# Patient Record
Sex: Female | Born: 1945
Health system: Southern US, Community
[De-identification: ages and names within clinical notes are randomized; demographics above are authoritative.]

## PROBLEM LIST (undated history)

## (undated) DIAGNOSIS — Z9289 Personal history of other medical treatment: Secondary | ICD-10-CM

## (undated) DIAGNOSIS — I10 Essential (primary) hypertension: Secondary | ICD-10-CM

## (undated) DIAGNOSIS — M810 Age-related osteoporosis without current pathological fracture: Secondary | ICD-10-CM

## (undated) DIAGNOSIS — E119 Type 2 diabetes mellitus without complications: Secondary | ICD-10-CM

## (undated) DIAGNOSIS — E7800 Pure hypercholesterolemia, unspecified: Secondary | ICD-10-CM

## (undated) DIAGNOSIS — M549 Dorsalgia, unspecified: Secondary | ICD-10-CM

## (undated) DIAGNOSIS — M1990 Unspecified osteoarthritis, unspecified site: Secondary | ICD-10-CM

## (undated) DIAGNOSIS — Z5189 Encounter for other specified aftercare: Secondary | ICD-10-CM

## (undated) DIAGNOSIS — R9439 Abnormal result of other cardiovascular function study: Secondary | ICD-10-CM

## (undated) DIAGNOSIS — E039 Hypothyroidism, unspecified: Secondary | ICD-10-CM

## (undated) DIAGNOSIS — K449 Diaphragmatic hernia without obstruction or gangrene: Secondary | ICD-10-CM

## (undated) DIAGNOSIS — F419 Anxiety disorder, unspecified: Secondary | ICD-10-CM

## (undated) DIAGNOSIS — I2510 Atherosclerotic heart disease of native coronary artery without angina pectoris: Secondary | ICD-10-CM

## (undated) DIAGNOSIS — M797 Fibromyalgia: Secondary | ICD-10-CM

## (undated) DIAGNOSIS — H269 Unspecified cataract: Secondary | ICD-10-CM

## (undated) DIAGNOSIS — D649 Anemia, unspecified: Secondary | ICD-10-CM

## (undated) DIAGNOSIS — G8929 Other chronic pain: Secondary | ICD-10-CM

## (undated) HISTORY — DX: Personal history of other medical treatment: Z92.89

## (undated) HISTORY — PX: CARPAL TUNNEL RELEASE: SHX101

## (undated) HISTORY — PX: CATARACT EXTRACTION: SUR2

## (undated) HISTORY — PX: EYE SURGERY: SHX253

## (undated) HISTORY — DX: Unspecified cataract: H26.9

## (undated) HISTORY — DX: Abnormal result of other cardiovascular function study: R94.39

## (undated) HISTORY — DX: Diaphragmatic hernia without obstruction or gangrene: K44.9

## (undated) HISTORY — PX: C-EYE SURGERY PROCEDURE: 102257504

## (undated) HISTORY — DX: Fibromyalgia: M79.7

## (undated) HISTORY — PX: ABDOMINAL HYSTERECTOMY: SHX81

## (undated) HISTORY — DX: Type 2 diabetes mellitus without complications: E11.9

## (undated) HISTORY — DX: Essential (primary) hypertension: I10

## (undated) HISTORY — DX: Hypothyroidism, unspecified: E03.9

## (undated) HISTORY — PX: BACK SURGERY: SHX140

## (undated) HISTORY — DX: Unspecified osteoarthritis, unspecified site: M19.90

## (undated) HISTORY — PX: TOTAL HIP REVISION: SHX763

## (undated) HISTORY — PX: COLONOSCOPY: SHX174

## (undated) HISTORY — PX: TONSILLECTOMY: SUR1361

## (undated) HISTORY — DX: Age-related osteoporosis without current pathological fracture: M81.0

## (undated) HISTORY — PX: CHOLECYSTECTOMY: SHX55

---
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
## 2000-07-16 ENCOUNTER — Encounter (HOSPITAL_COMMUNITY): Admission: RE | Admit: 2000-07-16 | Discharge: 2000-08-15 | Payer: Self-pay | Admitting: Rheumatology

## 2001-05-21 ENCOUNTER — Encounter: Payer: Self-pay | Admitting: *Deleted

## 2001-05-21 ENCOUNTER — Ambulatory Visit (HOSPITAL_COMMUNITY): Admission: RE | Admit: 2001-05-21 | Discharge: 2001-05-21 | Payer: Self-pay | Admitting: *Deleted

## 2001-06-03 ENCOUNTER — Encounter (HOSPITAL_COMMUNITY): Admission: RE | Admit: 2001-06-03 | Discharge: 2001-07-03 | Payer: Self-pay | Admitting: Rheumatology

## 2001-07-22 ENCOUNTER — Encounter (HOSPITAL_COMMUNITY): Admission: RE | Admit: 2001-07-22 | Discharge: 2001-08-21 | Payer: Self-pay | Admitting: Rheumatology

## 2001-10-14 ENCOUNTER — Encounter (HOSPITAL_COMMUNITY): Admission: RE | Admit: 2001-10-14 | Discharge: 2001-11-13 | Payer: Self-pay | Admitting: Rheumatology

## 2001-12-16 ENCOUNTER — Encounter (HOSPITAL_COMMUNITY): Admission: RE | Admit: 2001-12-16 | Discharge: 2002-01-15 | Payer: Self-pay | Admitting: Rheumatology

## 2002-04-11 ENCOUNTER — Encounter: Payer: Self-pay | Admitting: Family Medicine

## 2002-04-11 ENCOUNTER — Ambulatory Visit (HOSPITAL_COMMUNITY): Admission: RE | Admit: 2002-04-11 | Discharge: 2002-04-11 | Payer: Self-pay | Admitting: Family Medicine

## 2002-05-12 ENCOUNTER — Encounter (HOSPITAL_COMMUNITY): Admission: RE | Admit: 2002-05-12 | Discharge: 2002-06-11 | Payer: Self-pay | Admitting: Rheumatology

## 2002-06-28 ENCOUNTER — Ambulatory Visit (HOSPITAL_COMMUNITY): Admission: RE | Admit: 2002-06-28 | Discharge: 2002-06-28 | Payer: Self-pay | Admitting: Pulmonary Disease

## 2002-07-04 ENCOUNTER — Ambulatory Visit (HOSPITAL_COMMUNITY): Admission: RE | Admit: 2002-07-04 | Discharge: 2002-07-04 | Payer: Self-pay | Admitting: Pulmonary Disease

## 2002-07-19 ENCOUNTER — Ambulatory Visit (HOSPITAL_COMMUNITY): Admission: RE | Admit: 2002-07-19 | Discharge: 2002-07-19 | Payer: Self-pay | Admitting: Pulmonary Disease

## 2002-07-27 ENCOUNTER — Ambulatory Visit (HOSPITAL_COMMUNITY): Admission: RE | Admit: 2002-07-27 | Discharge: 2002-07-27 | Payer: Self-pay | Admitting: Pulmonary Disease

## 2002-08-25 ENCOUNTER — Encounter (HOSPITAL_COMMUNITY): Admission: RE | Admit: 2002-08-25 | Discharge: 2002-09-24 | Payer: Self-pay | Admitting: Rheumatology

## 2002-09-01 ENCOUNTER — Encounter (HOSPITAL_COMMUNITY): Admission: RE | Admit: 2002-09-01 | Discharge: 2002-10-01 | Payer: Self-pay | Admitting: Rheumatology

## 2002-09-27 ENCOUNTER — Encounter (HOSPITAL_COMMUNITY): Admission: RE | Admit: 2002-09-27 | Discharge: 2002-10-27 | Payer: Self-pay | Admitting: Rheumatology

## 2002-10-07 ENCOUNTER — Encounter (INDEPENDENT_AMBULATORY_CARE_PROVIDER_SITE_OTHER): Payer: Self-pay | Admitting: Internal Medicine

## 2002-10-07 ENCOUNTER — Ambulatory Visit (HOSPITAL_COMMUNITY): Admission: RE | Admit: 2002-10-07 | Discharge: 2002-10-07 | Payer: Self-pay | Admitting: Internal Medicine

## 2002-10-17 ENCOUNTER — Encounter: Payer: Self-pay | Admitting: Orthopedic Surgery

## 2002-10-17 ENCOUNTER — Ambulatory Visit (HOSPITAL_COMMUNITY): Admission: RE | Admit: 2002-10-17 | Discharge: 2002-10-17 | Payer: Self-pay | Admitting: Orthopedic Surgery

## 2002-10-25 ENCOUNTER — Encounter: Payer: Self-pay | Admitting: Orthopedic Surgery

## 2002-10-25 ENCOUNTER — Inpatient Hospital Stay (HOSPITAL_COMMUNITY): Admission: RE | Admit: 2002-10-25 | Discharge: 2002-10-29 | Payer: Self-pay | Admitting: Orthopedic Surgery

## 2002-10-26 ENCOUNTER — Encounter: Payer: Self-pay | Admitting: Orthopedic Surgery

## 2002-12-12 ENCOUNTER — Ambulatory Visit: Admission: RE | Admit: 2002-12-12 | Discharge: 2002-12-12 | Payer: Self-pay | Admitting: Orthopedic Surgery

## 2002-12-12 ENCOUNTER — Encounter: Payer: Self-pay | Admitting: Orthopedic Surgery

## 2003-01-30 ENCOUNTER — Ambulatory Visit (HOSPITAL_COMMUNITY): Admission: RE | Admit: 2003-01-30 | Discharge: 2003-01-30 | Payer: Self-pay | Admitting: Orthopedic Surgery

## 2003-03-09 ENCOUNTER — Other Ambulatory Visit: Admission: RE | Admit: 2003-03-09 | Discharge: 2003-03-09 | Payer: Self-pay | Admitting: Unknown Physician Specialty

## 2003-04-04 ENCOUNTER — Ambulatory Visit (HOSPITAL_COMMUNITY): Admission: RE | Admit: 2003-04-04 | Discharge: 2003-04-04 | Payer: Self-pay | Admitting: Pulmonary Disease

## 2003-10-16 ENCOUNTER — Ambulatory Visit (HOSPITAL_COMMUNITY): Admission: RE | Admit: 2003-10-16 | Discharge: 2003-10-16 | Payer: Self-pay | Admitting: Otolaryngology

## 2003-11-01 ENCOUNTER — Ambulatory Visit (HOSPITAL_COMMUNITY): Admission: RE | Admit: 2003-11-01 | Discharge: 2003-11-01 | Payer: Self-pay | Admitting: Pulmonary Disease

## 2004-04-11 ENCOUNTER — Ambulatory Visit: Payer: Self-pay | Admitting: Orthopedic Surgery

## 2004-04-18 ENCOUNTER — Ambulatory Visit (HOSPITAL_COMMUNITY): Admission: RE | Admit: 2004-04-18 | Discharge: 2004-04-18 | Payer: Self-pay | Admitting: Orthopedic Surgery

## 2004-04-24 ENCOUNTER — Ambulatory Visit: Payer: Self-pay | Admitting: Orthopedic Surgery

## 2004-10-21 ENCOUNTER — Ambulatory Visit: Payer: Self-pay | Admitting: Orthopedic Surgery

## 2005-01-20 ENCOUNTER — Ambulatory Visit: Payer: Self-pay | Admitting: Orthopedic Surgery

## 2005-02-25 ENCOUNTER — Ambulatory Visit (HOSPITAL_COMMUNITY): Admission: RE | Admit: 2005-02-25 | Discharge: 2005-02-25 | Payer: Self-pay | Admitting: Pulmonary Disease

## 2005-06-25 ENCOUNTER — Ambulatory Visit: Payer: Self-pay | Admitting: Orthopedic Surgery

## 2005-07-28 ENCOUNTER — Ambulatory Visit: Payer: Self-pay | Admitting: Orthopedic Surgery

## 2005-09-15 ENCOUNTER — Ambulatory Visit: Payer: Self-pay | Admitting: Orthopedic Surgery

## 2005-10-22 ENCOUNTER — Ambulatory Visit: Payer: Self-pay | Admitting: Orthopedic Surgery

## 2006-01-19 ENCOUNTER — Ambulatory Visit (HOSPITAL_COMMUNITY): Admission: RE | Admit: 2006-01-19 | Discharge: 2006-01-19 | Payer: Self-pay | Admitting: *Deleted

## 2006-01-19 ENCOUNTER — Ambulatory Visit: Payer: Self-pay | Admitting: Orthopedic Surgery

## 2006-02-02 ENCOUNTER — Ambulatory Visit: Payer: Self-pay | Admitting: Orthopedic Surgery

## 2006-03-18 ENCOUNTER — Ambulatory Visit: Payer: Self-pay | Admitting: Orthopedic Surgery

## 2006-04-08 ENCOUNTER — Encounter: Admission: RE | Admit: 2006-04-08 | Discharge: 2006-04-08 | Payer: Self-pay | Admitting: Orthopedic Surgery

## 2006-05-28 ENCOUNTER — Encounter: Admission: RE | Admit: 2006-05-28 | Discharge: 2006-05-28 | Payer: Self-pay | Admitting: Orthopedic Surgery

## 2006-06-29 ENCOUNTER — Encounter: Admission: RE | Admit: 2006-06-29 | Discharge: 2006-06-29 | Payer: Self-pay | Admitting: Orthopedic Surgery

## 2006-07-08 ENCOUNTER — Ambulatory Visit: Payer: Self-pay | Admitting: Orthopedic Surgery

## 2006-07-13 ENCOUNTER — Ambulatory Visit: Payer: Self-pay | Admitting: Gastroenterology

## 2006-07-27 ENCOUNTER — Ambulatory Visit (HOSPITAL_COMMUNITY): Admission: RE | Admit: 2006-07-27 | Discharge: 2006-07-27 | Payer: Self-pay | Admitting: Orthopedic Surgery

## 2006-08-06 ENCOUNTER — Ambulatory Visit: Payer: Self-pay | Admitting: Gastroenterology

## 2006-08-06 ENCOUNTER — Ambulatory Visit (HOSPITAL_COMMUNITY): Admission: RE | Admit: 2006-08-06 | Discharge: 2006-08-06 | Payer: Self-pay | Admitting: Gastroenterology

## 2006-08-06 ENCOUNTER — Encounter (INDEPENDENT_AMBULATORY_CARE_PROVIDER_SITE_OTHER): Payer: Self-pay | Admitting: Specialist

## 2006-08-06 HISTORY — PX: UPPER GASTROINTESTINAL ENDOSCOPY: SHX188

## 2006-08-06 LAB — HM COLONOSCOPY

## 2006-08-18 ENCOUNTER — Inpatient Hospital Stay (HOSPITAL_COMMUNITY): Admission: RE | Admit: 2006-08-18 | Discharge: 2006-08-22 | Payer: Self-pay | Admitting: Orthopedic Surgery

## 2006-08-18 ENCOUNTER — Encounter (INDEPENDENT_AMBULATORY_CARE_PROVIDER_SITE_OTHER): Payer: Self-pay | Admitting: Orthopedic Surgery

## 2006-09-09 ENCOUNTER — Ambulatory Visit: Payer: Self-pay | Admitting: Orthopedic Surgery

## 2006-09-30 ENCOUNTER — Ambulatory Visit: Payer: Self-pay | Admitting: Orthopedic Surgery

## 2007-06-20 ENCOUNTER — Emergency Department (HOSPITAL_COMMUNITY): Admission: EM | Admit: 2007-06-20 | Discharge: 2007-06-20 | Payer: Self-pay | Admitting: Emergency Medicine

## 2007-08-13 ENCOUNTER — Encounter: Payer: Self-pay | Admitting: Orthopedic Surgery

## 2007-09-13 ENCOUNTER — Ambulatory Visit: Payer: Self-pay | Admitting: Orthopedic Surgery

## 2007-09-13 DIAGNOSIS — Z8679 Personal history of other diseases of the circulatory system: Secondary | ICD-10-CM | POA: Insufficient documentation

## 2007-09-13 DIAGNOSIS — M76899 Other specified enthesopathies of unspecified lower limb, excluding foot: Secondary | ICD-10-CM | POA: Insufficient documentation

## 2007-09-20 ENCOUNTER — Encounter: Payer: Self-pay | Admitting: Orthopedic Surgery

## 2007-09-29 ENCOUNTER — Ambulatory Visit: Payer: Self-pay | Admitting: Orthopedic Surgery

## 2007-09-29 ENCOUNTER — Ambulatory Visit (HOSPITAL_COMMUNITY): Admission: RE | Admit: 2007-09-29 | Discharge: 2007-09-29 | Payer: Self-pay | Admitting: Orthopedic Surgery

## 2007-09-29 DIAGNOSIS — M25559 Pain in unspecified hip: Secondary | ICD-10-CM | POA: Insufficient documentation

## 2007-11-02 ENCOUNTER — Encounter: Payer: Self-pay | Admitting: Orthopedic Surgery

## 2007-11-09 ENCOUNTER — Telehealth: Payer: Self-pay | Admitting: Orthopedic Surgery

## 2007-11-11 ENCOUNTER — Encounter: Payer: Self-pay | Admitting: Orthopedic Surgery

## 2007-11-11 ENCOUNTER — Ambulatory Visit (HOSPITAL_COMMUNITY): Admission: RE | Admit: 2007-11-11 | Discharge: 2007-11-11 | Payer: Self-pay | Admitting: Orthopaedic Surgery

## 2007-11-17 ENCOUNTER — Ambulatory Visit: Payer: Self-pay | Admitting: Orthopedic Surgery

## 2007-11-17 DIAGNOSIS — M66259 Spontaneous rupture of extensor tendons, unspecified thigh: Secondary | ICD-10-CM | POA: Insufficient documentation

## 2007-12-13 ENCOUNTER — Telehealth: Payer: Self-pay | Admitting: Orthopedic Surgery

## 2008-01-10 ENCOUNTER — Ambulatory Visit: Payer: Self-pay | Admitting: Orthopedic Surgery

## 2008-02-17 ENCOUNTER — Encounter: Payer: Self-pay | Admitting: Orthopedic Surgery

## 2008-02-17 ENCOUNTER — Encounter: Admission: RE | Admit: 2008-02-17 | Discharge: 2008-02-17 | Payer: Self-pay | Admitting: Orthopaedic Surgery

## 2008-03-15 ENCOUNTER — Encounter: Admission: RE | Admit: 2008-03-15 | Discharge: 2008-03-15 | Payer: Self-pay | Admitting: Orthopedic Surgery

## 2008-06-05 ENCOUNTER — Ambulatory Visit: Payer: Self-pay | Admitting: Orthopedic Surgery

## 2008-06-05 DIAGNOSIS — M51379 Other intervertebral disc degeneration, lumbosacral region without mention of lumbar back pain or lower extremity pain: Secondary | ICD-10-CM | POA: Insufficient documentation

## 2008-06-05 DIAGNOSIS — M5137 Other intervertebral disc degeneration, lumbosacral region: Secondary | ICD-10-CM | POA: Insufficient documentation

## 2008-06-19 ENCOUNTER — Ambulatory Visit (HOSPITAL_COMMUNITY): Admission: RE | Admit: 2008-06-19 | Discharge: 2008-06-19 | Payer: Self-pay | Admitting: Obstetrics & Gynecology

## 2008-06-29 HISTORY — PX: UPPER GASTROINTESTINAL ENDOSCOPY: SHX188

## 2008-08-16 ENCOUNTER — Ambulatory Visit: Payer: Self-pay | Admitting: Orthopedic Surgery

## 2008-08-16 DIAGNOSIS — M25519 Pain in unspecified shoulder: Secondary | ICD-10-CM | POA: Insufficient documentation

## 2008-10-17 ENCOUNTER — Encounter: Payer: Self-pay | Admitting: Orthopedic Surgery

## 2009-02-13 ENCOUNTER — Ambulatory Visit: Payer: Self-pay | Admitting: Orthopedic Surgery

## 2009-03-05 ENCOUNTER — Telehealth: Payer: Self-pay | Admitting: Orthopedic Surgery

## 2009-03-29 ENCOUNTER — Ambulatory Visit (HOSPITAL_COMMUNITY): Admission: RE | Admit: 2009-03-29 | Discharge: 2009-03-29 | Payer: Self-pay | Admitting: Pulmonary Disease

## 2009-04-25 ENCOUNTER — Ambulatory Visit (HOSPITAL_COMMUNITY): Admission: RE | Admit: 2009-04-25 | Discharge: 2009-04-25 | Payer: Self-pay | Admitting: Pulmonary Disease

## 2009-05-12 ENCOUNTER — Inpatient Hospital Stay (HOSPITAL_COMMUNITY): Admission: EM | Admit: 2009-05-12 | Discharge: 2009-05-17 | Payer: Self-pay | Admitting: Emergency Medicine

## 2009-06-08 ENCOUNTER — Ambulatory Visit (HOSPITAL_COMMUNITY): Admission: RE | Admit: 2009-06-08 | Discharge: 2009-06-08 | Payer: Self-pay | Admitting: Pulmonary Disease

## 2010-02-01 ENCOUNTER — Ambulatory Visit: Payer: Self-pay | Admitting: Cardiology

## 2010-02-01 ENCOUNTER — Ambulatory Visit (HOSPITAL_COMMUNITY): Admission: RE | Admit: 2010-02-01 | Discharge: 2010-02-01 | Payer: Self-pay | Admitting: Pulmonary Disease

## 2010-02-01 ENCOUNTER — Encounter (INDEPENDENT_AMBULATORY_CARE_PROVIDER_SITE_OTHER): Payer: Self-pay | Admitting: Pulmonary Disease

## 2010-04-21 ENCOUNTER — Encounter: Payer: Self-pay | Admitting: Pulmonary Disease

## 2010-04-25 ENCOUNTER — Other Ambulatory Visit: Payer: Self-pay | Admitting: Dermatology

## 2010-06-11 ENCOUNTER — Other Ambulatory Visit (HOSPITAL_COMMUNITY): Payer: Self-pay | Admitting: Pulmonary Disease

## 2010-06-11 DIAGNOSIS — R1110 Vomiting, unspecified: Secondary | ICD-10-CM

## 2010-06-12 ENCOUNTER — Ambulatory Visit (HOSPITAL_COMMUNITY)
Admission: RE | Admit: 2010-06-12 | Discharge: 2010-06-12 | Disposition: A | Discharge status: Home / Self Care | Payer: Medicare Other | Source: Ambulatory Visit | Attending: Pulmonary Disease | Admitting: Pulmonary Disease

## 2010-06-12 DIAGNOSIS — K5909 Other constipation: Secondary | ICD-10-CM | POA: Insufficient documentation

## 2010-06-12 DIAGNOSIS — R1110 Vomiting, unspecified: Secondary | ICD-10-CM

## 2010-06-12 DIAGNOSIS — K319 Disease of stomach and duodenum, unspecified: Secondary | ICD-10-CM | POA: Insufficient documentation

## 2010-06-12 DIAGNOSIS — R109 Unspecified abdominal pain: Secondary | ICD-10-CM | POA: Insufficient documentation

## 2010-06-12 DIAGNOSIS — K449 Diaphragmatic hernia without obstruction or gangrene: Secondary | ICD-10-CM | POA: Insufficient documentation

## 2010-06-19 LAB — CBC
Hemoglobin: 10.8 g/dL — ABNORMAL LOW (ref 12.0–15.0)
MCHC: 32 g/dL (ref 30.0–36.0)
RBC: 3.64 MIL/uL — ABNORMAL LOW (ref 3.87–5.11)
RBC: 4.25 MIL/uL (ref 3.87–5.11)
RDW: 23.7 % — ABNORMAL HIGH (ref 11.5–15.5)
WBC: 39 10*3/uL — ABNORMAL HIGH (ref 4.0–10.5)
WBC: 9.3 10*3/uL (ref 4.0–10.5)

## 2010-06-19 LAB — GLUCOSE, CAPILLARY
Glucose-Capillary: 104 mg/dL — ABNORMAL HIGH (ref 70–99)
Glucose-Capillary: 106 mg/dL — ABNORMAL HIGH (ref 70–99)
Glucose-Capillary: 110 mg/dL — ABNORMAL HIGH (ref 70–99)
Glucose-Capillary: 111 mg/dL — ABNORMAL HIGH (ref 70–99)
Glucose-Capillary: 113 mg/dL — ABNORMAL HIGH (ref 70–99)
Glucose-Capillary: 117 mg/dL — ABNORMAL HIGH (ref 70–99)
Glucose-Capillary: 135 mg/dL — ABNORMAL HIGH (ref 70–99)
Glucose-Capillary: 146 mg/dL — ABNORMAL HIGH (ref 70–99)
Glucose-Capillary: 76 mg/dL (ref 70–99)
Glucose-Capillary: 77 mg/dL (ref 70–99)
Glucose-Capillary: 91 mg/dL (ref 70–99)

## 2010-06-19 LAB — CULTURE, BLOOD (ROUTINE X 2)

## 2010-06-19 LAB — DIFFERENTIAL
Basophils Relative: 0 % (ref 0–1)
Eosinophils Absolute: 0.1 10*3/uL (ref 0.0–0.7)
Lymphocytes Relative: 6 % — ABNORMAL LOW (ref 12–46)
Lymphs Abs: 0.6 10*3/uL — ABNORMAL LOW (ref 0.7–4.0)
Lymphs Abs: 1.7 10*3/uL (ref 0.7–4.0)
Monocytes Absolute: 0.6 10*3/uL (ref 0.1–1.0)
Monocytes Relative: 5 % (ref 3–12)
Neutro Abs: 34.5 10*3/uL — ABNORMAL HIGH (ref 1.7–7.7)
Neutrophils Relative %: 88 % — ABNORMAL HIGH (ref 43–77)
WBC Morphology: INCREASED

## 2010-06-19 LAB — BASIC METABOLIC PANEL
Calcium: 7.3 mg/dL — ABNORMAL LOW (ref 8.4–10.5)
Calcium: 7.8 mg/dL — ABNORMAL LOW (ref 8.4–10.5)
Creatinine, Ser: 0.73 mg/dL (ref 0.4–1.2)
Creatinine, Ser: 1.48 mg/dL — ABNORMAL HIGH (ref 0.4–1.2)
GFR calc Af Amer: 43 mL/min — ABNORMAL LOW (ref >60)
GFR calc Af Amer: >60 mL/min (ref >60)
GFR calc non Af Amer: 36 mL/min — ABNORMAL LOW (ref >60)
GFR calc non Af Amer: >60 mL/min (ref >60)
Glucose, Bld: 167 mg/dL — ABNORMAL HIGH (ref 70–99)
Glucose, Bld: 67 mg/dL — ABNORMAL LOW (ref 70–99)
Sodium: 123 mEq/L — ABNORMAL LOW (ref 135–145)

## 2010-06-19 LAB — TSH: TSH: 1.146 u[IU]/mL (ref 0.350–4.500)

## 2010-07-04 ENCOUNTER — Ambulatory Visit (INDEPENDENT_AMBULATORY_CARE_PROVIDER_SITE_OTHER): Payer: Medicare Other | Admitting: Internal Medicine

## 2010-07-04 DIAGNOSIS — R1012 Left upper quadrant pain: Secondary | ICD-10-CM

## 2010-07-04 DIAGNOSIS — R110 Nausea: Secondary | ICD-10-CM

## 2010-07-04 DIAGNOSIS — R1011 Right upper quadrant pain: Secondary | ICD-10-CM

## 2010-07-08 ENCOUNTER — Ambulatory Visit (HOSPITAL_COMMUNITY)
Admission: RE | Admit: 2010-07-08 | Discharge: 2010-07-08 | Disposition: A | Discharge status: Home / Self Care | Payer: Medicare Other | Source: Ambulatory Visit | Attending: Internal Medicine | Admitting: Internal Medicine

## 2010-07-08 ENCOUNTER — Encounter (HOSPITAL_BASED_OUTPATIENT_CLINIC_OR_DEPARTMENT_OTHER): Payer: Medicare Other | Admitting: Internal Medicine

## 2010-07-08 DIAGNOSIS — R112 Nausea with vomiting, unspecified: Secondary | ICD-10-CM

## 2010-07-08 DIAGNOSIS — R109 Unspecified abdominal pain: Secondary | ICD-10-CM

## 2010-07-08 DIAGNOSIS — K5710 Diverticulosis of small intestine without perforation or abscess without bleeding: Secondary | ICD-10-CM | POA: Insufficient documentation

## 2010-07-08 DIAGNOSIS — K449 Diaphragmatic hernia without obstruction or gangrene: Secondary | ICD-10-CM | POA: Insufficient documentation

## 2010-07-08 DIAGNOSIS — K257 Chronic gastric ulcer without hemorrhage or perforation: Secondary | ICD-10-CM | POA: Insufficient documentation

## 2010-07-08 DIAGNOSIS — I10 Essential (primary) hypertension: Secondary | ICD-10-CM | POA: Insufficient documentation

## 2010-07-08 DIAGNOSIS — K259 Gastric ulcer, unspecified as acute or chronic, without hemorrhage or perforation: Secondary | ICD-10-CM

## 2010-07-08 DIAGNOSIS — Z79899 Other long term (current) drug therapy: Secondary | ICD-10-CM | POA: Insufficient documentation

## 2010-07-08 DIAGNOSIS — K267 Chronic duodenal ulcer without hemorrhage or perforation: Secondary | ICD-10-CM | POA: Insufficient documentation

## 2010-07-08 DIAGNOSIS — E119 Type 2 diabetes mellitus without complications: Secondary | ICD-10-CM | POA: Insufficient documentation

## 2010-07-08 DIAGNOSIS — K269 Duodenal ulcer, unspecified as acute or chronic, without hemorrhage or perforation: Secondary | ICD-10-CM

## 2010-07-08 HISTORY — PX: UPPER GASTROINTESTINAL ENDOSCOPY: SHX188

## 2010-07-08 LAB — GLUCOSE, CAPILLARY

## 2010-07-16 NOTE — Op Note (Signed)
NAMEHOLLYN, STUCKY               ACCOUNT NO.:  [id number]  MEDICAL RECORD NO.:  [id number]           PATIENT TYPE:  O  LOCATION:  DAYP                          FACILITY:  [facility]  PHYSICIAN:  Lionel December, M.D.    DATE OF BIRTH:  03-03-1946  DATE OF PROCEDURE:  07/08/2010 DATE OF DISCHARGE:                              OPERATIVE REPORT   PROCEDURE:  Esophagogastroduodenoscopy.  INDICATIONS:  Rebecca Hartman is a 65 year old Caucasian female with history of gastroduodenal ulceration who has been experiencing intermittent nausea and vomiting for the last 6 months.  She has been on double dose PPI.  When she was seen in the office few days ago, we got the impression that she was taking Motrin 800 mg daily.  Today, she says she has not.  She has been experiencing intermittent nausea and vomiting for 6 months.  She was seen by Dr. Juanetta Gosling the primary MD and she had upper GI series which revealed narrowing in the antral region.  She is undergoing diagnostic EGD.  Procedure risks were reviewed with the patient and informed consent was obtained.  MEDICATIONS FOR CONSCIOUS SEDATION: 1. Cetacaine spray for oropharyngeal topical anesthesia. 2. Demerol 50 mg IV. 3. Versed 6 mg IV.  FINDINGS:  Procedure performed in endoscopy suite.  The patient's vital signs and O2 sats were monitored during the procedure and remained stable.  The patient was placed in left lateral recumbent position and Pentax videoscope was passed through oropharynx without any difficulty into esophagus.  Esophagus mucosa of the esophagus normal.  GE junction was located at 31- cm from the incisors and was unremarkable.  Hiatus was at 35.  There was a single long linear erosion at the level of hiatus along the lesser curvature site.  Stomach; it had small amount of bile, but there was no food debris. Stomach distended very well with insufflation.  Folds of the proximal stomach are normal.  Examination of mucosa and body,  antrum, angularis and fundus and cardia was normal, but there was pyloric channel ulcer involving 80% of the circumference and fairly deep and at least about 1- cm long.  However, I was able to pass the scope across without any difficulty.  Duodenum; bulbar mucosa was normal.  The angle of the bulb, there was another circumferential ulcer with narrowing.  The scope was passed across it and during this process was dilated.  There was a diverticulum just past ulcer on the medial side.  Postbulbar mucosa was normal. Endoscope was withdrawn.  The patient tolerated the procedure well.  FINAL DIAGNOSES:  Small sliding hiatal hernia with linear erosion at the level of hiatus.  Pyloric channel ulcer involving 90% of the circumference with noncritical narrowing.  Duodenal ulcer at the angle with narrowing.  This area was dilated with the scope.  Duodenal diverticulum.  RECOMMENDATIONS:  We will check her H. pylori serology today.  Her stains in April 2008 on gastric biopsy were negative.  The patient advised not to take any NSAIDs for now.  Discontinue omeprazole.  We will start on pantoprazole 40 mg p.o. b.i.d. Prescription given for 60 with 5  refills.  I will be contacting the patient with results of blood test and she will return for OV in 4 weeks.  I asked the patient to keep written record of her vomiting spells.     Lionel December, M.D.     NR/MEDQ  D:  07/08/2010  T:  07/09/2010  Job:  [id number]  cc:   Ramon Dredge L. Juanetta Gosling, M.D. Fax: [phone]  Electronically Signed by Lionel December M.D. on 07/16/2010 11:28:32 AM

## 2010-07-16 NOTE — Consult Note (Addendum)
Rebecca Hartman, Rebecca Hartman               ACCOUNT NO.:  [id number]  MEDICAL RECORD NO.:  [id number]            PATIENT TYPE:  LOCATION: [facility]                                FACILITY:  PHYSICIAN:  Lionel December, M.D.    DATE OF BIRTH:  Apr 28, 1945  DATE OF CONSULTATION: 07/04/2010 DATE OF DISCHARGE:                                CONSULTATION   REASON FOR CONSULTATION:  Nausea, abnormal upper GI series.  HISTORY OF PRESENT ILLNESS:  Rebecca Hartman is a 65 year old female, referred to our office by Dr. Juanetta Gosling for nausea.  She states she has been nauseated since early fall.  She recently saw Dr. Juanetta Gosling for her nausea.  She underwent an upper GI with a KUB, which revealed a short segment of stricturing in the distal stomach.  A concentric mass could give this appearance.  Upper endoscopy would likely be helpful in further evaluation, as clinically indicated.  She had a small hiatal hernia.  Lab work on June 12, 2010, her bilirubin was 0.2, which was low, direct 0.1, indirect 0.1, ALP 66, ALT 36, her ALT was 39 which was slightly high, total protein 5.9, and her albumin was 3.9.  CBC on May 14, 2010, her WBC count was 12, RBC 4.54, hemoglobin 11.5, hematocrit 36.5, MCV 80.4, MCH 25.3, platelets 403.  Her hemoglobin A1c was 6.1.  In April 2010, Rebecca Hartman underwent an EGD.  She was  found to have a low hemoglobin.  She underwent EGD at [facility] by Dr. Karilyn Cota.  The EGD revealed a healing bulbar ulcer, which is possibly the source of the patient's GI blood loss.  She had a pyloric channel inflammation with a prepyloric scar and a small sliding hiatal hernia.  Also, interestingly she underwent an EGD in May 2008 by Dr. Karilyn Cota, which revealed a prepyloric channel ulcer.  Biopsies were obtained.  She also had a somewhat narrowed pylorus, but the diagnostic gastroscope was able to pass without resistance.  She had a normal esophagus without evidence of Barrett  ulcerations or erosions.  She had a normal duodenum and second portion of the duodenum.  Rebecca Hartman is also complaining of bloating and abdominal distention.  Her nausea occurs about every other day.  At times, she will vomit.  There has been no early satiety.  There has been no weight loss.  No dysphagia.  Her appetite has remained okay.  She usually has a bowel movement about twice a week.  ALLERGIES:  She is allergic to SEAFOOD and MORPHINE.  HOME MEDICATIONS:  Include: 1. Hydrocodone rarely. 2. Omeprazole 20 mg twice a day. 3. Hydroxyzine 50 mg three times a day. 4. GenTeal eyedrops. 5. Mucinex-D as needed. 6. Metformin one twice a day 500 mg. 7. Nasal spray as needed. 8. Motrin 800 mg once a day. 9. Lorazepam 1 mg as needed. 10.Lasix 40 mg a day. 11.Gabapentin 600 mg 3 times a day. 12.Diltiazem 120 mg b.i.d. 13.Metoprolol 50 mg b.i.d. 14.Phillips stool softener daily.  SURGERY:  She has had a tonsillectomy, laparoscopy for infertility.  She had a cyst removed from her left hand.  She has had a hysterectomy, cholecystectomy.  She had a total hip replacement, right thumb arthroplasty, left carpal tunnel.  She has had back surgery.  She has a history of diabetes type 2, hypertension, and fibromyalgia.  In October 2011, she had a bilateral upper lid blepharoplasty.  FAMILY HISTORY:  Her mother deceased from a CVA.  Her father is deceased from a CVA.  She has 1 brother alive with prostate cancer and a history of COPD.  SOCIAL HISTORY:  She is married.  She is employed with [employer] She does not smoke, drink, or do drugs.  She has 1 child in good health.  OBJECTIVE:  VITAL SIGNS:  Her weight is 182, height 5 feet 4.5 inches, temperature 97.6, blood pressure 200/86, pulse 80. HEENT:  She has natural teeth.  Her oral mucosa is moist.  There are no lesions.  Her conjunctivae is pink.  Her sclerae are anicteric.  Her thyroid is normal.  There is no cervical  lymphadenopathy. LUNGS:  Clear. HEART:  Regular rate and rhythm. ABDOMEN:  Soft.  It may be just slightly distended.  It is hard to say. I do not feel any masses.  She does have slight tenderness to the epigastric region.  ASSESSMENT:  Rebecca Hartman is a 65 year old female, presenting with nausea and an abnormal upper gastrointestinal series, which reveals a short segment of stricturing in the distal stomach, a concentric mass could give this appearance.  A gastric carcinoma does need to be ruled out.  RECOMMENDATIONS:  We will schedule an EGD with Dr.Numan Zylstra in the near future.  The risk and benefits were reviewed with the patient and she is agreeable.    ______________________________ Dorene Ar, NP   ______________________________ Lionel December, M.D.    TS/MEDQ  D:  07/04/2010  T:  07/05/2010  Job:  [id number]  Electronically Signed by Dorene Ar PA on 08/15/2010 04:13:02 PM Electronically Signed by Lionel December M.D. on 08/18/2010 07:23:46 PM

## 2010-08-06 ENCOUNTER — Ambulatory Visit (INDEPENDENT_AMBULATORY_CARE_PROVIDER_SITE_OTHER): Payer: Medicare Other | Admitting: Internal Medicine

## 2010-08-06 DIAGNOSIS — K270 Acute peptic ulcer, site unspecified, with hemorrhage: Secondary | ICD-10-CM

## 2010-08-13 NOTE — Op Note (Signed)
NAMEJORDYNE, Rebecca Hartman NO.:  [id number]   MEDICAL RECORD NO.:  [id number]          PATIENT TYPE:  INP   LOCATION:  2899                         FACILITY:  [facility]   PHYSICIAN:  Nelda Severe, MD      DATE OF BIRTH:  December 26, 1945   DATE OF PROCEDURE:  08/18/2006  DATE OF DISCHARGE:                               OPERATIVE REPORT   SURGEON:  Nelda Severe, M.D.   ASSISTANT:  Lianne Cure, P.A.-C.   PREOPERATIVE DIAGNOSIS:  L4-5 spondylosis/spinal stenosis, L4-5 right  disk herniation.   POSTOPERATIVE DIAGNOSIS:  L4-5 spondylosis/spinal stenosis, L4-5 right  disk herniation.   OPERATIVE PROCEDURE:  1. L4-5 right-sided disk excision.  2. Bilateral L4-5 laminectomies and facetectomies.  3. Posterior interbody fusion, L4-5.  4. Posterolateral fusion L4-L5.  5. Segmental fixation (Theken screws and rods) L4-5.  6. Harvest right posterior iliac crest graft.  7. Aspiration left posterior iliac crest graft for bone marrow.   OPERATIVE NOTE:  The patient was placed under general endotracheal  anesthesia.  Foley catheter was placed in the bladder.  Sequential  compression devices were attached to both lower extremities.  A Foley  catheter was placed the bladder.  A gram of vancomycin had just infused.   The patient was positioned prone on a Jackson frame.  The upper  extremities were positioned so as to avoid hyperflexion and abduction of  the shoulders and so as to avoid hyperflexion of the elbows.  The upper  extremities were padded with foam from axilla to hands.  The thighs,  knees, shins and ankles were supported on pillows.   The lumbar area was prepped with DuraPrep and draped in rectangular  fashion.  Prior to this, the proposed incisions had been marked with a  skin marker over the midline of the lower lumbar spine and over the  right posterior iliac crest for graft harvest.   Having secured the drapes with Ioban, the skin was scored in line with  the  midline incision and  centered what was to be perceived to be L4-5.  The subcutaneous tissue was then injected with a mixture of quarter  percent Marcaine with epinephrine and 1% lidocaine.  Incision was  carried down to the spinous process tips.  The paraspinal muscles were  mobilized bilaterally to expose the transverse processes to their tips  at L4 and L5.  Cross-table lateral radiograph was taken with a Kocher  clamp on L5.  This confirmed our motion of last mobile segment and the  second last mobile segment, upon which we were operating.   We then placed pedicle holes bilaterally at L4-L5.  This was done in the  same fashion at each pedicle:  A piece of the base of the superior  articular process was removed with a Leksell rongeur.  The posterior  pedicle was perforated with an awl.  A pedicle probe was then used to  make a hole through the pedicle into the vertebral body.  A hole was  sounded for depth and the depths recorded.  Each hole was  circumferentially palpated to make sure  that there were no breaches.  Radiopaque markers were placed in each hole after injection of the hole  with FloSeal.  Cross-table lateral radiographs showed satisfactory  position of the markers.   Next, we performed a laminectomy bilaterally at L4-5.  Both inferior  articular processes were removed at L4.  This was done primarily with an  osteotome in order to harvest the maximum quantity of local bone.  We  then completed the laminectomy proximally on the right side and then  removed L5 lamina distally on the right side to a point of about even  with the midpoint of the L5 pedicle.  Next, we removed the ligamentum  flavum on the right side throughout the area of the laminectomy exposing  the dura.  We then mobilized the dura and origins of the L5 nerve root  medially.  Many epidural veins were coagulated including those in the  neural foramen.  The exiting L5-4 nerve root above was protected by   interposing a cottonoid patty between the nerve root and the lateral  portion of the L4-5 disk.  We then fenestrated the disk and began the  process of disk excision.  There was a large amount of degenerate  nucleus pulposus which appeared scarred to the overlying posterior  longitudinal ligament centrally, and there was a extruded fragment  (actually multiple very small fragments) on the right side adjacent to  the L5 pedicle, completely covered by the posterior longitudinal  ligament.  These were removed with a combination of nerve hook, curette,  pituitary rongeur.   We then able to place a disk space curette in the disk space and used it  to distract the disk space slightly.   Next, in preparation for the fusion, we performed a right posterior  iliac crest bone graft harvest.  This was done by making a vertical  incision just lateral to the crest and then exposing area of the  superficial surface of the crest, and fenestrating it.  We then used a  disposable bone graft harvesting cannula to remove cores of bone, and  subsequently further bone was removed using curettes and gouges.  This  was done from between the two tables of the ilium.  No muscle was  detached from the ilium.  When as much graft as could be harvested had  been, we irrigated the iliac crest with saline and then packed Gelfoam  into the bony defect.   Having mixed the harvested iliac crest bone and the local bone together,  I felt there was not an adequate volume.  Therefore, 15 mL of beta-  tricalcium phosphate was mixed in with the bone, and as well about 15 mL  of bone marrow was aspirated through an 18-gauge spinal needle from the  left posterior iliac crest.   Next, we decorticated the transverse processes at L4 and L5 on the right  side and packed the mixture of autograft and beta-tricalcium phosphate  between the transverse processes.  Next, we placed 6.5-mm Theken screws,  45 mm in length, at L4 and L5.   The holes were pretapped prior to  placing screws.  The screws were then tested by stimulating them with  electrical current and measuring the current necessary to stimulate  muscle activity distally.  In all instances, this was in a safe zone,  meaning there was little likelihood of contact between screw thread and  nerve root.  We then placed a rod provisionally between the two screws.  We  then placed the Love nerve root retractor again and retracted the  dura medially.  A disk space, intradiskal distractor was then inserted  and turned so the disk space was distracted 8 mm.  The set screws and  the rods were then locked at amount of distraction to hold the disk  space part.   Further disk excision was carried out, and we curetted the endplate  cartilage right and left and anteriorly.  When I felt that we had  removed all possible nucleus material and curetted the endplate  satisfactorily, a special funnel device was placed in the disk space,  and then bone graft, the mixture described above, was then packed  anteriorly into the disk space.  When a sufficient quantity of bone had  been packed into the disk space anteriorly, the funnel was removed, and  an 8-mm kidney-shaped cage was packed with graft and impacted into  place.  The set screws were then released on the provisional distraction  rod.   We then went to the patient's left side.  The transverse processes at L4-  L5 were decorticated and the graft mixture placed there.  We then placed  screws at L4 and L5.  Once again, they were stimulated, and once again,  the results were satisfactory.  We then attached a straight rod between  the screws and provisionally attached.  Cross-table lateral radiograph  showed satisfactory position of the screws.   The provisional rod on the right side was then changed out to a new rod.  All of the couplings were torqued.  The remaining graft was then packed  posterolaterally on both right and  left sides.   We then debrided some devitalized-looking muscle.  A #19-gauge Blake  drain was placed subfascially and secured to the skin at its exit point  using a 2-0 nylon suture in basket-weave fashion.  The lumbar fascia was  then closed using continuous #1 Vicryl suture.  A 1/8-inch Hemovac drain  was then placed from the central wound subcutaneously through the bone  graft harvest wound subcutaneously and out through the skin proximally.  It was secured also with a 2-0 nylon suture in a basket-weave fashion.  In the subcutaneous layer, both wounds were closed using interrupted  inverted 2-0 Vicryl suture.  The skin of both wounds was closed using  continuous undyed 3-0 Vicryl suture in subcuticular continuous fashion.  The skin edges were reinforced with Steri-Strips.  Antibiotic ointment  and dressings were applied and secured with OpSite.   The patient was then placed in her bed.  There were no intraoperative  complications.  The sponge and needle counts were correct.  The blood  loss estimated 500 mL, the patient received 200 mL of Cell Saver blood  back.   At the time of dictation, the patient has not yet been examined  postoperatively, so no examination is reported here.      Nelda Severe, MD  Electronically Signed     MT/MEDQ  D:  08/18/2006  T:  08/18/2006  Job:  [id number]

## 2010-08-13 NOTE — Op Note (Signed)
Rebecca Hartman, Rebecca Hartman               ACCOUNT NO.:  [id number]   MEDICAL RECORD NO.:  [id number]          PATIENT TYPE:  AMB   LOCATION:  DAY                           FACILITY:  [facility]   PHYSICIAN:  Kassie Mends, M.D.      DATE OF BIRTH:  Sep 01, 1945   DATE OF PROCEDURE:  08/06/2006  DATE OF DISCHARGE:                               OPERATIVE REPORT   REFERRING PHYSICIAN:  Oneal Deputy. Juanetta Gosling, M.D.   PROCEDURE:  1. Colonoscopy.  2. Esophagogastroduodenoscopy with cold forceps biopsy.   INDICATION FOR EXAM:  Rebecca Hartman is a 65 year old female with a positive  endomysial antibody.  She is having the EGD performed to evaluate for  celiac sprue.  She also uses arthritis BC once a week.  She had been  complaining of right side pain since January2008. She has complained  of rectal bleeding after intercourse.   FINDINGS:  1. Normal colon without evidence of polyps, masses, inflammatory      changes, diverticula or AVMs.  No internal hemorrhoids.  2. Prepyloric channel ulcer.  Biopsies obtained via cold forceps.  Six      biopsies obtained in the periphery and two in the central area of      the ulcer.  No visible vessel seen in the central ulcer.  No      stigmata of bleeding.  Biopsies also obtained in the second portion      of the duodenum to evaluate for celiac sprue.  3. She also had a somewhat narrow pylorus, but the diagnostic      gastroscope was able to pass without resistance.  4. Normal esophagus without evidence of Barrett's ulcerations or      erosions.  5. Normal duodenum and second portion of the duodenum.   RECOMMENDATIONS:  1. Avoid gastric irritants.  No aspirin or anti-inflammatory drugs for      8 to 12 weeks.  2. Begin omeprazole 20 mg daily.  3. No source for rectal bleeding found.  Will begin empiric treatment      for hemorrhoids.  She is given a prescription for Anusol HC one per      rectum twice daily for 14 days.  She is asked to begin Colace 1 mg      twice  daily for 1 month.  She has a follow-up appointment to see me      in 1 month.  4. Will call her with results of her biopsies.  She is also asked to      begin a low-fat high-fiber diet.  She is given a handout on low-fat      diet high fiber diet and hemorrhoids.  5. If her rectal bleeding persists, I will refer to Dr. Franky Macho      for a full evaluation of her anal canal.   MEDICATIONS:  1. Demerol 100 mg IV.  2. Versed 8 mg IV.  3. Phenergan 12.5 mg IV.   PROCEDURE TECHNIQUE:  Physical exam was performed.  Informed consent was  obtained from the patient explaining benefits, risks and alternatives to  procedure.  The patient connected to monitor and placed in left lateral  position.  Continuous oxygen was provided by nasal cannula and IV  medicine administered through an indwelling cannula.  After  administration of sedation and rectal exam, the patient's rectum was  intubated.  The scope was advanced under direct visualization to the  cecum.  The scope was subsequently removed slowly by carefully examining  the color, texture, anatomy and integrity of mucosa on the way out.   After the colonoscopy, the patient's esophagus intubated with a  diagnostic gastroscope and scope was advanced under direct visualization  to the second portion of the duodenum.  The scope was subsequently  removed slowly by carefully examine the color, texture, anatomy  integrity of mucosa on the way out.  The patient was recovered in  endoscopy and discharged home in satisfactory condition.      Kassie Mends, M.D.  Electronically Signed     SM/MEDQ  D:  08/06/2006  T:  08/06/2006  Job:  [id number]   cc:   Ramon Dredge L. Juanetta Gosling, M.D.  Fax: [phone]   Nelda Severe, MD  Fax: [phone]

## 2010-08-13 NOTE — H&P (Signed)
NAMEJALONI, Rebecca Hartman NO.:  [id number]   MEDICAL RECORD NO.:  [id number]          PATIENT TYPE:  INP   LOCATION:  NA                           FACILITY:  [facility]   PHYSICIAN:  Nelda Severe, MD      DATE OF BIRTH:  Sep 03, 1945   DATE OF ADMISSION:  DATE OF DISCHARGE:                              HISTORY & PHYSICAL   CHIEF COMPLAINT:  Lumbar pain, right leg pain, stays above the knee into  the thigh.   PRESENT ILLNESS:  Lumbar spondylosis, lumbar stenosis, axial back pain.   DRUG ALLERGIES:  INCLUDE:  1. SKELAXIN.  2. SEAFOOD.   She has a medication list that I am going to include in her chart, which  includes medication for hypertension, arthritis, anemia, ulcers, thyroid  problems and includes:  1. Levothyroxine 50 mg once a day.  2. Cardizem LA 120 mg twice a day.  3. Metoprolol 25 mg 2 times a day.  4. Lisinopril 20 mg once a day.  5. Furosemide 40 mg once a day.  6. Provigil 20 mg once a day.  7. Neurontin 300 mg twice a day.  8. Lexapro 10 mg once a day.  9. Estradiol 0.5 mg daily.  10.Crestor 10 mg daily.  11.Xyzal 5 mg daily.  12.Ambien CR 12.5 mg daily.  13.Ativan 1 mg daily.  14.Lortab 10/500 every 4, alternated with propoxyphene N 100.   PAST SURGICAL HISTORY:  Includes:  1. Tonsillectomy in 1967.  2. Laparoscopy in 1974.  3. Cysts from left hand in 1990.  4. Hysterectomy in 1997.  5. Cholecystectomy in 2001.  6. Total hip replacement on the right in 2004.  7. Right CNC arthroplasty in 2005.  8. Left carpal tunnel in 2006.   DOCTOR LIST:  Includes:  1. A Dr. Kari Baars at [address] in [place].  His      telephone number is [phone].  2. Dr. Ty Hilts in [place], number [phone].  3. Dominica Severin at [facility] is her hand physician, [phone].  4. Dr. Dani Gobble in [place], telephone number is [phone] is at      [facility].   She does have a pending  cardiac stress test that was done preoperatively  and they are going to send [place] the results and also the short stay.   FAMILY HISTORY:  Includes a stroke, mother.  Father has heart trouble.  Mother hypertension.  Mother diabetes and father with arthritis and  brother with cancer, as well as lung disease brother.   REVIEW OF SYSTEMS:  She reports no fevers, no chills, no night sweats.  No nausea, vomiting or diarrhea.  She does wear reading glasses.  She  states she has an abnormal heartbeat.  She has a stomach ulcer that is  being treated.  She recently had an endoscopy.  She has constipation,  alternating with frequent loose stools.  She has history of hemorrhoids.  Frequent urination.  Burning with urination.  Frequent urinary tract  infections.  Frequent rash, she thinks secondary  to pain medication and  she takes a topical lotion for itching, as well as occasional Benadryl.  She reports having hot and cold spells, postmenopausal and troubles  sleeping.   VITAL SIGNS:  Her temperature is 97.6.  Blood pressure is 120/75.  Pulse  of 73.  GENERAL:  She appears well-developed, well-nourished.  HEENT:  Pupils are equal, round and reactive to light.  NECK:  Supple.  No adenopathy.  Full range of motion.  CHEST:  Clear to auscultation.  No wheezes noted.  HEART:  Regular rate and rhythm.  No murmur noted today.  ABDOMEN:  Soft, nontender to palpation.  She has positive bowel sounds  on auscultation.  SKIN:  Clean and dry.  She does have bilateral upper extremities  surrounding the elbow area rash, as well as an occasional rash on her  lower extremities near the knees.  EXTREMITY EXAM:  Right lower extremity is positive for pain to the  posterior aspect of the thigh.  She has a right antalgic gait.  She has  right buttock and posterior thigh pain with lumbar pain.   DIAGNOSIS:  Lumbar spondylosis, stenosis at L4-5.   PLAN:  Lumbar laminectomy fusion L4-5 with an iliac crest bone  graft.      Lianne Cure, P.A.      Nelda Severe, MD  Electronically Signed    MC/MEDQ  D:  08/14/2006  T:  08/15/2006  Job:  [id number]

## 2010-08-13 NOTE — Discharge Summary (Signed)
NAMEBOSTON, Rebecca NO.:  [id number]   MEDICAL RECORD NO.:  [id number]          PATIENT TYPE:  INP   LOCATION:  5021                         FACILITY:  [facility]   PHYSICIAN:  Lianne Cure, P.A.  DATE OF BIRTH:  1945/09/28   DATE OF ADMISSION:  08/18/2006  DATE OF DISCHARGE:  08/22/2006                               DISCHARGE SUMMARY   PREOPERATIVE DIAGNOSES:  Lumbar 4-5 spondylosis spinal stenosis with  disc herniation to the right.   POSTOPERATIVE DIAGNOSES:  Lumbar 4-5 spondylosis spinal stenosis to  Lumber 4-5 right disc herniation, status post posterior laminectomy,  interbody fusion with ileac crest bone graft.   She was taken to the OR by Dr. Nelda Severe on Aug 18, 2006.  Postoperatively, she was stable.  In postoperative care unit, she had  active range of motion of bilateral lower extremities.  Stated her right  leg did feel somewhat better.  She had blood loss of 500 mL.  Her  hemoglobin was low postoperatively at 7.5.  We transfused 2 units of  packed red blood cells.  Post-op day 1, her hemoglobin was at 9.2, white  count 12.2, electrolytes stable, drain output 200+.  Calves were soft,  nontender to palpation, active range of motion, and palpable pulses were  intact and equal.  We did increase her IV fluid to 50 cc, advanced her  diet.  Discontinued Norco for pain and changed it to Darvocet-N 100 with  PCA.  The Norco were making her itch.   Post-op day 2, the patient was comfortable.  Eating a regular diet well.  Itching had subsided.  She reports no shortness of breath, no chest  pain.  She was afebrile.  Saturations 99% on 2 liters O2.  Drain output  200+.  Distally, bilateral lower extremities were neurovascularly and  motor intact.  We had blocked her IV, discontinued the Foley, as well as  PCA.   Post-op day 3, the patient was doing well.  She had gotten out of the  bed.  She feels like she is starting a sinus infection, which she gets  frequently and knows how it makes her body feel.  The nurse on duty had  called the pharmacy to check what her medications were for sinus  infection treatment in the past, and we started that medication, which  includes Entex-PSE for congestion 1 tablet q.12 and ampicillin 500 mg 1  every 8 hours for 7 days.  Distally, bilateral lower extremities  neurovascularly and motor intact.  Drains were discontinued.  Dressing  was changed, incisions appear to be healing well.  No active drainage.  No erythema.   Post-op day 4, today the patient is feeling much better as far as her  sinuses.  She is ambulating in her room independently with a running  walker.  She is bed mobility independent with a standby of her husband.  She states she is ready to go home.  She is feeling comfortable.  She is  afebrile.  Vital signs are stable.  Distally, bilateral lower  extremities are neurovascularly and motor intact, L4-S1  intact and equal  sensation.  She states her right leg pain is better from prior to  surgery.  Incision sites clean, dry and intact.  She has minimal  drainage of where the [facility] drain was discontinued.  I placed a clean,  dry dressing there.   DIAGNOSIS:  Status post lumbar fusion, lumbar 4 - lumbar 5 with ileac  crest bone graft and laminectomy.   DIET:  Regular.   DISPOSITION:  Stable.   PLAN:  She is going to follow up with Dr. Nelda Severe in 4 weeks from  surgery.  Walking for exercise.  No bending, stooping, lifting.  She may  shower.  Want her to change the dressing once a day until there is no  drainage.  If she has any troubles or concerns, she is welcome to call  us any time.   DISCHARGE MEDICATIONS:  Continue on previous regular medications.  Also,  I am sending her home on:  1. Entex-PSE for congestion 1 every 12 hours as needed.  2. Ampicillin 500 mg 1 every 8 hours for 5 more das.  3. Darvocet-N 100 one every 6 hours p.r.n. for pain count of 100 with      a  refill.      Lianne Cure, P.A.     MC/MEDQ  D:  08/22/2006  T:  08/22/2006  Job:  [id number]

## 2010-08-16 NOTE — Discharge Summary (Signed)
NAME:  Rebecca Hartman, Rebecca Hartman                         ACCOUNT NO.:  [id number]   MEDICAL RECORD NO.:  [id number]                   PATIENT TYPE:  INP   LOCATION:  A331                                 FACILITY:  [facility]   PHYSICIAN:  Vickki Hearing, M.D.           DATE OF BIRTH:  October 01, 1945   DATE OF ADMISSION:  10/25/2002  DATE OF DISCHARGE:  10/29/2002                                 DISCHARGE SUMMARY   ADMISSION DIAGNOSIS:  Avascular necrosis right hip.   DISCHARGE DIAGNOSES:  1. Avascular necrosis right hip.  2. Postoperative anemia.   PROCEDURE:  Right total hip arthroplasty on October 25, 2002.   SURGEON:  Vickki Hearing, M.D.   IMPLANT:  [employer] Pressfit total hip replacement system with a 32  mm head, size 812 stem, size 52 acetabular cup.   MEDICAL HISTORY:  Ms. Fischel is a 65 year old female who presented to me  with right leg and hip pain which she had had for approximately 4 months.  She was worked up with MRI of the spine and hip, and radiographs; and was  found to have an avascular necrosis of the right hip along with a  degenerative condition of the lumbar spine.  Due to her persistent groin  pain and decreasing ability to walk she was admitted for a right total hip  replacement.   HOSPITAL COURSE:  After admission the patient went directly to surgery.  She  had an uncomplicated total hip arthroplasty at which time there was  significant avascular necrosis of the right femoral head.  There was  hypertrophic, friable, capsular overgrowth.  There was shortening of the  right lower extremity.  She tolerated this reasonably well and was brought  to the floor and placed on a PCA pump.  I started her on a routine total hip  protocol which included DVT prevention, early ambulation, supportive care  and monitoring of her hemoglobin.  Her hemoglobin did drop and in the  recovery room she had 2 units of blood. She had a hemoglobin at the time of  discharge of  9.1.   She progressed well with physical therapy and was ready for discharge with a  stable wound.  She was afebrile.  Her groin pain had been eliminated. She  had some residual right lower extremity pain believed to be related to her  degenerative joint disease.  She had some mild right knee pain.   During the postoperative period she had an x-ray of her hip and knee.  The  knee x-ray was normal.  The hip x-ray showed good position of the  prosthesis.   DISCHARGE DISPOSITION:  To home.   DISCHARGE CONDITION:  Improved.   Supportive care at home will include registered nurse visits and subcu  Lovenox for the balance of 2 weeks.   DISCHARGE MEDICATIONS:  1. She will be discharged on her regular home medications plus  2. Lorcet  10 650.  3. Hemacyte 1 twice daily.  4. Skelaxin 400 mg 2 tablets q.6h. p.r.n.   Follow up visit on Wednesday, August 4.  Her staples can come out Wednesday,  August 11.  She will also have home physical therapy and occupational  therapy.  Her weight bearing status is full with walker for 6 weeks.  Her  hip precautions are anterior hip precautions.                                               Vickki Hearing, M.D.    SEH/MEDQ  D:  10/29/2002  T:  10/29/2002  Job:  [id number]

## 2010-08-16 NOTE — Consult Note (Signed)
NAMEMarland Kitchen  AVAREY, YAEGER NO.:  [id number]   MEDICAL RECORD NO.:  [id number]                   PATIENT TYPE:   LOCATION:                                       FACILITY:   PHYSICIAN:  Aundra Dubin, M.D.            DATE OF BIRTH:   DATE OF CONSULTATION:  DATE OF DISCHARGE:                                   CONSULTATION   CHIEF COMPLAINT:  Fibromyalgia, right hand.   HISTORY OF PRESENT ILLNESS:  The patient returns with a great deal of active  issues.  She tells me that she has had a terrible month of August and spent  much time in bed.  She saw Dr. Regino Schultze, who gave her Lorcet to help with the  pain.  She is aching in her hips, back, neck, and has the arthritis to the  right thumb, first CMC area.  She has questions about estrogen versus  Estratest, as to which is better with fibromyalgia.  She describes a skin  rash to her fingers primarily that she thinks is psoriasis.  She does have  some swelling of the DIP's.  Her weight is stable.  There has been no fever  or rash or cough.  She discusses with me the cost of Ambien, which is quite  high.  She brings vitamins described as mega vitamins and has questions  about this.   MEDICATIONS:  1. Ambien 5-10 mg h.s.  2. Lotrel 5/20 q.d.  3. Premarin 0.625 mg three times a week.  4. HCTZ q.d.  5. Prozac 20 mg q.d.  6. Relafen 1000 mg q.d.  7. Claritin 1 p.r.n.  8. Lorcet 1 q.d.  9. Mylanta h.s.   PHYSICAL EXAMINATION:  VITAL SIGNS:  Weight 145 pounds, blood pressure  118/78, respirations 14.  LUNGS:  Clear.  HEART:  Regular.  No murmur.  EXTREMITIES:  Lower extremities:  No edema.  MUSCULOSKELETAL:  She has degenerative type nodularity to the DIP's.  The  PIP's are nonswollen.  The right MCP's appear swollen and may be slightly  tender.  The first [facility] is swollen and tender.  Wrist, elbows, shoulders good  range of motion.  Left greater than right trochanteric bursa is tender.  The  knees are  cool and flex well.  Ankles and feet were nontender.   ASSESSMENT AND PLAN:  1. Fibromyalgia.  She continues to have significant flaring.  Primarily,     because of cost reasons of Ambien,  I will try her on Zanaflex 4 mg.  She     can take 1-2 tablets a night.  We will see if this helps more than the     Ambien and if it is worth the cost.  2. Osteoarthritis.  I will have her evaluated by a hand orthopedist to see     if surgery is warranted for the first [facility].  3. Hip pain.  She may have a trochanteric bursa but will stretch it at this     time.  I will give some consideration to possibly injecting the left     side.  4. Knee pain.  My exam was normal.  She had complaints that I did not     mention in the above narrative paragraphs.  5. Skin rash.  I will have her evaluated by Dr. Rennie Plowman to put a better     diagnosis on her rash.   She will return in three months.                                                 Aundra Dubin, M.D.    WWT/MEDQ  D:  12/16/2001  T:  12/17/2001  Job:  [id number]   cc:   Kirk Ruths, M.D.  [address]  [place]  [place] [address]  Fax: [phone]

## 2010-08-16 NOTE — Consult Note (Signed)
NAME:  Rebecca Hartman, Rebecca Hartman                         ACCOUNT NO.:  [id number]   MEDICAL RECORD NO.:  [id number]                   PATIENT TYPE:   LOCATION:                                       FACILITY:   PHYSICIAN:  Aundra Dubin, M.D.            DATE OF BIRTH:  1945/05/27   DATE OF CONSULTATION:  05/12/2002  DATE OF DISCHARGE:                                   CONSULTATION   CHIEF COMPLAINT:  Fibromyalgia, hands.   REASON FOR CONSULTATION:  The patient reports that she did use the Zanaflex  one time and because this dropped her blood pressure she did not use it  afterwards.  She has now been placed on Elavil by other providers.  She saw  Dr. Richardson Landry in [place] to evaluate her hand situation.  She underwent  nerve conduction studies which showed she has worse carpal tunnel signs on  the left more than the right.  The right hand continues to be the more  problematic and painful hand.  She is aching a great deal overall.  Her left  lateral hip hurts.  Her weight is up 10 pounds.  There is no polyuria or  polydipsia.  She aches around the shoulders and neck.  There has been no  fever, cough, shortness of breath.  She does have some headaches.   MEDICINES:  1. Off Ambien.  2. Ativan 0.5-1 mg h.s. p.r.n.  3. Elavil 25 mg daily.  4. Lorcet one daily.  5. Hydrochlorothiazide daily.  6. Prozac 20 mg daily.  7. Lodine 400 mg b.i.d.  8. Estrace 0.5 mg daily.   PHYSICAL EXAMINATION:  VITAL SIGNS:  Weight 155 pounds, blood pressure  128/70, respirations 18.  GENERAL:  She appears tired.  LUNGS:  Clear.  NECK:  Negative JVD.  HEART:  Regular.  LOWER EXTREMITIES:  No edema.  MUSCULOSKELETAL:  She has a degenerative-type squaring at the first CMCs.  She has a positive Tinel's sign to the right hand.  Elbows, shoulders, neck  good range of motion with mild stiffness.  Trigger points around the  shoulder, neck, occiput, and upper paraspinous muscles were tender.  Knees,  ankles,  and feet are nontender.  The left trochanteric bursa is quite  tender.   PROCEDURE:  Right carpal tunnel injection.  The volar wrist was cleaned with  multiple alcohol swabs and cooled with ethyl chloride.  Using a five-eighths  inch 25-gauge needle, 40 mg of Depo-Medrol and a small amount of lidocaine  was injected.   ASSESSMENT AND PLAN:  1. Fibromyalgia.  She continues to have significant symptoms concerning     this.  She is on numerous medicines for this and I will not add further     than the ones listed above.  2. Osteoarthritis of the hands.  She is on Lodine.  3. Carpal tunnel symptoms.  We will see if the simple injection as  above     will help.  4. Left trochanteric bursitis.  I explained how to stretch this and     demonstrated it with her.  She will try this and if it is not helping I     would consider an injection.   She will return in four months.                                                Aundra Dubin, M.D.    WWT/MEDQ  D:  05/12/2002  T:  05/12/2002  Job:  [id number]   cc:   Kirk Ruths, M.D.  [address]  [place]  [place] [address]  Fax: [phone]

## 2010-08-16 NOTE — H&P (Signed)
NAMEMarland Hartman  MANUELLA, BLACKSON NO.:  [id number]   MEDICAL RECORD NO.:  [id number]                    PATIENT TYPE:   LOCATION:                                       FACILITY:   PHYSICIAN:  Vickki Hearing, M.D.           DATE OF BIRTH:   DATE OF ADMISSION:  DATE OF DISCHARGE:                                HISTORY & PHYSICAL   CHIEF COMPLAINT:  This is a 65 year old female with right leg and hip pain.   HISTORY OF PRESENT ILLNESS:  Her history is somewhat confusing but in  summary she began having left-sided leg pain radiating to the left knee  starting in her back approximately a year ago, then in April 2004 it  progressed to her right side.  The left side somewhat resolved.  She was  treated with physical therapy for weakness of her right lower extremity  although they did not work on her back.  She was seen by several physicians  including  Dr. Regino Schultze, Dr. Juanetta Gosling, Dr. Kellie Simmering, Dr. Jena Gauss, and Dr. Karilyn Cota and during  that period she was on Lodine and she still takes that.  She was also tried  on a Duragesic patch 50 mg for pain but became very nauseous and that was  stopped.  She was also using a cane for right leg weakness.   She complains of severe right lower extremity pain radiating from her hip,  down the right thigh and right leg into the dorsum of the foot.  She denies  any numbness or tingling but does note a heavy feeling and a tightness to  the right lower extremity.  The pain is quite severe, not responding to  Lorcet Plus.  It is constant, worsened by sitting, relieved by lying flat,  and there is associated weakness of the right lower extremity.  She denies  any history of serious back problems.   REVIEW OF SYSTEMS:  She reports fatigue, poor circulation, difficulty  swallowing, nausea and vomiting, numbness, weakness, unsteady gait while  walking, joint pain, depression, seasonal allergies, and sinus problems.  Other systems were  negative.  She reports history of high blood pressure,  fibromyalgia, arthritis.   PAST SURGICAL HISTORY:  1. Tonsillectomy in 1967.  2. Laparoscopy in 1974.  3. Cyst removed left hand in 1990.  4. Hysterectomy in 1997.  5. Her gallbladder was removed in 2001.   MEDICATIONS:  1. Lotrel 5/20.  2. Premarin 0.625.  3. Lorcet Plus 7.5.  4. Lodine 400 mg.  5. Elavil 25 mg.  6. Ativan 0.5 mg.  7. Lasix 25 mg.   FAMILY HISTORY:  She has a family history of heart disease and arthritis.   SOCIAL HISTORY:  She is married.  She does not smoke or drink.  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.   PHYSICAL  EXAMINATION:  GENERAL:  She was a well-developed, well-nourished  female.  Grooming and hygiene were excellent.  She was mesomorphic.  VITAL SIGNS:  She was afebrile.  She had normal pulse and normal respiratory  rate.  CARDIOVASCULAR:  No major swelling or varicosities.  Her pulses were  palpable and normal.  Temperature normal, no edema or tenderness.  LYMPHATICS:  No cervical adenopathy.  SKIN:  Without rash, lesion, ulcer, or cafe au lait spots.  This included  examination of the head and neck, upper and lower extremities.  NEUROLOGIC:  Normal coordination.  Deep tendon reflexes were 2+.  Sensation  was intact.  She was alert and oriented x3.  Her mood was flat.  MUSCULOSKELETAL:  She had a very abnormal gait, favored the right leg with  decreased stride length and cadence.  Her greater trochanter was nontender.  She had pain with range of motion of the right hip and internal rotation and  flexion.  Her flexion was only to 90 degrees.  Stability of the hip was  normal.  Gross motor strength in the four extremities was normal.  The other  three extremities had normal range of motion, appearance, a normal  stability, and normal strength.   MEDICAL DECISION MAKING:  Data reviewed.  Several x-ray reports were  sent  over with the patient during the consultation.  She had a CT of the pelvis  with contrast.  The pelvis was normal.  There was a right hip inflammatory  process which was noted by the radiologist.  She also had an MRI of the  lumbar spine.  Those films, as well as the report, was brought over.  She  had degenerative disk disease from T12 to S1, worse at L2 and L3, although  the only significant canal stenosis and foraminal stenosis was at L2-3.  There was an x-ray of her left hip and of pelvis.  There was no fracture or  evidence of any disease at that time.  There was a lumbar spine which showed  loss of disk height noted at L2-3 with end-plate degenerative changes but no  fracture.  I took  x-rays in the office, two views of the right hip, and noted a significantly  degenerative joint with femoral head contour changes suggesting avascular  necrosis.   DIFFERENTIAL DIAGNOSES:  1. Osteoarthritis primary.  2. Osteoarthritis related to avascular necrosis right hip.  3. Degenerative disk disease with foraminal stenosis most notable at L2-3     but significant throughout the entire lumbar spine.   TREATMENT PLAN:  Recommend MRI right hip to evaluate for avascular necrosis  and plan for right total hip arthroplasty.  Start Neurontin 300 mg b.i.d.  for neurogenic pain.  Return in one week with MRI.                                               Vickki Hearing, M.D.    SEH/MEDQ  D:  10/13/2002  T:  10/13/2002  Job:  [id number]

## 2010-08-16 NOTE — Group Therapy Note (Signed)
   NAME:  Rebecca Hartman, Rebecca Hartman                         ACCOUNT NO.:  [id number]   MEDICAL RECORD NO.:  [id number]                   PATIENT TYPE:  INP   LOCATION:  A331                                 FACILITY:  [facility]   PHYSICIAN:  Edward L. Juanetta Gosling, M.D.             DATE OF BIRTH:  May 14, 1945   DATE OF PROCEDURE:  10/28/2002  DATE OF DISCHARGE:                                   PROGRESS NOTE   PROBLEMS:  1. Status post hip replacement.  2. Hypertension.   SUBJECTIVE:  Ms. Pascuzzi says she is doing okay.  She has no complaints.   OBJECTIVE:  Her exam shows that her chest is pretty clear, her heart is  regular, her blood pressure 120/70, the abdomen is soft.   ASSESSMENT:  She is doing better.   PLAN:  She is apparently set for discharge either today or tomorrow.  No  other new treatments at this point.                                               Edward L. Juanetta Gosling, M.D.    Gwenlyn Found  D:  10/28/2002  T:  10/28/2002  Job:  [id number]

## 2010-08-16 NOTE — Consult Note (Signed)
   NAME:  Rebecca Hartman, Rebecca Hartman                         ACCOUNT NO.:  [id number]   MEDICAL RECORD NO.:  [id number]                   PATIENT TYPE:  INP   LOCATION:  A331                                 FACILITY:  [facility]   PHYSICIAN:  Rebecca Hartman, M.D.             DATE OF BIRTH:  Jan 02, 1946   DATE OF CONSULTATION:  DATE OF DISCHARGE:                                   CONSULTATION   REASON FOR CONSULTATION:  Medical management.   SUBJECTIVE:  Rebecca Hartman is a 65 year old who has had a hip replacement  today.   PAST MEDICAL HISTORY:  She has a history in the past of having other medical  problems including hypertension and a fibromyalgia syndrome.   ALLERGIES:  1. She reports allergies to SEAFOOD.  2. ZANAFLEX.   MEDICATIONS:  1. She has been taking Lotrel 5/20 daily.  2. Hydrochlorothiazide 25 mg daily.  3. Prozac 20 mg daily.  4. Ativan 0.5 mg q.4h. p.r.n.  5. Lorcet Plus 7.5 q.4h. p.r.n.  6. Neurontin 300 mg b.i.d.  7. Ambien 10 mg q.h.s. p.r.n. sleep.  8. Claritin on a p.r.n. basis.  9. Colon Cleanse.  10.      She uses Tums.  11.      Eye drops.   PAST SURGICAL HISTORY:  1. She has had a tonsillectomy.  2. Laparoscopy.  3. A cyst removed from her left hand.  4. Hysterectomy in 1997.  5. Cholecystectomy in 2001.   FAMILY HISTORY:  Apparently is positive for heart disease.   SOCIAL HISTORY:  She does not smoke.  She does not drink any alcohol.  She  has been employed at [employer].   PHYSICAL EXAMINATION:  HEENT:  Shows pupils equal, round, reactive to light  and accomodation.  GENERAL:  She is somewhat sleepy from her pain medication.  VITAL SIGNS:  Her blood pressure is 110/70, pulse of 70, respirations 18.  CHEST:  Fairly clear without wheezes.  HEART:  Regular without murmur, gallop or rub.  ABDOMEN:  Soft.  EXTREMITIES:  I did not examine her extremities at this time.   ASSESSMENT:  1. She has had a hip replacement.  2. She does have hypertension.  3.  History of depression.  4. History of fibromyalgia.   PLAN:  Continue treatments as per Rebecca Hartman.  Follow medically.                                               Rebecca Hartman, M.D.    ELH/MEDQ  D:  10/25/2002  T:  10/25/2002  Job:  [id number]

## 2010-08-16 NOTE — Consult Note (Signed)
NAME:  Rebecca Hartman, Trovato NO.:  [id number]   MEDICAL RECORD NO.:  [id number]                    PATIENT TYPE:   LOCATION:                                       FACILITY:   PHYSICIAN:  Aundra Dubin, M.D.            DATE OF BIRTH:   DATE OF CONSULTATION:  08/25/2002  DATE OF DISCHARGE:                                   CONSULTATION   CHIEF COMPLAINT:  Fibromyalgia, right hip and knee pain.   HISTORY:  The patient tells me that since about March she has started to  ache in the right lateral hip and groin area, and her knee feels as if it  will give way.  There was no trauma or fall to precipitate this.  She says  she has stopped working.  She reports that her husband needs to help her  undress at this time.  Her weight is stable and is up 40 pounds.  She feels  swollen in the hands, and they ache considerably.   MEDICATIONS:  1. Elavil 25 mg h.s.  2. Ativan 1 mg h.s.  3. Lorcet p.r.n.  4. Ultram b.i.d.  5. Prozac 20 mg daily.  6. Lodine 400 mg b.i.d.  7. Estrace 0.5 mg daily.  8. HCTZ daily.   PHYSICAL EXAMINATION:  VITAL SIGNS:  Weight 158 pounds.  Blood pressure  120/70, respirations 16.  GENERAL:  She appears tired.  LUNGS: Clear.  HEART:  Regular, no murmur.  MUSCULOSKELETAL:  She has degenerative-type changes with squaring at the  first CMCs primarily.  The MCPs and other small joints of the fingers do not  appear overly swollen. Wrists have a good range of motion.  Elbows and  shoulders good range of motion.  Trigger points around the shoulders, neck,  and upper paraspinous muscles are tender.  Movement of the leg finds the  right hip has a full range of motion, but there is some mild right groin  discomfort.  Palpation directly over the right trochanteric bursa finds it  has minimal tenderness, but she is tender superior and somewhat posterior  to this area.  The right knee is cool, flexes with stiffness but with  minimal discomfort  to 130 degrees.  There is no swelling or joint line  tenderness.  The stability was good.  Her gait seemed to be slightly  antalgic and favoring the left leg, and she pointed to the groin and outer  hip area where the discomfort is.   ASSESSMENT AND PLAN:  1. Right hip area and knee pain.  She tells me that these areas have been     recently x-rayed, and she has had an MRI.  If these are normal, which     they are by her report, then I will have her go to physical therapy to     try and help with this.  She will continue  with the Ultram and Lodine.     Hopefully, this will slowly improve.  This is not really part     of the fibromyalgia type process, but probably a separate problem.  2. Fibromyalgia.  She continues with her sleep medicine and the Ultram for     this.   She will return in four months.                                               Aundra Dubin, M.D.    WWT/MEDQ  D:  08/25/2002  T:  08/25/2002  Job:  [id number]   cc:   Ramon Dredge L. Juanetta Gosling, M.D.  [address]  [place]  [place] [address]  Fax: [phone]

## 2010-08-16 NOTE — Op Note (Signed)
NAME:  Rebecca Hartman, Rebecca Hartman                         ACCOUNT NO.:  [id number]   MEDICAL RECORD NO.:  [id number]                   PATIENT TYPE:  INP   LOCATION:  A331                                 FACILITY:  [facility]   PHYSICIAN:  Vickki Hearing, M.D.           DATE OF BIRTH:  1945-10-31   DATE OF PROCEDURE:  10/25/2002  DATE OF DISCHARGE:                                 OPERATIVE REPORT   PREOPERATIVE DIAGNOSIS:  Avascular necrosis, right hip.   POSTOPERATIVE DIAGNOSIS:  Avascular necrosis, right hip.   PROCEDURE:  Right total hip arthroplasty.   IMPLANTS USED:  [employer] press-fit 812 stem, 52 mm cup, with a 10  degree liner, 32 mm head, and a +5 neck.   SURGEON:  Vickki Hearing, M.D.   ANESTHESIA:  General.   ESTIMATED BLOOD LOSS:  1000 mL.   HISTORY:  Rebecca Hartman is 65 years old.  She began having right hip pain in  April 2004, eventually had x-rays and MRI showing avascular necrosis of the  right hip with no underlying cause.  She was having intense pain and  dysfunction in terms of activities of daily living and presented for right  total hip replacement.   DETAILS OF PROCEDURE:  Rebecca Hartman was identified in the holding area.  The  right hip was marked with the surgeon's initials.  She was given 1 g of  Ancef, taken to the operating room for general anesthesia.  She was placed  in the lateral decubitus position with the right side up, axillary roll  placed in the axilla, and padding in the lower extremities.  She was placed  in a hip positioner and her right lower extremity was prepped and draped in  sterile technique.  At that time a time out was taken to confirm the  surgical site and check the chart.  Once this was done, the procedure  continued in the following manner:  An incision was made over the right  greater trochanter, extended proximally and distally.   The subcutaneous tissue was divided, the fascia was split in line with the  skin incision,  the abductor mechanism was identified, tagged, and peeled  from the greater trochanter, including the gluteus minimus tendon.  The  capsule was identified and at that time was noted to be hypertrophic and  hyperemic with thickening and friability.  A capsulectomy was then  performed, exposing the hip.  The hip was dislocated, the femoral neck cut  was made provisionally, and the acetabulum was prepared for reaming.  The  soft tissue was removed until a good view of the acetabulum was performed.  Serial acetabular reamers starting with 42, progressing to a 52, until a  good fit was noted was performed.  Trial liner was placed.  The trial cup  and liner were kept in place.   The femur was then prepared using a box osteotome.  A tapered  reamer to  enter the canal and serial tapered reamings up to a press-fit 8, followed by  broaching to a press-fit 8 and then distal reaming up to a 12.5.  A large  812 broach was passed and had a good fit.  A trial was performed with a 0  and +5 neck and a 35 mm head.  The +5 fit best.  Full extension was  obtainable, good flexion past 90 was notable.  Internal rotation in flexion  was noted up to 60 degrees and no dislocation.  The 90-90 position was  easily obtainable.  The hyperextended 5 degree position in external rotation  to 45 degrees was easily obtained and no dislocation.   The trial prostheses were removed.  The acetabulum was dried.  A 52 mm metal  shell was placed, followed by the 32 mm head size polyethylene insert.  The  stem was press-fit in place.  The +5 neck length and head were added, and  the hip was reduced.  A second trial range of motion was performed.  This  matched the trials and the hip was then irrigated, injected with 30 mL of  0.5% Marcaine, and the abductors were closed with nonabsorbable suture  through drill holes.  The fascia was closed with #1 interrupted and running  Bralon suture, followed by 2-0 Vicryl subcu stitches.   Skin was closed with  staples.  A sterile dressing was applied.  Abduction pillow was applied.  The patient was taken to the recovery room in stable condition.   PLAN:  PCA, Lovenox prophylaxis.  The patient has opted to not use TED hose,  but we will use Flowtron.  She understands that she has a slightly increased  risk of DVT.   Weightbearing as tolerated for six weeks due to the press-fit stem.  Planned  for rehab at home, but rehab at a rehab facility is an option.                                               Vickki Hearing, M.D.    SEH/MEDQ  D:  10/25/2002  T:  10/26/2002  Job:  [id number]

## 2010-08-16 NOTE — H&P (Signed)
[facility]  Patient:    Rebecca Hartman, Rebecca Hartman Visit Number: [id number] MRN: [id number]          Service Type: RHE Location: [facility] Attending Physician:  Aundra Dubin Dictated by:   Nathaneil Canary, M.D. Admit Date:  06/03/2001   CC:         Karleen Hampshire, M.D.   History and Physical  CHIEF COMPLAINT:  Hands aching all over.  BRIEF HISTORY:  I last saw the patient on July 16, 2000.  At this time she is hurting a great deal in her hands from her known osteoarthritis.  She describes a great deal of aching in the buttocks and upper thigh area.  She also aches around the shoulders.  She is tired, she is not sleeping well.  She occasionally has headaches but no bowel problems. Her weight is stable and is down 2 pounds.  There has been no swollen joints, URIs, fever, cough, shortness of breath, numbness or tingling.  MEDICATIONS: 1. Ambien is 5 mg h.s. 2. Allegra p.r.n. 3. Tylenol arthritis 2 q. day. 4. HydroDIURIL 25 mg q. day. 5. Darvocet rare. 6. Lodine 400 mg p.r.n.. 7. lotrel q. day. 8. Premarin 0.625 mg q. day. 9. Flexeril 1 h.s.  PHYSICAL EXAMINATION:  VITAL SIGNS:  Weight 146 pounds.  Blood pressure 110/70, respirations 20.  GENERAL:  She appears somewhat tired and is slightly teary.  LUNGS:  Clear.  HEART:  Regular, no murmur.  LOWER EXTREMITIES:  No edema.  MUSCULOSKELETAL:  She has decided ______ at the right, greater than left, first CMP.  This area is tender.  There is no other swelling to the small joints of the hands.  Wrists, elbows, shoulders, good range of motion.  Hips, knees, ankles and feet had mild stiffness but a good range of motion and showed no arthritis. Trigger points at the elbow, shoulder, neck, occiput, anterior chest and along the paraspinous muscles were tender.  ASSESSMENT AND PLAN: 1. Fibromyalgia type process.  In addition to the poor sleep I believe she has    some depression.  I have encouraged her to  use Ambien 10 mg h.s. to help    with her sleep. I have placed her on Paxil 10 mg q. day for 1 week and then    20 mg q. day. 2. Osteoarthritis of hands.  She will Tylenol as above.  She feels that the    Lodine is bothering her stomach and asks about other medicines.  She has    been on Vioxx and Celebrex in the past.  I have given her a prescription    for Bextra 20 mg q. day. 3. Heartburn.  She is advised to not start the Bextra until the heartburn is    better.  She uses Maalox some but she can also use over-the-counter Tagamet    or Zantac.  FOLLOWUP:  She will return in 2 months. Dictated by:   Nathaneil Canary, M.D. Attending Physician:  Aundra Dubin DD:  06/03/01 TD:  06/04/01 Job: [id number]/[id number]

## 2010-08-16 NOTE — Consult Note (Signed)
Rebecca Hartman, Rebecca Hartman               ACCOUNT NO.:  [id number]   MEDICAL RECORD NO.:  [id number]          PATIENT TYPE:  AMB   LOCATION:  DAY                           FACILITY:  [facility]   PHYSICIAN:  Kassie Mends, M.D.      DATE OF BIRTH:  02-Jan-1946   DATE OF CONSULTATION:  07/13/2006  DATE OF DISCHARGE:                                 CONSULTATION   REASON FOR CONSULTATION:  Rectal bleeding.   HISTORY OF PRESENT ILLNESS:  Rebecca Hartman is a 65 year old female who was  last seen in clinic in December 2005.  She presents with rectal bleeding  and a question of whether or not she has celiac sprue.  She had celiac  serologies drawn in February 2008.  She had one celiac serology to  suggest that she had celiac sprue.  She had a gliadin antibody, which is  27.  The upper limit of normal is greater than 9.  However, she had a  tissue transglutaminase, which was 1, negative being 0-3.   She reports having varying stool consistencies.  She may have diarrhea.  She may have constipation.  She may have a normal formed stool.  She  complains of pain in her right side since January2008.  She complains of  an achy feeling in her right lower quadrant most of the time.  It does  not keep her awake.  It is always in the same place.  It may last days.  It is separate from the rectal bleeding.  Occasionally she passes blood  after intercourse.  She has seen no change in bowel habits.  She has a  bowel movement at least every day, sometimes every 2 days.  She denies  any fever, nausea, vomiting or weight loss.  She has gained weight since  Christmas.  She has gained approximately 18 pounds.  She had been placed  on steroids for a rash.  She describes it as little blisters.  She  only has rectal pain if she has herd bowel movements.  She denies any  rectal itching.  Within the last year, she has passed out two times  while straining to have a bowel movement.  She denies any problems  swallowing.  She treats  her acid reflux with Equate acid reducer.  It  is a [employer] brand.  She uses arthritis BC once in a while.  When  questioned further, she uses that once a week.  She denies any  ibuprofen, Motrin or Aleve use.   PAST MEDICAL HISTORY:  1. Fibromyalgia.  2. IBS.  3. Osteoarthritis.  4. GERD.  5. Hypertension.  6. Hyperlipidemia.  7. Hypothyroidism.   PAST MEDICAL HISTORY:  1. Tonsillectomy.  2. Hysterectomy.  3. Right hip surgery for avascular necrosis.  4. Cholecystectomy.  5. Left carpal tunnel syndrome.   ALLERGIES:  ZANAFLEX AND SHELLFISH.   MEDICATIONS:  1. Levothyroxine 50 mcg daily.  2. Cardizem.  3. Lisinopril.  4. Lasix.  5. Provigil for fibromyalgia.  6. Neurontin.  7. Lexapro.  8. Estradiol.  9. Crestor.  10.Xyzal.  A new allergy  medicine.  11.Temazepam 15 mg daily.  12.Equate acid reducer.  13.Ativan 1 mg daily.  14.Lortab 10/500 as needed daily.  15.Flexeril.  16.Vitamin E.  17.Calcium.   FAMILY HISTORY:  She denies any family history of colon cancer, colon  polyps.   SOCIAL HISTORY:  She is married.  He has one son.  She is unemployed.  She is disabled from [employer].  She does not smoke or  drink.   REVIEW OF SYSTEMS:  Per the HPI, otherwise all systems are negative.   PHYSICAL EXAMINATION:  VITAL SIGNS:  Weight 179 pounds, height 5 feet 5  inches, BMI 29.8 (overweight), temperature 98.5, blood pressure 112/72,  pulse 80.  GENERAL:  She is in no apparent distress, alert and orient  x4.  HEENT:  Exam is atraumatic, normocephalic.  Pupils equal, react to  light.  Mouth no oral lesions.  Posterior pharynx without erythema or  exudate, moon faces.NECK:  Full range of motion.  No lymphadenopathy,  obese.BACK:  [place] hump.LUNGS:  Clear to auscultation bilaterally.  CARDIOVASCULAR:  Shows regular rhythm, no murmur. Normal S1 and S2.  ABDOMEN:  Bowel sounds present, soft, obese, nontender, nondistended.  No rebound or guarding, unable  to appreciate hepatosplenomegaly,  abdominal bruits or pulsatile masses.  EXTREMITIES:  No cyanosis,  clubbing or edema.  NEUROLOGIC:  She has no focal neurologic deficits.   ASSESSMENT:  Rebecca Hartman is a 65 year old female with intermittent change  in consistency of her stools, which is likely secondary to a functional  gut disorder post cholecystectomy.  Changes in her stool consistency due  to dietary changes.  She has a low likelihood of having celiac sprue  just based on the anti-gliadin antibody.  She has been reading up on  celiac sprue and wants to know definitively whether or not she has  celiac sprue.  She has rectal bleeding, which could be a colorectal  polyp, hemorrhoids, and a low likelihood of colorectal cancer.  Thank  you for allowing me to see Rebecca Hartman in consultation.  My  recommendations follow.   RECOMMENDATIONS:  1. She will be scheduled for a colonoscopy followed by an EGD with      duodenal biopsies on filter paper within the next 2 weeks.  She      will be given a MiraLax bowel prep.  2. She will be scheduled for a followup after the endoscopy is      performed.  She may benefit from a low-fat diet, which will help      with changing her stool consistency if her biopsies from her      duodenum show no evidence of celiac sprue.      Kassie Mends, M.D.  Electronically Signed     SM/MEDQ  D:  07/13/2006  T:  07/14/2006  Job:  [id number]   cc:   Ramon Dredge L. Juanetta Gosling, M.D.  Fax: [phone]

## 2010-08-16 NOTE — Consult Note (Signed)
   NAMESHERLIN, SONIER NO.:  [id number]   MEDICAL RECORD NO.:  [id number]                   PATIENT TYPE:   LOCATION:                                       FACILITY:   PHYSICIAN:  Aundra Dubin, M.D.            DATE OF BIRTH:   DATE OF CONSULTATION:  09/29/2002  DATE OF DISCHARGE:                                   CONSULTATION   CHIEF COMPLAINT:  Right hip.   Ms. Putnam is here today as a work-in and she has had about four to six  weeks of aching in the right lateral hip area.  There is some radiation into  the groin.  She feels that there is a small mass there and she wonders if  she has had a hernia rupture.  Her weight is stable.   MEDICINES:  Unchanged from Aug 25, 2002.   PHYSICAL EXAMINATION:  ABDOMEN:  The lower abdomen seems to have a slight  small knot above the true groin area in the abdomen.  This was not  particularly tender.  RIGHT HIP:  Movement of the hip was mildly tender but to the lateral outer  hip area.  Compression over the trochanteric bursa was tender.   PROCEDURE:  Right trochanteric bursa injection.  The skin was cleaned with  alcohol and cooled with ethyl chloride.  Depo-Medrol 80 mg and 1 mL of 2%  lidocaine was injected.                                               Aundra Dubin, M.D.    WWT/MEDQ  D:  09/29/2002  T:  09/29/2002  Job:  [id number]

## 2010-08-16 NOTE — H&P (Signed)
NAME:  Rebecca Hartman, Rebecca Hartman                         ACCOUNT NO.:  [id number]   MEDICAL RECORD NO.:  [id number]                   PATIENT TYPE:  AMB   LOCATION:  DAY                                  FACILITY:  [facility]   PHYSICIAN:  Vickki Hearing, M.D.           DATE OF BIRTH:  [birth year]   DATE OF ADMISSION:  DATE OF DISCHARGE:                                HISTORY & PHYSICAL   CHIEF COMPLAINT:  This is a 65 year old female with right hip and leg pain  for 4 months.   HISTORY OF PRESENT ILLNESS:  Rebecca Hartman has right hip and leg pain secondary  to avascular necrosis of the right hip.  Her pain began in April 2004, and a  workup revealed she had degenerative arthritis of the right hip secondary to  avascular necrosis.  This was confirmed by MRI, cause at this time unknown.  She was initially sent to me by Drs. Rourk and Rehman after their workup for  her right lower quadrant pain proved to be right hip pain.   She has right lower extremity pain radiating from the hip down the right  thigh into the right leg and dorsum of the foot.  She also has a history of  L2-3 degenerative disk disease which is contributing to her symptoms.  However, she has definitive groin pain which is worsened by walking,  sitting, and relieved by rest.  Some of her radicular symptoms are relieved  by lying flat.  There is associated weakness of the right lower extremity,  believed to be coming from her degenerative disk disease.  Any motion of the  hip does cause pain in the groin.   REVIEW OF SYSTEMS:  She reports fatigue, poor circulation, difficulty  swallowing, nausea and vomiting which has been worked up by Drs. Rehman and  Rourk, numbness, weakness, unsteady gait, joint pain, depression, seasonal  allergies, and sinus problems.  All other systems reviewed were negative.   PAST MEDICAL HISTORY:  1. Hypertension.  2. Fibromyalgia.  3. Arthritis.   PAST SURGICAL HISTORY:  1. Tonsillectomy in 1967.  2. Laparoscopy in 1974.  3. Cyst removal left hand in 1990.  4. Hysterectomy in 1997.  5. Cholecystectomy in 2001.   CURRENT MEDICATIONS:  1. Lotrel 5/20.  2. Premarin 0.625.  3. Lorcet Plus 7.5.  4. Lodine 400 mg.  5. Elavil 25 mg.  6. Ativan 0.5 mg.  7. Lasix 25 mg.  8. Neurontin 100 mg.   FAMILY HISTORY:  Heart disease and arthritis.   SOCIAL HISTORY:  She is married.  She does not smoke or drink.  She  completed her high-school education and retired after working at [employer].   PHYSICAL EXAMINATION:  GENERAL:  Well-developed, well-nourished.  Grooming  and hygiene normal.  She is mesomorphic.  She has no gross deformity.  VITAL SIGNS:  Afebrile.  Her pulses were in normal range of 74,  respiratory  rate of 18.  CARDIOVASCULAR:  No major swelling or varicosities.  Pulses palpable and  normal.  Temperature normal.  No edema.  No tenderness.  LYMPHATICS:  Cervical spine negative.  SKIN:  Without rash, lesion, ulcer, or cafe au lait spots.  NEUROLOGIC:  Normal coordination.  Deep tendon reflexes 2+.  Gross sensation  to soft touch was intact.  She was alert and oriented x3.  Mood and affect  were flat.  Of note, there was no loss of sensation of the dorsum of the  foot to soft touch.  MUSCULOSKELETAL:  Abnormal gait pattern, favoring the right leg, with  decreased stride length and decreased cadence.  Greater trochanter was  nontender.  She had pain with range of motion of the hip referred to the  groin.  This was noted mostly on internal rotation and flexion.  She could  only flex the hip to 90 degrees.  The hip stability was normal.  Gross motor  strength of 4 extremities normal.  The remaining 3 extremities had normal range of motion, appearance,  stability, and strength.  SPINE:  The spine exam showed increased tenderness in the lumbar spine,  mainly in the midline between L2 to L4.  There was no associated muscle  spasm noted.  There was some pain in the right  buttock.   Medical decision-making, data reviewed.   LABORATORY DATA:  X-ray and MRI reveals avascular necrosis of the right hip  with bone-on-bone contact of the femoral head with femoral head changes.  MRI of the lumbar spine shows degenerative disk disease from T12 to S1,  worse at L2-3, with some canal stenosis and foraminal stenosis at L2-3.   DIAGNOSES:  1. Avascular necrosis right hip.  2. Degenerative disk disease with foraminal stenosis at L2-3 and also     throughout the lumbar spine.   PLANNED TREATMENT:  Right total hip arthroplasty.  The patient has consented  with informed consent.  Accepts the risks and benefits associated with this  procedure.                                               Vickki Hearing, M.D.    SEH/MEDQ  D:  10/24/2002  T:  10/24/2002  Job:  [id number]

## 2010-08-16 NOTE — Consult Note (Signed)
[facility]  Patient:    Rebecca Hartman, Rebecca Hartman Visit Number: [id number] MRN: [id number]          Service Type: RHE Location: SPCL Attending Physician:  Aundra Dubin Dictated by:   Nathaneil Canary, M.D. Proc. Date: 10/14/01 Admit Date:  10/14/2001   CC:         Karleen Hampshire, M.D.   Consultation Report  CHIEF COMPLAINT:  Fibromyalgia and right hip pain.  HISTORY OF PRESENT ILLNESS:  Ms. Tallie reports that during the month of May that she was severely aching.  She said that she spent most of the month in bed.  She is aching in the right hip which occasionally hurts with walking. She has generalized aching.  Her hands continue to hurt and this is related to her osteoarthritis.  Her weight is down three pounds.  I noted that her weight in October of 2001 was 156 and she is down 12 pounds.  There has been no URI, fever, cough, or shortness of breath.  She has had some difficulty with swallowing and avoids breads on a regular basis.  MEDICATIONS:  1. Ambien 1/2 h.s.  2. Allegra p.r.n.  3. Tylenol arthritis p.r.n.  4. Lo-Trol 5/20 q.d.  5. Premarin 0.625 mg three times a week.  6. Flexeril h.s.  7. HCTZ q.d.  8. Prozac 20 mg q.d.  9. Relafen 1000 mg q.d. 10. Equate acid reducer four per day.  PHYSICAL EXAMINATION:  VITAL SIGNS:  Weight 144 pounds.  Blood pressure 130/70, respirations 16.  GENERAL:  No distress.  LUNGS:  Clear.  HEART:  Regular.  No murmur.  NECK:  Negative JVD.  MUSCULOSKELETAL:  She has degenerative changes to the DIPs along with significant squaring and swelling at the right greater than left first [facility]. The wrists, elbows, shoulders, and neck have good range of motion with mild neck stiffness.  Trigger points at the elbows, shoulder, neck, occiput, anterior chest, and paraspinous muscles were tender with mild wincing.  The knees were cool.  There was no joint line tenderness to the left knee and no tenderness with  flexion.  Ankle and feet were nontender.  The right trochanteric bursa was mildly tender.  ASSESSMENT/PLAN: 1. Fibromyalgia:  I will try her with Ultram 50 mg b.i.d./t.i.d. p.r.n.    She will also try this with Tylenol 500 mg.  She knows to not exceed    4000 mg of APAP per day.  She will continue on the Prozac.  She has used    Ativan in the past and would like to try this again.  She is having    difficulty with insurance covering a full months supply of Ambien.    She will continue on the Prozac. 2. Osteoarthritis of hands:  She will continue with the Relafen. 3. Swallowing difficulties:  She would like to see an ENT physician which    may be appropriate.  I suggest Dr. Lucky Cowboy. 4. She will return in three months and we will check labs at that time. Dictated by:   Nathaneil Canary, M.D. Attending Physician:  Aundra Dubin DD:  10/14/01 TD:  10/17/01 Job: [id number]

## 2010-08-16 NOTE — Group Therapy Note (Signed)
   NAME:  Rebecca Hartman, Rebecca Hartman                         ACCOUNT NO.:  [id number]   MEDICAL RECORD NO.:  [id number]                   PATIENT TYPE:  INP   LOCATION:  A331                                 FACILITY:  [facility]   PHYSICIAN:  Edward L. Juanetta Gosling, M.D.             DATE OF BIRTH:  [birth year]   DATE OF PROCEDURE:  10/27/2002  DATE OF DISCHARGE:                                   PROGRESS NOTE   PROBLEMS:  1. Status post right hip replacement.  2. Hypertension.  3. Anxiety and depression.   SUBJECTIVE:  Rebecca Hartman says she is feeling great and has no complaints this  morning.  She wants to get up.  She has not been using her PCA pump.  She  would like to get the IV out.  I have told her that I would need to discuss  all this with Dr. Romeo Apple to be sure that he does not need the IV for some  other reason.   OBJECTIVE:  Her exam this morning shows that her temperature is 98.5, pulse  97, respirations are 20, blood pressure 107/62 - the highest temperature is  100.8.  Her chest is quite clear.  She looks very comfortable today.  Her  white count is 9000, hemoglobin 9.1, hematocrit 27, platelets 285.  Her  potassium is 3.5, glucose 119, sodium is a bit low at 130 but it is better  than the 125 that she had earlier.   ASSESSMENT:  She seems to be improving.   PLAN:  Continue with her treatments and medications; no changes today.                                               Edward L. Juanetta Gosling, M.D.    ELH/MEDQ  D:  10/27/2002  T:  10/27/2002  Job:  [id number]

## 2010-08-16 NOTE — Group Therapy Note (Signed)
   NAME:  SHARETTA, RICCHIO                         ACCOUNT NO.:  [id number]   MEDICAL RECORD NO.:  [id number]                   PATIENT TYPE:  INP   LOCATION:  A331                                 FACILITY:  [facility]   PHYSICIAN:  Edward L. Juanetta Gosling, M.D.             DATE OF BIRTH:  11-15-1945   DATE OF PROCEDURE:  10/26/2002  DATE OF DISCHARGE:                                   PROGRESS NOTE   PROBLEM:  1. Status post hip replacement.  2. Hypertension.   SUBJECTIVE:  Ms. Gerstel says she is feeling better.  She has no new  complaints.  She still has some knee discomfort.  Her pain is fairly well  controlled and she is looking forward to getting up.   OBJECTIVE:  Her exam today shows that her chest is pretty clear, her heart  is regular, her abdomen is soft.   ASSESSMENT:  She has previous hip replacement.  Her pressure is well  controlled.   PLAN:  Get her up and moving around some today.                                               Edward L. Juanetta Gosling, M.D.    ELH/MEDQ  D:  10/26/2002  T:  10/26/2002  Job:  [id number]

## 2010-08-16 NOTE — Consult Note (Signed)
[facility]  Patient:    Rebecca Hartman, Rebecca Hartman Visit Number: [id number] MRN: [id number]          Service Type: RHE Location: SPCL Attending Physician:  Rebecca Hartman Dictated by:   Rebecca Hartman, M.D. Proc. Date: 07/22/01 Admit Date:  07/22/2001   CC:         Rebecca Hartman, M.D.   Consultation Report  CHIEF COMPLAINT:  Hands, fibromyalgia.  HISTORY OF PRESENT ILLNESS:  Ms. Rebecca Hartman reports that she has had some improvement over the last six weeks, but has had a bad week.  She is aching in the lateral hips.  Her hands hurt, particularly at the first CMCs.  Because of cost and insurance reasons, she did not take the Paxil and she started on Prozac.  She feels that this has smoothed out some of the anxiety that she has had.  She is sleeping some better with the Ambien.  Another problem is she has found that foods such as meat will catch in her throat.  This causes some brief shortness of breath.  She has lost no weight.  She is not a smoker. There has been no chest pain or extended shortness of breath.  MEDICATIONS:  1. Ambien 10 mg h.s.  2. Allegra 60 mg p.r.n.  3. Tylenol Arthritis p.r.n.  4. Lotrel 5/20 q.d.  5. Premarin 0.625 mg three times a week.  6. Flexeril 10 mg p.r.n.  7. HCTZ q.d.  8. Prozac 20 mg q.d.  9. Relafen 1000 mg q.d. 10. Equate acid reducer four q.d.  PHYSICAL EXAMINATION  VITAL SIGNS:  Weight 147 pounds, blood pressure 122/82, respirations 14.  GENERAL:  She does not appear as tired as she did in early March.  SKIN:  Clear.  LUNGS:  Clear.  HEART:  Regular.  No murmur.  EXTREMITIES:  Lower extremities no edema.  MUSCULOSKELETAL:  She continues with the degenerative changes, particularly at the right first [facility] which is swollen and tender.  The wrists move with stiffness.  Elbows, shoulders, neck:  Good range of motion.  Trigger points around the elbows, shoulders, neck occiput, and upper paraspinous muscles  are quite tender.  Knees, ankles, and feet have a good range of motion and show no synovitis.  The right trochanteric bursa was nontender.  ASSESSMENT AND PLAN: 1. Fibromyalgia type syndrome.  She actually has had some improvement over the    two months.  She has been aching in the last week, but overall has    gradually improved.  We will continue her on the Ambien and Prozac. 2. Osteoarthritis.  She has found that the Bextra helps more than the Relafen    and we will switch her back to 20 mg q.d. 3. Dysphagia.  This is an important issue that I will not greatly involve    myself in working up.  I have suggested that she take smaller bites and    chew her food very well to see if this will compensate for the choking type    sensation.  If this is not adequately improved in a few weeks, she will    consult Dr. Regino Hartman.  She will return in three months. Dictated by:   Rebecca Hartman, M.D. Attending Physician:  Rebecca Hartman DD:  07/22/01 TD:  07/22/01 Job: [id number]

## 2010-08-30 DIAGNOSIS — A4902 Methicillin resistant Staphylococcus aureus infection, unspecified site: Secondary | ICD-10-CM | POA: Insufficient documentation

## 2010-09-02 ENCOUNTER — Encounter: Payer: Self-pay | Admitting: Internal Medicine

## 2010-09-02 DIAGNOSIS — M797 Fibromyalgia: Secondary | ICD-10-CM | POA: Insufficient documentation

## 2010-09-02 DIAGNOSIS — E039 Hypothyroidism, unspecified: Secondary | ICD-10-CM | POA: Insufficient documentation

## 2010-09-03 ENCOUNTER — Ambulatory Visit (INDEPENDENT_AMBULATORY_CARE_PROVIDER_SITE_OTHER): Payer: Medicare Other | Admitting: Internal Medicine

## 2010-09-03 ENCOUNTER — Other Ambulatory Visit: Payer: Self-pay | Admitting: *Deleted

## 2010-09-03 ENCOUNTER — Encounter: Payer: Self-pay | Admitting: Internal Medicine

## 2010-09-03 VITALS — BP 166/81 | HR 96 | Temp 99.0°F | Ht 64.5 in | Wt 176.0 lb

## 2010-09-03 DIAGNOSIS — Z22322 Carrier or suspected carrier of Methicillin resistant Staphylococcus aureus: Secondary | ICD-10-CM

## 2010-09-03 MED ORDER — MUPIROCIN 2 % EX OINT: TOPICAL_OINTMENT | Freq: Two times a day (BID) | CUTANEOUS | Status: DC

## 2010-09-03 NOTE — Progress Notes (Signed)
  Subjective:    Patient ID: Rebecca Hartman, female    DOB: 06/13/1945, 65 y.o.   MRN: [id number]  HPI 65 yo female with history of recurrent skin lesions and a report of MRSA culture positive here for help with the recurrent lesions, which have been ongoing for over 1 years.  The patient has tried decolonization with mupiricin without relief.  No fevers.  The patient reports a possible MRSA infection of left arm but no positive cultures, required hospitalization in Feb 2012.  Patient currently is taking Bactrim with some relief.  She states lesions are almost constantly present in different locations.      Review of Systems  Constitutional: Negative.   HENT: Negative.   Eyes: Negative.   Respiratory: Negative.   Cardiovascular: Negative.   Gastrointestinal: Negative.   Genitourinary: Negative.   Musculoskeletal: Negative.   Skin: Positive for rash and wound.  Neurological: Negative.   Hematological: Negative.   Psychiatric/Behavioral: Negative.        Objective:   Physical Exam  Constitutional: She is oriented to person, place, and time. She appears well-developed and well-nourished.  Neurological: She is alert and oriented to person, place, and time.  Skin: Skin is warm and dry.       + multiple ulcerated lesions of approx 1/2 centimeter, dry with no purulence or drainage at this time.            Assessment & Plan:

## 2010-09-03 NOTE — Assessment & Plan Note (Signed)
Recurrent MRSA infection - Based on the patient report, there has been a culture with MRSA and she does respond to Bactrim.  I discussed with Rebecca Hartman that the next step is to try bleach baths.  I have recommended 1/2 cap of bleach in 1/2 bathtub full of water and to soak for 20 minutes once weekly for 3 weeks.  She should also complete her current course of Bactrim and stop.  I also discussed that is she still is not successful with the bleach, that the typical course is several years and it finally dissipates and that is the hope.     She can follow up if she has further questions but otherwise as needed.

## 2010-10-03 ENCOUNTER — Encounter (HOSPITAL_BASED_OUTPATIENT_CLINIC_OR_DEPARTMENT_OTHER): Payer: Medicare Other | Admitting: Internal Medicine

## 2010-10-03 ENCOUNTER — Ambulatory Visit (HOSPITAL_COMMUNITY)
Admission: RE | Admit: 2010-10-03 | Discharge: 2010-10-03 | Disposition: A | Discharge status: Home / Self Care | Payer: Medicare Other | Source: Ambulatory Visit | Attending: Internal Medicine | Admitting: Internal Medicine

## 2010-10-03 DIAGNOSIS — R110 Nausea: Secondary | ICD-10-CM

## 2010-10-03 DIAGNOSIS — K222 Esophageal obstruction: Secondary | ICD-10-CM | POA: Insufficient documentation

## 2010-10-03 DIAGNOSIS — K449 Diaphragmatic hernia without obstruction or gangrene: Secondary | ICD-10-CM

## 2010-10-03 DIAGNOSIS — I10 Essential (primary) hypertension: Secondary | ICD-10-CM | POA: Insufficient documentation

## 2010-10-03 DIAGNOSIS — E785 Hyperlipidemia, unspecified: Secondary | ICD-10-CM | POA: Insufficient documentation

## 2010-10-03 DIAGNOSIS — R112 Nausea with vomiting, unspecified: Secondary | ICD-10-CM | POA: Insufficient documentation

## 2010-10-03 DIAGNOSIS — K270 Acute peptic ulcer, site unspecified, with hemorrhage: Secondary | ICD-10-CM

## 2010-10-03 DIAGNOSIS — Z79899 Other long term (current) drug therapy: Secondary | ICD-10-CM | POA: Insufficient documentation

## 2010-10-03 HISTORY — PX: UPPER GASTROINTESTINAL ENDOSCOPY: SHX188

## 2010-10-03 LAB — GLUCOSE, CAPILLARY: Glucose-Capillary: 123 mg/dL — ABNORMAL HIGH (ref 70–99)

## 2010-10-21 NOTE — Op Note (Signed)
NAMEDAWSON, HOLLMAN               ACCOUNT NO.:  [id number]  MEDICAL RECORD NO.:  [id number]  LOCATION:  DAYP                          FACILITY:  [facility]  PHYSICIAN:  Lionel December, M.D.    DATE OF BIRTH:  02-27-1946  DATE OF PROCEDURE:  10/03/2010 DATE OF DISCHARGE:                              OPERATIVE REPORT   PROCEDURE:  Esophagogastroduodenoscopy with balloon dilation of pyloric channel and bulbar stricture.  INDICATIONS:  Symphonie is a 65 year old Caucasian female with multiple medical problems including history of peptic ulcer disease secondary to NSAID therapy, whose last exam was about 8 weeks ago and she was found to have pyloric channel ulcer as well as distal bulbar ulcer with a stricture.  Both of these areas with dilated with the scope.  She tells me she is not taking NSAIDs.  She is on lansoprazole 30 mg twice daily. She continues to experience nausea and vomiting postprandially, she has at least 3-4 episodes of emesis.  She has not experienced any hematemesis or melena.  She states her heartburn is well controlled with therapy.  Procedure risks were reviewed with the patient.  Informed consent was obtained.  MEDICATIONS FOR CONSCIOUS SEDATION:  Cetacaine spray for pharyngeal topical anesthesia, Demerol 50 mg IV, and Versed 8 mg IV.  FINDINGS:  Procedure performed in endoscopy suite.  The patient's vital signs and O2 sat were monitored during the procedure and remained stable.  The patient was placed in left lateral recumbent position and Pentax videoscope was passed through oropharynx without any difficulty into esophagus.  Esophagus, mucosa of the esophagus was normal.  Previously identified erosions are healed.  GE junction was at 35 cm and hiatus at 37.  Stomach, there was scant amount of food debris in the stomach.  It distended very well insufflation.  Folds of proximal stomach are normal. Examination of mucosa, body and antrum was normal.  There was  pyloric channel ulcer much smaller than on a previous exam.  There was a stricture at pyloric channel.  I was not able to pass the scope across it.  The stricture was therefore dilated with a balloon up to 15 mm. Following this maneuver, I was able to pass the scope into bulb. Angularis, fundus, and cardia were examined by retroflexing scope were normal.  Duodenum, there was food debris in the bulb.  There was stricture distally previously identified circumferential ulcer heals.  I was able to pass the scope to stricture and this was dilated with the same balloon to 15 mm.  As dilation was completed, the balloon was withdrawn. Both of these areas were reexamined.  Pictures were taken for the record.  Endoscope was withdrawn.  The patient tolerated the procedure well.  FINAL DIAGNOSES:  Erosive esophagitis is healed.  Small sliding hiatal hernia.  Scant amount of food debris in the stomach and bulb indicating poor emptying secondary to ulcer in strictures.  Partially healed pyloric channel ulcer with stricture which was dilated with a balloon to 15 mm.  Distal bulbar ulcer completely healed.  Stricture dilated with a balloon to 15 mm.  RECOMMENDATIONS:  It is crucial that she does not take any NSAIDs.  She will  continue lansoprazole 30 mg twice daily.  The patient also given prescription for dicyclomine 10-20 mg daily 30 minutes before evening meal 10 mg tablets 60 with two refills.  She will return for OV in 8 weeks.          ______________________________ Lionel December, M.D.     NR/MEDQ  D:  10/03/2010  T:  10/03/2010  Job:  [id number]  cc:   Ramon Dredge L. Juanetta Gosling, M.D. Fax: [phone]  Electronically Signed by Lionel December M.D. on 10/21/2010 12:08:11 AM

## 2010-10-23 ENCOUNTER — Encounter (HOSPITAL_COMMUNITY): Payer: Self-pay | Admitting: Emergency Medicine

## 2010-10-23 ENCOUNTER — Inpatient Hospital Stay (HOSPITAL_COMMUNITY)
Admission: EM | Admit: 2010-10-23 | Discharge: 2010-10-24 | DRG: 392 | Disposition: A | Discharge status: Home / Self Care | Payer: Medicare Other | Attending: Pulmonary Disease | Admitting: Pulmonary Disease

## 2010-10-23 DIAGNOSIS — E860 Dehydration: Secondary | ICD-10-CM | POA: Diagnosis present

## 2010-10-23 DIAGNOSIS — E1165 Type 2 diabetes mellitus with hyperglycemia: Secondary | ICD-10-CM | POA: Diagnosis present

## 2010-10-23 DIAGNOSIS — K529 Noninfective gastroenteritis and colitis, unspecified: Secondary | ICD-10-CM | POA: Diagnosis present

## 2010-10-23 DIAGNOSIS — Z8679 Personal history of other diseases of the circulatory system: Secondary | ICD-10-CM | POA: Insufficient documentation

## 2010-10-23 DIAGNOSIS — E119 Type 2 diabetes mellitus without complications: Secondary | ICD-10-CM | POA: Diagnosis present

## 2010-10-23 DIAGNOSIS — E876 Hypokalemia: Secondary | ICD-10-CM | POA: Diagnosis not present

## 2010-10-23 DIAGNOSIS — A09 Infectious gastroenteritis and colitis, unspecified: Principal | ICD-10-CM | POA: Diagnosis present

## 2010-10-23 DIAGNOSIS — I10 Essential (primary) hypertension: Secondary | ICD-10-CM | POA: Diagnosis present

## 2010-10-23 DIAGNOSIS — E871 Hypo-osmolality and hyponatremia: Secondary | ICD-10-CM | POA: Diagnosis present

## 2010-10-23 HISTORY — DX: Dorsalgia, unspecified: M54.9

## 2010-10-23 HISTORY — DX: Other chronic pain: G89.29

## 2010-10-23 LAB — GLUCOSE, CAPILLARY
Glucose-Capillary: 101 mg/dL — ABNORMAL HIGH (ref 70–99)
Glucose-Capillary: 104 mg/dL — ABNORMAL HIGH (ref 70–99)

## 2010-10-23 LAB — BASIC METABOLIC PANEL WITH GFR
BUN: 5 mg/dL — ABNORMAL LOW (ref 6–23)
CO2: 20 meq/L (ref 19–32)
Calcium: 9.4 mg/dL (ref 8.4–10.5)
Chloride: 89 meq/L — ABNORMAL LOW (ref 96–112)
Creatinine, Ser: 0.5 mg/dL (ref 0.50–1.10)
GFR calc Af Amer: >60 mL/min
GFR calc non Af Amer: >60 mL/min
Glucose, Bld: 111 mg/dL — ABNORMAL HIGH (ref 70–99)
Potassium: 3.8 meq/L (ref 3.5–5.1)
Sodium: 127 meq/L — ABNORMAL LOW (ref 135–145)

## 2010-10-23 LAB — CBC
HCT: 34.1 % — ABNORMAL LOW (ref 36.0–46.0)
Hemoglobin: 10.7 g/dL — ABNORMAL LOW (ref 12.0–15.0)
MCH: 21.8 pg — ABNORMAL LOW (ref 26.0–34.0)
MCHC: 31.4 g/dL (ref 30.0–36.0)
MCV: 69.5 fL — ABNORMAL LOW (ref 78.0–100.0)
Platelets: 597 10*3/uL — ABNORMAL HIGH (ref 150–400)
RBC: 4.91 MIL/uL (ref 3.87–5.11)
RDW: 17 % — ABNORMAL HIGH (ref 11.5–15.5)
WBC: 9.9 10*3/uL (ref 4.0–10.5)

## 2010-10-23 LAB — DIFFERENTIAL
Basophils Absolute: 0 10*3/uL (ref 0.0–0.1)
Basophils Relative: 0 % (ref 0–1)
Eosinophils Absolute: 0.1 10*3/uL (ref 0.0–0.7)
Eosinophils Relative: 1 % (ref 0–5)
Lymphocytes Relative: 13 % (ref 12–46)
Lymphs Abs: 1.3 10*3/uL (ref 0.7–4.0)
Monocytes Absolute: 0.8 10*3/uL (ref 0.1–1.0)
Monocytes Relative: 8 % (ref 3–12)
Neutro Abs: 7.8 10*3/uL — ABNORMAL HIGH (ref 1.7–7.7)
Neutrophils Relative %: 78 % — ABNORMAL HIGH (ref 43–77)

## 2010-10-23 MED ORDER — ONDANSETRON HCL 4 MG/2ML IJ SOLN: 4.0000 mg | Freq: Four times a day (QID) | INTRAMUSCULAR | Status: DC | PRN

## 2010-10-23 MED ORDER — SODIUM CHLORIDE 0.9 % IV SOLN
INTRAVENOUS | Status: DC
  Administered 2010-10-23 – 2010-10-24 (×2): via INTRAVENOUS

## 2010-10-23 MED ORDER — INSULIN ASPART 100 UNIT/ML ~~LOC~~ SOLN
0.0000 [IU] | Freq: Three times a day (TID) | SUBCUTANEOUS | Status: DC
  Administered 2010-10-24: 2 [IU] via SUBCUTANEOUS
  Filled 2010-10-23: qty 3

## 2010-10-23 MED ORDER — ENOXAPARIN SODIUM 40 MG/0.4ML ~~LOC~~ SOLN
40.0000 mg | SUBCUTANEOUS | Status: DC
  Administered 2010-10-23: 40 mg via SUBCUTANEOUS
  Filled 2010-10-23: qty 0.4

## 2010-10-23 MED ORDER — ONDANSETRON HCL 4 MG/2ML IJ SOLN
INTRAMUSCULAR | Status: AC
  Filled 2010-10-23: qty 2

## 2010-10-23 MED ORDER — ZOLPIDEM TARTRATE 5 MG PO TABS
10.0000 mg | ORAL_TABLET | Freq: Every evening | ORAL | Status: DC | PRN
  Administered 2010-10-23: 10 mg via ORAL
  Filled 2010-10-23: qty 2

## 2010-10-23 MED ORDER — HYDROCODONE-ACETAMINOPHEN 5-325 MG PO TABS
1.0000 | ORAL_TABLET | ORAL | Status: DC | PRN
  Administered 2010-10-24: 2 via ORAL
  Filled 2010-10-23: qty 2

## 2010-10-23 MED ORDER — ACETAMINOPHEN 650 MG RE SUPP: 650.0000 mg | Freq: Four times a day (QID) | RECTAL | Status: DC | PRN

## 2010-10-23 MED ORDER — ACETAMINOPHEN 325 MG PO TABS: 650.0000 mg | ORAL_TABLET | Freq: Four times a day (QID) | ORAL | Status: DC | PRN

## 2010-10-23 MED ORDER — ONDANSETRON HCL 4 MG PO TABS: 4.0000 mg | ORAL_TABLET | Freq: Four times a day (QID) | ORAL | Status: DC | PRN

## 2010-10-23 MED ORDER — LOPERAMIDE HCL 2 MG PO CAPS: 2.0000 mg | ORAL_CAPSULE | ORAL | Status: DC | PRN

## 2010-10-23 MED ORDER — INSULIN ASPART 100 UNIT/ML ~~LOC~~ SOLN: 0.0000 [IU] | Freq: Every day | SUBCUTANEOUS | Status: DC

## 2010-10-23 NOTE — H&P (Signed)
Rebecca Hartman is a 65 year old Caucasian female who came to the emerrgency room because of nauseavomiting and diarrhea.. She is being admitted with dehydration.she got sick about 24 hours ago has been sick all day. She had called my office for nausea medications which we provided but it did not make a difference. She has vomited 10-12 times and she's had significant diarrhea.  Her past medical history is positive for multiple medical problems including recurrent MRSA infections, diabetes mellitus, peptic ulcer disease anxiety and depression  Her social history shows that she lives at home with her husband she does not use alcohol tobacco or illicit drugs  Her family history is not known to be positive for any sort of GI disease and she has not been exposed to any sick contacts that she is aware of  Review of systems is positive for having had a dental extraction and she did receive antibiotics as part of the preparation for that  Her physical examination shows a well-developpears to be in moderate acute distress because of nuseaa She is pale. Her HEENT examination is remarkable for very dry mucous membranes. Her pupils are reactive to light and accommodation. Her neck is supple without masses. Her chest is clear without wheezes rales or rhonchi. Her heart is regular without murmur gallop or rub her abdomen shows hyperactive bowel sounds she is minimally tender diffusely and no masses are felt.extremities showed no edema. Her skin shows multiple skin infection which are chronic Her central nervous system exam is nonfocal.  Lab work is back at this point includes a BMET shows her BUN and creatinine are mildly elevated and her sodium was quite low.  My assessment is that she has probably gastroenteritis. She is hyponatremic.  She has multiple other medical She will be admitted for IV fluids antiemetics.

## 2010-10-23 NOTE — ED Notes (Addendum)
Pt c/o v/d since last night. States diarrhea has slowed down. Pt c/o  nausea.

## 2010-10-24 DIAGNOSIS — E860 Dehydration: Secondary | ICD-10-CM | POA: Diagnosis present

## 2010-10-24 DIAGNOSIS — I10 Essential (primary) hypertension: Secondary | ICD-10-CM | POA: Diagnosis present

## 2010-10-24 DIAGNOSIS — K529 Noninfective gastroenteritis and colitis, unspecified: Secondary | ICD-10-CM | POA: Diagnosis present

## 2010-10-24 DIAGNOSIS — E1165 Type 2 diabetes mellitus with hyperglycemia: Secondary | ICD-10-CM | POA: Diagnosis present

## 2010-10-24 DIAGNOSIS — E876 Hypokalemia: Secondary | ICD-10-CM | POA: Diagnosis present

## 2010-10-24 LAB — DIFFERENTIAL
Basophils Relative: 0 % (ref 0–1)
Eosinophils Absolute: 0.1 10*3/uL (ref 0.0–0.7)
Neutrophils Relative %: 72 % (ref 43–77)

## 2010-10-24 LAB — COMPREHENSIVE METABOLIC PANEL
ALT: 12 U/L (ref 0–35)
Albumin: 3.2 g/dL — ABNORMAL LOW (ref 3.5–5.2)
Alkaline Phosphatase: 79 U/L (ref 39–117)
Calcium: 8.6 mg/dL (ref 8.4–10.5)
GFR calc Af Amer: >60 mL/min (ref >60)
Potassium: 3.1 mEq/L — ABNORMAL LOW (ref 3.5–5.1)
Sodium: 130 mEq/L — ABNORMAL LOW (ref 135–145)
Total Protein: 6.7 g/dL (ref 6.0–8.3)

## 2010-10-24 LAB — GLUCOSE, CAPILLARY: Glucose-Capillary: 102 mg/dL — ABNORMAL HIGH (ref 70–99)

## 2010-10-24 LAB — CBC
MCH: 22.2 pg — ABNORMAL LOW (ref 26.0–34.0)
MCHC: 31.8 g/dL (ref 30.0–36.0)
RDW: 17 % — ABNORMAL HIGH (ref 11.5–15.5)

## 2010-10-24 MED ORDER — AMLODIPINE BESYLATE 5 MG PO TABS
5.0000 mg | ORAL_TABLET | Freq: Every day | ORAL | Status: DC
  Administered 2010-10-24: 5 mg via ORAL
  Filled 2010-10-24: qty 1

## 2010-10-24 MED ORDER — POTASSIUM CHLORIDE CRYS ER 20 MEQ PO TBCR
20.0000 meq | EXTENDED_RELEASE_TABLET | Freq: Two times a day (BID) | ORAL | Status: DC
  Administered 2010-10-24: 20 meq via ORAL
  Filled 2010-10-24: qty 1

## 2010-10-24 MED ORDER — LOPERAMIDE HCL 2 MG PO CAPS: 2.0000 mg | ORAL_CAPSULE | ORAL | Status: AC | PRN

## 2010-10-24 NOTE — Discharge Summary (Signed)
Physician Discharge Summary  Patient ID: Rebecca Hartman MRN: [id number] DOB/AGE: Jan 06, 1946 65 y.o. Primary Care Physician:Rilyn Upshaw L, MD Admit date: 10/23/2010 Discharge date: 10/24/2010    Discharge Diagnoses:  As listed below Principal Problem:  *Gastroenteritis, infectious, presumed Active Problems:  HIGH BLOOD PRESSURE  Dehydration  Hypokalemia, gastrointestinal losses  Hypertension  Diabetes mellitus type 2 in obese   Current Discharge Medication List    START taking these medications   Details  loperamide (IMODIUM) 2 MG capsule Take 1 capsule (2 mg total) by mouth as needed for diarrhea/loose stools. Qty: 30 capsule, Refills: 5      CONTINUE these medications which have NOT CHANGED   Details  clobetasol (TEMOVATE) 0.05 % cream Apply topically 2 (two) times daily.      cyclobenzaprine (FLEXERIL) 10 MG tablet Take 10 mg by mouth 2 (two) times daily as needed. For muscle spasms    dicyclomine (BENTYL) 10 MG capsule Take 10 mg by mouth 4 (four) times daily -  before meals and at bedtime. 1-2 capsules before each meal     diltiazem (CARDIZEM) 120 MG tablet Take 120 mg by mouth 2 (two) times daily.      estradiol (ESTRACE) 0.5 MG tablet Take 0.5 mg by mouth daily.      furosemide (LASIX) 40 MG tablet Take 40 mg by mouth daily.      gabapentin (NEURONTIN) 600 MG tablet Take 600 mg by mouth 3 (three) times daily.      lansoprazole (PREVACID) 30 MG capsule Take 30 mg by mouth daily.      levothyroxine (SYNTHROID, LEVOTHROID) 50 MCG tablet Take 50 mcg by mouth daily.      LORazepam (ATIVAN) 1 MG tablet Take 1 mg by mouth at bedtime. For sleep    metFORMIN (GLUCOPHAGE) 500 MG tablet Take 500 mg by mouth daily with breakfast.     metoprolol (LOPRESSOR) 50 MG tablet Take 50 mg by mouth 2 (two) times daily.      mupirocin (BACTROBAN) 2 % ointment Apply topically 2 (two) times daily. Qty: 60 g, Refills: 5   Associated Diagnoses: MRSA carrier      HYDROcodone-acetaminophen (NORCO) 5-325 MG per tablet Take 1 tablet by mouth 4 (four) times daily as needed. For pain    hydrOXYzine (ATARAX) 50 MG tablet Take 50 mg by mouth 3 (three) times daily as needed. For itching    lisinopril (PRINIVIL,ZESTRIL) 10 MG tablet Take 10 mg by mouth daily.          Discharged Condition:improved    Consults:none  Significant Diagnostic Studies: No results found.  Lab Results: Results for orders placed during the hospital encounter of 10/23/10 (from the past 24 hour(s))  GLUCOSE, CAPILLARY     Status: Abnormal   Collection Time   10/23/10  6:58 PM      Component Value Range   Glucose-Capillary 101 (*) 70 - 99 (mg/dL)  CBC     Status: Abnormal   Collection Time   10/23/10  7:20 PM      Component Value Range   WBC 9.9  4.0 - 10.5 (K/uL)   RBC 4.91  3.87 - 5.11 (MIL/uL)   Hemoglobin 10.7 (*) 12.0 - 15.0 (g/dL)   HCT 30.8 (*) 65.7 - 46.0 (%)   MCV 69.5 (*) 78.0 - 100.0 (fL)   MCH 21.8 (*) 26.0 - 34.0 (pg)   MCHC 31.4  30.0 - 36.0 (g/dL)   RDW 84.6 (*) 96.2 - 15.5 (%)   Platelets  597 (*) 150 - 400 (K/uL)  DIFFERENTIAL     Status: Abnormal   Collection Time   10/23/10  7:20 PM      Component Value Range   Neutrophils Relative 78 (*) 43 - 77 (%)   Neutro Abs 7.8 (*) 1.7 - 7.7 (K/uL)   Lymphocytes Relative 13  12 - 46 (%)   Lymphs Abs 1.3  0.7 - 4.0 (K/uL)   Monocytes Relative 8  3 - 12 (%)   Monocytes Absolute 0.8  0.1 - 1.0 (K/uL)   Eosinophils Relative 1  0 - 5 (%)   Eosinophils Absolute 0.1  0.0 - 0.7 (K/uL)   Basophils Relative 0  0 - 1 (%)   Basophils Absolute 0.0  0.0 - 0.1 (K/uL)  BASIC METABOLIC PANEL     Status: Abnormal   Collection Time   10/23/10  7:20 PM      Component Value Range   Sodium 127 (*) 135 - 145 (mEq/L)   Potassium 3.8  3.5 - 5.1 (mEq/L)   Chloride 89 (*) 96 - 112 (mEq/L)   CO2 20  19 - 32 (mEq/L)   Glucose, Bld 111 (*) 70 - 99 (mg/dL)   BUN 5 (*) 6 - 23 (mg/dL)   Creatinine, Ser 1.61  0.50 - 1.10 (mg/dL)    Calcium 9.4  8.4 - 10.5 (mg/dL)   GFR calc non Af Amer >60  >60 (mL/min)   GFR calc Af Amer >60  >60 (mL/min)  GLUCOSE, CAPILLARY     Status: Abnormal   Collection Time   10/23/10  9:28 PM      Component Value Range   Glucose-Capillary 104 (*) 70 - 99 (mg/dL)   Comment 1 Notify RN     Comment 2 Documented in Chart    CBC     Status: Abnormal   Collection Time   10/24/10  5:28 AM      Component Value Range   WBC 9.1  4.0 - 10.5 (K/uL)   RBC 4.60  3.87 - 5.11 (MIL/uL)   Hemoglobin 10.2 (*) 12.0 - 15.0 (g/dL)   HCT 09.6 (*) 04.5 - 46.0 (%)   MCV 69.8 (*) 78.0 - 100.0 (fL)   MCH 22.2 (*) 26.0 - 34.0 (pg)   MCHC 31.8  30.0 - 36.0 (g/dL)   RDW 40.9 (*) 81.1 - 15.5 (%)   Platelets 569 (*) 150 - 400 (K/uL)  COMPREHENSIVE METABOLIC PANEL     Status: Abnormal   Collection Time   10/24/10  5:28 AM      Component Value Range   Sodium 130 (*) 135 - 145 (mEq/L)   Potassium 3.1 (*) 3.5 - 5.1 (mEq/L)   Chloride 96  96 - 112 (mEq/L)   CO2 19  19 - 32 (mEq/L)   Glucose, Bld 101 (*) 70 - 99 (mg/dL)   BUN 5 (*) 6 - 23 (mg/dL)   Creatinine, Ser 9.14  0.50 - 1.10 (mg/dL)   Calcium 8.6  8.4 - 78.2 (mg/dL)   Total Protein 6.7  6.0 - 8.3 (g/dL)   Albumin 3.2 (*) 3.5 - 5.2 (g/dL)   AST 17  0 - 37 (U/L)   ALT 12  0 - 35 (U/L)   Alkaline Phosphatase 79  39 - 117 (U/L)   Total Bilirubin 0.2 (*) 0.3 - 1.2 (mg/dL)   GFR calc non Af Amer >60  >60 (mL/min)   GFR calc Af Amer >60  >60 (mL/min)  DIFFERENTIAL  Status: Normal   Collection Time   10/24/10  5:28 AM      Component Value Range   Neutrophils Relative 72  43 - 77 (%)   Neutro Abs 6.6  1.7 - 7.7 (K/uL)   Lymphocytes Relative 16  12 - 46 (%)   Lymphs Abs 1.5  0.7 - 4.0 (K/uL)   Monocytes Relative 11  3 - 12 (%)   Monocytes Absolute 1.0  0.1 - 1.0 (K/uL)   Eosinophils Relative 1  0 - 5 (%)   Eosinophils Absolute 0.1  0.0 - 0.7 (K/uL)   Basophils Relative 0  0 - 1 (%)   Basophils Absolute 0.0  0.0 - 0.1 (K/uL)  GLUCOSE, CAPILLARY      Status: Abnormal   Collection Time   10/24/10  7:37 AM      Component Value Range   Glucose-Capillary 102 (*) 70 - 99 (mg/dL)   Comment 1 Notify RN    GLUCOSE, CAPILLARY     Status: Abnormal   Collection Time   10/24/10 11:40 AM      Component Value Range   Glucose-Capillary 144 (*) 70 - 99 (mg/dL)   Comment 1 Notify RN     No results found for this or any previous visit (from the past 240 hour(s)).   Hospital Course: she came in with severe dehydration from a gastrointestinal illness. She was treated with intravenous fluids. Her potassium was somewhat low and that was replaced. After approximately 12 hours in the hospital she felt much better. Her blood pressure was up some so she was restarted on some of her home medications. She was able to eat solid food had no further nausea vomiting or diarrhea and was allowed to go home  Discharge Exam: Blood pressure 191/99, pulse 104, temperature 98.5 F (36.9 C), temperature source Oral, resp. rate 20, height 5\' 4"  (1.626 m), weight 70.7 kg (155 lb 13.8 oz), SpO2 94.00%. She was much improved looked more comfortable did not appear to be dehydrated and her abdomen was soft  Disposition: home  Discharge Orders    Future Appointments: Provider: Department: Dept Phone: Center:   12/10/2010 11:30 AM Malissa Hippo, MD [facility] Lionel December [phone] None        Signed: Jayvien Rowlette L 10/24/2010, 1:49 PM

## 2010-10-24 NOTE — Progress Notes (Signed)
PIV removed without complaint, patient hemodynamically stable upon discharge. Patient verbalized understanding of discharge instructions and follow up appointments. Patient escorted out by CNA, transported by family.

## 2010-10-24 NOTE — Progress Notes (Signed)
Dr Juanetta Gosling notified of Mrs. Asche in 321 bp 199/91. No orders given at this time, stated he was on his way and would reconcile all her home medds and deal with pt bp issue when he arrived.

## 2010-10-24 NOTE — Progress Notes (Signed)
Subjective: She said she feels much better this morning and wants to go home. She's not having any more vomiting and has had only a little bit of diarrhea.  Objective: Vital signs in last 24 hours: Temp:  [98.2 F (36.8 C)-98.5 F (36.9 C)] 98.5 F (36.9 C) (07/26 0600) Pulse Rate:  [101-120] 104  (07/26 0600) Resp:  [19-20] 20  (07/26 0600) BP: (169-191)/(93-104) 191/99 mmHg (07/26 0600) SpO2:  [94 %-100 %] 94 % (07/26 0600) Weight:  [70.7 kg (155 lb 13.8 oz)-79.379 kg (175 lb)] 155 lb 13.8 oz (70.7 kg) (07/25 2149) Weight change:  Last BM Date: 10/23/10  Intake/Output from previous day: 07/25 0701 - 07/26 0700 In: -  Out: 100 [Urine:100]  PHYSICAL EXAM General appearance: alert, cooperative, no distress and moderately obese Resp: clear to auscultation bilaterally Cardio: regular rate and rhythm, S1, S2 normal, no murmur, click, rub or gallop GI: soft, non-tender; bowel sounds normal; no masses,  no organomegaly Extremities: extremities normal, atraumatic, no cyanosis or edema  Lab Results:  [facility] 10/24/10 0528 10/23/10 1920  WBC 9.1 9.9  HGB 10.2* 10.7*  HCT 32.1* 34.1*  PLT 569* 597*   BMET  Basename 10/24/10 0528 10/23/10 1920  NA 130* 127*  K 3.1* 3.8  CL 96 89*  CO2 19 20  GLUCOSE 101* 111*  BUN 5* 5*  CREATININE 0.52 0.50  CALCIUM 8.6 9.4    Studies/Results: No results found.  Medications:  Prior to Admission:  Prescriptions prior to admission  Medication Sig Dispense Refill  . clobetasol (TEMOVATE) 0.05 % cream Apply topically 2 (two) times daily.        . cyclobenzaprine (FLEXERIL) 10 MG tablet Take 10 mg by mouth 2 (two) times daily as needed. For muscle spasms      . dicyclomine (BENTYL) 10 MG capsule Take 10 mg by mouth 4 (four) times daily -  before meals and at bedtime. 1-2 capsules before each meal       . diltiazem (CARDIZEM) 120 MG tablet Take 120 mg by mouth 2 (two) times daily.        Marland Kitchen estradiol (ESTRACE) 0.5 MG tablet Take 0.5 mg by  mouth daily.        . furosemide (LASIX) 40 MG tablet Take 40 mg by mouth daily.        Marland Kitchen gabapentin (NEURONTIN) 600 MG tablet Take 600 mg by mouth 3 (three) times daily.        . lansoprazole (PREVACID) 30 MG capsule Take 30 mg by mouth daily.        Marland Kitchen levothyroxine (SYNTHROID, LEVOTHROID) 50 MCG tablet Take 50 mcg by mouth daily.        Marland Kitchen LORazepam (ATIVAN) 1 MG tablet Take 1 mg by mouth at bedtime. For sleep      . metFORMIN (GLUCOPHAGE) 500 MG tablet Take 500 mg by mouth daily with breakfast.       . metoprolol (LOPRESSOR) 50 MG tablet Take 50 mg by mouth 2 (two) times daily.        . mupirocin (BACTROBAN) 2 % ointment Apply topically 2 (two) times daily.  60 g  5  . HYDROcodone-acetaminophen (NORCO) 5-325 MG per tablet Take 1 tablet by mouth 4 (four) times daily as needed. For pain      . hydrOXYzine (ATARAX) 50 MG tablet Take 50 mg by mouth 3 (three) times daily as needed. For itching      . lisinopril (PRINIVIL,ZESTRIL) 10 MG tablet Take 10  mg by mouth daily.         Scheduled:   . amLODipine  5 mg Oral Daily  . enoxaparin  40 mg Subcutaneous Q24H  . insulin aspart  0-15 Units Subcutaneous TID WC  . insulin aspart  0-5 Units Subcutaneous QHS  . ondansetron      . potassium chloride  20 mEq Oral BID   Continuous:   . DISCONTD: sodium chloride 125 mL/hr at 10/24/10 0732   ZOX:[id number], acetaminophen, HYDROcodone-acetaminophen, loperamide, ondansetron (ZOFRAN) IV, ondansetron, zolpidem  Assesment:she was admitted with gastroenteritis and this morning her blood pressure is up some I do not have her on any of her blood pressure medications because she was dehydrated she was admitted. I do think she's much better. Her potassium was a little bit low and I will have that replaced. If she does well with lunch I will allow her to be discharged later today. Principal Problem:  *Gastroenteritis, infectious, presumed Active Problems:  HIGH BLOOD PRESSURE  Dehydration  Hypokalemia,  gastrointestinal losses  Hypertension  Diabetes mellitus type 2 in obese    Plan:she will have potassium replacement she will be restarted on some of her medications and depending on how she does all that and how she does with lunch and maybe will be discharged    LOS: 1 day   Lamoyne Palencia L 10/24/2010, 1:46 PM

## 2010-10-25 ENCOUNTER — Observation Stay (HOSPITAL_COMMUNITY)
Admission: EM | Admit: 2010-10-25 | Discharge: 2010-10-28 | Disposition: A | Discharge status: Home / Self Care | Payer: Medicare Other | Attending: Pulmonary Disease | Admitting: Pulmonary Disease

## 2010-10-25 ENCOUNTER — Encounter (HOSPITAL_COMMUNITY): Payer: Self-pay | Admitting: *Deleted

## 2010-10-25 DIAGNOSIS — Z79899 Other long term (current) drug therapy: Secondary | ICD-10-CM | POA: Insufficient documentation

## 2010-10-25 DIAGNOSIS — A4902 Methicillin resistant Staphylococcus aureus infection, unspecified site: Secondary | ICD-10-CM | POA: Diagnosis not present

## 2010-10-25 DIAGNOSIS — E876 Hypokalemia: Secondary | ICD-10-CM | POA: Diagnosis present

## 2010-10-25 DIAGNOSIS — M797 Fibromyalgia: Secondary | ICD-10-CM | POA: Diagnosis present

## 2010-10-25 DIAGNOSIS — Z22322 Carrier or suspected carrier of Methicillin resistant Staphylococcus aureus: Secondary | ICD-10-CM

## 2010-10-25 DIAGNOSIS — Z8679 Personal history of other diseases of the circulatory system: Secondary | ICD-10-CM | POA: Diagnosis present

## 2010-10-25 DIAGNOSIS — E119 Type 2 diabetes mellitus without complications: Secondary | ICD-10-CM | POA: Insufficient documentation

## 2010-10-25 DIAGNOSIS — IMO0001 Reserved for inherently not codable concepts without codable children: Secondary | ICD-10-CM | POA: Insufficient documentation

## 2010-10-25 DIAGNOSIS — R197 Diarrhea, unspecified: Secondary | ICD-10-CM | POA: Insufficient documentation

## 2010-10-25 DIAGNOSIS — I10 Essential (primary) hypertension: Secondary | ICD-10-CM | POA: Insufficient documentation

## 2010-10-25 DIAGNOSIS — K529 Noninfective gastroenteritis and colitis, unspecified: Secondary | ICD-10-CM | POA: Diagnosis present

## 2010-10-25 DIAGNOSIS — E860 Dehydration: Secondary | ICD-10-CM | POA: Diagnosis present

## 2010-10-25 DIAGNOSIS — E039 Hypothyroidism, unspecified: Secondary | ICD-10-CM | POA: Diagnosis present

## 2010-10-25 DIAGNOSIS — K279 Peptic ulcer, site unspecified, unspecified as acute or chronic, without hemorrhage or perforation: Secondary | ICD-10-CM

## 2010-10-25 DIAGNOSIS — D509 Iron deficiency anemia, unspecified: Secondary | ICD-10-CM | POA: Diagnosis present

## 2010-10-25 DIAGNOSIS — R112 Nausea with vomiting, unspecified: Secondary | ICD-10-CM | POA: Insufficient documentation

## 2010-10-25 DIAGNOSIS — E871 Hypo-osmolality and hyponatremia: Principal | ICD-10-CM | POA: Insufficient documentation

## 2010-10-25 LAB — COMPREHENSIVE METABOLIC PANEL
ALT: 15 U/L (ref 0–35)
CO2: 19 mEq/L (ref 19–32)
Calcium: 9.1 mg/dL (ref 8.4–10.5)
Chloride: 89 mEq/L — ABNORMAL LOW (ref 96–112)
Creatinine, Ser: 0.52 mg/dL (ref 0.50–1.10)
GFR calc Af Amer: >60 mL/min (ref >60)
GFR calc non Af Amer: >60 mL/min (ref >60)
Glucose, Bld: 114 mg/dL — ABNORMAL HIGH (ref 70–99)
Sodium: 125 mEq/L — ABNORMAL LOW (ref 135–145)
Total Bilirubin: 0.3 mg/dL (ref 0.3–1.2)

## 2010-10-25 LAB — CBC
MCH: 22.2 pg — ABNORMAL LOW (ref 26.0–34.0)
Platelets: 524 10*3/uL — ABNORMAL HIGH (ref 150–400)
RBC: 4.91 MIL/uL (ref 3.87–5.11)
RDW: 16.8 % — ABNORMAL HIGH (ref 11.5–15.5)

## 2010-10-25 LAB — DIFFERENTIAL
Lymphs Abs: 1.5 10*3/uL (ref 0.7–4.0)
Monocytes Absolute: 1.4 10*3/uL — ABNORMAL HIGH (ref 0.1–1.0)
Monocytes Relative: 10 % (ref 3–12)
Neutrophils Relative %: 78 % — ABNORMAL HIGH (ref 43–77)

## 2010-10-25 LAB — GLUCOSE, CAPILLARY: Glucose-Capillary: 130 mg/dL — ABNORMAL HIGH (ref 70–99)

## 2010-10-25 MED ORDER — FAMOTIDINE 20 MG PO TABS
20.0000 mg | ORAL_TABLET | Freq: Two times a day (BID) | ORAL | Status: DC
  Administered 2010-10-25 – 2010-10-27 (×5): 20 mg via ORAL
  Filled 2010-10-25 (×5): qty 1

## 2010-10-25 MED ORDER — ACETAMINOPHEN 325 MG PO TABS
650.0000 mg | ORAL_TABLET | Freq: Four times a day (QID) | ORAL | Status: DC | PRN
  Administered 2010-10-26 – 2010-10-27 (×3): 650 mg via ORAL
  Filled 2010-10-25 (×3): qty 2

## 2010-10-25 MED ORDER — HYDROXYZINE HCL 25 MG PO TABS: 50.0000 mg | ORAL_TABLET | Freq: Three times a day (TID) | ORAL | Status: DC | PRN

## 2010-10-25 MED ORDER — LISINOPRIL 10 MG PO TABS
10.0000 mg | ORAL_TABLET | Freq: Every day | ORAL | Status: DC
  Administered 2010-10-25 – 2010-10-27 (×3): 10 mg via ORAL
  Filled 2010-10-25 (×3): qty 1

## 2010-10-25 MED ORDER — DILTIAZEM HCL 30 MG PO TABS
120.0000 mg | ORAL_TABLET | Freq: Two times a day (BID) | ORAL | Status: DC
  Administered 2010-10-25 – 2010-10-27 (×6): 120 mg via ORAL
  Filled 2010-10-25 (×6): qty 4

## 2010-10-25 MED ORDER — HYDROCODONE-ACETAMINOPHEN 5-325 MG PO TABS
1.0000 | ORAL_TABLET | Freq: Four times a day (QID) | ORAL | Status: DC | PRN
  Administered 2010-10-25: 1 via ORAL
  Filled 2010-10-25: qty 1

## 2010-10-25 MED ORDER — ACETAMINOPHEN 650 MG RE SUPP: 650.0000 mg | Freq: Four times a day (QID) | RECTAL | Status: DC | PRN

## 2010-10-25 MED ORDER — ZOLPIDEM TARTRATE 5 MG PO TABS
5.0000 mg | ORAL_TABLET | Freq: Every evening | ORAL | Status: DC | PRN
  Administered 2010-10-25 – 2010-10-27 (×3): 5 mg via ORAL
  Filled 2010-10-25 (×3): qty 1

## 2010-10-25 MED ORDER — CLOBETASOL PROPIONATE 0.05 % EX OINT
TOPICAL_OINTMENT | Freq: Two times a day (BID) | CUTANEOUS | Status: DC
  Administered 2010-10-25 – 2010-10-27 (×6): via TOPICAL
  Filled 2010-10-25: qty 15

## 2010-10-25 MED ORDER — GABAPENTIN 300 MG PO CAPS
600.0000 mg | ORAL_CAPSULE | Freq: Three times a day (TID) | ORAL | Status: DC
  Administered 2010-10-25 – 2010-10-27 (×7): 600 mg via ORAL
  Filled 2010-10-25 (×8): qty 2

## 2010-10-25 MED ORDER — DEXTROSE-NACL 5-0.9 % IV SOLN
INTRAVENOUS | Status: DC
  Administered 2010-10-25 – 2010-10-27 (×4): via INTRAVENOUS

## 2010-10-25 MED ORDER — FUROSEMIDE 40 MG PO TABS
40.0000 mg | ORAL_TABLET | Freq: Every day | ORAL | Status: DC
  Administered 2010-10-26 – 2010-10-27 (×2): 40 mg via ORAL
  Filled 2010-10-25 (×3): qty 1

## 2010-10-25 MED ORDER — DICYCLOMINE HCL 10 MG PO CAPS
10.0000 mg | ORAL_CAPSULE | Freq: Three times a day (TID) | ORAL | Status: DC
  Administered 2010-10-26 – 2010-10-27 (×7): 10 mg via ORAL
  Filled 2010-10-25 (×9): qty 1

## 2010-10-25 MED ORDER — ENOXAPARIN SODIUM 80 MG/0.8ML ~~LOC~~ SOLN
40.0000 mg | Freq: Two times a day (BID) | SUBCUTANEOUS | Status: DC
  Administered 2010-10-25: 40 mg via SUBCUTANEOUS
  Filled 2010-10-25: qty 0.8

## 2010-10-25 MED ORDER — ENOXAPARIN SODIUM 40 MG/0.4ML ~~LOC~~ SOLN
40.0000 mg | SUBCUTANEOUS | Status: DC
  Administered 2010-10-26 – 2010-10-27 (×2): 40 mg via SUBCUTANEOUS
  Filled 2010-10-25 (×2): qty 0.4

## 2010-10-25 MED ORDER — POTASSIUM CHLORIDE CRYS ER 20 MEQ PO TBCR
20.0000 meq | EXTENDED_RELEASE_TABLET | Freq: Two times a day (BID) | ORAL | Status: DC
  Administered 2010-10-26 (×2): 20 meq via ORAL
  Filled 2010-10-25 (×2): qty 1

## 2010-10-25 MED ORDER — SODIUM CHLORIDE 0.9 % IV SOLN
INTRAVENOUS | Status: DC
  Administered 2010-10-25: 11:00:00 via INTRAVENOUS

## 2010-10-25 MED ORDER — SODIUM CHLORIDE 0.9 % IJ SOLN
INTRAMUSCULAR | Status: AC
  Administered 2010-10-25: 10 mL
  Filled 2010-10-25: qty 10

## 2010-10-25 MED ORDER — ONDANSETRON 4 MG PO TBDP: 4.0000 mg | ORAL_TABLET | Freq: Three times a day (TID) | ORAL | Status: DC | PRN

## 2010-10-25 MED ORDER — PANTOPRAZOLE SODIUM 40 MG PO TBEC
40.0000 mg | DELAYED_RELEASE_TABLET | Freq: Every day | ORAL | Status: DC
  Administered 2010-10-26 – 2010-10-27 (×2): 40 mg via ORAL
  Filled 2010-10-25 (×3): qty 1

## 2010-10-25 MED ORDER — METOPROLOL TARTRATE 50 MG PO TABS
50.0000 mg | ORAL_TABLET | Freq: Two times a day (BID) | ORAL | Status: DC
  Administered 2010-10-25 – 2010-10-27 (×6): 50 mg via ORAL
  Filled 2010-10-25 (×6): qty 1

## 2010-10-25 MED ORDER — SODIUM CHLORIDE 0.9 % IV BOLUS (SEPSIS)
1000.0000 mL | Freq: Once | INTRAVENOUS | Status: AC
  Administered 2010-10-25: 1000 mL via INTRAVENOUS

## 2010-10-25 MED ORDER — METFORMIN HCL 500 MG PO TABS
500.0000 mg | ORAL_TABLET | Freq: Two times a day (BID) | ORAL | Status: DC
  Administered 2010-10-26 – 2010-10-28 (×4): 500 mg via ORAL
  Filled 2010-10-25 (×5): qty 1

## 2010-10-25 MED ORDER — ONDANSETRON HCL 4 MG/2ML IJ SOLN
4.0000 mg | Freq: Four times a day (QID) | INTRAMUSCULAR | Status: DC | PRN
  Administered 2010-10-25: 4 mg via INTRAVENOUS
  Filled 2010-10-25: qty 2

## 2010-10-25 MED ORDER — HYDROMORPHONE HCL 1 MG/ML IJ SOLN
1.0000 mg | Freq: Three times a day (TID) | INTRAMUSCULAR | Status: DC | PRN
  Administered 2010-10-25 – 2010-10-26 (×2): 1 mg via INTRAVENOUS
  Filled 2010-10-25 (×2): qty 1

## 2010-10-25 MED ORDER — ONDANSETRON HCL 4 MG PO TABS: 4.0000 mg | ORAL_TABLET | Freq: Four times a day (QID) | ORAL | Status: DC | PRN

## 2010-10-25 MED ORDER — LOPERAMIDE HCL 2 MG PO CAPS: 2.0000 mg | ORAL_CAPSULE | ORAL | Status: DC | PRN

## 2010-10-25 MED ORDER — ONDANSETRON HCL 4 MG/2ML IJ SOLN
4.0000 mg | Freq: Once | INTRAMUSCULAR | Status: AC
  Administered 2010-10-25: 4 mg via INTRAVENOUS
  Filled 2010-10-25: qty 2

## 2010-10-25 MED ORDER — LEVOTHYROXINE SODIUM 50 MCG PO TABS
50.0000 ug | ORAL_TABLET | Freq: Every day | ORAL | Status: DC
  Administered 2010-10-26 – 2010-10-27 (×2): 50 ug via ORAL
  Filled 2010-10-25 (×3): qty 1

## 2010-10-25 MED ORDER — ONDANSETRON HCL 4 MG/2ML IJ SOLN
4.0000 mg | INTRAMUSCULAR | Status: DC | PRN
  Administered 2010-10-25 – 2010-10-27 (×7): 4 mg via INTRAVENOUS
  Filled 2010-10-25 (×7): qty 2

## 2010-10-25 MED ORDER — CYCLOBENZAPRINE HCL 10 MG PO TABS: 10.0000 mg | ORAL_TABLET | Freq: Two times a day (BID) | ORAL | Status: DC | PRN

## 2010-10-25 MED ORDER — CALCIUM CARBONATE ANTACID 500 MG PO CHEW
1.0000 | CHEWABLE_TABLET | ORAL | Status: DC | PRN
  Administered 2010-10-25 – 2010-10-27 (×4): 200 mg via ORAL
  Filled 2010-10-25 (×4): qty 1

## 2010-10-25 MED ORDER — MUPIROCIN 2 % EX OINT
TOPICAL_OINTMENT | Freq: Two times a day (BID) | CUTANEOUS | Status: DC
  Administered 2010-10-25 – 2010-10-27 (×6): via TOPICAL
  Filled 2010-10-25: qty 22

## 2010-10-25 MED ORDER — ESTRADIOL 1 MG PO TABS
0.5000 mg | ORAL_TABLET | Freq: Every day | ORAL | Status: DC
Start: 2010-10-25 — End: 2010-10-28
  Administered 2010-10-26: 0.5 mg via ORAL
  Filled 2010-10-25: qty 1

## 2010-10-25 MED ORDER — LORAZEPAM 1 MG PO TABS
1.0000 mg | ORAL_TABLET | Freq: Every day | ORAL | Status: DC
  Administered 2010-10-25 – 2010-10-27 (×3): 1 mg via ORAL
  Filled 2010-10-25 (×3): qty 1

## 2010-10-25 NOTE — ED Notes (Signed)
edp in with pt at this time  

## 2010-10-25 NOTE — ED Notes (Signed)
Floor Unable to take report at this time.

## 2010-10-25 NOTE — Progress Notes (Signed)
Pt has been taking her medications off and on today d/t nausea and now vomiting has started. Pt has been encouraged to take her medications if she is able to and has been being given nausea medications to help settle her stomach.

## 2010-10-25 NOTE — ED Notes (Signed)
Pt was released from [facility] yesterday with same c/o n/v/d, pt reports she began vomiting again last night and has continued for the past 12 hs

## 2010-10-25 NOTE — ED Notes (Signed)
Dr Juanetta Gosling paged x2 now

## 2010-10-25 NOTE — ED Provider Notes (Signed)
History     Chief Complaint  Patient presents with  . Nausea   Patient is a 65 y.o. female presenting with vomiting. The history is provided by the patient. No language interpreter was used.  Emesis  This is a recurrent problem. Episode onset: last night. The problem has not changed since onset.The emesis has an appearance of stomach contents. There has been no fever. Associated symptoms include abdominal pain. Pertinent negatives include no chills, no cough, no diarrhea and no fever.  Patient c/o persistent moderate n/v with associated abdominal pain onset last night after returning from hospital after d/c and eating a meal consisting of a sandwich and peaches. Denies chest pain, fever, cough, hematemesis, blood in stool, melena. Patient reports she was evaluated in ED 2 nights ago for n/v/d and was admitted to hospital by Dr. Juanetta Gosling and d/c home yesterday.  Reports h/o diabetes, fibromyalgia, hypertension. Denies h/o CHF.   Patient seen at 7:27 AM   Past Medical History  Diagnosis Date  . Hypertension   . Diabetes mellitus   . Hypothyroidism   . Fibromyalgia   . Chronic back pain     Past Surgical History  Procedure Date  . Tonsillectomy   . Cholecystectomy   . Carpal tunnel release   . Back surgery   . Total hip revision   . Abdominal hysterectomy     History reviewed. No pertinent family history.  History  Substance Use Topics  . Smoking status: Never Smoker   . Smokeless tobacco: Never Used  . Alcohol Use: No    OB History    Grav Para Term Preterm Abortions TAB SAB Ect Mult Living                  Review of Systems  Constitutional: Negative for fever and chills.  Respiratory: Negative for cough and shortness of breath.   Cardiovascular: Negative for chest pain and leg swelling.  Gastrointestinal: Positive for nausea, vomiting and abdominal pain. Negative for diarrhea and blood in stool.  All other systems reviewed and are negative.  All other systems  negative except as noted in HPI.   Physical Exam  BP 173/89  Pulse 103  Temp(Src) 98.1 F (36.7 C) (Oral)  Resp 17  Ht 5\' 4"  (1.626 m)  SpO2 100%  Physical Exam CONSTITUTIONAL: Well developed/well nourished HEAD AND FACE: Normocephalic/atraumatic EYES: EOMI/PERRL, no scleral icterus ENMT: Mucous membranes dry NECK: supple no meningeal signs CV: S1/S2 noted, no murmurs/rubs/gallops noted LUNGS: Lungs are clear to auscultation bilaterally, no apparent distress ABDOMEN: soft, nontender, no rebound or guarding NEURO: Pt is awake/alert, moves all extremitiesx4 EXTREMITIES: pulses normal, full ROM, no edema noted SKIN: warm, color normal, chronic bruising to left hand and wrist  PSYCH: no abnormalities of mood noted  ED Course  Procedures 8:47 AM-Patient informed of lab and imaging results. Explained decision to have patient admitted for worsening dehydration and current symptoms. Will consult with Dr. Juanetta Gosling regarding admission. Patient agrees with plan set forth at this time.   MDM Nursing notes reviewed and considered in documentation Previous records reviewed and considered All labs/vitals reviewed and considered  Pt with hyponatremia/hypokalemia with ekg changes and also dehydration Will admit D/w dr Juanetta Gosling, will admit patient   Date: 10/25/2010  Rate: 99  Rhythm: normal sinus rhythm  QRS Axis: normal  Intervals: QT prolonged  ST/T Wave abnormalities: nonspecific ST changes  Conduction Disutrbances:none  Narrative Interpretation:   Old EKG Reviewed: none available  Results for orders placed  during the hospital encounter of 10/25/10  COMPREHENSIVE METABOLIC PANEL      Component Value Range   Sodium 125 (*) 135 - 145 (mEq/L)   Potassium 3.1 (*) 3.5 - 5.1 (mEq/L)   Chloride 89 (*) 96 - 112 (mEq/L)   CO2 19  19 - 32 (mEq/L)   Glucose, Bld 114 (*) 70 - 99 (mg/dL)   BUN 6  6 - 23 (mg/dL)   Creatinine, Ser 1.61  0.50 - 1.10 (mg/dL)   Calcium 9.1  8.4 - 09.6 (mg/dL)    Total Protein 7.1  6.0 - 8.3 (g/dL)   Albumin 3.6  3.5 - 5.2 (g/dL)   AST 25  0 - 37 (U/L)   ALT 15  0 - 35 (U/L)   Alkaline Phosphatase 80  39 - 117 (U/L)   Total Bilirubin 0.3  0.3 - 1.2 (mg/dL)   GFR calc non Af Amer >60  >60 (mL/min)   GFR calc Af Amer >60  >60 (mL/min)  LIPASE, BLOOD      Component Value Range   Lipase 50  11 - 59 (U/L)  CBC      Component Value Range   WBC 13.1 (*) 4.0 - 10.5 (K/uL)   RBC 4.91  3.87 - 5.11 (MIL/uL)   Hemoglobin 10.9 (*) 12.0 - 15.0 (g/dL)   HCT 04.5 (*) 40.9 - 46.0 (%)   MCV 68.8 (*) 78.0 - 100.0 (fL)   MCH 22.2 (*) 26.0 - 34.0 (pg)   MCHC 32.2  30.0 - 36.0 (g/dL)   RDW 81.1 (*) 91.4 - 15.5 (%)   Platelets 524 (*) 150 - 400 (K/uL)  DIFFERENTIAL      Component Value Range   Neutrophils Relative 78 (*) 43 - 77 (%)   Neutro Abs 10.2 (*) 1.7 - 7.7 (K/uL)   Lymphocytes Relative 11 (*) 12 - 46 (%)   Lymphs Abs 1.5  0.7 - 4.0 (K/uL)   Monocytes Relative 10  3 - 12 (%)   Monocytes Absolute 1.4 (*) 0.1 - 1.0 (K/uL)   Eosinophils Relative 1  0 - 5 (%)   Eosinophils Absolute 0.1  0.0 - 0.7 (K/uL)   Basophils Relative 0  0 - 1 (%)   Basophils Absolute 0.0  0.0 - 0.1 (K/uL)     Chart written by Clarita Crane acting as scribe for Joya Gaskins, MD  I personally performed the services described in this documentation, which was scribed in my presence. The recorded information has been reviewed and considered. Joya Gaskins, MD      Joya Gaskins, MD 10/25/10 [phone]

## 2010-10-25 NOTE — ED Notes (Signed)
Pt resting. States she feels better. Urinated x 2 . Has approx of ns bolus left. NAD.advised  Awaiting all results then edp will be in to update.

## 2010-10-26 DIAGNOSIS — R1013 Epigastric pain: Secondary | ICD-10-CM

## 2010-10-26 DIAGNOSIS — R112 Nausea with vomiting, unspecified: Secondary | ICD-10-CM

## 2010-10-26 LAB — BASIC METABOLIC PANEL
BUN: 4 mg/dL — ABNORMAL LOW (ref 6–23)
CO2: 17 mEq/L — ABNORMAL LOW (ref 19–32)
Chloride: 100 mEq/L (ref 96–112)
GFR calc Af Amer: >60 mL/min (ref >60)
Potassium: 2.7 mEq/L — CL (ref 3.5–5.1)

## 2010-10-26 LAB — CBC
HCT: 31.1 % — ABNORMAL LOW (ref 36.0–46.0)
Hemoglobin: 9.9 g/dL — ABNORMAL LOW (ref 12.0–15.0)
MCHC: 31.8 g/dL (ref 30.0–36.0)
MCV: 69.6 fL — ABNORMAL LOW (ref 78.0–100.0)

## 2010-10-26 LAB — GLUCOSE, CAPILLARY
Glucose-Capillary: 124 mg/dL — ABNORMAL HIGH (ref 70–99)
Glucose-Capillary: 129 mg/dL — ABNORMAL HIGH (ref 70–99)

## 2010-10-26 LAB — DIFFERENTIAL
Basophils Relative: 0 % (ref 0–1)
Eosinophils Absolute: 0.1 10*3/uL (ref 0.0–0.7)
Lymphocytes Relative: 16 % (ref 12–46)
Monocytes Relative: 13 % — ABNORMAL HIGH (ref 3–12)
Neutro Abs: 6.7 10*3/uL (ref 1.7–7.7)

## 2010-10-26 LAB — POTASSIUM: Potassium: 3 mEq/L — ABNORMAL LOW (ref 3.5–5.1)

## 2010-10-26 MED ORDER — POTASSIUM CHLORIDE 10 MEQ/100ML IV SOLN: 10.0000 meq | Freq: Once | INTRAVENOUS | Status: DC

## 2010-10-26 MED ORDER — SODIUM CHLORIDE 0.9 % IJ SOLN
INTRAMUSCULAR | Status: AC
  Filled 2010-10-26: qty 10

## 2010-10-26 MED ORDER — SODIUM CHLORIDE 0.9 % IJ SOLN
INTRAMUSCULAR | Status: AC
  Administered 2010-10-26 (×2)
  Filled 2010-10-26: qty 10

## 2010-10-26 MED ORDER — POTASSIUM CHLORIDE 10 MEQ/100ML IV SOLN
10.0000 meq | INTRAVENOUS | Status: AC
  Administered 2010-10-26 – 2010-10-27 (×3): 10 meq via INTRAVENOUS
  Filled 2010-10-26 (×2): qty 100

## 2010-10-26 NOTE — Progress Notes (Signed)
Rebecca Hartman, Rebecca Hartman               ACCOUNT NO.:  [id number]  MEDICAL RECORD NO.:  [id number]  LOCATION:  A309                          FACILITY:  [facility]  PHYSICIAN:  Iliya Spivack D. Felecia Shelling, MD   DATE OF BIRTH:  [birth year]  DATE OF PROCEDURE:  10/26/2010 DATE OF DISCHARGE:                                PROGRESS NOTE   SUBJECTIVE:  The patient feels much better.  Her breathing is improving. Her cough and wheezing is less.  The patient is alert, awake.  Her breathing is stable.  VITAL SIGNS:  Blood pressure 159/97 pulse 98, respiratory rate 18, temperature 98 degrees Fahrenheit. CHEST:  Decreased air entry.  Few rhonchi. CARDIOVASCULAR SYSTEM:  First and second heart sound heard.  No murmur. No gallop. ABDOMEN:  Soft and lax.  Bowel sounds positive.  No mass or organomegaly. EXTREMITIES:  No leg edema.  ASSESSMENT: 1. Nausea, vomiting, etiology not clear, however, the patient has a     recent history of gastroenteritis. 2. Severe hypokalemia. 3. Diabetes mellitus. 4. Hypertension. 5. Hypothyroidism.  PLAN:  We will continue symptomatic treatment.  Continue potassium supplements.  We will continue to monitor electrolytes.  Continue supportive care.     Jeremih Dearmas D. Felecia Shelling, MD     TDF/MEDQ  D:  10/26/2010  T:  10/26/2010  Job:  [id number]

## 2010-10-26 NOTE — Progress Notes (Signed)
CRITICAL VALUE ALERT  Critical value received:  10/26/10  Date of notification:  10/26/2010  Time of notification:  0555  Critical value read back:yes  Nurse who received alert:  Nena Polio, RN  MD notified (1st page):  Nida  Time of first page: 0559  MD notified (2nd page):  Time of second page:  Responding MD:  Fransico Him  Time MD responded:  [phone]

## 2010-10-26 NOTE — H&P (Signed)
NAMERINIYAH, SPEICH               ACCOUNT NO.:  [id number]  MEDICAL RECORD NO.:  [id number]  LOCATION:                                 FACILITY:  PHYSICIAN:  Faige Seely L. Juanetta Gosling, M.D.DATE OF BIRTH:  19-Nov-1945  DATE OF ADMISSION: DATE OF DISCHARGE:  LH                             HISTORY & PHYSICAL   REASON FOR ADMISSION:  Recurrent nausea, vomiting and diarrhea.  HISTORY:  Ms. Rebecca Hartman is a 65 year old who was admitted to the hospital earlier this week in observation with dehydration, hypokalemia and what appeared to be a viral gastroenteritis.  She has done well initially and was markedly improved the next morning and was able to eat breakfast and a regular lunch.  She did not have any problems, so she was discharged home with the thought that she had a viral gastroenteritis that it improved with fluid repletion.  However, she has started having trouble again about 8 o'clock last night.  She was up all night with vomiting and dry heaves and eventually came to the emergency room.  In the emergency room at this time, she was noted to then be migrated, also to have hyponatremia and hypocalcemia.  She is being brought in again for IV fluids, etc. at this time and she has had recurrence of her symptoms of appendectomy, GI consult.  She ruled out for vomiting last night.  Her past medical history is positive for multiple problems including peptic ulcer disease.  She has had it recent, but either with improved. She has diabetes.  She has hypertension.  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  She has had a lot of arthritis. She has suffered with anxiety and depression.  She has been anemic because of her GI problems.  Her medications are listed in our discharge summary from last visit.  Social history, she lives at home with her husband.  She does not smoke. She does not use any alcohol.  Her family history is essentially noncontributory to this.  Her review  of systems that is mentioned is negative.  Physical examination shows that she appears to be dehydrated.  Her mucous membranes are dry.  Her vital signs are as reported.  Pupils are reactive to light and accommodation.  Nose and throat are clear.  Her neck is supple without mass.  Her chest is fairly clear with no rhonchi, wheezes or rales.  Her heart is regular without murmur, gallop or rub. Her abdomen is soft, minimally tender, diffusely bowel sounds are slightly hyperactive.  Central nervous system examination is grossly intact.  Her laboratory work shows that she is hyponatremic, hypokalemic and her white blood count is up a little bit.  Assessment then is that she has a gastrointestinal virus I think.  I am going to go ahead and check her for Clostridium difficile.  Again, I am going to ask at this time for GI consultation.  She will continue with all of her other treatments, and I will see her back.  I will be out of town over the weekend and she will be covered by Dr. Fransico Him.     Veanna Dower L. Juanetta Gosling, M.D.  ELH/MEDQ  D:  10/25/2010  T:  10/25/2010  Job:  [id number]

## 2010-10-26 NOTE — Consult Note (Signed)
Reason for Consult: Nausea and vomiting Referring Physician: Dr. Juanetta Gosling   HPI: Patient is a 65 year old Caucasian female with complicated history of peptic ulcer disease who was doing well since her last EGD and pyloric channel dilation on 10/03/2010. She recently developed a gingivitis or tooth problem and was begun on amoxicillin she took for 2-4 days prior to dental work. Her symptoms started about a week ago every time she tried to eat or drink anything she would get sick on her stomach and started heaving. She finally came to the emergency room 3 days ago and was admitted to Dr. Juanetta Gosling service. She was also experiencing diarrhea which is now resolved. She did have her stool C. difficile by PCR which was negative. Yesterday she tolerated clear liquids and she vomited she believes it was the result of broth that she took. This morning she feels much better and feels hungry and wants to eat. She complains of epigastric pain which he believes is due to a multiple episodes of heaving and vomiting she denies heartburn dysphagia hematemesis or melena. She reassures me that she has not taken any NSAID since her last EGD. Her GI history is significant she has recurrent peptic ulcer disease secondary to NSAID therapy she was found to have pyloric channel ulcer by Dr. Darrick Penna back in May 2008 She had EGD in April 2010 by me at [facility] needed reeling healing the bulbar ulcer pyloric channel inflammation with a prepyloric scar and a small sliding hiatal hernia. In March this year she presented with abdominal pain and nausea vomiting and had abnormal upper GI series revealing and she had EGD and April 2012 reeling small sliding-type hernia, deep pyloric channel ulcer, bulbar ulcer with stricture. Her H. pylori serology was repeated and was negative patient was maintained on double dose of lansoprazole and underwent repeat EGD on 10/03/2010 Schiff's small sliding hilum hernia partially healed pyloric channel ulcer with a  stricture. The stricture was dilated with a balloon to 15 mm distal bulbar ulcer had completely healed. Bulbar stricture was dilated to 15 mm as well. She has history of other deficiency anemia she received total dose iron infusion in December 2010 and January 2011.   Past Medical History  Diagnosis Date  . Hypertension   . Diabetes mellitus   . Hypothyroidism   . Fibromyalgia   . Chronic back pain     Past Surgical History  Procedure Date  . Tonsillectomy   . Cholecystectomy   . Carpal tunnel release   . Back surgery   . Total hip revision   . Abdominal hysterectomy     History reviewed. No pertinent family history.  Social History:  reports that she has never smoked. She has never used smokeless tobacco. She reports that she does not drink alcohol or use illicit drugs.  Allergies:  Allergies  Allergen Reactions  . Levaquin Other (See Comments)    unknown  . Lisinopril Other (See Comments)    unknown  . Morphine Itching and Nausea Only    unknown  . Pantoprazole Sodium (Protonix) Other (See Comments)    unknown  . Shellfish Allergy Itching    Medications: I have reviewed the patient's current medications.  Results for orders placed during the hospital encounter of 10/25/10 (from the past 48 hour(s))  COMPREHENSIVE METABOLIC PANEL     Status: Abnormal   Collection Time   10/25/10  7:43 AM      Component Value Range Comment   Sodium 125 (*) 135 -  145 (mEq/L)    Potassium 3.1 (*) 3.5 - 5.1 (mEq/L)    Chloride 89 (*) 96 - 112 (mEq/L)    CO2 19  19 - 32 (mEq/L)    Glucose, Bld 114 (*) 70 - 99 (mg/dL)    BUN 6  6 - 23 (mg/dL)    Creatinine, Ser 1.61  0.50 - 1.10 (mg/dL)    Calcium 9.1  8.4 - 10.5 (mg/dL)    Total Protein 7.1  6.0 - 8.3 (g/dL)    Albumin 3.6  3.5 - 5.2 (g/dL)    AST 25  0 - 37 (U/L)    ALT 15  0 - 35 (U/L)    Alkaline Phosphatase 80  39 - 117 (U/L)    Total Bilirubin 0.3  0.3 - 1.2 (mg/dL)    GFR calc non Af Amer >60  >60 (mL/min)    GFR calc  Af Amer >60  >60 (mL/min)   LIPASE, BLOOD     Status: Normal   Collection Time   10/25/10  7:43 AM      Component Value Range Comment   Lipase 50  11 - 59 (U/L)   CBC     Status: Abnormal   Collection Time   10/25/10  7:43 AM      Component Value Range Comment   WBC 13.1 (*) 4.0 - 10.5 (K/uL)    RBC 4.91  3.87 - 5.11 (MIL/uL)    Hemoglobin 10.9 (*) 12.0 - 15.0 (g/dL)    HCT 09.6 (*) 04.5 - 46.0 (%)    MCV 68.8 (*) 78.0 - 100.0 (fL)    MCH 22.2 (*) 26.0 - 34.0 (pg)    MCHC 32.2  30.0 - 36.0 (g/dL)    RDW 40.9 (*) 81.1 - 15.5 (%)    Platelets 524 (*) 150 - 400 (K/uL)   DIFFERENTIAL     Status: Abnormal   Collection Time   10/25/10  7:43 AM      Component Value Range Comment   Neutrophils Relative 78 (*) 43 - 77 (%)    Neutro Abs 10.2 (*) 1.7 - 7.7 (K/uL)    Lymphocytes Relative 11 (*) 12 - 46 (%)    Lymphs Abs 1.5  0.7 - 4.0 (K/uL)    Monocytes Relative 10  3 - 12 (%)    Monocytes Absolute 1.4 (*) 0.1 - 1.0 (K/uL)    Eosinophils Relative 1  0 - 5 (%)    Eosinophils Absolute 0.1  0.0 - 0.7 (K/uL)    Basophils Relative 0  0 - 1 (%)    Basophils Absolute 0.0  0.0 - 0.1 (K/uL)   GLUCOSE, CAPILLARY     Status: Abnormal   Collection Time   10/25/10  5:09 PM      Component Value Range Comment   Glucose-Capillary 130 (*) 70 - 99 (mg/dL)   GLUCOSE, CAPILLARY     Status: Abnormal   Collection Time   10/25/10 11:59 PM      Component Value Range Comment   Glucose-Capillary 124 (*) 70 - 99 (mg/dL)    Comment 1 Notify RN     BASIC METABOLIC PANEL     Status: Abnormal   Collection Time   10/26/10  4:20 AM      Component Value Range Comment   Sodium 131 (*) 135 - 145 (mEq/L)    Potassium 2.7 (*) 3.5 - 5.1 (mEq/L)    Chloride 100  96 - 112 (mEq/L)    CO2  17 (*) 19 - 32 (mEq/L)    Glucose, Bld 137 (*) 70 - 99 (mg/dL)    BUN 4 (*) 6 - 23 (mg/dL)    Creatinine, Ser 1.61  0.50 - 1.10 (mg/dL)    Calcium 8.3 (*) 8.4 - 10.5 (mg/dL)    GFR calc non Af Amer >60  >60 (mL/min)    GFR calc Af  Amer >60  >60 (mL/min)   CBC     Status: Abnormal   Collection Time   10/26/10  4:20 AM      Component Value Range Comment   WBC 9.7  4.0 - 10.5 (K/uL)    RBC 4.47  3.87 - 5.11 (MIL/uL)    Hemoglobin 9.9 (*) 12.0 - 15.0 (g/dL)    HCT 09.6 (*) 04.5 - 46.0 (%)    MCV 69.6 (*) 78.0 - 100.0 (fL)    MCH 22.1 (*) 26.0 - 34.0 (pg)    MCHC 31.8  30.0 - 36.0 (g/dL)    RDW 40.9 (*) 81.1 - 15.5 (%)    Platelets 550 (*) 150 - 400 (K/uL)   DIFFERENTIAL     Status: Abnormal   Collection Time   10/26/10  4:20 AM      Component Value Range Comment   Neutrophils Relative 70  43 - 77 (%)    Lymphocytes Relative 16  12 - 46 (%)    Monocytes Relative 13 (*) 3 - 12 (%)    Eosinophils Relative 1  0 - 5 (%)    Basophils Relative 0  0 - 1 (%)    Neutro Abs 6.7  1.7 - 7.7 (K/uL)    Lymphs Abs 1.6  0.7 - 4.0 (K/uL)    Monocytes Absolute 1.3 (*) 0.1 - 1.0 (K/uL)    Eosinophils Absolute 0.1  0.0 - 0.7 (K/uL)    Basophils Absolute 0.0  0.0 - 0.1 (K/uL)    RBC Morphology MICROCYTES     GLUCOSE, CAPILLARY     Status: Abnormal   Collection Time   10/26/10  5:50 AM      Component Value Range Comment   Glucose-Capillary 146 (*) 70 - 99 (mg/dL)   POTASSIUM     Status: Abnormal   Collection Time   10/26/10  9:35 AM      Component Value Range Comment   Potassium 3.0 (*) 3.5 - 5.1 (mEq/L)   GLUCOSE, CAPILLARY     Status: Abnormal   Collection Time   10/26/10 11:04 AM      Component Value Range Comment   Glucose-Capillary 129 (*) 70 - 99 (mg/dL)       Review of Systems  Constitutional: Positive for weight loss (5lb weight loss in one week). Negative for fever and chills.  Respiratory: Negative for shortness of breath.   Cardiovascular: Negative for chest pain.  Gastrointestinal: Negative for heartburn, diarrhea, constipation, blood in stool and melena.  Genitourinary: Negative for dysuria and hematuria.  Neurological: Positive for weakness. Negative for headaches.   Blood pressure 136/89, pulse 57,  temperature 99 F (37.2 C), temperature source Oral, resp. rate 18, height 5\' 4"  (1.626 m), SpO2 98.00%. Physical Exam  Constitutional: She appears well-developed and well-nourished.  HENT:  Mouth/Throat: Oropharynx is clear and moist.  Eyes: Conjunctivae are normal. No scleral icterus.  Neck: Neck supple. No thyromegaly present.  Cardiovascular: Normal rate, regular rhythm and normal heart sounds.   No murmur heard. Respiratory: Breath sounds normal. She has no wheezes.  GI: Soft. Bowel sounds  are normal. She exhibits no mass. There is Tenderness: milg epigastric tenderness.. There is no rebound and no guarding.  Musculoskeletal: She exhibits no edema.  Lymphadenopathy:    She has no cervical adenopathy.  Neurological: She is alert.  Skin: Skin is warm and dry.    Assessment/Plan: Patient's acute illness is possibly secondary to gastroenteritis either viral or food borne. Her acute illness may have reactivated her peptic ulcer disease. I hope she is not taking NSAIDs like she has done in the past. Her diarrhea has resolved and her C. difficile by PCR was negative. She is ready for oral feeding and if she starts having emesis again may need repeat EGD and pyloric channel dilation. Will start on full liquids today and advance to 1800-calorie modified carb diet later in the day. Patient will be reevaluated in the a.m.. We appreciate the opportunity to 5 space in the care of this nice lady.    REHMAN,NAJEEB U 10/26/2010, 3:48 PM

## 2010-10-27 LAB — GLUCOSE, CAPILLARY: Glucose-Capillary: 126 mg/dL — ABNORMAL HIGH (ref 70–99)

## 2010-10-27 LAB — BASIC METABOLIC PANEL WITH GFR
BUN: 4 mg/dL — ABNORMAL LOW (ref 6–23)
CO2: 17 meq/L — ABNORMAL LOW (ref 19–32)
Calcium: 8.1 mg/dL — ABNORMAL LOW (ref 8.4–10.5)
Chloride: 103 meq/L (ref 96–112)
Creatinine, Ser: 0.59 mg/dL (ref 0.50–1.10)
GFR calc Af Amer: >60 mL/min
GFR calc non Af Amer: >60 mL/min
Glucose, Bld: 122 mg/dL — ABNORMAL HIGH (ref 70–99)
Potassium: 2.6 meq/L — CL (ref 3.5–5.1)
Sodium: 133 meq/L — ABNORMAL LOW (ref 135–145)

## 2010-10-27 LAB — HEMATOCRIT: HCT: 28.6 % — ABNORMAL LOW (ref 36.0–46.0)

## 2010-10-27 LAB — POTASSIUM: Potassium: 3.2 mEq/L — ABNORMAL LOW (ref 3.5–5.1)

## 2010-10-27 LAB — HEMOGLOBIN: Hemoglobin: 9.2 g/dL — ABNORMAL LOW (ref 12.0–15.0)

## 2010-10-27 LAB — MAGNESIUM: Magnesium: 1.5 mg/dL (ref 1.5–2.5)

## 2010-10-27 MED ORDER — SODIUM CHLORIDE 0.9 % IJ SOLN
3.0000 mL | Freq: Two times a day (BID) | INTRAMUSCULAR | Status: DC
  Administered 2010-10-27: 3 mL via INTRAVENOUS
  Filled 2010-10-27: qty 3

## 2010-10-27 MED ORDER — DIPHENOXYLATE-ATROPINE 2.5-0.025 MG PO TABS: 1.0000 | ORAL_TABLET | Freq: Four times a day (QID) | ORAL | Status: DC | PRN

## 2010-10-27 MED ORDER — POTASSIUM CHLORIDE CRYS ER 20 MEQ PO TBCR
40.0000 meq | EXTENDED_RELEASE_TABLET | Freq: Two times a day (BID) | ORAL | Status: DC
  Administered 2010-10-27 (×2): 40 meq via ORAL
  Filled 2010-10-27 (×2): qty 2

## 2010-10-27 MED ORDER — SODIUM CHLORIDE 0.9 % IV SOLN: 250.0000 mL | INTRAVENOUS | Status: DC

## 2010-10-27 MED ORDER — SODIUM CHLORIDE 0.9 % IJ SOLN
INTRAMUSCULAR | Status: AC
  Filled 2010-10-27: qty 10

## 2010-10-27 MED ORDER — POTASSIUM CHLORIDE 10 MEQ/100ML IV SOLN
10.0000 meq | INTRAVENOUS | Status: AC
  Administered 2010-10-27 (×2): 10 meq via INTRAVENOUS
  Filled 2010-10-27 (×2): qty 100

## 2010-10-27 MED ORDER — SODIUM CHLORIDE 0.9 % IJ SOLN: 3.0000 mL | INTRAMUSCULAR | Status: DC | PRN

## 2010-10-27 NOTE — Progress Notes (Signed)
Subjective: Patient states she slept quite well last night and feels a lot better. However she had a watery stool this morning. She denies abdominal pain nausea or vomiting. She did not eat much last night but she ate most of her breakfast this morning. No melena reported.  Objective: Vital signs in last 24 hours: Temp:  [97.8 F (36.6 C)-99 F (37.2 C)] 98.2 F (36.8 C) (07/29 0600) Pulse Rate:  [57-63] 63  (07/29 0600) Resp:  [18] 18  (07/29 0600) BP: (136-153)/(72-94) 153/94 mmHg (07/29 0600) SpO2:  [96 %-99 %] 99 % (07/29 0600) Weight change:  Last BM Date: 10/24/10  PHYSICAL EXAM She appears comfortable. Abdominal exam reveals normal bowel sounds she has mild mid epigastric tenderness.  Intake/Output from previous day: 07/28 0701 - 07/29 0700 In: 2922.9 [P.O.:200; I.V.:2622.9; IV Piggyback:100] Out: 1000 [Urine:1000]  Lab Results: Hemoglobin 9.2, hematocrit 28.6, serum sodium 133, serum potassium 2.6, CO2 is 17, BUN 4, creatinine 0.59 and calcium is 8.1   Assessment; #1 nausea, vomiting has resolved and she is tolerating diabetic diet. #2 diarrhea; this has been intermittent; stool C. difficile by PCR was negative she does not appear to be toxic. Suspect it is secondary to gastroenteritis or IBS. #3 anemia. Hemoglobin down post hydration.when she goes home she needs to go back on oral iron daily.  #4 history of peptic ulcer disease. She remains on PPI. #5 hypokalemia being addressed by Dr. Felecia Shelling.   Plan Patient advised to start ferrous sulfate 1 or 2 pills a day when she goes home. We'll check serum magnesium level today.

## 2010-10-27 NOTE — Plan of Care (Signed)
Problem: Phase II Progression Outcomes Goal: Progress activity as tolerated unless otherwise ordered Outcome: Completed/Met Date Met:  10/27/10 Pt ambulated well in halls independently today

## 2010-10-27 NOTE — Progress Notes (Signed)
Rebecca Hartman, Rebecca Hartman               ACCOUNT NO.:  [id number]  MEDICAL RECORD NO.:  [id number]  LOCATION:  A309                          FACILITY:  [facility]  PHYSICIAN:  Zanita Millman D. Felecia Shelling, MD   DATE OF BIRTH:  August 30, 1945  DATE OF PROCEDURE:  10/27/2010 DATE OF DISCHARGE:                                PROGRESS NOTE   OBJECTIVE:  The patient feels better.  She had large watery diarrhea this morning.  Her nausea and vomiting has subsided.  She was able to tolerate some oral feeding.  OBJECTIVE:  GENERAL:  The patient is alert, awake, and resting.  Not in any form of distress. VITAL SIGNS:  Blood pressure 136/89, pulse rate 57, respiratory rate 18, and temperature 97.8 degrees Fahrenheit. CHEST:  Clear lung fields.  Good air entry. CARDIOVASCULAR:  Stem first and second heart sounds heard.  No murmur. No gallop. ABDOMEN:  Soft and lax.  Bowel sounds positive.  No mass or organomegaly. EXTREMITIES:  No leg edema.  ASSESSMENT: 1. Gastroenteritis. 2. Diarrhea could be secondary to the above. 3. History of peptic ulcer disease. 4. Hypertension.  PLAN:  We will continue the patient on current symptomatic treatment. If the patient continues to have diarrhea, we will start her on antidiarrheal preparation.  And will continue to advance her diet if she tolerates.     [name] D. Felecia Shelling, MD     TDF/MEDQ  D:  10/27/2010  T:  10/27/2010  Job:  [id number]

## 2010-10-28 DIAGNOSIS — D509 Iron deficiency anemia, unspecified: Secondary | ICD-10-CM | POA: Diagnosis present

## 2010-10-28 DIAGNOSIS — E871 Hypo-osmolality and hyponatremia: Secondary | ICD-10-CM | POA: Diagnosis present

## 2010-10-28 DIAGNOSIS — E876 Hypokalemia: Secondary | ICD-10-CM | POA: Diagnosis present

## 2010-10-28 DIAGNOSIS — K279 Peptic ulcer, site unspecified, unspecified as acute or chronic, without hemorrhage or perforation: Secondary | ICD-10-CM

## 2010-10-28 LAB — MAGNESIUM: Magnesium: 1.5 mg/dL (ref 1.5–2.5)

## 2010-10-28 LAB — GLUCOSE, CAPILLARY
Glucose-Capillary: 81 mg/dL (ref 70–99)
Glucose-Capillary: 82 mg/dL (ref 70–99)

## 2010-10-28 LAB — POTASSIUM: Potassium: 3.6 mEq/L (ref 3.5–5.1)

## 2010-10-28 MED ORDER — POTASSIUM CHLORIDE CRYS ER 20 MEQ PO TBCR: 40.0000 meq | EXTENDED_RELEASE_TABLET | Freq: Two times a day (BID) | ORAL | Status: DC

## 2010-10-28 MED ORDER — SODIUM CHLORIDE 0.9 % IJ SOLN: 3.0000 mL | Freq: Two times a day (BID) | INTRAMUSCULAR | Status: DC

## 2010-10-28 NOTE — Progress Notes (Signed)
Subjective: She is much improved. She has no complaints of nausea vomiting or diarrhea now. She has been ambulating. She has been able to eat without any difficulty. She had very low potassium earlier and that has been replaced  Objective: Vital signs in last 24 hours: Temp:  [98.2 F (36.8 C)-99 F (37.2 C)] 99 F (37.2 C) (07/30 0617) Pulse Rate:  [52-70] 68  (07/30 0617) Resp:  [18] 18  (07/30 0617) BP: (131-138)/(74-84) 135/74 mmHg (07/30 0617) SpO2:  [98 %-100 %] 98 % (07/30 0617) Weight change:  Last BM Date: 10/27/10  Intake/Output from previous day: 07/29 0701 - 07/30 0700 In: 1080 [P.O.:1080] Out: 600 [Urine:600]  PHYSICAL EXAM General appearance: alert, cooperative and no distress Resp: clear to auscultation bilaterally Cardio: regular rate and rhythm, S1, S2 normal, no murmur, click, rub or gallop GI: soft, non-tender; bowel sounds normal; no masses,  no organomegaly Extremities: extremities normal, atraumatic, no cyanosis or edema  Lab Results:  Basename 10/27/10 0440 10/26/10 0420  WBC -- 9.7  HGB 9.2* 9.9*  HCT 28.6* 31.1*  PLT -- 550*   BMET  Basename 10/28/10 0740 10/27/10 1652 10/27/10 0440 10/26/10 0420  NA -- -- 133* 131*  K 3.6 3.2* -- --  CL -- -- 103 100  CO2 -- -- 17* 17*  GLUCOSE -- -- 122* 137*  BUN -- -- 4* 4*  CREATININE -- -- 0.59 0.53  CALCIUM -- -- 8.1* 8.3*    Studies/Results: No results found.  Medications:  Prior to Admission:  Prescriptions prior to admission  Medication Sig Dispense Refill  . clobetasol (TEMOVATE) 0.05 % cream Apply topically 2 (two) times daily.        Marland Kitchen dicyclomine (BENTYL) 10 MG capsule Take 10-20 mg by mouth 4 (four) times daily -  before meals and at bedtime. 1-2 capsules before each meal      . diltiazem (CARDIZEM) 120 MG tablet Take 120 mg by mouth 2 (two) times daily.        Marland Kitchen estradiol (ESTRACE) 0.5 MG tablet Take 0.5 mg by mouth daily.        . furosemide (LASIX) 40 MG tablet Take 40 mg by mouth  daily.        Marland Kitchen gabapentin (NEURONTIN) 600 MG tablet Take 600 mg by mouth 3 (three) times daily.        . lansoprazole (PREVACID) 30 MG capsule Take 30 mg by mouth daily.        Marland Kitchen levothyroxine (SYNTHROID, LEVOTHROID) 50 MCG tablet Take 50 mcg by mouth daily.        Marland Kitchen lisinopril (PRINIVIL,ZESTRIL) 10 MG tablet Take 10 mg by mouth daily.        Marland Kitchen LORazepam (ATIVAN) 1 MG tablet Take 1 mg by mouth at bedtime. For sleep      . metFORMIN (GLUCOPHAGE) 500 MG tablet Take 500 mg by mouth 2 (two) times daily with a meal.       . metoprolol (LOPRESSOR) 50 MG tablet Take 50 mg by mouth 2 (two) times daily.        . ondansetron (ZOFRAN-ODT) 4 MG disintegrating tablet Take 4 mg by mouth every 8 (eight) hours as needed. For nausea       . cyclobenzaprine (FLEXERIL) 10 MG tablet Take 10 mg by mouth 2 (two) times daily as needed. For muscle spasms      . HYDROcodone-acetaminophen (NORCO) 5-325 MG per tablet Take 1 tablet by mouth 4 (four) times daily as needed.  For pain      . hydrOXYzine (ATARAX) 50 MG tablet Take 50 mg by mouth 3 (three) times daily as needed. For itching      . loperamide (IMODIUM) 2 MG capsule Take 1 capsule (2 mg total) by mouth as needed for diarrhea/loose stools.  30 capsule  5  . mupirocin (BACTROBAN) 2 % ointment Apply topically 2 (two) times daily.  60 g  5   Scheduled:   . clobetasol   Topical BID  . dicyclomine  10-20 mg Oral TID AC & HS  . diltiazem  120 mg Oral Q12H  . enoxaparin  40 mg Subcutaneous Q24H  . estradiol  0.5 mg Oral Daily  . famotidine  20 mg Oral BID  . furosemide  40 mg Oral Daily  . gabapentin  600 mg Oral TID  . levothyroxine  50 mcg Oral Daily  . lisinopril  10 mg Oral Daily  . LORazepam  1 mg Oral QHS  . metFORMIN  500 mg Oral BID WC  . metoprolol  50 mg Oral BID  . mupirocin   Topical BID  . pantoprazole  40 mg Oral Daily  . potassium chloride  10 mEq Intravenous Q1 Hr x 2  . potassium chloride  40 mEq Oral BID  . sodium chloride  3 mL Intravenous  Q12H  . sodium chloride      . sodium chloride      . DISCONTD: sodium chloride  3 mL Intravenous Q12H   Continuous:   . sodium chloride 125 mL/hr at 10/25/10 1111  . DISCONTD: sodium chloride    . DISCONTD: dextrose 5 % and 0.9% NaCl 125 mL/hr at 10/27/10 0211   ZOX:[id number], acetaminophen, calcium carbonate, cyclobenzaprine, diphenoxylate-atropine, HYDROcodone-acetaminophen, HYDROmorphone, hydrOXYzine, loperamide, ondansetron (ZOFRAN) IV, ondansetron, ondansetron, zolpidem, DISCONTD: sodium chloride  Assesment: She was admitted with an acute gastrointestinal illness it is probably viral. She had been in hospital observation for less than 24 hours improved markedly and was allowed to go home. She then developed more problems was readmitted. She has multiple other medical problems including diabetes which is well controlled she's had a hypokalemia that has been replaced, peptic ulcer disease, she is anemic because of her peptic ulcer disease and she has fibromyalgia as well as a recurrent staphylococcal infection of the skin. Active Problems:  * No active hospital problems. *     Plan: I think she's really be discharged home today. Potassium is up to 3.6 she looks much better and feels better so I will discharge her.    LOS: 3 days   Deisha Stull L 10/28/2010, 8:34 AM

## 2010-10-28 NOTE — Progress Notes (Signed)
Contacted Dr. Fransico Him last night to see if Rebecca Hartman IV fluids could be discontinued. IV site infiltrated yesterday. New site started after multiple unsuccessful attempts. Rebecca Hartman is a very hard stick. She seems to get line tangled during the night resulting in loss of IV site. No problems with fluid intake at this time. No nausea or vomiting noted at this time. Nena Polio, RN.

## 2010-10-28 NOTE — Progress Notes (Signed)
10/28/10 7829 Patient being discharged home. Reviewed discharge instructions with patient, given copy of instructions, med list, prescription, f/u appointment information. Verbalized understanding of instructions. Denied pain or discomfort at this time. IV site d/c'd and within normal limits, gauze applied. Pt in stable condition and awaiting w/c and nurse tech assist for discharge.

## 2010-10-28 NOTE — Discharge Summary (Signed)
Physician Discharge Summary  Patient ID: Rebecca Hartman MRN: [id number] DOB/AGE: 64/10/1945 64 y.o. Primary Care Physician:Marria Mathison L, MD Admit date: 10/25/2010 Discharge date: 10/28/2010    Discharge Diagnoses:  She also had hypokalemia hyponatremia and anemia Active Problems:  HIGH BLOOD PRESSURE  MRSA infection, recurrent  Hypothyroidism  Fibromyalgia  Anemia, iron deficiency  Peptic ulcer disease   Current Discharge Medication List    START taking these medications   Details  potassium chloride SA (K-DUR,KLOR-CON) 20 MEQ tablet Take 2 tablets (40 mEq total) by mouth 2 (two) times daily. Qty: 120 tablet, Refills: 12      CONTINUE these medications which have NOT CHANGED   Details  clobetasol (TEMOVATE) 0.05 % cream Apply topically 2 (two) times daily.      dicyclomine (BENTYL) 10 MG capsule Take 10-20 mg by mouth 4 (four) times daily -  before meals and at bedtime. 1-2 capsules before each meal    diltiazem (CARDIZEM) 120 MG tablet Take 120 mg by mouth 2 (two) times daily.      estradiol (ESTRACE) 0.5 MG tablet Take 0.5 mg by mouth daily.      furosemide (LASIX) 40 MG tablet Take 40 mg by mouth daily.      gabapentin (NEURONTIN) 600 MG tablet Take 600 mg by mouth 3 (three) times daily.      lansoprazole (PREVACID) 30 MG capsule Take 30 mg by mouth daily.      levothyroxine (SYNTHROID, LEVOTHROID) 50 MCG tablet Take 50 mcg by mouth daily.      lisinopril (PRINIVIL,ZESTRIL) 10 MG tablet Take 10 mg by mouth daily.      LORazepam (ATIVAN) 1 MG tablet Take 1 mg by mouth at bedtime. For sleep    metFORMIN (GLUCOPHAGE) 500 MG tablet Take 500 mg by mouth 2 (two) times daily with a meal.     metoprolol (LOPRESSOR) 50 MG tablet Take 50 mg by mouth 2 (two) times daily.      ondansetron (ZOFRAN-ODT) 4 MG disintegrating tablet Take 4 mg by mouth every 8 (eight) hours as needed. For nausea     cyclobenzaprine (FLEXERIL) 10 MG tablet Take 10 mg by mouth 2 (two) times  daily as needed. For muscle spasms    HYDROcodone-acetaminophen (NORCO) 5-325 MG per tablet Take 1 tablet by mouth 4 (four) times daily as needed. For pain    hydrOXYzine (ATARAX) 50 MG tablet Take 50 mg by mouth 3 (three) times daily as needed. For itching    loperamide (IMODIUM) 2 MG capsule Take 1 capsule (2 mg total) by mouth as needed for diarrhea/loose stools. Qty: 30 capsule, Refills: 5    mupirocin (BACTROBAN) 2 % ointment Apply topically 2 (two) times daily. Qty: 60 g, Refills: 5   Associated Diagnoses: MRSA carrier        Discharged Condition: Improved    Consults: Gastroenterology, Dr. Lionel December  Significant Diagnostic Studies: No results found.  Lab Results: Results for orders placed during the hospital encounter of 10/25/10 (from the past 24 hour(s))  GLUCOSE, CAPILLARY     Status: Abnormal   Collection Time   10/27/10 11:25 AM      Component Value Range   Glucose-Capillary 112 (*) 70 - 99 (mg/dL)  POTASSIUM     Status: Abnormal   Collection Time   10/27/10  4:52 PM      Component Value Range   Potassium 3.2 (*) 3.5 - 5.1 (mEq/L)  GLUCOSE, CAPILLARY     Status: Abnormal  Collection Time   10/27/10  6:29 PM      Component Value Range   Glucose-Capillary 114 (*) 70 - 99 (mg/dL)  OCCULT BLOOD X 1 CARD TO LAB, STOOL     Status: Normal   Collection Time   10/27/10  7:00 PM      Component Value Range   Fecal Occult Bld NEGATIVE    GLUCOSE, CAPILLARY     Status: Normal   Collection Time   10/28/10 12:05 AM      Component Value Range   Glucose-Capillary 82  70 - 99 (mg/dL)  GLUCOSE, CAPILLARY     Status: Normal   Collection Time   10/28/10  6:16 AM      Component Value Range   Glucose-Capillary 81  70 - 99 (mg/dL)   Comment 1 Notify RN    POTASSIUM     Status: Normal   Collection Time   10/28/10  7:40 AM      Component Value Range   Potassium 3.6  3.5 - 5.1 (mEq/L)  MAGNESIUM     Status: Normal   Collection Time   10/28/10  7:50 AM      Component  Value Range   Magnesium 1.5  1.5 - 2.5 (mg/dL)   Recent Results (from the past 240 hour(s))  CLOSTRIDIUM DIFFICILE BY PCR     Status: Normal   Collection Time   10/24/10  4:45 AM      Component Value Range Status Comment   C difficile by pcr NEGATIVE  NEGATIVE  Final   MRSA PCR SCREENING     Status: Normal   Collection Time   10/26/10  6:05 PM      Component Value Range Status Comment   MRSA by PCR NEGATIVE  NEGATIVE  Final      Hospital Course: She was started on IV fluids and potassium replacement. She had consultation with the GI team because this is a recurrent problem. She improved markedly over the next 72 hours and was ready for discharge by the 30th. Her potassium and sodium were better and her dehydration had improved she had no more complaints of nausea vomiting or diarrhea  Discharge Exam: Blood pressure 135/74, pulse 68, temperature 99 F (37.2 C), temperature source Oral, resp. rate 18, height 5\' 4"  (1.626 m), SpO2 98.00%. Her mucous membranes were moist her abdomen was soft bowel sounds were present active she had no tenderness.  Disposition: Home  Discharge Orders    Future Appointments: Provider: Department: Dept Phone: Center:   12/10/2010 11:30 AM Malissa Hippo, MD [facility] Lionel December [phone] None      Follow-up Information    Follow up with Salote Weidmann L in 2 weeks. (call Dr. Karilyn Cota for follow up appointment)    Contact information:   [address] [place] [address] [phone]          Signed: Fredirick Maudlin 10/28/2010, 8:41 AM

## 2010-10-28 NOTE — Progress Notes (Signed)
CRITICAL VALUE ALERT  Critical value received:  K 2.6  Date of notification:  10/28/2010  Time of notification:  0652  Critical value read back:yes  Nurse who received alert:  Loney Laurence, RN  MD notified (1st page):  Purcell Nails, MD  Time of first page:  [email or web address].Daughters  MD notified (2nd page):  Time of second page:  Responding MD:  Purcell Nails, MD  Time MD responded:  [phone]

## 2010-10-30 ENCOUNTER — Emergency Department (HOSPITAL_COMMUNITY): Payer: Medicare Other

## 2010-10-30 ENCOUNTER — Encounter (HOSPITAL_COMMUNITY): Payer: Self-pay | Admitting: *Deleted

## 2010-10-30 ENCOUNTER — Inpatient Hospital Stay (HOSPITAL_COMMUNITY)
Admission: EM | Admit: 2010-10-30 | Discharge: 2010-11-03 | DRG: 378 | Disposition: A | Discharge status: Home / Self Care | Payer: Medicare Other | Attending: Pulmonary Disease | Admitting: Pulmonary Disease

## 2010-10-30 DIAGNOSIS — Z8679 Personal history of other diseases of the circulatory system: Secondary | ICD-10-CM | POA: Diagnosis present

## 2010-10-30 DIAGNOSIS — R110 Nausea: Secondary | ICD-10-CM

## 2010-10-30 DIAGNOSIS — M549 Dorsalgia, unspecified: Secondary | ICD-10-CM | POA: Diagnosis present

## 2010-10-30 DIAGNOSIS — I10 Essential (primary) hypertension: Secondary | ICD-10-CM | POA: Diagnosis present

## 2010-10-30 DIAGNOSIS — Z22322 Carrier or suspected carrier of Methicillin resistant Staphylococcus aureus: Secondary | ICD-10-CM

## 2010-10-30 DIAGNOSIS — E119 Type 2 diabetes mellitus without complications: Secondary | ICD-10-CM | POA: Diagnosis present

## 2010-10-30 DIAGNOSIS — IMO0001 Reserved for inherently not codable concepts without codable children: Secondary | ICD-10-CM | POA: Diagnosis present

## 2010-10-30 DIAGNOSIS — K254 Chronic or unspecified gastric ulcer with hemorrhage: Principal | ICD-10-CM | POA: Diagnosis present

## 2010-10-30 DIAGNOSIS — D509 Iron deficiency anemia, unspecified: Secondary | ICD-10-CM | POA: Diagnosis present

## 2010-10-30 DIAGNOSIS — D500 Iron deficiency anemia secondary to blood loss (chronic): Secondary | ICD-10-CM | POA: Diagnosis present

## 2010-10-30 DIAGNOSIS — K219 Gastro-esophageal reflux disease without esophagitis: Secondary | ICD-10-CM

## 2010-10-30 DIAGNOSIS — K208 Other esophagitis without bleeding: Secondary | ICD-10-CM | POA: Diagnosis present

## 2010-10-30 DIAGNOSIS — E860 Dehydration: Secondary | ICD-10-CM | POA: Diagnosis present

## 2010-10-30 DIAGNOSIS — R1013 Epigastric pain: Secondary | ICD-10-CM

## 2010-10-30 DIAGNOSIS — A4902 Methicillin resistant Staphylococcus aureus infection, unspecified site: Secondary | ICD-10-CM | POA: Diagnosis not present

## 2010-10-30 DIAGNOSIS — E876 Hypokalemia: Secondary | ICD-10-CM | POA: Diagnosis not present

## 2010-10-30 DIAGNOSIS — E039 Hypothyroidism, unspecified: Secondary | ICD-10-CM | POA: Diagnosis present

## 2010-10-30 DIAGNOSIS — E1165 Type 2 diabetes mellitus with hyperglycemia: Secondary | ICD-10-CM | POA: Diagnosis present

## 2010-10-30 DIAGNOSIS — K269 Duodenal ulcer, unspecified as acute or chronic, without hemorrhage or perforation: Secondary | ICD-10-CM | POA: Diagnosis present

## 2010-10-30 DIAGNOSIS — N390 Urinary tract infection, site not specified: Secondary | ICD-10-CM

## 2010-10-30 DIAGNOSIS — K279 Peptic ulcer, site unspecified, unspecified as acute or chronic, without hemorrhage or perforation: Secondary | ICD-10-CM

## 2010-10-30 DIAGNOSIS — K859 Acute pancreatitis without necrosis or infection, unspecified: Secondary | ICD-10-CM

## 2010-10-30 DIAGNOSIS — D62 Acute posthemorrhagic anemia: Secondary | ICD-10-CM | POA: Diagnosis present

## 2010-10-30 DIAGNOSIS — E871 Hypo-osmolality and hyponatremia: Secondary | ICD-10-CM | POA: Diagnosis present

## 2010-10-30 DIAGNOSIS — G8929 Other chronic pain: Secondary | ICD-10-CM | POA: Diagnosis present

## 2010-10-30 DIAGNOSIS — M797 Fibromyalgia: Secondary | ICD-10-CM | POA: Diagnosis present

## 2010-10-30 HISTORY — DX: Anemia, unspecified: D64.9

## 2010-10-30 LAB — COMPREHENSIVE METABOLIC PANEL
ALT: 18 U/L (ref 0–35)
Albumin: 3.5 g/dL (ref 3.5–5.2)
Alkaline Phosphatase: 61 U/L (ref 39–117)
Calcium: 9.9 mg/dL (ref 8.4–10.5)
GFR calc Af Amer: 50 mL/min — ABNORMAL LOW (ref >60)
Glucose, Bld: 186 mg/dL — ABNORMAL HIGH (ref 70–99)
Potassium: 3.6 mEq/L (ref 3.5–5.1)
Sodium: 122 mEq/L — ABNORMAL LOW (ref 135–145)
Total Protein: 6.9 g/dL (ref 6.0–8.3)

## 2010-10-30 LAB — DIFFERENTIAL
Basophils Absolute: 0 10*3/uL (ref 0.0–0.1)
Eosinophils Absolute: 0 10*3/uL (ref 0.0–0.7)
Lymphocytes Relative: 7 % — ABNORMAL LOW (ref 12–46)
Monocytes Relative: 6 % (ref 3–12)
Neutrophils Relative %: 87 % — ABNORMAL HIGH (ref 43–77)

## 2010-10-30 LAB — CLOSTRIDIUM DIFFICILE BY PCR: Toxigenic C. Difficile by PCR: NEGATIVE

## 2010-10-30 LAB — CBC
HCT: 31 % — ABNORMAL LOW (ref 36.0–46.0)
MCHC: 32.6 g/dL (ref 30.0–36.0)
Platelets: 594 10*3/uL — ABNORMAL HIGH (ref 150–400)
RDW: 18 % — ABNORMAL HIGH (ref 11.5–15.5)
WBC: 25.1 10*3/uL — ABNORMAL HIGH (ref 4.0–10.5)

## 2010-10-30 LAB — OCCULT BLOOD X 1 CARD TO LAB, STOOL: Fecal Occult Bld: POSITIVE

## 2010-10-30 MED ORDER — ZOLPIDEM TARTRATE 5 MG PO TABS
5.0000 mg | ORAL_TABLET | Freq: Every evening | ORAL | Status: DC | PRN
  Administered 2010-10-30 – 2010-11-02 (×4): 5 mg via ORAL
  Filled 2010-10-30 (×4): qty 1

## 2010-10-30 MED ORDER — SODIUM CHLORIDE 0.9 % IV SOLN
INTRAVENOUS | Status: DC
  Administered 2010-10-30 – 2010-11-03 (×10): via INTRAVENOUS

## 2010-10-30 MED ORDER — DILTIAZEM HCL ER COATED BEADS 120 MG PO CP24
ORAL_CAPSULE | ORAL | Status: AC
  Administered 2010-10-30: 120 mg
  Filled 2010-10-30: qty 1

## 2010-10-30 MED ORDER — CYCLOBENZAPRINE HCL 10 MG PO TABS
10.0000 mg | ORAL_TABLET | Freq: Two times a day (BID) | ORAL | Status: DC | PRN
  Administered 2010-11-02: 10 mg via ORAL
  Filled 2010-10-30: qty 1

## 2010-10-30 MED ORDER — DICYCLOMINE HCL 10 MG PO CAPS
10.0000 mg | ORAL_CAPSULE | Freq: Three times a day (TID) | ORAL | Status: DC
  Administered 2010-10-30: 20 mg via ORAL
  Filled 2010-10-30: qty 2
  Filled 2010-10-30: qty 1

## 2010-10-30 MED ORDER — SODIUM CHLORIDE 0.9 % IV SOLN
Freq: Once | INTRAVENOUS | Status: AC
  Administered 2010-10-30: 1000 mL via INTRAVENOUS

## 2010-10-30 MED ORDER — DILTIAZEM HCL 60 MG PO TABS
120.0000 mg | ORAL_TABLET | Freq: Two times a day (BID) | ORAL | Status: DC
  Filled 2010-10-30: qty 2

## 2010-10-30 MED ORDER — LORAZEPAM 1 MG PO TABS
1.0000 mg | ORAL_TABLET | Freq: Every day | ORAL | Status: DC
  Administered 2010-10-30 – 2010-11-02 (×4): 1 mg via ORAL
  Filled 2010-10-30 (×4): qty 1

## 2010-10-30 MED ORDER — METOCLOPRAMIDE HCL 5 MG/ML IJ SOLN
10.0000 mg | Freq: Once | INTRAMUSCULAR | Status: AC
  Administered 2010-10-30: 10 mg via INTRAVENOUS

## 2010-10-30 MED ORDER — LOPERAMIDE HCL 2 MG PO CAPS
4.0000 mg | ORAL_CAPSULE | Freq: Once | ORAL | Status: AC
  Administered 2010-10-30: 4 mg via ORAL
  Filled 2010-10-30: qty 2

## 2010-10-30 MED ORDER — LISINOPRIL 10 MG PO TABS
10.0000 mg | ORAL_TABLET | Freq: Every day | ORAL | Status: DC
  Administered 2010-10-30 – 2010-11-03 (×4): 10 mg via ORAL
  Filled 2010-10-30 (×5): qty 1

## 2010-10-30 MED ORDER — SODIUM CHLORIDE 0.9 % IV BOLUS (SEPSIS)
1000.0000 mL | Freq: Once | INTRAVENOUS | Status: AC
  Administered 2010-10-30: 1000 mL via INTRAVENOUS

## 2010-10-30 MED ORDER — CLOBETASOL PROPIONATE 0.05 % EX CREA
TOPICAL_CREAM | Freq: Two times a day (BID) | CUTANEOUS | Status: DC
  Filled 2010-10-30: qty 1

## 2010-10-30 MED ORDER — ONDANSETRON HCL 4 MG/2ML IJ SOLN
4.0000 mg | Freq: Four times a day (QID) | INTRAMUSCULAR | Status: DC | PRN
  Administered 2010-10-30 – 2010-10-31 (×2): 4 mg via INTRAVENOUS
  Filled 2010-10-30 (×2): qty 2

## 2010-10-30 MED ORDER — MUPIROCIN 2 % EX OINT
TOPICAL_OINTMENT | Freq: Two times a day (BID) | CUTANEOUS | Status: DC
  Administered 2010-10-30 – 2010-11-02 (×4): via TOPICAL
  Filled 2010-10-30 (×2): qty 22

## 2010-10-30 MED ORDER — ACETAMINOPHEN 325 MG PO TABS
650.0000 mg | ORAL_TABLET | Freq: Four times a day (QID) | ORAL | Status: DC | PRN
  Administered 2010-11-01 – 2010-11-03 (×5): 650 mg via ORAL
  Filled 2010-10-30 (×5): qty 2

## 2010-10-30 MED ORDER — METOPROLOL TARTRATE 50 MG PO TABS
50.0000 mg | ORAL_TABLET | Freq: Two times a day (BID) | ORAL | Status: DC
  Administered 2010-10-30 – 2010-11-03 (×7): 50 mg via ORAL
  Filled 2010-10-30 (×8): qty 1

## 2010-10-30 MED ORDER — ONDANSETRON HCL 4 MG PO TABS: 4.0000 mg | ORAL_TABLET | Freq: Four times a day (QID) | ORAL | Status: DC | PRN

## 2010-10-30 MED ORDER — PANTOPRAZOLE SODIUM 40 MG PO TBEC
40.0000 mg | DELAYED_RELEASE_TABLET | Freq: Two times a day (BID) | ORAL | Status: DC
  Filled 2010-10-30: qty 1

## 2010-10-30 MED ORDER — ACETAMINOPHEN 650 MG RE SUPP: 650.0000 mg | Freq: Four times a day (QID) | RECTAL | Status: DC | PRN

## 2010-10-30 MED ORDER — LOPERAMIDE HCL 2 MG PO CAPS
2.0000 mg | ORAL_CAPSULE | Freq: Every day | ORAL | Status: DC | PRN
  Administered 2010-10-30 – 2010-10-31 (×2): 2 mg via ORAL
  Filled 2010-10-30 (×2): qty 1

## 2010-10-30 MED ORDER — ENOXAPARIN SODIUM 40 MG/0.4ML ~~LOC~~ SOLN
40.0000 mg | SUBCUTANEOUS | Status: DC
  Administered 2010-10-30: 40 mg via SUBCUTANEOUS
  Filled 2010-10-30: qty 0.4

## 2010-10-30 MED ORDER — LEVOTHYROXINE SODIUM 50 MCG PO TABS
50.0000 ug | ORAL_TABLET | Freq: Every day | ORAL | Status: DC
  Administered 2010-10-31 – 2010-11-03 (×4): 50 ug via ORAL
  Filled 2010-10-30 (×4): qty 1

## 2010-10-30 MED ORDER — METOCLOPRAMIDE HCL 5 MG/ML IJ SOLN
3.0000 mg | Freq: Once | INTRAMUSCULAR | Status: DC
  Filled 2010-10-30: qty 2

## 2010-10-30 MED ORDER — GABAPENTIN 300 MG PO CAPS
ORAL_CAPSULE | ORAL | Status: AC
  Administered 2010-10-30: 600 mg via ORAL
  Filled 2010-10-30: qty 2

## 2010-10-30 MED ORDER — POTASSIUM CHLORIDE CRYS ER 20 MEQ PO TBCR
40.0000 meq | EXTENDED_RELEASE_TABLET | Freq: Two times a day (BID) | ORAL | Status: DC
  Administered 2010-10-30: 40 meq via ORAL
  Filled 2010-10-30 (×2): qty 2

## 2010-10-30 MED ORDER — HYDROCODONE-ACETAMINOPHEN 5-325 MG PO TABS
1.0000 | ORAL_TABLET | Freq: Four times a day (QID) | ORAL | Status: DC | PRN
  Administered 2010-10-30 – 2010-11-02 (×6): 1 via ORAL
  Filled 2010-10-30 (×6): qty 1

## 2010-10-30 MED ORDER — GABAPENTIN 600 MG PO TABS
600.0000 mg | ORAL_TABLET | Freq: Three times a day (TID) | ORAL | Status: DC
  Filled 2010-10-30 (×5): qty 1

## 2010-10-30 MED ORDER — HYDROXYZINE HCL 25 MG PO TABS: 50.0000 mg | ORAL_TABLET | Freq: Three times a day (TID) | ORAL | Status: DC | PRN

## 2010-10-30 NOTE — ED Provider Notes (Addendum)
History   Chart scribed for Doug Sou, MD by Enos Fling; the patient was seen in room APOTF/NONE; this patient's care was started at 3:46 PM.    Chief Complaint  Patient presents with  . Nausea  . Emesis   HPI Rebecca Hartman is a 65 y.o. female who presents to the Emergency Department complaining of nausea. Pt reports nausea x 10 days. Pt with recent ED visits and hospitalization x 2 days for same complaints, n/v and abd pain has not resolved. Abd pain is in epigastrium, pt thinks d/t vomiting. Pt rx zofran, has been taking with no relief. Last episode vomiting Monday, dry heaving since then because not eating much. Pt has called Dr. Juanetta Gosling but have not spoken with him yet. Last BM 2 days ago. Pt tolerated toast this AM and some fluids today. No new complaints.  PAST MEDICAL HISTORY:  Past Medical History  Diagnosis Date  . Hypertension   . Diabetes mellitus   . Hypothyroidism   . Fibromyalgia   . Chronic back pain     PAST SURGICAL HISTORY:  Past Surgical History  Procedure Date  . Tonsillectomy   . Cholecystectomy   . Carpal tunnel release   . Back surgery   . Total hip revision   . Abdominal hysterectomy     MEDICATIONS:   Previous Medications   CLOBETASOL (TEMOVATE) 0.05 % CREAM    Apply topically 2 (two) times daily.     CYCLOBENZAPRINE (FLEXERIL) 10 MG TABLET    Take 10 mg by mouth 2 (two) times daily as needed. For muscle spasms   DICYCLOMINE (BENTYL) 10 MG CAPSULE    Take 10-20 mg by mouth 4 (four) times daily -  before meals and at bedtime. 1-2 capsules before each meal   DILTIAZEM (CARDIZEM) 120 MG TABLET    Take 120 mg by mouth 2 (two) times daily.     ESTRADIOL (ESTRACE) 0.5 MG TABLET    Take 0.5 mg by mouth daily.     FUROSEMIDE (LASIX) 40 MG TABLET    Take 40 mg by mouth daily.     GABAPENTIN (NEURONTIN) 600 MG TABLET    Take 600 mg by mouth 3 (three) times daily.     HYDROCODONE-ACETAMINOPHEN (NORCO) 5-325 MG PER TABLET    Take 1 tablet by mouth 4  (four) times daily as needed. For pain   HYDROXYZINE (ATARAX) 50 MG TABLET    Take 50 mg by mouth 3 (three) times daily as needed. For itching   LANSOPRAZOLE (PREVACID) 30 MG CAPSULE    Take 30 mg by mouth daily.     LEVOTHYROXINE (SYNTHROID, LEVOTHROID) 50 MCG TABLET    Take 50 mcg by mouth daily.     LISINOPRIL (PRINIVIL,ZESTRIL) 10 MG TABLET    Take 10 mg by mouth daily.     LOPERAMIDE (IMODIUM) 2 MG CAPSULE    Take 1 capsule (2 mg total) by mouth as needed for diarrhea/loose stools.   LORAZEPAM (ATIVAN) 1 MG TABLET    Take 1 mg by mouth at bedtime. For sleep   METFORMIN (GLUCOPHAGE) 500 MG TABLET    Take 500 mg by mouth 2 (two) times daily with a meal.    METOPROLOL (LOPRESSOR) 50 MG TABLET    Take 50 mg by mouth 2 (two) times daily.    MUPIROCIN (BACTROBAN) 2 % OINTMENT    Apply topically 2 (two) times daily.   ONDANSETRON (ZOFRAN) 4 MG TABLET    Take 4 mg  by mouth every 8 (eight) hours as needed. For nausea    ONDANSETRON (ZOFRAN-ODT) 4 MG DISINTEGRATING TABLET    Take 4 mg by mouth every 8 (eight) hours as needed. For nausea    POTASSIUM CHLORIDE SA (K-DUR,KLOR-CON) 20 MEQ TABLET    Take 2 tablets (40 mEq total) by mouth 2 (two) times daily.     ALLERGIES:   Allergies as of 10/30/2010 - Review Complete 10/30/2010  Allergen Reaction Noted  . Levaquin Other (See Comments) 09/03/2010  . Lisinopril Other (See Comments) 10/23/2010  . Morphine Itching and Nausea Only   . Pantoprazole sodium (protonix) Other (See Comments) 10/25/2010  . Shellfish allergy Itching 10/23/2010     FAMILY HISTORY:   History reviewed. No pertinent family history.   SOCIAL HISTORY: History   Social History  . Marital Status: Married    Spouse Name: N/A    Number of Children: N/A  . Years of Education: N/A   Social History Main Topics  . Smoking status: Never Smoker   . Smokeless tobacco: Never Used  . Alcohol Use: No  . Drug Use: No  . Sexually Active: Yes    Birth Control/ Protection:  Post-menopausal   Other Topics Concern  . None   Social History Narrative  . None      Review of Systems  Constitutional: Positive for appetite change.  HENT: Negative.   Respiratory: Negative.   Cardiovascular: Negative.   Gastrointestinal: Positive for nausea, vomiting and abdominal pain. Negative for diarrhea.  Musculoskeletal: Negative.   Skin: Negative.   Neurological: Positive for weakness.  Hematological: Negative.   Psychiatric/Behavioral: Negative.     Physical Exam  BP 106/88  Pulse 120  Temp(Src) 97.3 F (36.3 C) (Oral)  Resp 17  Ht 5' 4.25" (1.632 m)  Wt 170 lb (77.111 kg)  BMI 28.95 kg/m2  SpO2 100%  Physical Exam  Constitutional: She appears well-developed and well-nourished.  HENT:  Head: Normocephalic and atraumatic.  Eyes: Conjunctivae are normal. Pupils are equal, round, and reactive to light.       Mucous membranes dry  Neck: Neck supple. No tracheal deviation present. No thyromegaly present.  Cardiovascular: Normal rate and regular rhythm.   No murmur heard. Pulmonary/Chest: Effort normal and breath sounds normal. She exhibits no tenderness.  Abdominal: Soft. Bowel sounds are normal. She exhibits no distension. There is tenderness. There is no guarding.       Tender at epigastrium  Musculoskeletal: Normal range of motion. She exhibits no edema and no tenderness.  Neurological: She is alert. Coordination normal.  Skin: Skin is warm and dry. No rash noted.  Psychiatric: She has a normal mood and affect.    ED Course  Procedures OTHER DATA REVIEWED: Nursing notes, vital signs, and past medical records reviewed.   LABS / RADIOLOGY:  Results for orders placed during the hospital encounter of 10/30/10  CBC      Component Value Range   WBC 25.1 (*) 4.0 - 10.5 (K/uL)   RBC 4.45  3.87 - 5.11 (MIL/uL)   Hemoglobin 10.1 (*) 12.0 - 15.0 (g/dL)   HCT 40.9 (*) 81.1 - 46.0 (%)   MCV 69.7 (*) 78.0 - 100.0 (fL)   MCH 22.7 (*) 26.0 - 34.0 (pg)    MCHC 32.6  30.0 - 36.0 (g/dL)   RDW 91.4 (*) 78.2 - 15.5 (%)   Platelets 594 (*) 150 - 400 (K/uL)  DIFFERENTIAL      Component Value Range   Neutrophils Relative  PENDING  43 - 77 (%)   Neutro Abs PENDING  1.7 - 7.7 (K/uL)   Band Neutrophils PENDING  0 - 10 (%)   Lymphocytes Relative PENDING  12 - 46 (%)   Lymphs Abs PENDING  0.7 - 4.0 (K/uL)   Monocytes Relative PENDING  3 - 12 (%)   Monocytes Absolute PENDING  0.1 - 1.0 (K/uL)   Eosinophils Relative PENDING  0 - 5 (%)   Eosinophils Absolute PENDING  0.0 - 0.7 (K/uL)   Basophils Relative PENDING  0 - 1 (%)   Basophils Absolute PENDING  0.0 - 0.1 (K/uL)   WBC Morphology PENDING     RBC Morphology PENDING     Smear Review PENDING     nRBC PENDING  0 (/100 WBC)   Metamyelocytes Relative PENDING     Myelocytes PENDING     Promyelocytes Absolute PENDING     Blasts PENDING    COMPREHENSIVE METABOLIC PANEL      Component Value Range   Sodium 122 (*) 135 - 145 (mEq/L)   Potassium 3.6  3.5 - 5.1 (mEq/L)   Chloride 82 (*) 96 - 112 (mEq/L)   CO2 17 (*) 19 - 32 (mEq/L)   Glucose, Bld 186 (*) 70 - 99 (mg/dL)   BUN 34 (*) 6 - 23 (mg/dL)   Creatinine, Ser 1.61 (*) 0.50 - 1.10 (mg/dL)   Calcium 9.9  8.4 - 09.6 (mg/dL)   Total Protein 6.9  6.0 - 8.3 (g/dL)   Albumin 3.5  3.5 - 5.2 (g/dL)   AST 15  0 - 37 (U/L)   ALT 18  0 - 35 (U/L)   Alkaline Phosphatase 61  39 - 117 (U/L)   Total Bilirubin 0.4  0.3 - 1.2 (mg/dL)   GFR calc non Af Amer 41 (*) >60 (mL/min)   GFR calc Af Amer 50 (*) >60 (mL/min)  LIPASE, BLOOD      Component Value Range   Lipase 91 (*) 11 - 59 (U/L)       ED COURSE / COORDINATION OF CARE: 4:45 pm - Pt c/o diarrhea in ED, requesting medicine, will order immodium  MDM: Pt with several previous visits for persistent nausea and abd pain. Will consult Dr. Juanetta Gosling for plan.  IMPRESSION: Nausea  Abdominal pain, epigastric  Hyponatremia  Acute pancreatitis     PLAN: Admission The patient is to return the  emergency department if there is any worsening of symptoms. I have reviewed the discharge instructions with the pt and family.   CONDITION ON DISCHARGE: Stable   MEDICATIONS GIVEN IN THE E.D.  Medications  ondansetron (ZOFRAN) 4 MG tablet (not administered)  0.9 %  sodium chloride infusion (not administered)  sodium chloride 0.9 % bolus 1,000 mL (1000 mL Intravenous Given 10/30/10 1648)  metoCLOPramide (REGLAN) injection 10 mg (10 mg Intravenous Given 10/30/10 1648)  loperamide (IMODIUM) capsule 4 mg (4 mg Oral Given 10/30/10 1743)     DISCHARGE MEDICATIONS: New Prescriptions   No medications on file      I personally performed the services described in this documentation, which was scribed in my presence. The recorded information has been reviewed and considered.    Doug Sou, MD 11/18/10 0454  Doug Sou, MD 12/08/10 [phone]

## 2010-10-30 NOTE — ED Notes (Signed)
Pt had large bm. Dark red stool noted. MD notified

## 2010-10-30 NOTE — H&P (Signed)
Rebecca Hartman MRN: [id number] DOB/AGE: 64/05/1945 64 y.o. Primary Care Physician:Lindell Renfrew L, MD Admit date: 10/30/2010 Chief Complaint: nausea and diarrhea HPI: she has been in and out of the hospital twice already with similar complaints. She went home on the 30th and seemed to be feeling very well. She was able to eat she was sitting up and she had no complaints at that point. However she says that last night she started having nausea and dry heaves and she started having diarrhea today. She is not sure she's had any fever. He's had some abdominal pain as well.  Past Medical History  Diagnosis Date  . Hypertension   . Diabetes mellitus   . Hypothyroidism   . Fibromyalgia   . Chronic back pain   . Anemia    Recurrent abdominal discomfort nausea vomiting and diarrhea     History reviewed. No pertinent family history. She does not know of any history of any sort of GI problems. There is a history of COPD and depression. Social History:  reports that she has never smoked. She has never used smokeless tobacco. She reports that she does not drink alcohol or use illicit drugs. She lives at home with her husband  Allergies:  Allergies  Allergen Reactions  . Levaquin Other (See Comments)    unknown  . Lisinopril Other (See Comments)    unknown  . Morphine Itching and Nausea Only    unknown  . Pantoprazole Sodium (Protonix) Other (See Comments)    unknown  . Shellfish Allergy Itching    Medications Prior to Admission  Medication Dose Route Frequency Provider Last Rate Last Dose  . 0.9 %  sodium chloride infusion   Intravenous Once Doug Sou, MD 125 mL/hr at 10/30/10 1645 1,000 mL at 10/30/10 1645  . 0.9 %  sodium chloride infusion   Intravenous Continuous Fredirick Maudlin      . acetaminophen (TYLENOL) tablet 650 mg  650 mg Oral Q6H PRN Fredirick Maudlin       Or  . acetaminophen (TYLENOL) suppository 650 mg  650 mg Rectal Q6H PRN Fredirick Maudlin      . clobetasol  (TEMOVATE) 0.05 % cream   Topical BID Fredirick Maudlin      . cyclobenzaprine (FLEXERIL) tablet 10 mg  10 mg Oral BID PRN Fredirick Maudlin      . dicyclomine (BENTYL) capsule 10-20 mg  10-20 mg Oral TID AC & HS Fredirick Maudlin      . diltiazem (CARDIZEM) tablet 120 mg  120 mg Oral BID Fredirick Maudlin      . enoxaparin (LOVENOX) injection 40 mg  40 mg Subcutaneous Q24H Fredirick Maudlin      . gabapentin (NEURONTIN) tablet 600 mg  600 mg Oral TID Fredirick Maudlin      . HYDROcodone-acetaminophen (NORCO) 5-325 MG per tablet 1 tablet  1 tablet Oral QID PRN Fredirick Maudlin      . hydrOXYzine (ATARAX) tablet 50 mg  50 mg Oral TID PRN Fredirick Maudlin      . levothyroxine (SYNTHROID, LEVOTHROID) tablet 50 mcg  50 mcg Oral Q0600 Fredirick Maudlin      . lisinopril (PRINIVIL,ZESTRIL) tablet 10 mg  10 mg Oral Daily Fredirick Maudlin      . loperamide (IMODIUM) capsule 2 mg  2 mg Oral Daily PRN Fredirick Maudlin      . loperamide (IMODIUM) capsule 4 mg  4 mg Oral Once  Doug Sou, MD   4 mg at 10/30/10 1743  . LORazepam (ATIVAN) tablet 1 mg  1 mg Oral QHS Fredirick Maudlin      . metoCLOPramide (REGLAN) injection 10 mg  10 mg Intravenous Once Doug Sou, MD   10 mg at 10/30/10 1648  . metoprolol (LOPRESSOR) tablet 50 mg  50 mg Oral BID Fredirick Maudlin      . mupirocin (BACTROBAN) 2 % ointment   Topical BID Fredirick Maudlin      . ondansetron (ZOFRAN) tablet 4 mg  4 mg Oral Q6H PRN Fredirick Maudlin       Or  . ondansetron [facility]) injection 4 mg  4 mg Intravenous Q6H PRN Fredirick Maudlin      . potassium chloride SA (K-DUR,KLOR-CON) CR tablet 40 mEq  40 mEq Oral BID Fredirick Maudlin      . sodium chloride 0.9 % bolus 1,000 mL  1,000 mL Intravenous Once Doug Sou, MD   1,000 mL at 10/30/10 1648  . zolpidem (AMBIEN) tablet 5 mg  5 mg Oral QHS PRN Fredirick Maudlin      . DISCONTD: metoCLOPramide (REGLAN) injection 3 mg  3 mg Intravenous Once Doug Sou, MD       Medications Prior to  Admission  Medication Sig Dispense Refill  . clobetasol (TEMOVATE) 0.05 % cream Apply topically 2 (two) times daily.        . cyclobenzaprine (FLEXERIL) 10 MG tablet Take 10 mg by mouth 2 (two) times daily as needed. For muscle spasms      . diltiazem (CARDIZEM) 120 MG tablet Take 120 mg by mouth 2 (two) times daily.        . furosemide (LASIX) 40 MG tablet Take 40 mg by mouth daily.        Marland Kitchen gabapentin (NEURONTIN) 600 MG tablet Take 600 mg by mouth 3 (three) times daily.        Marland Kitchen HYDROcodone-acetaminophen (NORCO) 5-325 MG per tablet Take 1 tablet by mouth 4 (four) times daily as needed. For pain      . hydrOXYzine (ATARAX) 50 MG tablet Take 50 mg by mouth 3 (three) times daily as needed. For itching      . lansoprazole (PREVACID) 30 MG capsule Take 30 mg by mouth daily.        Marland Kitchen levothyroxine (SYNTHROID, LEVOTHROID) 50 MCG tablet Take 50 mcg by mouth daily.        Marland Kitchen loperamide (IMODIUM) 2 MG capsule Take 1 capsule (2 mg total) by mouth as needed for diarrhea/loose stools.  30 capsule  5  . LORazepam (ATIVAN) 1 MG tablet Take 1 mg by mouth at bedtime. For sleep      . metFORMIN (GLUCOPHAGE) 500 MG tablet Take 500 mg by mouth 2 (two) times daily with a meal.       . metoprolol (LOPRESSOR) 50 MG tablet Take 50 mg by mouth 2 (two) times daily.       . ondansetron (ZOFRAN-ODT) 4 MG disintegrating tablet Take 4 mg by mouth every 8 (eight) hours as needed. For nausea       . potassium chloride SA (K-DUR,KLOR-CON) 20 MEQ tablet Take 2 tablets (40 mEq total) by mouth 2 (two) times daily.  120 tablet  12  . dicyclomine (BENTYL) 10 MG capsule Take 10-20 mg by mouth 4 (four) times daily -  before meals and at bedtime. 1-2 capsules before each meal      .  estradiol (ESTRACE) 0.5 MG tablet Take 0.5 mg by mouth daily.        Marland Kitchen lisinopril (PRINIVIL,ZESTRIL) 10 MG tablet Take 10 mg by mouth daily.        . mupirocin (BACTROBAN) 2 % ointment Apply topically 2 (two) times daily.  60 g  5       ZOX:WRUEA from  the symptoms mentioned above,there are no other symptoms referable to all systems reviewed.  Physical Exam: Blood pressure 143/82, pulse 112, temperature 97.3 F (36.3 C), temperature source Oral, resp. rate 20, height 5\' 4"  (1.626 m), weight 70.1 kg (154 lb 8.7 oz), SpO2 100.00%. She looks acutely ill. She is awake and alert. Her mucous membranes are dry. Her neck is supple without masses. Her chest is clear without wheezes rales or rhonchi. Her heart is regular without murmur gallop or rub. Her abdomen is soft without masses. Bowel sounds are hyperactive and she somewhat tender diffusely.her extremities show no clubbing cyanosis or edema. Central nervous system examination is grossly intact  Basic Metabolic Panel:  Basename 10/30/10 1646 10/28/10 0750 10/28/10 0740  NA 122* -- --  K 3.6 -- 3.6  CL 82* -- --  CO2 17* -- --  GLUCOSE 186* -- --  BUN 34* -- --  CREATININE 1.30* -- --  CALCIUM 9.9 -- --  MG -- 1.5 --  PHOS -- -- --   Liver Function Tests:  [facility] 10/30/10 1646  AST 15  ALT 18  ALKPHOS 61  BILITOT 0.4  PROT 6.9  ALBUMIN 3.5    Basename 10/30/10 1646  LIPASE 91*  AMYLASE --   CBC:  Basename 10/30/10 1646  WBC 25.1*  NEUTROABS 21.8*  HGB 10.1*  HCT 31.0*  MCV 69.7*  PLT 594*   Cardiac Enzymes: No results found for this basename: CKTOTAL:3,CKMB:3,CKMBINDEX:3,TROPONINI:3 in the last 72 hours BNP: No results found for this basename: POCBNP:3 in the last 72 hours D-Dimer: No results found for this basename: DDIMER:2 in the last 72 hours CBG:  Basename 10/28/10 0616 10/28/10 0005  GLUCAP 81 82   Hemoglobin A1C: No results found for this basename: HGBA1C in the last 72 hours Fasting Lipid Panel: No results found for this basename: CHOL,HDL,LDLCALC,TRIG,CHOLHDL,LDLDIRECT in the last 72 hours Thyroid Function Tests: No results found for this basename: TSH,T4TOTAL,FREET4,T3FREE,THYROIDAB in the last 72 hours Anemia Panel: No results found for this  basename: VITAMINB12,FOLATE,FERRITIN,TIBC,IRON,RETICCTPCT in the last 72 hours Urine Drug Screen: Urinalysis: Misc. Labs:  Recent Results (from the past 240 hour(s))  CLOSTRIDIUM DIFFICILE BY PCR     Status: Normal   Collection Time   10/24/10  4:45 AM      Component Value Range Status Comment   C difficile by pcr NEGATIVE  NEGATIVE  Final   MRSA PCR SCREENING     Status: Normal   Collection Time   10/26/10  6:05 PM      Component Value Range Status Comment   MRSA by PCR NEGATIVE  NEGATIVE  Final     Dg Abd Acute W/chest  10/30/2010  *RADIOLOGY REPORT*  Clinical Data: Abdominal pain  ACUTE ABDOMEN SERIES (ABDOMEN 2 VIEW & CHEST 1 VIEW)  Comparison: Chest radiograph dated 05/12/2009.  Findings: Lungs are clear. No pleural effusion or pneumothorax.  Cardiomediastinal silhouette is within normal limits.  Nonobstructive bowel gas pattern.  No evidence of free air under the diaphragm on the upright view.  Cholecystectomy clips.  Degenerative changes of the visualized thoracolumbar spine.  Prior posterior surgical fixation at  L5 and S1.  Right hip arthroplasty.  IMPRESSION: No evidence of acute cardiopulmonary disease.  No evidence of small bowel obstruction or free air.  Postsurgical changes, as described above.  Original Report Authenticated By: Charline Bills, M.D.   Impression: She has another episode of nausea vomiting and diarrhea. She has known peptic ulcer disease. She is hyponatremic again. I believe she is dehydrated again. She has other medical problems as listed. Active Problems:  * No active hospital problems. *      Plan: I discussed her situation with Dr. Karilyn Cota and he plans to do an endoscopy on her tomorrow. She's going to have IV fluids anti-emetics et Karie Soda.      Barb Shear L 10/30/2010, 7:40 PM

## 2010-10-30 NOTE — ED Notes (Signed)
Pt c/o n/v, has had two recent admission for same, came home last admission on Monday, still not eating as much, weakness, c/o abd pain

## 2010-10-31 ENCOUNTER — Encounter (HOSPITAL_COMMUNITY)
Admission: EM | Disposition: A | Discharge status: Home / Self Care | Payer: Self-pay | Source: Home / Self Care | Attending: Pulmonary Disease

## 2010-10-31 ENCOUNTER — Encounter (HOSPITAL_COMMUNITY): Payer: Self-pay | Admitting: *Deleted

## 2010-10-31 DIAGNOSIS — K250 Acute gastric ulcer with hemorrhage: Secondary | ICD-10-CM

## 2010-10-31 DIAGNOSIS — R112 Nausea with vomiting, unspecified: Secondary | ICD-10-CM

## 2010-10-31 DIAGNOSIS — K449 Diaphragmatic hernia without obstruction or gangrene: Secondary | ICD-10-CM

## 2010-10-31 DIAGNOSIS — D649 Anemia, unspecified: Secondary | ICD-10-CM

## 2010-10-31 DIAGNOSIS — K208 Other esophagitis without bleeding: Secondary | ICD-10-CM

## 2010-10-31 DIAGNOSIS — K921 Melena: Secondary | ICD-10-CM

## 2010-10-31 HISTORY — PX: ESOPHAGOGASTRODUODENOSCOPY: SHX5428

## 2010-10-31 LAB — BASIC METABOLIC PANEL
BUN: 30 mg/dL — ABNORMAL HIGH (ref 6–23)
CO2: 17 mEq/L — ABNORMAL LOW (ref 19–32)
Calcium: 8.7 mg/dL (ref 8.4–10.5)
Glucose, Bld: 117 mg/dL — ABNORMAL HIGH (ref 70–99)
Sodium: 125 mEq/L — ABNORMAL LOW (ref 135–145)

## 2010-10-31 LAB — CBC
MCH: 22.3 pg — ABNORMAL LOW (ref 26.0–34.0)
MCHC: 32.3 g/dL (ref 30.0–36.0)
MCV: 69.1 fL — ABNORMAL LOW (ref 78.0–100.0)
Platelets: 498 10*3/uL — ABNORMAL HIGH (ref 150–400)
RBC: 3.72 MIL/uL — ABNORMAL LOW (ref 3.87–5.11)

## 2010-10-31 SURGERY — EGD (ESOPHAGOGASTRODUODENOSCOPY)
Anesthesia: Moderate Sedation

## 2010-10-31 MED ORDER — SODIUM CHLORIDE 0.9 % IJ SOLN
INTRAMUSCULAR | Status: AC
  Administered 2010-10-31: 01:00:00
  Filled 2010-10-31: qty 10

## 2010-10-31 MED ORDER — SUCRALFATE 1 GM/10ML PO SUSP
1.0000 g | Freq: Three times a day (TID) | ORAL | Status: DC
  Administered 2010-10-31 – 2010-11-03 (×12): 1 g via ORAL
  Filled 2010-10-31 (×12): qty 10

## 2010-10-31 MED ORDER — MEPERIDINE HCL 50 MG/ML IJ SOLN
INTRAMUSCULAR | Status: AC
  Filled 2010-10-31: qty 1

## 2010-10-31 MED ORDER — ONDANSETRON HCL 4 MG/2ML IJ SOLN
4.0000 mg | INTRAMUSCULAR | Status: DC | PRN
  Administered 2010-10-31 (×3): 4 mg via INTRAVENOUS
  Filled 2010-10-31 (×3): qty 2

## 2010-10-31 MED ORDER — BUTAMBEN-TETRACAINE-BENZOCAINE 2-2-14 % EX AERO
INHALATION_SPRAY | CUTANEOUS | Status: DC | PRN
  Administered 2010-10-31: 1 via TOPICAL

## 2010-10-31 MED ORDER — MIDAZOLAM HCL 5 MG/5ML IJ SOLN
INTRAMUSCULAR | Status: AC
  Filled 2010-10-31: qty 10

## 2010-10-31 MED ORDER — DILTIAZEM HCL ER COATED BEADS 120 MG PO CP24
120.0000 mg | ORAL_CAPSULE | Freq: Two times a day (BID) | ORAL | Status: DC
  Administered 2010-10-31 – 2010-11-03 (×6): 120 mg via ORAL
  Filled 2010-10-31 (×6): qty 1

## 2010-10-31 MED ORDER — SODIUM CHLORIDE 0.9 % IJ SOLN
INTRAMUSCULAR | Status: AC
  Administered 2010-10-31: 10 mL
  Filled 2010-10-31: qty 10

## 2010-10-31 MED ORDER — MEPERIDINE HCL 25 MG/ML IJ SOLN
INTRAMUSCULAR | Status: DC | PRN
  Administered 2010-10-31 (×2): 25 mg via INTRAVENOUS

## 2010-10-31 MED ORDER — CLOBETASOL PROPIONATE 0.05 % EX OINT
TOPICAL_OINTMENT | Freq: Two times a day (BID) | CUTANEOUS | Status: DC
  Administered 2010-11-01 – 2010-11-02 (×2): via TOPICAL
  Filled 2010-10-31 (×2): qty 15

## 2010-10-31 MED ORDER — FERUMOXYTOL INJECTION 510 MG/17 ML
510.0000 mg | Freq: Once | INTRAVENOUS | Status: AC
  Administered 2010-10-31: 510 mg via INTRAVENOUS
  Filled 2010-10-31: qty 17

## 2010-10-31 MED ORDER — OXYMETAZOLINE HCL 0.05 % NA SOLN
NASAL | Status: AC
  Filled 2010-10-31: qty 15

## 2010-10-31 MED ORDER — PANTOPRAZOLE SODIUM 40 MG IV SOLR
40.0000 mg | Freq: Two times a day (BID) | INTRAVENOUS | Status: AC
  Administered 2010-10-31 – 2010-11-01 (×4): 40 mg via INTRAVENOUS
  Filled 2010-10-31 (×4): qty 40

## 2010-10-31 MED ORDER — MIDAZOLAM HCL 5 MG/5ML IJ SOLN
INTRAMUSCULAR | Status: DC | PRN
  Administered 2010-10-31: 1 mg via INTRAVENOUS
  Administered 2010-10-31 (×3): 2 mg via INTRAVENOUS

## 2010-10-31 MED ORDER — ONDANSETRON HCL 4 MG PO TABS: 4.0000 mg | ORAL_TABLET | Freq: Four times a day (QID) | ORAL | Status: DC | PRN

## 2010-10-31 MED ORDER — GABAPENTIN 300 MG PO CAPS
600.0000 mg | ORAL_CAPSULE | Freq: Three times a day (TID) | ORAL | Status: DC
  Administered 2010-10-30 – 2010-11-03 (×9): 600 mg via ORAL
  Filled 2010-10-31: qty 2
  Filled 2010-10-31: qty 1
  Filled 2010-10-31 (×8): qty 2
  Filled 2010-10-31: qty 1

## 2010-10-31 NOTE — Op Note (Signed)
EGD PROCEDURE REPORT  PATIENT:  Rebecca Hartman  MR#:  [id number] Birthdate:  Jul 21, 1945, 65 y.o., female Endoscopist:  Dr. Malissa Hippo, MD Referred By:  Dr. Juanetta Gosling Procedure Date: 10/31/2010  Procedure:   EGD  Indications:  Nausea, vomiting & GI Bleed.  Known hx complicated PUD.        Informed Consent:  The risks, benefits, alternatives & imponderables which include, but are not limited to, bleeding, infection, perforation, drug reaction and potential missed lesion have been reviewed.  The potential for biopsy, lesion removal, esophageal dilation, etc. have also been discussed.  Questions have been answered.  All parties agreeable.  Please see history & physical in medical record for more information.  Medications:  Demerol 50mg  IV Versed 7mg  IV Cetacaine spray for oropharyngeal topical anesthesia  Description of procedure:  The endoscope was introduced through the mouth and advanced to the second portion of the duodenum without difficulty or limitations. The mucosal surfaces were surveyed very carefully during advancement of the scope and upon withdrawal.  Findings:  Esophagus:  Superficial ulceration involving distal 2 cm of esophageal mucosa new since EGD of 10/03/2010.  GEJ @33cm .  Hiatus @37cm . Stomach:  Stomach was empty without food debris or clots. Pyloric channel narrowed with friable mucosa and ulcer extending into the bulb along the medial wall. This ulcer had clots or blood vessel to its distal margin; it was sealed with gold probe. Duodenum:  Noncritical narrowing to distal semi-lunar-shaped ulcer with clean base. Post bulbar duodenum mucosa normal.  Therapeutic/Diagnostic Maneuvers Performed:  See above  Complications:  None.  Impression:   #1 ulcerative esophagitis new since EGD of 4 weeks ago. #2 small sliding hiatal hernia. #3 pyloric channel ulcer extending into the bulb with stigmata of bleed. Site coagulated with gold probe. #4 distal bulbar ulcer with  noncritical narrowing  Recommendations:   Clear liquids. Change change PPI to IV route. Fasting gastrin. Discontinue oral KCl. Fasting cortisol level. Feraheme 510 mg IV today.   Cira Deyoe U  10/31/2010  10:49 AM  CC: Dr. Fredirick Maudlin, MD

## 2010-10-31 NOTE — H&P (Signed)
  This note is an addendum to my H&P and consultation note from 10/26/2010. Patient was hospitalized for nausea vomiting and diarrhea it was felt that she had gastroenteritis. She was discharged on 10/28/2010. She got sick that evening with nausea vomiting and diarrhea came back she was hospitalized yesterday to Dr. Juanetta Gosling service Her acute abdominal series is unremarkable; pertinent for her hyponatremia and low hemoglobin and she is also noted some bright blood with a bowel movement yesterday. She has a complicated GI history which is reviewed in my last consult note and will not be repeated. She is undergoing EGD possible stricture dilation if indicated BP 161/75  Pulse 98  Temp(Src) 97.8 F (36.6 C) (Oral)  Resp 18  Ht 5\' 4"  (1.626 m)  Wt 154 lb 8.7 oz (70.1 kg)  BMI 26.53 kg/m2  SpO2 100% Conjunctiva is pale sclerae nonicteric oropharyngeal mucosa is dry no neck masses are noted Heart regular rhythm normal S1 and S2. No murmur noted. Lungs are clear to auscultation. Abdomen is soft with mild midepigastric tenderness. Assessment; Recurrent nausea and vomiting in a patient with history of peptic ulcer disease and pyloric and duodenal stenosis. Acute on chronic iron deficiency anemia. Oral iron therapy has not worked in the past. Plan. EGD. Stool studies. Parenteral iron therapy.

## 2010-10-31 NOTE — Progress Notes (Signed)
Subjective: She continues to have some abdominal pain and nausea and diarrhea. She is negative for C. difficile and her lab work  is as noted.  Objective: Vital signs in last 24 hours: Temp:  [97.3 F (36.3 C)-98.4 F (36.9 C)] 98.4 F (36.9 C) (08/02 0532) Pulse Rate:  [82-124] 82  (08/02 0532) Resp:  [17-20] 19  (08/02 0532) BP: (106-143)/(75-88) 143/75 mmHg (08/02 0532) SpO2:  [98 %-100 %] 98 % (08/02 0532) Weight:  [70.1 kg (154 lb 8.7 oz)-77.111 kg (170 lb)] 154 lb 8.7 oz (70.1 kg) (08/01 1810) Weight change:  Last BM Date: 10/30/10  Intake/Output from previous day: 08/01 0701 - 08/02 0700 In: 1913.2 [P.O.:680; I.V.:1229.2; IV Piggyback:4] Out: -   PHYSICAL EXAM General appearance: alert, cooperative, moderate distress and moderately obese Resp: clear to auscultation bilaterally Cardio: regular rate and rhythm, S1, S2 normal, no murmur, click, rub or gallop GI: Diffusely tender with hyperactive bowel sounds Extremities: extremities normal, atraumatic, no cyanosis or edema  Lab Results:    Basic Metabolic Panel:  Basename 10/31/10 0544 10/30/10 1646  NA 125* 122*  K 3.6 3.6  CL 90* 82*  CO2 17* 17*  GLUCOSE 117* 186*  BUN 30* 34*  CREATININE 0.75 1.30*  CALCIUM 8.7 9.9  MG -- --  PHOS -- --   Liver Function Tests:  [facility] 10/30/10 1646  AST 15  ALT 18  ALKPHOS 61  BILITOT 0.4  PROT 6.9  ALBUMIN 3.5    Basename 10/30/10 1646  LIPASE 91*  AMYLASE --   CBC:  Basename 10/31/10 0544 10/30/10 1646  WBC 14.4* 25.1*  NEUTROABS -- 21.8*  HGB 8.3* 10.1*  HCT 25.7* 31.0*  MCV 69.1* 69.7*  PLT 498* 594*   Cardiac Enzymes: No results found for this basename: CKTOTAL:3,CKMB:3,CKMBINDEX:3,TROPONINI:3 in the last 72 hours BNP: No results found for this basename: POCBNP:3 in the last 72 hours D-Dimer: No results found for this basename: DDIMER:2 in the last 72 hours CBG: No results found for this basename: GLUCAP:6 in the last 72 hours Hemoglobin  A1C: No results found for this basename: HGBA1C in the last 72 hours Fasting Lipid Panel: No results found for this basename: CHOL,HDL,LDLCALC,TRIG,CHOLHDL,LDLDIRECT in the last 72 hours Thyroid Function Tests: No results found for this basename: TSH,T4TOTAL,FREET4,T3FREE,THYROIDAB in the last 72 hours Anemia Panel: No results found for this basename: VITAMINB12,FOLATE,FERRITIN,TIBC,IRON,RETICCTPCT in the last 72 hours Urine Drug Screen:  Urinalysis:  Misc. Labs:  ABGS No results found for this basename: PHART,PCO2,PO2ART,TCO2,HCO3 in the last 72 hours CULTURES Recent Results (from the past 240 hour(s))  CLOSTRIDIUM DIFFICILE BY PCR     Status: Normal   Collection Time   10/24/10  4:45 AM      Component Value Range Status Comment   C difficile by pcr NEGATIVE  NEGATIVE  Final   MRSA PCR SCREENING     Status: Normal   Collection Time   10/26/10  6:05 PM      Component Value Range Status Comment   MRSA by PCR NEGATIVE  NEGATIVE  Final   MRSA PCR SCREENING     Status: Normal   Collection Time   10/30/10  7:23 PM      Component Value Range Status Comment   MRSA by PCR NEGATIVE  NEGATIVE  Final   CLOSTRIDIUM DIFFICILE BY PCR     Status: Normal   Collection Time   10/30/10  7:24 PM      Component Value Range Status Comment   C  difficile by pcr NEGATIVE  NEGATIVE  Final    Studies/Results: Dg Abd Acute W/chest  10/30/2010  *RADIOLOGY REPORT*  Clinical Data: Abdominal pain  ACUTE ABDOMEN SERIES (ABDOMEN 2 VIEW & CHEST 1 VIEW)  Comparison: Chest radiograph dated 05/12/2009.  Findings: Lungs are clear. No pleural effusion or pneumothorax.  Cardiomediastinal silhouette is within normal limits.  Nonobstructive bowel gas pattern.  No evidence of free air under the diaphragm on the upright view.  Cholecystectomy clips.  Degenerative changes of the visualized thoracolumbar spine.  Prior posterior surgical fixation at L5 and S1.  Right hip arthroplasty.  IMPRESSION: No evidence of acute  cardiopulmonary disease.  No evidence of small bowel obstruction or free air.  Postsurgical changes, as described above.  Original Report Authenticated By: Charline Bills, M.D.    Medications:  Prior to Admission:  Prescriptions prior to admission  Medication Sig Dispense Refill  . clobetasol (TEMOVATE) 0.05 % cream Apply topically 2 (two) times daily.        . cyclobenzaprine (FLEXERIL) 10 MG tablet Take 10 mg by mouth 2 (two) times daily as needed. For muscle spasms      . diltiazem (CARDIZEM) 120 MG tablet Take 120 mg by mouth 2 (two) times daily.       . furosemide (LASIX) 40 MG tablet Take 40 mg by mouth daily.        Marland Kitchen gabapentin (NEURONTIN) 600 MG tablet Take 600 mg by mouth 3 (three) times daily.        Marland Kitchen HYDROcodone-acetaminophen (NORCO) 5-325 MG per tablet Take 1 tablet by mouth 4 (four) times daily as needed. For pain      . hydrOXYzine (ATARAX) 50 MG tablet Take 50 mg by mouth 3 (three) times daily as needed. For itching      . lansoprazole (PREVACID) 30 MG capsule Take 30 mg by mouth daily.        Marland Kitchen levothyroxine (SYNTHROID, LEVOTHROID) 50 MCG tablet Take 50 mcg by mouth daily.        Marland Kitchen loperamide (IMODIUM) 2 MG capsule Take 1 capsule (2 mg total) by mouth as needed for diarrhea/loose stools.  30 capsule  5  . LORazepam (ATIVAN) 1 MG tablet Take 1 mg by mouth at bedtime. For sleep      . metFORMIN (GLUCOPHAGE) 500 MG tablet Take 500 mg by mouth 2 (two) times daily with a meal.       . metoprolol (LOPRESSOR) 50 MG tablet Take 50 mg by mouth 2 (two) times daily.       . ondansetron (ZOFRAN) 4 MG tablet Take 4 mg by mouth every 8 (eight) hours as needed. For nausea       . ondansetron (ZOFRAN-ODT) 4 MG disintegrating tablet Take 4 mg by mouth every 8 (eight) hours as needed. For nausea       . potassium chloride SA (K-DUR,KLOR-CON) 20 MEQ tablet Take 2 tablets (40 mEq total) by mouth 2 (two) times daily.  120 tablet  12  . dicyclomine (BENTYL) 10 MG capsule Take 10-20 mg by mouth 4  (four) times daily -  before meals and at bedtime. 1-2 capsules before each meal      . estradiol (ESTRACE) 0.5 MG tablet Take 0.5 mg by mouth daily.        Marland Kitchen lisinopril (PRINIVIL,ZESTRIL) 10 MG tablet Take 10 mg by mouth daily.        . mupirocin (BACTROBAN) 2 % ointment Apply topically 2 (two) times daily.  60  g  5   Scheduled:   . sodium chloride   Intravenous Once  . clobetasol   Topical BID  . dicyclomine  10-20 mg Oral TID AC & HS  . diltiazem      . diltiazem  120 mg Oral BID  . enoxaparin  40 mg Subcutaneous Q24H  . gabapentin      . gabapentin  600 mg Oral TID  . levothyroxine  50 mcg Oral Q0600  . lisinopril  10 mg Oral Daily  . loperamide  4 mg Oral Once  . LORazepam  1 mg Oral QHS  . metoCLOPramide  10 mg Intravenous Once  . metoprolol  50 mg Oral BID  . mupirocin   Topical BID  . pantoprazole  40 mg Oral BID AC  . potassium chloride SA  40 mEq Oral BID  . sodium chloride  1,000 mL Intravenous Once  . sodium chloride      . DISCONTD: clobetasol   Topical BID  . DISCONTD: diltiazem  120 mg Oral BID  . DISCONTD: gabapentin  600 mg Oral TID  . DISCONTD: metoCLOPramide  3 mg Intravenous Once   Continuous:   . sodium chloride 125 mL/hr at 10/31/10 0600   JXB:[id number], acetaminophen, cyclobenzaprine, HYDROcodone-acetaminophen, hydrOXYzine, loperamide, ondansetron (ZOFRAN) IV, ondansetron, zolpidem  Assesment: She has recurrent nausea vomiting diarrhea and abdominal pain. She does have peptic ulcer disease and it is possible that that is the problem. She has more anemia. Her renal function seems a little bit better and I think she's a little bit less dehydrated. Active Problems:  HIGH BLOOD PRESSURE  MRSA infection, recurrent  Hypothyroidism  Fibromyalgia  Dehydration  Hypertension  Diabetes mellitus type 2 in obese  Anemia, iron deficiency  Peptic ulcer disease    Plan: She is going to have an EGD today and she's also on received some iron. I will  continue with all of the other treatments.    LOS: 1 day   Rigby Swamy L 10/31/2010, 8:51 AM

## 2010-10-31 NOTE — Progress Notes (Signed)
UR Chart Review Completed  

## 2010-11-01 LAB — COMPREHENSIVE METABOLIC PANEL
Albumin: 2.8 g/dL — ABNORMAL LOW (ref 3.5–5.2)
BUN: 10 mg/dL (ref 6–23)
Creatinine, Ser: 0.62 mg/dL (ref 0.50–1.10)
Total Protein: 5.4 g/dL — ABNORMAL LOW (ref 6.0–8.3)

## 2010-11-01 LAB — DIFFERENTIAL
Basophils Absolute: 0 10*3/uL (ref 0.0–0.1)
Basophils Relative: 0 % (ref 0–1)
Eosinophils Absolute: 0.1 10*3/uL (ref 0.0–0.7)
Monocytes Absolute: 1 10*3/uL (ref 0.1–1.0)
Monocytes Relative: 11 % (ref 3–12)
Neutrophils Relative %: 69 % (ref 43–77)

## 2010-11-01 LAB — ABO/RH: ABO/RH(D): A POS

## 2010-11-01 LAB — CORTISOL-AM, BLOOD: Cortisol - AM: 15.4 ug/dL (ref 4.3–22.4)

## 2010-11-01 LAB — URINALYSIS, ROUTINE W REFLEX MICROSCOPIC
Bilirubin Urine: NEGATIVE
Glucose, UA: NEGATIVE mg/dL
Ketones, ur: NEGATIVE mg/dL
Nitrite: NEGATIVE
pH: 7 (ref 5.0–8.0)

## 2010-11-01 LAB — CBC
HCT: 22.1 % — ABNORMAL LOW (ref 36.0–46.0)
Hemoglobin: 6.9 g/dL — CL (ref 12.0–15.0)
MCH: 22.5 pg — ABNORMAL LOW (ref 26.0–34.0)
MCHC: 31.2 g/dL (ref 30.0–36.0)
RDW: 18.4 % — ABNORMAL HIGH (ref 11.5–15.5)

## 2010-11-01 MED ORDER — CIPROFLOXACIN HCL 250 MG PO TABS
500.0000 mg | ORAL_TABLET | Freq: Two times a day (BID) | ORAL | Status: DC
  Administered 2010-11-01 – 2010-11-03 (×4): 500 mg via ORAL
  Filled 2010-11-01 (×4): qty 2

## 2010-11-01 MED ORDER — POTASSIUM CHLORIDE CRYS ER 20 MEQ PO TBCR
40.0000 meq | EXTENDED_RELEASE_TABLET | Freq: Two times a day (BID) | ORAL | Status: AC
  Administered 2010-11-01 (×2): 40 meq via ORAL
  Filled 2010-11-01 (×2): qty 2

## 2010-11-01 MED ORDER — SODIUM CHLORIDE 0.9 % IV SOLN
Freq: Once | INTRAVENOUS | Status: AC
  Administered 2010-11-01: 10 mL via INTRAVENOUS

## 2010-11-01 MED ORDER — SODIUM CHLORIDE 0.9 % IJ SOLN
INTRAMUSCULAR | Status: AC
  Administered 2010-11-01: 10 mL
  Filled 2010-11-01: qty 10

## 2010-11-01 NOTE — Progress Notes (Signed)
NAMELOREL, LEMBO               ACCOUNT NO.:  [id number]  MEDICAL RECORD NO.:  [id number]  LOCATION:  A324                          FACILITY:  [facility]  PHYSICIAN:  Mazzy Santarelli D. Felecia Shelling, MD   DATE OF BIRTH:  1945/07/03  DATE OF PROCEDURE:  11/01/2010 DATE OF DISCHARGE:                                PROGRESS NOTE   SUBJECTIVE:  The patient feels better.  Dark tarry stool has subsided. She has some abdominal discomfort.  No nausea or vomiting.  OBJECTIVE:  GENERAL:  The patient is alert, awake, and resting. VITAL SIGNS:  Blood pressure 117/94, pulse 72, respiratory rate 16, temperature 97.5 degrees Fahrenheit. CHEST:  Clear lung fields.  Good air entry. CARDIOVASCULAR SYSTEM:  First and second heart sounds heard.  No murmur. No gallop. ABDOMEN:  Soft and lax.  Bowel sounds are positive.  No masses or organomegaly. EXTREMITIES:  No leg edema.  LABORATORY DATA:  CBC:  WBC 9.3, hemoglobin 6.9, hematocrit 22.1. Sodium 127, potassium 3.1, chloride 96, carbon dioxide 18, glucose 90, BUN 10, creatinine 0.61, and calcium 7.9.  ASSESSMENT: 1. Gastrointestinal bleeds secondary to peptic ulcer disease. 2. Anemia secondary to the above. 3. Hypokalemia.  PLAN:  We will type crossmatch and transfuse the patient.  We will replace her potassium and continue current treatment.     Janyth Riera D. Felecia Shelling, MD     TDF/MEDQ  D:  11/01/2010  T:  11/01/2010  Job:  [id number]

## 2010-11-01 NOTE — Progress Notes (Signed)
Subjective; Patient complains of dysuria and increased frequency believes she has developed UTI. He denies fever or chills. Her nausea has resolved and she is keeping liquids down. He still has mild epigastric pain. No melena or diarrhea reported today. Objective; Blood pressure 165/90, pulse 65, temperature 97.5 F (36.4 C), temperature source Oral, resp. rate 16, height 5\' 4"  (1.626 m), weight 154 lb 8.7 oz (70.1 kg), SpO2 97.00%. She appears pale; she is in no distress the Her abdomen is soft with mild mid epigastric tenderness.. Lab data WBC 9.3, hemoglobin 6.9, hematocrit 42.1, MCV 72.2, platelet count 703, Patient is receiving PRBCs. Assessment;  1. nausea and vomiting resolved; secondary to peptic ulcer disease.  2.  anemia secondary to GI bleed from duodenal ulcer and on and on and deficiency. Status post endoscopic therapy to duodenal ulcer with stigmata of bleeding yesterday. 3. new problem appears to be urinary tract infection. Recommendation; UA and culture And advance diet to 1800-calorie modified carb diet in a.m. Repeat H&H in a.m.Marland Kitchen

## 2010-11-01 NOTE — Progress Notes (Signed)
Dr Janna Arch informed @ [phone] today of patient's hgb of 6.9. No new orders given other than to make make sure Dr Karilyn Cota is aware of results. I have paged Dr  Darrick Penna who is on call for Dr Karilyn Cota, awaiting return call @ this time.

## 2010-11-02 LAB — GLUCOSE, CAPILLARY

## 2010-11-02 LAB — CBC
HCT: 27.2 % — ABNORMAL LOW (ref 36.0–46.0)
Hemoglobin: 9 g/dL — ABNORMAL LOW (ref 12.0–15.0)
MCH: 25.3 pg — ABNORMAL LOW (ref 26.0–34.0)
MCHC: 33.1 g/dL (ref 30.0–36.0)

## 2010-11-02 LAB — TYPE AND SCREEN
ABO/RH(D): A POS
Antibody Screen: NEGATIVE
Unit division: 0

## 2010-11-02 NOTE — Progress Notes (Signed)
Subjective; Patient days this is the best day she has had in last 2 weeks; her appetite is coming back and she is not sick any more. She is passing flattest but no more stools. She is ambulating in the hall and feels her strength is coming back. Objective; BP 139/77  Pulse 70  Temp(Src) 98.6 F (37 C) (Oral)  Resp 16  Ht 5\' 4"  (1.626 m)  Wt 154 lb 8.7 oz (70.1 kg)  BMI 26.53 kg/m2  SpO2 95% She does not appear pale anymore. Abdominal exam reveals soft and nontender abdomen. Lab data; WBC 14.8, hemoglobin 9, hematocrit 27.6, Fasting cortisol level normal at 15.4. Gastrin level pending. UA abnormal with too numerous to count WBC Culture pending. Assessment; 1. Nausea/ vomiting secondary to peptic ulcer disease; improved with therapy. 2. Anemia; acute on chronic secondary to upper GI bleed from  duodenal ulcer; status post endoscopic therapy; has received parenteral iron as well as 2 units of PRBCs. 3. UTI; as well as Cipro while cultures are pending

## 2010-11-03 LAB — URINE CULTURE
Colony Count: >=100000
Culture  Setup Time: 201208040215

## 2010-11-03 LAB — CBC
HCT: 28.9 % — ABNORMAL LOW (ref 36.0–46.0)
MCV: 76.5 fL — ABNORMAL LOW (ref 78.0–100.0)
Platelets: 331 10*3/uL (ref 150–400)
RBC: 3.78 MIL/uL — ABNORMAL LOW (ref 3.87–5.11)
WBC: 11.3 10*3/uL — ABNORMAL HIGH (ref 4.0–10.5)

## 2010-11-03 LAB — BASIC METABOLIC PANEL
BUN: 3 mg/dL — ABNORMAL LOW (ref 6–23)
CO2: 20 mEq/L (ref 19–32)
Chloride: 95 mEq/L — ABNORMAL LOW (ref 96–112)
Creatinine, Ser: 0.48 mg/dL — ABNORMAL LOW (ref 0.50–1.10)
Potassium: 2.9 mEq/L — ABNORMAL LOW (ref 3.5–5.1)

## 2010-11-03 MED ORDER — DILTIAZEM HCL ER COATED BEADS 120 MG PO CP24: 120.0000 mg | ORAL_CAPSULE | Freq: Two times a day (BID) | ORAL | Status: DC

## 2010-11-03 MED ORDER — POTASSIUM CHLORIDE 10 MEQ/100ML IV SOLN
10.0000 meq | INTRAVENOUS | Status: AC
  Administered 2010-11-03 (×2): 10 meq via INTRAVENOUS
  Filled 2010-11-03 (×2): qty 100

## 2010-11-03 NOTE — Plan of Care (Signed)
Problem: Discharge Progression Outcomes Goal: Discharge plan in place and appropriate Outcome: Completed/Met Date Met:  11/03/10 Pt discharged home in stable condition.  Pt instructed to seek medical attn is s/s worsen or reoccur, to make appt with PCP in 2 weeks to follow up, and given care notes on anemia.  Pt verbalizes understanding of these instructions

## 2010-11-03 NOTE — Discharge Summary (Signed)
NAMEAUTRY, DROEGE               ACCOUNT NO.:  [id number]  MEDICAL RECORD NO.:  [id number]  LOCATION:  A324                          FACILITY:  [facility]  PHYSICIAN:  Ellysa Parrack D. Felecia Shelling, MD   DATE OF BIRTH:  12/20/1945  DATE OF ADMISSION:  10/30/2010 DATE OF DISCHARGE:  08/05/2012LH                              DISCHARGE SUMMARY   DISCHARGE DIAGNOSES: 1. Recurrent gastrointestinal bleed secondary to peptic ulcer disease. 2. Anemia secondary to recurrent gastrointestinal bleed. 3. Diabetes mellitus. 4. Hypertension. 5. Hypothyroidism. 6. Fibromyalgia. 7. Chronic back pain. 8. Urinary tract infection.  DISCHARGE MEDICATIONS: 1. Ativan 1 mg p.o. nightly. 2. Flexeril 10 mg 2 tablets p.o. daily as needed. 3. Bentyl 10-20 mg 4 times a day with meals. 4. Diltiazem 120 mg 2 tablets daily. 5. Estradiol 0.5 mg daily. 6. Furosemide 40 mg daily. 7. Gabapentin 600 mg 3 times daily. 8. Norco 5/325 mg daily p.r.n. 9. Atarax 50 mg t.i.d. as needed pain. 10.Prevacid 30 mg daily. 11.Levothyroxine 50 mcg daily. 12.Lisinopril 10 mg daily. 13.Loperamide 2 mg as needed for diarrhea. 14.Metformin 500 mg 2 tablets daily. 15.Metoprolol 50 mg 2 tablets daily. 16.Potassium 20 mEq 2 tablets 2 times daily. 17.Zofran 4 mg daily for nausea and vomiting as needed.  DISPOSITION:  The patient will be discharged to home in stable condition.  DISCHARGE INSTRUCTIONS:  The patient will be followed with GI and with Dr. Juanetta Gosling, her primary care physician.  LABORATORY DATA ON DISCHARGE:  CBC:  WBC 11.3, hemoglobin 9.7, hematocrit 28.9, and platelets 331.  HOSPITAL COURSE:  This is a 65 year old female patient with history of multiple medical illnesses who was readmitted due to nausea and diarrhea.  The patient was found to have dark tarry stool.  She has a history of peptic ulcer disease.  She was found to have a bleeding ulcer.  The patient was started on medical treatment.  A GI consult was done and the  patient was evaluated by Dr. Karilyn Cota.  Her hemoglobin and hematocrit dropped and she was transfused 2 units of packed red blood cells.  Her bleeding has stopped and her hemoglobin currently is stable around 9.7.  The patient is going to be discharged home to continue her regular medications.     Valerio Pinard D. Felecia Shelling, MD     TDF/MEDQ  D:  11/03/2010  T:  11/03/2010  Job:  [id number]

## 2010-11-03 NOTE — Progress Notes (Signed)
Subjective: Patient anxious to home; husband states that several meals yesterday and had no difficulty; she has passing a lot of flatus but no melena diarrhea or vomiting; also denies heartburn or odynophagia. Objective;  Blood pressure 172/82, pulse 72, temperature 98.3 F (36.8 C), temperature source Oral, resp. rate 16, height 5\' 4"  (1.626 m), weight 154 lb 8.7 oz (70.1 kg), SpO2 97.00%. Abdominal exam is normal. Lab data; WBC 11.3, hemoglobin 9.7, hematocrit 28.9, platelet count 3:30 1K. Serum sodium 129, potassium 2.9, UN 3, creatinine 0.8,   Impression: 1. nausea vomiting and abdominal pain has resolved; secondary to tic ulcer disease. 2.  GI bleed that can reach a duodenal ulcer; no  evidence of a recurrent bleed H&H is stable. 3.  hypokalemia being addressed by Dr. Felecia Shelling I have asked that she go home on enteric coated KCL. She is getting 20 meq  of IV potassium for she goes home. 4. cystitis symptoms have resolved.    Plan: Will plan to see patient in the office in 2 months unless she has a lapse of her symptoms.     LOS: 4 days   REHMAN,NAJEEB U 11/03/2010, 12:34 PM

## 2010-11-04 ENCOUNTER — Telehealth: Payer: Self-pay | Admitting: Internal Medicine

## 2010-11-04 DIAGNOSIS — N390 Urinary tract infection, site not specified: Secondary | ICD-10-CM

## 2010-11-04 MED ORDER — NITROFURANTOIN MACROCRYSTAL 100 MG PO CAPS: 100.0000 mg | ORAL_CAPSULE | Freq: Two times a day (BID) | ORAL | Status: AC

## 2010-11-04 NOTE — Progress Notes (Signed)
Rebecca Hartman, Rebecca Hartman               ACCOUNT NO.:  [id number]  MEDICAL RECORD NO.:  [id number]  LOCATION:                                 FACILITY:  PHYSICIAN:  Aryan Bello D. Felecia Shelling, MD   DATE OF BIRTH:  March 17, 1946  DATE OF PROCEDURE: DATE OF DISCHARGE:                                PROGRESS NOTE   SUBJECTIVE:  The patient feels much better.  She was transfused 3 units of packed red blood cells yesterday.  No nausea, vomiting, or abdominal pain.  OBJECTIVE:  GENERAL:  The patient is comfortable, alert, and awake. VITAL SIGNS:  Blood pressure 128/75, pulse 85, respiratory rate 16, temperature 97.5 degrees Fahrenheit. CHEST:  Clear lung fields.  Good air entry. CARDIOVASCULAR SYSTEM:  First and second heart sounds heard.  No murmur. No gallop. ABDOMEN:  Soft and lax.  Bowel sounds are positive.  No masses or organomegaly. EXTREMITIES:  No leg edema.  CBC:  WBC 14.8, hemoglobin 9.0, hematocrit 27.2, platelet 218.  ASSESSMENT: 1. Gastrointestinal bleed secondary to peptic ulcer disease. 2. Anemia and status post transfusion. 3. Hypokalemia, clinically improved.  PLAN:  We will continue current treatment.  We will repeat CBC in a.m.Marland Kitchen If the patient remained stable and if GI agrees, we will plan to discharge the patient home tomorrow.     Kathrene Sinopoli D. Felecia Shelling, MD     TDF/MEDQ  D:  11/02/2010  T:  11/02/2010  Job:  [id number]

## 2010-11-04 NOTE — Telephone Encounter (Signed)
Urine culture report noted she has Klebsiella resistant to Cipro will start on Macrodantin 100 mg twice daily for one week results are reviewed with the patient over the phone

## 2010-11-05 LAB — GASTRIN: Gastrin: 165 pg/mL — ABNORMAL HIGH (ref <101)

## 2010-11-07 ENCOUNTER — Telehealth (INDEPENDENT_AMBULATORY_CARE_PROVIDER_SITE_OTHER): Payer: Self-pay | Admitting: *Deleted

## 2010-11-07 ENCOUNTER — Other Ambulatory Visit (INDEPENDENT_AMBULATORY_CARE_PROVIDER_SITE_OTHER): Payer: Self-pay | Admitting: *Deleted

## 2010-11-07 DIAGNOSIS — E164 Increased secretion of gastrin: Secondary | ICD-10-CM

## 2010-11-07 NOTE — Telephone Encounter (Signed)
Patient to have repeat Gastrin in November 2011.

## 2010-11-08 ENCOUNTER — Encounter (INDEPENDENT_AMBULATORY_CARE_PROVIDER_SITE_OTHER): Payer: Self-pay

## 2010-11-08 ENCOUNTER — Encounter (HOSPITAL_COMMUNITY): Payer: Self-pay | Admitting: Internal Medicine

## 2010-11-14 ENCOUNTER — Ambulatory Visit (INDEPENDENT_AMBULATORY_CARE_PROVIDER_SITE_OTHER): Payer: Medicare Other | Admitting: Internal Medicine

## 2010-11-26 ENCOUNTER — Inpatient Hospital Stay (HOSPITAL_COMMUNITY)
Admission: EM | Admit: 2010-11-26 | Discharge: 2010-11-29 | DRG: 384 | Disposition: A | Discharge status: Home / Self Care | Payer: Medicare Other | Attending: Pulmonary Disease | Admitting: Pulmonary Disease

## 2010-11-26 ENCOUNTER — Encounter (HOSPITAL_COMMUNITY): Payer: Self-pay

## 2010-11-26 DIAGNOSIS — IMO0001 Reserved for inherently not codable concepts without codable children: Secondary | ICD-10-CM | POA: Diagnosis present

## 2010-11-26 DIAGNOSIS — E860 Dehydration: Secondary | ICD-10-CM | POA: Diagnosis present

## 2010-11-26 DIAGNOSIS — Z8614 Personal history of Methicillin resistant Staphylococcus aureus infection: Secondary | ICD-10-CM

## 2010-11-26 DIAGNOSIS — Z8679 Personal history of other diseases of the circulatory system: Secondary | ICD-10-CM | POA: Diagnosis present

## 2010-11-26 DIAGNOSIS — I10 Essential (primary) hypertension: Secondary | ICD-10-CM | POA: Diagnosis present

## 2010-11-26 DIAGNOSIS — E876 Hypokalemia: Secondary | ICD-10-CM | POA: Diagnosis present

## 2010-11-26 DIAGNOSIS — Z22322 Carrier or suspected carrier of Methicillin resistant Staphylococcus aureus: Secondary | ICD-10-CM

## 2010-11-26 DIAGNOSIS — E1165 Type 2 diabetes mellitus with hyperglycemia: Secondary | ICD-10-CM | POA: Diagnosis present

## 2010-11-26 DIAGNOSIS — A4902 Methicillin resistant Staphylococcus aureus infection, unspecified site: Secondary | ICD-10-CM | POA: Diagnosis not present

## 2010-11-26 DIAGNOSIS — K5289 Other specified noninfective gastroenteritis and colitis: Secondary | ICD-10-CM

## 2010-11-26 DIAGNOSIS — IMO0002 Reserved for concepts with insufficient information to code with codable children: Secondary | ICD-10-CM | POA: Diagnosis present

## 2010-11-26 DIAGNOSIS — E119 Type 2 diabetes mellitus without complications: Secondary | ICD-10-CM | POA: Diagnosis present

## 2010-11-26 DIAGNOSIS — M797 Fibromyalgia: Secondary | ICD-10-CM | POA: Diagnosis present

## 2010-11-26 DIAGNOSIS — R109 Unspecified abdominal pain: Secondary | ICD-10-CM | POA: Diagnosis present

## 2010-11-26 DIAGNOSIS — E039 Hypothyroidism, unspecified: Secondary | ICD-10-CM | POA: Diagnosis present

## 2010-11-26 DIAGNOSIS — K279 Peptic ulcer, site unspecified, unspecified as acute or chronic, without hemorrhage or perforation: Secondary | ICD-10-CM | POA: Diagnosis present

## 2010-11-26 LAB — CBC
HCT: 39.9 % (ref 36.0–46.0)
HCT: 35.5 % — ABNORMAL LOW (ref 36.0–46.0)
Hemoglobin: 13.5 g/dL (ref 12.0–15.0)
Hemoglobin: 12.2 g/dL (ref 12.0–15.0)
MCH: 27.2 pg (ref 26.0–34.0)
MCHC: 34.4 g/dL (ref 30.0–36.0)
MCV: 79 fL (ref 78.0–100.0)
RDW: 18.5 % — ABNORMAL HIGH (ref 11.5–15.5)
RDW: 18.4 % — ABNORMAL HIGH (ref 11.5–15.5)
WBC: 7.2 10*3/uL (ref 4.0–10.5)

## 2010-11-26 LAB — COMPREHENSIVE METABOLIC PANEL
AST: 19 U/L (ref 0–37)
Albumin: 3.4 g/dL — ABNORMAL LOW (ref 3.5–5.2)
BUN: 6 mg/dL (ref 6–23)
BUN: 6 mg/dL (ref 6–23)
CO2: 21 mEq/L (ref 19–32)
Calcium: 9.2 mg/dL (ref 8.4–10.5)
Calcium: 8.2 mg/dL — ABNORMAL LOW (ref 8.4–10.5)
Chloride: 94 mEq/L — ABNORMAL LOW (ref 96–112)
Creatinine, Ser: 0.6 mg/dL (ref 0.50–1.10)
Creatinine, Ser: 0.5 mg/dL (ref 0.50–1.10)
GFR calc Af Amer: >60 mL/min (ref >60)
GFR calc Af Amer: >60 mL/min (ref >60)
GFR calc non Af Amer: >60 mL/min (ref >60)
Glucose, Bld: 130 mg/dL — ABNORMAL HIGH (ref 70–99)
Glucose, Bld: 114 mg/dL — ABNORMAL HIGH (ref 70–99)
Potassium: 3.4 mEq/L — ABNORMAL LOW (ref 3.5–5.1)
Total Bilirubin: 0.4 mg/dL (ref 0.3–1.2)
Total Protein: 6 g/dL (ref 6.0–8.3)

## 2010-11-26 LAB — DIFFERENTIAL
Basophils Absolute: 0 10*3/uL (ref 0.0–0.1)
Eosinophils Relative: 0 % (ref 0–5)
Lymphocytes Relative: 16 % (ref 12–46)
Lymphocytes Relative: 16 % (ref 12–46)
Lymphs Abs: 1.2 10*3/uL (ref 0.7–4.0)
Monocytes Absolute: 0.7 10*3/uL (ref 0.1–1.0)
Monocytes Relative: 10 % (ref 3–12)
Monocytes Relative: 9 % (ref 3–12)
Neutro Abs: 5.6 10*3/uL (ref 1.7–7.7)
Neutrophils Relative %: 75 % (ref 43–77)

## 2010-11-26 LAB — URINALYSIS, ROUTINE W REFLEX MICROSCOPIC
Nitrite: NEGATIVE
Protein, ur: 30 mg/dL — AB
Specific Gravity, Urine: 1.02 (ref 1.005–1.030)
Urobilinogen, UA: 0.2 mg/dL (ref 0.0–1.0)

## 2010-11-26 LAB — URINE MICROSCOPIC-ADD ON

## 2010-11-26 LAB — GLUCOSE, CAPILLARY: Glucose-Capillary: 113 mg/dL — ABNORMAL HIGH (ref 70–99)

## 2010-11-26 MED ORDER — POTASSIUM CHLORIDE CRYS ER 20 MEQ PO TBCR
40.0000 meq | EXTENDED_RELEASE_TABLET | Freq: Two times a day (BID) | ORAL | Status: DC
  Administered 2010-11-26 – 2010-11-28 (×5): 40 meq via ORAL
  Filled 2010-11-26: qty 1
  Filled 2010-11-26 (×3): qty 2
  Filled 2010-11-26 (×2): qty 1
  Filled 2010-11-26: qty 2

## 2010-11-26 MED ORDER — ZOLPIDEM TARTRATE 5 MG PO TABS
5.0000 mg | ORAL_TABLET | Freq: Every evening | ORAL | Status: DC | PRN
  Administered 2010-11-26 – 2010-11-28 (×3): 5 mg via ORAL
  Filled 2010-11-26 (×3): qty 1

## 2010-11-26 MED ORDER — LEVOTHYROXINE SODIUM 50 MCG PO TABS
50.0000 ug | ORAL_TABLET | Freq: Every day | ORAL | Status: DC
  Administered 2010-11-26 – 2010-11-28 (×3): 50 ug via ORAL
  Filled 2010-11-26 (×3): qty 1

## 2010-11-26 MED ORDER — LORAZEPAM 1 MG PO TABS
1.0000 mg | ORAL_TABLET | Freq: Every day | ORAL | Status: DC
  Administered 2010-11-26 – 2010-11-28 (×3): 1 mg via ORAL
  Filled 2010-11-26 (×3): qty 1

## 2010-11-26 MED ORDER — SODIUM CHLORIDE 0.9 % IV SOLN
INTRAVENOUS | Status: DC
  Administered 2010-11-26 – 2010-11-29 (×4): via INTRAVENOUS

## 2010-11-26 MED ORDER — ONDANSETRON HCL 4 MG PO TABS: 4.0000 mg | ORAL_TABLET | Freq: Four times a day (QID) | ORAL | Status: DC | PRN

## 2010-11-26 MED ORDER — METOPROLOL TARTRATE 50 MG PO TABS
50.0000 mg | ORAL_TABLET | Freq: Two times a day (BID) | ORAL | Status: DC
  Administered 2010-11-26 – 2010-11-28 (×6): 50 mg via ORAL
  Filled 2010-11-26 (×6): qty 1

## 2010-11-26 MED ORDER — CLOBETASOL PROPIONATE 0.05 % EX OINT
TOPICAL_OINTMENT | Freq: Two times a day (BID) | CUTANEOUS | Status: DC
  Administered 2010-11-26 – 2010-11-28 (×5): via TOPICAL
  Filled 2010-11-26: qty 15

## 2010-11-26 MED ORDER — MUPIROCIN 2 % EX OINT
TOPICAL_OINTMENT | Freq: Two times a day (BID) | CUTANEOUS | Status: DC
  Administered 2010-11-26 – 2010-11-28 (×5): via TOPICAL
  Filled 2010-11-26 (×3): qty 22

## 2010-11-26 MED ORDER — SODIUM CHLORIDE 0.9 % IV BOLUS (SEPSIS)
1000.0000 mL | Freq: Once | INTRAVENOUS | Status: AC
  Administered 2010-11-26: 1000 mL via INTRAVENOUS

## 2010-11-26 MED ORDER — ESTRADIOL 1 MG PO TABS
0.5000 mg | ORAL_TABLET | Freq: Every day | ORAL | Status: DC
  Administered 2010-11-26: 18:00:00 via ORAL
  Administered 2010-11-27 – 2010-11-28 (×2): 0.5 mg via ORAL
  Filled 2010-11-26 (×4): qty 1

## 2010-11-26 MED ORDER — PANTOPRAZOLE SODIUM 40 MG IV SOLR
40.0000 mg | Freq: Two times a day (BID) | INTRAVENOUS | Status: DC
  Administered 2010-11-26 – 2010-11-28 (×5): 40 mg via INTRAVENOUS
  Filled 2010-11-26 (×6): qty 40

## 2010-11-26 MED ORDER — INSULIN ASPART 100 UNIT/ML ~~LOC~~ SOLN: 0.0000 [IU] | Freq: Every day | SUBCUTANEOUS | Status: DC

## 2010-11-26 MED ORDER — HYDROXYZINE PAMOATE 25 MG PO CAPS
50.0000 mg | ORAL_CAPSULE | Freq: Four times a day (QID) | ORAL | Status: DC | PRN
  Filled 2010-11-26: qty 2

## 2010-11-26 MED ORDER — PROMETHAZINE HCL 25 MG/ML IJ SOLN: 12.5000 mg | Freq: Four times a day (QID) | INTRAMUSCULAR | Status: DC | PRN

## 2010-11-26 MED ORDER — ENOXAPARIN SODIUM 80 MG/0.8ML ~~LOC~~ SOLN
40.0000 mg | SUBCUTANEOUS | Status: DC
  Administered 2010-11-26 – 2010-11-28 (×3): 40 mg via SUBCUTANEOUS
  Filled 2010-11-26 (×3): qty 0.8

## 2010-11-26 MED ORDER — ACETAMINOPHEN 325 MG PO TABS
650.0000 mg | ORAL_TABLET | Freq: Four times a day (QID) | ORAL | Status: DC | PRN
  Administered 2010-11-26 – 2010-11-28 (×2): 650 mg via ORAL
  Filled 2010-11-26 (×2): qty 2

## 2010-11-26 MED ORDER — HYDROXYZINE HCL 25 MG PO TABS: 50.0000 mg | ORAL_TABLET | Freq: Four times a day (QID) | ORAL | Status: DC | PRN

## 2010-11-26 MED ORDER — HYDROCODONE-ACETAMINOPHEN 5-325 MG PO TABS
1.0000 | ORAL_TABLET | ORAL | Status: DC | PRN
  Administered 2010-11-27: 2 via ORAL
  Administered 2010-11-27: 1 via ORAL
  Administered 2010-11-27 – 2010-11-28 (×3): 2 via ORAL
  Administered 2010-11-28: 1 via ORAL
  Administered 2010-11-29: 2 via ORAL
  Filled 2010-11-26 (×2): qty 2
  Filled 2010-11-26: qty 1
  Filled 2010-11-26 (×3): qty 2

## 2010-11-26 MED ORDER — ACETAMINOPHEN 650 MG RE SUPP: 650.0000 mg | Freq: Four times a day (QID) | RECTAL | Status: DC | PRN

## 2010-11-26 MED ORDER — ONDANSETRON HCL 4 MG/2ML IJ SOLN
4.0000 mg | Freq: Once | INTRAMUSCULAR | Status: AC
  Administered 2010-11-26: 4 mg via INTRAVENOUS
  Filled 2010-11-26: qty 2

## 2010-11-26 MED ORDER — INSULIN ASPART 100 UNIT/ML ~~LOC~~ SOLN: 0.0000 [IU] | Freq: Three times a day (TID) | SUBCUTANEOUS | Status: DC

## 2010-11-26 MED ORDER — DILTIAZEM HCL ER COATED BEADS 120 MG PO CP24
120.0000 mg | ORAL_CAPSULE | Freq: Every day | ORAL | Status: DC
  Administered 2010-11-26 – 2010-11-28 (×3): 120 mg via ORAL
  Filled 2010-11-26 (×3): qty 1

## 2010-11-26 MED ORDER — HYDROMORPHONE HCL 1 MG/ML IJ SOLN: 0.5000 mg | INTRAMUSCULAR | Status: DC | PRN

## 2010-11-26 MED ORDER — DICYCLOMINE HCL 10 MG PO CAPS
10.0000 mg | ORAL_CAPSULE | Freq: Three times a day (TID) | ORAL | Status: DC
  Administered 2010-11-26 (×2): 10 mg via ORAL
  Administered 2010-11-27 (×2): 20 mg via ORAL
  Filled 2010-11-26: qty 1
  Filled 2010-11-26: qty 2
  Filled 2010-11-26: qty 1
  Filled 2010-11-26: qty 2

## 2010-11-26 MED ORDER — ONDANSETRON HCL 4 MG/2ML IJ SOLN
4.0000 mg | Freq: Four times a day (QID) | INTRAMUSCULAR | Status: DC | PRN
  Administered 2010-11-26: 4 mg via INTRAVENOUS
  Filled 2010-11-26: qty 2

## 2010-11-26 NOTE — ED Notes (Signed)
Pt had moderate size semi formed stool with mucous. Light Rebecca Hartman in color.

## 2010-11-26 NOTE — H&P (Signed)
Rebecca Hartman MRN: [id number] DOB/AGE: 1945-05-12 65 y.o. Primary Care Physician:Chalese Peach L, MD Admit date: 11/26/2010 Chief Complaint: Abdominal discomfort nausea vomiting and diarrhea HPI: This is a 65 year old Caucasian female who has been in and out of the hospital for the last 6 weeks with episodic abdominal discomfort nausea vomiting and diarrhea. She has had multiple evaluations. She recently had an EGD that showed she does have peptic ulcer disease and had coagulation of the ulcer. She went home and did well for a period of about 2 weeks  been started about 48 hours ago having problems again. She has had a great deal of nausea and vomiting she's had significant abdominal pain. She's also complaining now that her ear hurts on the left  Past Medical History  Diagnosis Date  . Hypertension   . Diabetes mellitus   . Hypothyroidism   . Fibromyalgia   . Chronic back pain   . Anemia   . Nausea & vomiting   . Hiatal hernia    Past Surgical History  Procedure Date  . Tonsillectomy   . Cholecystectomy   . Carpal tunnel release   . Back surgery   . Total hip revision   . Abdominal hysterectomy   . Esophagogastroduodenoscopy 10/31/2010    Procedure: ESOPHAGOGASTRODUODENOSCOPY (EGD);  Surgeon: Malissa Hippo, MD;  Location: [facility];  Service: Endoscopy;  Laterality: N/A;  . Colonoscopy 08/06/06 FIELDS  . Upper gastrointestinal endoscopy 10/03/2010    EGD DILATN PYLORIC CHANNEL  . Upper gastrointestinal endoscopy 07/08/2010  . Upper gastrointestinal endoscopy 08/06/06    FIELDS  . Upper gastrointestinal endoscopy 06/29/2008    EGD        History reviewed. No pertinent family history. Not known to be positive for any GI problems  Social History:  reports that she has never smoked. She has never used smokeless tobacco. She reports that she does not drink alcohol or use illicit drugs. She is married and lives at home with her husband   Allergies:  Allergies  Allergen  Reactions  . Shellfish Allergy Itching and Nausea And Vomiting  . Levaquin Itching  . Lisinopril Other (See Comments)    SEVERE DROP IN PRESSURE  . Morphine Itching and Nausea Only  . Pantoprazole Sodium (Protonix) Nausea And Vomiting    PATIENT IS UNSURE OF REACTION    Medications Prior to Admission  Medication Dose Route Frequency Provider Last Rate Last Dose  . 0.9 %  sodium chloride infusion   Intravenous Continuous Fredirick Maudlin 125 mL/hr at 11/26/10 1737    . acetaminophen (TYLENOL) tablet 650 mg  650 mg Oral Q6H PRN Fredirick Maudlin   650 mg at 11/26/10 1734   Or  . acetaminophen (TYLENOL) suppository 650 mg  650 mg Rectal Q6H PRN Fredirick Maudlin      . clobetasol (TEMOVATE) 0.05 % ointment   Topical BID Fredirick Maudlin      . dicyclomine (BENTYL) capsule 10-20 mg  10-20 mg Oral TID AC & HS Timothea Bodenheimer L Obed Samek   10 mg at 11/26/10 1735  . diltiazem (CARDIZEM CD) 24 hr capsule 120 mg  120 mg Oral Daily Norina Cowper L Lorma Heater   120 mg at 11/26/10 1734  . enoxaparin (LOVENOX) injection 40 mg  40 mg Subcutaneous Q24H Nova Schmuhl L Si Jachim   40 mg at 11/26/10 1734  . estradiol (ESTRACE) tablet 0.5 mg  0.5 mg Oral Daily Fredirick Maudlin      . HYDROcodone-acetaminophen (NORCO) 5-325 MG per  tablet 1-2 tablet  1-2 tablet Oral Q4H PRN Fredirick Maudlin      . HYDROmorphone (DILAUDID) injection 0.5 mg  0.5 mg Intravenous Q2H PRN Fredirick Maudlin      . hydrOXYzine (ATARAX) tablet 50 mg  50 mg Oral Q6H PRN Mady Gemma, PHARMD      . levothyroxine (SYNTHROID, LEVOTHROID) tablet 50 mcg  50 mcg Oral Daily Fredirick Maudlin   50 mcg at 11/26/10 1735  . LORazepam (ATIVAN) tablet 1 mg  1 mg Oral QHS Fredirick Maudlin      . metoprolol (LOPRESSOR) tablet 50 mg  50 mg Oral BID Fredirick Maudlin   50 mg at 11/26/10 1735  . mupirocin (BACTROBAN) 2 % ointment   Topical BID Fredirick Maudlin      . ondansetron (ZOFRAN) injection 4 mg  4 mg Intravenous Once Donnetta Hutching, MD   4 mg at 11/26/10 1107  . ondansetron  (ZOFRAN) injection 4 mg  4 mg Intravenous Once Donnetta Hutching, MD   4 mg at 11/26/10 1428  . ondansetron (ZOFRAN) tablet 4 mg  4 mg Oral Q6H PRN Fredirick Maudlin       Or  . ondansetron [facility]) injection 4 mg  4 mg Intravenous Q6H PRN Fredirick Maudlin      . pantoprazole (PROTONIX) injection 40 mg  40 mg Intravenous Q12H Saidee Geremia L Rhyann Berton   40 mg at 11/26/10 1737  . potassium chloride SA (K-DUR,KLOR-CON) CR tablet 40 mEq  40 mEq Oral BID Fredirick Maudlin   40 mEq at 11/26/10 1734  . promethazine (PHENERGAN) injection 12.5 mg  12.5 mg Intravenous Q6H PRN Fredirick Maudlin      . sodium chloride 0.9 % bolus 1,000 mL  1,000 mL Intravenous Once Donnetta Hutching, MD   1,000 mL at 11/26/10 1109  . sodium chloride 0.9 % bolus 1,000 mL  1,000 mL Intravenous Once Donnetta Hutching, MD   1,000 mL at 11/26/10 0945  . zolpidem (AMBIEN) tablet 5 mg  5 mg Oral QHS PRN Fredirick Maudlin      . DISCONTD: hydrOXYzine (VISTARIL) capsule 50 mg  50 mg Oral Q6H PRN Fredirick Maudlin       Medications Prior to Admission  Medication Sig Dispense Refill  . clobetasol (TEMOVATE) 0.05 % cream Apply topically 2 (two) times daily. TO LEGS       . cyclobenzaprine (FLEXERIL) 10 MG tablet Take 10 mg by mouth 2 (two) times daily as needed. For muscle spasms      . dicyclomine (BENTYL) 10 MG capsule Take 10-20 mg by mouth 4 (four) times daily -  before meals and at bedtime. 1-2 capsules before each meal      . diltiazem (CARDIZEM CD) 120 MG 24 hr capsule Take 120 mg by mouth daily.        Marland Kitchen estradiol (ESTRACE) 0.5 MG tablet Take 0.5 mg by mouth daily.        . fluocinonide (LIDEX) 0.05 % external solution Apply 1 application topically Daily. TO LEGS      . furosemide (LASIX) 40 MG tablet Take 40 mg by mouth daily.        Marland Kitchen gabapentin (NEURONTIN) 600 MG tablet Take 600 mg by mouth 3 (three) times daily as needed.       Marland Kitchen HYDROcodone-acetaminophen (NORCO) 5-325 MG per tablet Take 1 tablet by mouth 4 (four) times daily as needed. For pain      .  lansoprazole (PREVACID) 30 MG capsule Take 30 mg by mouth daily.        Marland Kitchen levothyroxine (SYNTHROID, LEVOTHROID) 50 MCG tablet Take 50 mcg by mouth daily.        Marland Kitchen LORazepam (ATIVAN) 1 MG tablet Take 1 mg by mouth at bedtime. For sleep      . metFORMIN (GLUCOPHAGE) 500 MG tablet Take 500 mg by mouth 2 (two) times daily with a meal. TAKE AFTERNOON DOSE IF NEEDED      . metoprolol (LOPRESSOR) 50 MG tablet Take 50 mg by mouth 2 (two) times daily.       . mupirocin (BACTROBAN) 2 % ointment Apply topically 2 (two) times daily.  60 g  5  . ondansetron (ZOFRAN) 4 MG tablet Take 4 mg by mouth every 8 (eight) hours as needed. For nausea       . potassium chloride SA (K-DUR,KLOR-CON) 20 MEQ tablet Take 2 tablets (40 mEq total) by mouth 2 (two) times daily.  120 tablet  12  . diltiazem (CARDIZEM) 120 MG tablet Take 120 mg by mouth 2 (two) times daily.       . hydrOXYzine (ATARAX) 50 MG tablet Take 50 mg by mouth 3 (three) times daily as needed. For itching      . hydrOXYzine (VISTARIL) 50 MG capsule Take 1 tablet by mouth Once daily as needed. FOR ITCHING      . lisinopril (PRINIVIL,ZESTRIL) 10 MG tablet Take 10 mg by mouth daily.             NWG:NFAOZ from the symptoms mentioned above,there are no other symptoms referable to all systems reviewed.  Physical Exam: Blood pressure 171/99, pulse 89, temperature 98.7 F (37.1 C), temperature source Oral, resp. rate 20, height 5\' 4"  (1.626 m), weight 70.761 kg (156 lb), SpO2 99.00%. She looks acutely ill. She is somewhat pale. Her pupils are reactive to light and accommodation. Her mucous membranes are dry. Her neck is supple without masses bruits or JVD. Tympanic membranes are intact. Her chest shows some rhonchi. Her heart is regular without gallop. Her abdomen is diffusely tender but without rebound. Bowel sounds are hyperactive. Her extremities showed no clubbing cyanosis or edema but she does have chronic skin changes. Her central nervous system examination  shows a she's anxious but otherwise okay   Results for orders placed during the hospital encounter of 11/26/10 (from the past 48 hour(s))  CBC     Status: Abnormal   Collection Time   11/26/10  9:39 AM      Component Value Range Comment   WBC 7.2  4.0 - 10.5 (K/uL)    RBC 5.05  3.87 - 5.11 (MIL/uL)    Hemoglobin 13.5  12.0 - 15.0 (g/dL)    HCT 30.8  65.7 - 84.6 (%)    MCV 79.0  78.0 - 100.0 (fL)    MCH 26.7  26.0 - 34.0 (pg)    MCHC 33.8  30.0 - 36.0 (g/dL)    RDW 96.2 (*) 95.2 - 15.5 (%)    Platelets 380  150 - 400 (K/uL)   DIFFERENTIAL     Status: Normal   Collection Time   11/26/10  9:39 AM      Component Value Range Comment   Neutrophils Relative 74  43 - 77 (%)    Neutro Abs 5.3  1.7 - 7.7 (K/uL)    Lymphocytes Relative 16  12 - 46 (%)    Lymphs Abs 1.1  0.7 -  4.0 (K/uL)    Monocytes Relative 10  3 - 12 (%)    Monocytes Absolute 0.7  0.1 - 1.0 (K/uL)    Eosinophils Relative 0  0 - 5 (%)    Eosinophils Absolute 0.0  0.0 - 0.7 (K/uL)    Basophils Relative 0  0 - 1 (%)    Basophils Absolute 0.0  0.0 - 0.1 (K/uL)   COMPREHENSIVE METABOLIC PANEL     Status: Abnormal   Collection Time   11/26/10  9:39 AM      Component Value Range Comment   Sodium 130 (*) 135 - 145 (mEq/L)    Potassium 3.3 (*) 3.5 - 5.1 (mEq/L)    Chloride 94 (*) 96 - 112 (mEq/L)    CO2 21  19 - 32 (mEq/L)    Glucose, Bld 130 (*) 70 - 99 (mg/dL)    BUN 6  6 - 23 (mg/dL)    Creatinine, Ser 1.61  0.50 - 1.10 (mg/dL)    Calcium 9.2  8.4 - 10.5 (mg/dL)    Total Protein 6.6  6.0 - 8.3 (g/dL)    Albumin 3.8  3.5 - 5.2 (g/dL)    AST 19  0 - 37 (U/L)    ALT 17  0 - 35 (U/L)    Alkaline Phosphatase 74  39 - 117 (U/L)    Total Bilirubin 0.4  0.3 - 1.2 (mg/dL)    GFR calc non Af Amer >60  >60 (mL/min)    GFR calc Af Amer >60  >60 (mL/min)   URINALYSIS, ROUTINE W REFLEX MICROSCOPIC     Status: Abnormal   Collection Time   11/26/10 12:39 PM      Component Value Range Comment   Color, Urine YELLOW  YELLOW      Appearance CLEAR  CLEAR     Specific Gravity, Urine 1.020  1.005 - 1.030     pH 7.0  5.0 - 8.0     Glucose, UA NEGATIVE  NEGATIVE (mg/dL)    Hgb urine dipstick NEGATIVE  NEGATIVE     Bilirubin Urine SMALL (*) NEGATIVE     Ketones, ur 40 (*) NEGATIVE (mg/dL)    Protein, ur 30 (*) NEGATIVE (mg/dL)    Urobilinogen, UA 0.2  0.0 - 1.0 (mg/dL)    Nitrite NEGATIVE  NEGATIVE     Leukocytes, UA NEGATIVE  NEGATIVE    URINE MICROSCOPIC-ADD ON     Status: Normal   Collection Time   11/26/10 12:39 PM      Component Value Range Comment   Squamous Epithelial / LPF RARE  RARE     WBC, UA 0-2  <3 (WBC/hpf)    RBC / HPF 0-2  <3 (RBC/hpf)   CBC     Status: Abnormal   Collection Time   11/26/10  3:28 PM      Component Value Range Comment   WBC 7.5  4.0 - 10.5 (K/uL)    RBC 4.49  3.87 - 5.11 (MIL/uL)    Hemoglobin 12.2  12.0 - 15.0 (g/dL)    HCT 09.6 (*) 04.5 - 46.0 (%)    MCV 79.1  78.0 - 100.0 (fL)    MCH 27.2  26.0 - 34.0 (pg)    MCHC 34.4  30.0 - 36.0 (g/dL)    RDW 40.9 (*) 81.1 - 15.5 (%)    Platelets 354  150 - 400 (K/uL)   COMPREHENSIVE METABOLIC PANEL     Status: Abnormal   Collection Time  11/26/10  3:28 PM      Component Value Range Comment   Sodium 128 (*) 135 - 145 (mEq/L)    Potassium 3.4 (*) 3.5 - 5.1 (mEq/L)    Chloride 97  96 - 112 (mEq/L)    CO2 18 (*) 19 - 32 (mEq/L)    Glucose, Bld 114 (*) 70 - 99 (mg/dL)    BUN 6  6 - 23 (mg/dL)    Creatinine, Ser 1.47  0.50 - 1.10 (mg/dL)    Calcium 8.2 (*) 8.4 - 10.5 (mg/dL)    Total Protein 6.0  6.0 - 8.3 (g/dL)    Albumin 3.4 (*) 3.5 - 5.2 (g/dL)    AST 18  0 - 37 (U/L)    ALT 14  0 - 35 (U/L)    Alkaline Phosphatase 65  39 - 117 (U/L)    Total Bilirubin 0.3  0.3 - 1.2 (mg/dL)    GFR calc non Af Amer >60  >60 (mL/min)    GFR calc Af Amer >60  >60 (mL/min)   DIFFERENTIAL     Status: Normal   Collection Time   11/26/10  3:28 PM      Component Value Range Comment   Neutrophils Relative 75  43 - 77 (%)    Neutro Abs 5.6  1.7 - 7.7  (K/uL)    Lymphocytes Relative 16  12 - 46 (%)    Lymphs Abs 1.2  0.7 - 4.0 (K/uL)    Monocytes Relative 9  3 - 12 (%)    Monocytes Absolute 0.6  0.1 - 1.0 (K/uL)    Eosinophils Relative 0  0 - 5 (%)    Eosinophils Absolute 0.0  0.0 - 0.7 (K/uL)    Basophils Relative 0  0 - 1 (%)    Basophils Absolute 0.0  0.0 - 0.1 (K/uL)    No results found for this or any previous visit (from the past 240 hour(s)).  Dg Abd Acute W/chest  10/30/2010  *RADIOLOGY REPORT*  Clinical Data: Abdominal pain  ACUTE ABDOMEN SERIES (ABDOMEN 2 VIEW & CHEST 1 VIEW)  Comparison: Chest radiograph dated 05/12/2009.  Findings: Lungs are clear. No pleural effusion or pneumothorax.  Cardiomediastinal silhouette is within normal limits.  Nonobstructive bowel gas pattern.  No evidence of free air under the diaphragm on the upright view.  Cholecystectomy clips.  Degenerative changes of the visualized thoracolumbar spine.  Prior posterior surgical fixation at L5 and S1.  Right hip arthroplasty.  IMPRESSION: No evidence of acute cardiopulmonary disease.  No evidence of small bowel obstruction or free air.  Postsurgical changes, as described above.  Original Report Authenticated By: Charline Bills, M.D.   Impression: She has recurrent abdominal pain. She has a history of peptic ulcer disease. 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 She has diabetes. She has hypertension.  Active Problems:  * No active hospital problems. *      Plan: She'll be treated with twice a day PPI she'll have repeat lab work I've asked for GI consultation.      Yanely Mast L 11/26/2010, 6:40 PM

## 2010-11-26 NOTE — ED Notes (Signed)
Pt c/o nausea and vomiting x 2 days. Pt states that she has been in the hospital several times for the same thing in the last month. Pt alert and oriented. No c/o pain at this time. No active vomiting.

## 2010-11-26 NOTE — ED Provider Notes (Addendum)
Chart scribed for Donnetta Hutching, MD by [facility]; the patient was seen in room APA01/APA01; this patient's care was started at 9:14 AM.    CSN: [id number] Arrival date & time: 11/26/2010  8:52 AM  Chief Complaint  Patient presents with  . Abdominal Pain   HPI Rebecca Hartman is a 65 y.o. female who presents to the Emergency Department complaining of n/v/d. Pt c/o n/v/d with associated periumbilical abd pain that has been intermittent x 1 month. Pt seen in ED 10/24/10 after initial onset and has been in ED/hospital several times since, pt with upper GI scope and dx with  bleeding ulcer, cauterized by Dr. Karilyn Cota (10/30/08). Pt with persistent vomiting again in past 24 hours, now with dry heaving; several episodes of diarrhea in past 24 hours as well. Pt has not been eating or drinking well for past 2 days; has tried zofran with minimal relief.  PCP Dr. Juanetta Gosling  Past Medical History  Diagnosis Date  . Hypertension   . Diabetes mellitus   . Hypothyroidism   . Fibromyalgia   . Chronic back pain   . Anemia   . Nausea & vomiting   . Hiatal hernia     Past Surgical History  Procedure Date  . Tonsillectomy   . Cholecystectomy   . Carpal tunnel release   . Back surgery   . Total hip revision   . Abdominal hysterectomy   . Esophagogastroduodenoscopy 10/31/2010    Procedure: ESOPHAGOGASTRODUODENOSCOPY (EGD);  Surgeon: Malissa Hippo, MD;  Location: [facility];  Service: Endoscopy;  Laterality: N/A;  . Colonoscopy 08/06/06 FIELDS  . Upper gastrointestinal endoscopy 10/03/2010    EGD DILATN PYLORIC CHANNEL  . Upper gastrointestinal endoscopy 07/08/2010  . Upper gastrointestinal endoscopy 08/06/06    FIELDS  . Upper gastrointestinal endoscopy 06/29/2008    EGD    History reviewed. No pertinent family history.  History  Substance Use Topics  . Smoking status: Never Smoker   . Smokeless tobacco: Never Used  . Alcohol Use: No    OB History    Grav Para Term Preterm Abortions TAB  SAB Ect Mult Living                 Previous Medications   CLOBETASOL (TEMOVATE) 0.05 % CREAM    Apply topically 2 (two) times daily. TO LEGS    CYCLOBENZAPRINE (FLEXERIL) 10 MG TABLET    Take 10 mg by mouth 2 (two) times daily as needed. For muscle spasms   DICYCLOMINE (BENTYL) 10 MG CAPSULE    Take 10-20 mg by mouth 4 (four) times daily -  before meals and at bedtime. 1-2 capsules before each meal   DILTIAZEM (CARDIZEM) 120 MG TABLET    Take 120 mg by mouth 2 (two) times daily.    ESTRADIOL (ESTRACE) 0.5 MG TABLET    Take 0.5 mg by mouth daily.     FLUOCINONIDE (LIDEX) 0.05 % EXTERNAL SOLUTION    Apply 1 application topically Daily. TO LEGS   FUROSEMIDE (LASIX) 40 MG TABLET    Take 40 mg by mouth daily.     GABAPENTIN (NEURONTIN) 600 MG TABLET    Take 600 mg by mouth 3 (three) times daily as needed.    HYDROCODONE-ACETAMINOPHEN (NORCO) 5-325 MG PER TABLET    Take 1 tablet by mouth 4 (four) times daily as needed. For pain   HYDROXYZINE (ATARAX) 50 MG TABLET    Take 50 mg by mouth 3 (three) times daily as needed. For  itching   HYDROXYZINE (VISTARIL) 50 MG CAPSULE    Take 1 tablet by mouth Once daily as needed. FOR ITCHING   LANSOPRAZOLE (PREVACID) 30 MG CAPSULE    Take 30 mg by mouth daily.     LEVOTHYROXINE (SYNTHROID, LEVOTHROID) 50 MCG TABLET    Take 50 mcg by mouth daily.     LISINOPRIL (PRINIVIL,ZESTRIL) 10 MG TABLET    Take 10 mg by mouth daily.     LORAZEPAM (ATIVAN) 1 MG TABLET    Take 1 mg by mouth at bedtime. For sleep   METFORMIN (GLUCOPHAGE) 500 MG TABLET    Take 500 mg by mouth 2 (two) times daily with a meal. TAKE AFTERNOON DOSE IF NEEDED   METOPROLOL (LOPRESSOR) 50 MG TABLET    Take 50 mg by mouth 2 (two) times daily.    MUPIROCIN (BACTROBAN) 2 % OINTMENT    Apply topically 2 (two) times daily.   ONDANSETRON (ZOFRAN) 4 MG TABLET    Take 4 mg by mouth every 8 (eight) hours as needed. For nausea    POTASSIUM CHLORIDE SA (K-DUR,KLOR-CON) 20 MEQ TABLET    Take 2 tablets (40 mEq  total) by mouth 2 (two) times daily.   PROPYLENE GLYCOL (SYSTANE BALANCE OP)    Apply 2 drops to eye daily as needed. FOR DRY EYES       Allergies as of 11/26/2010 - Review Complete 11/26/2010  Allergen Reaction Noted  . Shellfish allergy Itching and Nausea And Vomiting 10/23/2010  . Levaquin Itching 09/03/2010  . Lisinopril Other (See Comments) 10/23/2010  . Morphine Itching and Nausea Only   . Pantoprazole sodium (protonix) Nausea And Vomiting 10/25/2010     Review of Systems  Physical Exam  BP 165/82  Pulse 100  Temp(Src) 98.2 F (36.8 C) (Oral)  Resp 20  Ht 5\' 4"  (1.626 m)  Wt 155 lb (70.308 kg)  BMI 26.61 kg/m2  SpO2 100%  Physical Exam  Nursing note and vitals reviewed. Constitutional: She is oriented to person, place, and time. She appears well-developed. No distress.       Appearance consistent with age of record; appears dehydrated  HENT:  Head: Normocephalic and atraumatic.  Right Ear: External ear normal.  Left Ear: External ear normal.  Nose: Nose normal.  Mouth/Throat: Mucous membranes are dry.  Eyes: Conjunctivae are normal.  Neck: Neck supple.  Cardiovascular: Normal rate and regular rhythm.  Exam reveals no gallop and no friction rub.   No murmur heard. Pulmonary/Chest: Effort normal and breath sounds normal. She has no wheezes. She has no rhonchi. She has no rales. She exhibits no tenderness.  Abdominal: Soft. There is tenderness (periumbilical tenderness).  Musculoskeletal: Normal range of motion.       Normal appearance of extremities  Neurological: She is alert and oriented to person, place, and time. No sensory deficit.  Skin: No rash noted.       Color normal  Psychiatric: She has a normal mood and affect.   Procedures - none  OTHER DATA REVIEWED: Nursing notes and vital signs reviewed. Prior records reviewed.   LABS / RADIOLOGY: Results for orders placed during the hospital encounter of 11/26/10  CBC      Component Value Range   WBC  7.2  4.0 - 10.5 (K/uL)   RBC 5.05  3.87 - 5.11 (MIL/uL)   Hemoglobin 13.5  12.0 - 15.0 (g/dL)   HCT 29.5  28.4 - 13.2 (%)   MCV 79.0  78.0 - 100.0 (fL)  MCH 26.7  26.0 - 34.0 (pg)   MCHC 33.8  30.0 - 36.0 (g/dL)   RDW 09.8 (*) 11.9 - 15.5 (%)   Platelets 380  150 - 400 (K/uL)  DIFFERENTIAL      Component Value Range   Neutrophils Relative 74  43 - 77 (%)   Neutro Abs 5.3  1.7 - 7.7 (K/uL)   Lymphocytes Relative 16  12 - 46 (%)   Lymphs Abs 1.1  0.7 - 4.0 (K/uL)   Monocytes Relative 10  3 - 12 (%)   Monocytes Absolute 0.7  0.1 - 1.0 (K/uL)   Eosinophils Relative 0  0 - 5 (%)   Eosinophils Absolute 0.0  0.0 - 0.7 (K/uL)   Basophils Relative 0  0 - 1 (%)   Basophils Absolute 0.0  0.0 - 0.1 (K/uL)  COMPREHENSIVE METABOLIC PANEL      Component Value Range   Sodium 130 (*) 135 - 145 (mEq/L)   Potassium 3.3 (*) 3.5 - 5.1 (mEq/L)   Chloride 94 (*) 96 - 112 (mEq/L)   CO2 21  19 - 32 (mEq/L)   Glucose, Bld 130 (*) 70 - 99 (mg/dL)   BUN 6  6 - 23 (mg/dL)   Creatinine, Ser 1.47  0.50 - 1.10 (mg/dL)   Calcium 9.2  8.4 - 82.9 (mg/dL)   Total Protein 6.6  6.0 - 8.3 (g/dL)   Albumin 3.8  3.5 - 5.2 (g/dL)   AST 19  0 - 37 (U/L)   ALT 17  0 - 35 (U/L)   Alkaline Phosphatase 74  39 - 117 (U/L)   Total Bilirubin 0.4  0.3 - 1.2 (mg/dL)   GFR calc non Af Amer >60  >60 (mL/min)   GFR calc Af Amer >60  >60 (mL/min)  URINALYSIS, ROUTINE W REFLEX MICROSCOPIC      Component Value Range   Color, Urine YELLOW  YELLOW    Appearance CLEAR  CLEAR    Specific Gravity, Urine 1.020  1.005 - 1.030    pH 7.0  5.0 - 8.0    Glucose, UA NEGATIVE  NEGATIVE (mg/dL)   Hgb urine dipstick NEGATIVE  NEGATIVE    Bilirubin Urine SMALL (*) NEGATIVE    Ketones, ur 40 (*) NEGATIVE (mg/dL)   Protein, ur 30 (*) NEGATIVE (mg/dL)   Urobilinogen, UA 0.2  0.0 - 1.0 (mg/dL)   Nitrite NEGATIVE  NEGATIVE    Leukocytes, UA NEGATIVE  NEGATIVE   URINE MICROSCOPIC-ADD ON      Component Value Range   Squamous Epithelial /  LPF RARE  RARE    WBC, UA 0-2  <3 (WBC/hpf)   RBC / HPF 0-2  <3 (RBC/hpf)     ED COURSE: 12:58 PM - Pt still not feeling well, all results discussed, pt prefers admission if possible   MDM: Recheck at 1454. The patient still feeling nauseated. Labs reviewed with patient and husband. Discussed with Dr. Juanetta Gosling. Will admit.  IMPRESSION: 1. Other and unspecified noninfectious gastroenteritis and colitis      PLAN:  All results reviewed and discussed with pt, questions answered, pt agreeable with plan.   CONDITION ON DISCHARGE:     MEDS GIVEN IN ED:  Medications  fluocinonide (LIDEX) 0.05 % external solution (not administered)  hydrOXYzine (VISTARIL) 50 MG capsule (not administered)  diltiazem (CARDIZEM CD) 120 MG 24 hr capsule (not administered)  Propylene Glycol (SYSTANE BALANCE OP) (not administered)  ondansetron (ZOFRAN) injection 4 mg (4 mg Intravenous Given 11/26/10  1107)  sodium chloride 0.9 % bolus 1,000 mL (1000 mL Intravenous Given 11/26/10 1109)  sodium chloride 0.9 % bolus 1,000 mL (1000 mL Intravenous Given 11/26/10 0945)  ondansetron (ZOFRAN) injection 4 mg (4 mg Intravenous Given 11/26/10 1428)     DISCHARGE MEDICATIONS: New Prescriptions   No medications on file     SCRIBE ATTESTATION: I personally performed the services described in this documentation, which was scribed in my presence. The recorded information has been reviewed and considered. Donnetta Hutching, MD       Donnetta Hutching, MD 11/26/10 1455  Donnetta Hutching, MD 11/26/10 1455

## 2010-11-26 NOTE — ED Notes (Signed)
Complain of n/v and abd pain off and on for months

## 2010-11-27 ENCOUNTER — Encounter (HOSPITAL_COMMUNITY): Payer: Medicare Other

## 2010-11-27 LAB — DIFFERENTIAL
Eosinophils Relative: 1 % (ref 0–5)
Lymphocytes Relative: 23 % (ref 12–46)
Lymphs Abs: 1.7 10*3/uL (ref 0.7–4.0)
Monocytes Absolute: 0.8 10*3/uL (ref 0.1–1.0)
Neutro Abs: 4.8 10*3/uL (ref 1.7–7.7)

## 2010-11-27 LAB — CBC
HCT: 35.5 % — ABNORMAL LOW (ref 36.0–46.0)
MCV: 80.1 fL (ref 78.0–100.0)
Platelets: 365 10*3/uL (ref 150–400)
RBC: 4.43 MIL/uL (ref 3.87–5.11)
WBC: 7.4 10*3/uL (ref 4.0–10.5)

## 2010-11-27 LAB — BASIC METABOLIC PANEL
CO2: 18 mEq/L — ABNORMAL LOW (ref 19–32)
Chloride: 100 mEq/L (ref 96–112)
Sodium: 130 mEq/L — ABNORMAL LOW (ref 135–145)

## 2010-11-27 LAB — GLUCOSE, CAPILLARY
Glucose-Capillary: 107 mg/dL — ABNORMAL HIGH (ref 70–99)
Glucose-Capillary: 107 mg/dL — ABNORMAL HIGH (ref 70–99)

## 2010-11-27 MED ORDER — PANTOPRAZOLE SODIUM 40 MG PO TBEC
40.0000 mg | DELAYED_RELEASE_TABLET | Freq: Once | ORAL | Status: AC
  Administered 2010-11-27: 40 mg via ORAL
  Filled 2010-11-27: qty 1

## 2010-11-27 MED ORDER — SODIUM CHLORIDE 0.9 % IJ SOLN
INTRAMUSCULAR | Status: AC
  Filled 2010-11-27: qty 10

## 2010-11-27 MED ORDER — DICYCLOMINE HCL 10 MG PO CAPS
10.0000 mg | ORAL_CAPSULE | Freq: Three times a day (TID) | ORAL | Status: DC
  Administered 2010-11-27 – 2010-11-28 (×3): 10 mg via ORAL
  Filled 2010-11-27 (×3): qty 1

## 2010-11-27 MED ORDER — SODIUM CHLORIDE 0.9 % IJ SOLN
INTRAMUSCULAR | Status: AC
  Administered 2010-11-27: 11:00:00
  Filled 2010-11-27: qty 10

## 2010-11-27 NOTE — Progress Notes (Signed)
UR Chart Review Completed  

## 2010-11-27 NOTE — Progress Notes (Signed)
Subjective: She was admitted with nausea vomiting abdominal pain and diarrhea. She has not had anymore vomiting since admission but she still does have some diarrhea and is still complaining of abdominal pain  Objective: Vital signs in last 24 hours: Temp:  [98.1 F (36.7 C)-98.7 F (37.1 C)] 98.1 F (36.7 C) (08/29 0620) Pulse Rate:  [70-118] 70  (08/29 0620) Resp:  [20-22] 20  (08/29 0620) BP: (137-190)/(80-121) 190/86 mmHg (08/29 0620) SpO2:  [88 %-100 %] 99 % (08/29 0620) Weight:  [70.308 kg (155 lb)-70.761 kg (156 lb)] 156 lb (70.761 kg) (08/28 1703) Weight change:  Last BM Date: 11/26/10  Intake/Output from previous day: 08/28 0701 - 08/29 0700 In: 100 [P.O.:100] Out: -   PHYSICAL EXAM General appearance: alert, cooperative, mild distress and moderately obese Resp: clear to auscultation bilaterally Cardio: regular rate and rhythm, S1, S2 normal, no murmur, click, rub or gallop GI: Still mildly diffusely tender with hyperactive bowel sounds Extremities: extremities normal, atraumatic, no cyanosis or edema  Lab Results:    Basic Metabolic Panel:  Basename 11/27/10 0500 11/26/10 1528  NA 130* 128*  K 3.7 3.4*  CL 100 97  CO2 18* 18*  GLUCOSE 108* 114*  BUN 7 6  CREATININE 0.54 0.50  CALCIUM 8.4 8.2*  MG -- --  PHOS -- --   Liver Function Tests:  [facility] 11/26/10 1528 11/26/10 0939  AST 18 19  ALT 14 17  ALKPHOS 65 74  BILITOT 0.3 0.4  PROT 6.0 6.6  ALBUMIN 3.4* 3.8    Basename 11/26/10 1528  LIPASE 44  AMYLASE --   No results found for this basename: AMMONIA:2 in the last 72 hours CBC:  Basename 11/27/10 0500 11/26/10 1528  WBC 7.4 7.5  NEUTROABS 4.8 5.6  HGB 11.9* 12.2  HCT 35.5* 35.5*  MCV 80.1 79.1  PLT 365 354   Cardiac Enzymes: No results found for this basename: CKTOTAL:3,CKMB:3,CKMBINDEX:3,TROPONINI:3 in the last 72 hours BNP: No results found for this basename: POCBNP:3 in the last 72 hours D-Dimer: No results found for this  basename: DDIMER:2 in the last 72 hours CBG:  Basename 11/27/10 0745 11/26/10 2055  GLUCAP 107* 113*   Hemoglobin A1C: No results found for this basename: HGBA1C in the last 72 hours Fasting Lipid Panel: No results found for this basename: CHOL,HDL,LDLCALC,TRIG,CHOLHDL,LDLDIRECT in the last 72 hours Thyroid Function Tests: No results found for this basename: TSH,T4TOTAL,FREET4,T3FREE,THYROIDAB in the last 72 hours Anemia Panel: No results found for this basename: VITAMINB12,FOLATE,FERRITIN,TIBC,IRON,RETICCTPCT in the last 72 hours Urine Drug Screen:  Alcohol Level: No results found for this basename: ETH:2 in the last 72 hours Urinalysis:  Misc. Labs:  ABGS No results found for this basename: PHART,PCO2,PO2ART,TCO2,HCO3 in the last 72 hours CULTURES No results found for this or any previous visit (from the past 240 hour(s)). Studies/Results: No results found.  Medications:  Scheduled:   . clobetasol   Topical BID  . dicyclomine  10-20 mg Oral TID AC & HS  . diltiazem  120 mg Oral Daily  . enoxaparin  40 mg Subcutaneous Q24H  . estradiol  0.5 mg Oral Daily  . insulin aspart  0-15 Units Subcutaneous TID WC  . insulin aspart  0-5 Units Subcutaneous QHS  . levothyroxine  50 mcg Oral Daily  . LORazepam  1 mg Oral QHS  . metoprolol  50 mg Oral BID  . mupirocin   Topical BID  . ondansetron  4 mg Intravenous Once  . ondansetron  4 mg Intravenous  Once  . pantoprazole (PROTONIX) IV  40 mg Intravenous Q12H  . potassium chloride SA  40 mEq Oral BID  . sodium chloride  1,000 mL Intravenous Once  . sodium chloride  1,000 mL Intravenous Once  . sodium chloride       Continuous:   . sodium chloride 125 mL/hr at 11/27/10 1610   RUE:[id number], acetaminophen, HYDROcodone-acetaminophen, HYDROmorphone, hydrOXYzine, ondansetron (ZOFRAN) IV, ondansetron, promethazine, zolpidem, DISCONTD: hydrOXYzine  Assesment: She has abdominal pain associated with nausea and vomiting again.  She has peptic ulcer disease and had EGD done earlier this month and cauterization of an ulcer. She has recurrence of her symptoms now. She appears to be dehydrated. She has been hypokalemic. She was mildly hypertensive which is better. Principal Problem:  *Abdominal pain Active Problems:  HIGH BLOOD PRESSURE  MRSA infection, recurrent  Hypothyroidism  Fibromyalgia  Dehydration  Hypokalemia, gastrointestinal losses  Diabetes mellitus type 2 in obese  Peptic ulcer disease    Plan: Continue with clear liquid diet medications for nausea and pain and I've asked for GI consultation    LOS: 1 day   Jaxon Flatt L 11/27/2010, 8:43 AM

## 2010-11-27 NOTE — Consult Note (Signed)
NAMEYADIRA, HADA NO.:  [id number]  MEDICAL RECORD NO.:  [id number]  LOCATION:  A318                          FACILITY:  [facility]  PHYSICIAN:  Lionel December, M.D.    DATE OF BIRTH:  Aug 26, 1945  DATE OF CONSULTATION:  11/27/2010 DATE OF DISCHARGE:                                CONSULTATION   REASON FOR CONSULTATION:  Recurrent nausea, vomiting, and diarrhea in a patient with complicated history of peptic ulcer disease.  HISTORY OF PRESENT ILLNESS:  Liz is a 65 year old Caucasian female with complicated GI history who was admitted to this facility between October 30, 2010, and November 03, 2010, for upper GI bleed secondary to pylorobulbar ulcer.  She was discharged on November 03, 2010.  That hospitalization was pertinent for 2 units of PRBCs, and she was also diagnosed with Klebsiella UTI and therapy completed with Macrodantin on an outpatient basis.  History is provided by the patient and her husband and he was in the room.  The patient states that after she left this facility gradually she got better, and she had very good 12 days.  She had a good appetite and she was trying to consume foods rich in iron.  She did not have any symptoms whatsoever.  However 5 or 6 days ago, she developed loose watery stools. She had 4 stools one day.  Next day which was weekend, she had diarrhea and she also had few episodes of emesis.  She vomited potatoes that she had eaten several hours earlier and she also vomited bowel but no blood. On Sunday which would be 2 days prior to admission, she heaved all day but did not throw up any but she did not eat anything.  Yesterday, she had nausea and vomiting, and she therefore came to emergency room and she was felt to be dehydrated.  The patient was hospitalized and begun on IV fluids and IV PPI as well as her usual medications.  This afternoon she feels much better.  She states she is having hunger pains. She does not like liquid  diet.  She denies heartburn or nausea today. She also denies melena or rectal bleeding.  She may have gained couple of pounds since her last hospitalization.  She denies using NSAIDs.  She is on pain medication, and she generally takes no more than one a day and usually at night so she could rest.  She denies dysuria or flank pain.  At home, she has been on Flexeril 10 mg twice daily as needed, dicyclomine 10-20 mg four times a day as needed, Diltiazem 120 mg once daily, Estrace 0.5 mg p.o. daily, Lidex 0.05% solution topically to skin over lower extremities daily, clobetasol 0.05% to her lower extremities twice daily.  Furosemide 40 mg p.o. daily, gabapentin 600 mg three times a day, Norco 5/325 one tablet q.i.d. p.r.n., Prevacid 30 mg twice daily, levothyroxine 50 mcg daily, lorazepam 1 mg p.o. nightly, metformin 500 mg twice daily, metoprolol 50 mg twice daily.  CURRENT MEDICATIONS:  Pantoprazole 40 mg IV q.12 h., she is on IV fluids with normal saline.  Dicyclomine 20 mg four times a day.  Diltiazem 120 mg p.o. daily,  Lovenox 40 mg subcu q.24 h., Estrace 0.5 mg p.o. daily, insulin via sliding scale, levothyroxine 50 mcg p.o. daily, lorazepam 1 mg p.o. nightly, metoprolol 50 mg p.o. b.i.d., clobetasol and mupirocin topical twice daily.  P.r.n. medications include acetaminophen 650 mg p.o. q.6 p.r.n., hydrocodone and acetaminophen 5/325 p.o. q.4 p.r.n., Dilaudid 0.5 mg IV q.2 p.r.n., Atarax 50 mg p.o. q.6 p.r.n., Zofran 4 mg IV or p.o. q.6 p.r.n., promethazine 12.5 mg IV q.6 p.r.n., zolpidem 5 mg p.o. nightly p.r.n.  PAST MEDICAL HISTORY:  She has history of peptic ulcer disease secondary to NSAID therapy initially diagnosed in May 2008 by Dr. Darrick Penna and then she was found to have pyloric channel ulcer.  She had EGD in April 2010 by me in [place], revealing prepyloric scar and bulbar and pyloric channel ulcers which were felt to be healing and a small sliding hiatal hernia.  In  March this year, she had abnormal upper GI series and which led to another EGD in April 2012, and she had a small sliding type of hernia, deep pyloric channel ulcer as well as a bulbar ulcer with a stricture. Her H. pylori serology was repeated was negative as has been the case previously.  She was maintained on double dose lansoprazole.  She had followup EGD on October 03, 2010, and once again she had small sliding hiatal hernia partially healed pyloric channel ulcer with a stricture.  The stricture was dilated with 15 mm balloon, and she also had a distal bulbar stricture which was dilated to 15 mm with a balloon.  She was admitted to this facility earlier this month with upper GI bleed.  She had EGD on October 31, 2010 and she was noted to have ulcerative reflux esophagitis, hiatal hernia, pyloric channel ulcer with stigmata of bleeding, and treated with gold probe.  She also had distal bulbar ulcer with a stricture and noncritical narrowing.  She has iron-deficiency anemia felt to be due to GI bleed and perhaps for iron absorption because she is chronically acid suppressed.  On her last visit, gastrin level was checked it was 165 and plans were for to be repeated.  I felt that this was probably related to her PPI and perhaps intermittent peptic ulcer disease with stasis.  During her last admission, she had a fasting cortisol level which was 15.4 (normal).  She has been hypertensive for about 6 years.  Diabetes mellitus was diagnosed 3 years ago.  Hypothyroidism was diagnosed about a year ago. She has fibromyalgia.  She has chronic low back pain secondary to arthritis.  She had lumbar spine surgery with hardware placement done 4 years ago.  She had right hip replacement for severe osteoarthrosis about 6 years ago.  She had BSO with hysterectomy in 1997.  She had tonsillectomy over 35 years ago and cholecystectomy few years ago.  She has had decompression for bilateral carpal tunnel  syndrome about 10 years ago.  She also has irritable bowel syndrome.  ALLERGIES:  LEVAQUIN and SHELLFISH allergy.  However, she has not experienced any problems with IV contrast in the past.  Her last CT was in November 2006.  FAMILY HISTORY:  Both parents are diseased.  Mother was diabetic and died at age 41.  Father lived to be 52 and died of CVA.  She has one brother with multiple medical problems including CAD and COPD is in poor health.  SOCIAL HISTORY:  She is married, her husband, Lloyd Huger, is in the room with her. They have  one grown up son who is in good health.  Melanye used to work at [employer] where she was for 18 years and lost her job and company moved out.  She has never smoked cigarettes or drank alcohol.  PHYSICAL EXAMINATION:  VITAL SIGNS:  Admission weight 156 pounds.  She is 64 inches tall, pulse 72 per minute, blood pressure 138/74, respirations 20 and temperature is 98.4.  She has remained afebrile during this admission. HEENT:  Conjunctivae is pink.  Sclerae nonicteric.  Oropharyngeal mucosa is normal. NECK:  No neck masses or thyromegaly noted. CARDIAC:  Regular rhythm.  Normal S1 and S3.  No murmur or gallop noted. LUNGS:  Clear to auscultation. ABDOMEN:  Symmetrical.  Bowel sounds are normal on palpation, very soft abdomen with minimal tenderness in the midepigastric region on deep palpation, no organomegaly or masses. EXTREMITIES:  She does not have peripheral edema or clubbing, but she has multiple they multiple bruises over forearms.  No clubbing or koilonychia.  LABORATORY DATA:  Lab data from this admission CBC, WBC 7.2, H and H 13.5 an 39.9, platelet count 380K.  Her MCV was 79.  Please note her H and H on November 03, 2010, when she was discharged was 9.7 and 28.9.  Chemistry Panel:  Sodium 130, potassium 3.3, chloride 94, CO2 of 21, glucose 130, BUN 6, creatinine 0.60, calcium 9.2, bilirubin 0.4, AP 74, AST 19, ALT 17, total protein 6.6 with albumin of  3.8. Urinalysis reveals 0 to 2 wbc's and 0-2 rbc's per high-power field, serum lipase was 44.  CBC from this morning H and H is 11.9 and 35.5, WBC is 7.4.  ASSESSMENT:  Marchetta is a 65 year old Caucasian female with complicated history of peptic ulcer disease whose last EGD was on October 31, 2010, when she was bleeding from pyloric channel ulcer and required endoscopic therapy.  Now, she presents with nonbloody diarrhea for 2 days followed by nausea, vomiting.  She is feeling better with IV hydration and PPI. No history of fever.  Her stool C diff by PCR has been negative.  She did receive Cipro for few days during her last hospitalization and Macrodantin on an outpatient basis for Klebsiella UTI, but I doubt that her symptoms are due to C. diff colitis.  She has had recurrent peptic ulcer disease.  Her serum gastrin was mildly elevated but since her symptoms are relapsing we need to make sure she does not have gastrinoma.  It is possible that her acute symptoms are due to food born illness or viral gastroenteritis.  The only problem is that they have been occurring too frequency.  She could also have gastroparesis which would explain her nausea and vomiting but not her diarrhea.  RECOMMENDATIONS:  We will advance her diet to diabetic full liquids and she is feeling better.  Abdominopelvic CT without and with contrast focus pancreas to be performed in a.m..  We will repeat her fasting gastrin level in a.m.  I would  recommend dropping her dicyclomine to 10 mg before each meal.  Further recommendations to follow.  We appreciate the opportunity to participate in the care of this nice lady.          ______________________________ Lionel December, M.D.     NR/MEDQ  D:  11/27/2010  T:  11/27/2010  Job:  [id number]

## 2010-11-27 NOTE — Consult Note (Signed)
Job [id number]

## 2010-11-28 ENCOUNTER — Inpatient Hospital Stay (HOSPITAL_COMMUNITY): Payer: Medicare Other

## 2010-11-28 MED ORDER — SODIUM CHLORIDE 0.9 % IJ SOLN
INTRAMUSCULAR | Status: AC
  Administered 2010-11-28: 10 mL
  Filled 2010-11-28: qty 10

## 2010-11-28 MED ORDER — IOHEXOL 300 MG/ML  SOLN
100.0000 mL | Freq: Once | INTRAMUSCULAR | Status: AC | PRN
  Administered 2010-11-28: 100 mL via INTRAVENOUS

## 2010-11-28 NOTE — Progress Notes (Signed)
Subjective: She says she feels better. She's not had any more abdominal pain and very little diarrhea if any. She has tolerated full liquid diet so far. She is scheduled for CT today.  Objective: Vital signs in last 24 hours: Temp:  [97.8 F (36.6 C)-98.4 F (36.9 C)] 97.8 F (36.6 C) (08/30 0627) Pulse Rate:  [72-77] 76  (08/30 0627) Resp:  [20] 20  (08/30 0627) BP: (138-196)/(74-102) 171/102 mmHg (08/30 0627) SpO2:  [100 %] 100 % (08/30 0627) Weight change:  Last BM Date: 11/27/10  Intake/Output from previous day: 08/29 0701 - 08/30 0700 In: 1850 [P.O.:350; I.V.:1500] Out: 25 [Urine:25]  PHYSICAL EXAM General appearance: alert and no distress Resp: clear to auscultation bilaterally Cardio: regular rate and rhythm, S1, S2 normal, no murmur, click, rub or gallop GI: Minimal tenderness diffusely Extremities: extremities normal, atraumatic, no cyanosis or edema  Lab Results:    Basic Metabolic Panel:  Basename 11/27/10 0500 11/26/10 1528  NA 130* 128*  K 3.7 3.4*  CL 100 97  CO2 18* 18*  GLUCOSE 108* 114*  BUN 7 6  CREATININE 0.54 0.50  CALCIUM 8.4 8.2*  MG -- --  PHOS -- --   Liver Function Tests:  [facility] 11/26/10 1528 11/26/10 0939  AST 18 19  ALT 14 17  ALKPHOS 65 74  BILITOT 0.3 0.4  PROT 6.0 6.6  ALBUMIN 3.4* 3.8    Basename 11/26/10 1528  LIPASE 44  AMYLASE --   No results found for this basename: AMMONIA:2 in the last 72 hours CBC:  Basename 11/27/10 0500 11/26/10 1528  WBC 7.4 7.5  NEUTROABS 4.8 5.6  HGB 11.9* 12.2  HCT 35.5* 35.5*  MCV 80.1 79.1  PLT 365 354   Cardiac Enzymes: No results found for this basename: CKTOTAL:3,CKMB:3,CKMBINDEX:3,TROPONINI:3 in the last 72 hours BNP: No results found for this basename: POCBNP:3 in the last 72 hours D-Dimer: No results found for this basename: DDIMER:2 in the last 72 hours CBG:  Basename 11/28/10 0754 11/27/10 2139 11/27/10 1708 11/27/10 1133 11/27/10 0745 11/26/10 2055  GLUCAP 113*  151* 107* 106* 107* 113*   Hemoglobin A1C: No results found for this basename: HGBA1C in the last 72 hours Fasting Lipid Panel: No results found for this basename: CHOL,HDL,LDLCALC,TRIG,CHOLHDL,LDLDIRECT in the last 72 hours Thyroid Function Tests: No results found for this basename: TSH,T4TOTAL,FREET4,T3FREE,THYROIDAB in the last 72 hours Anemia Panel: No results found for this basename: VITAMINB12,FOLATE,FERRITIN,TIBC,IRON,RETICCTPCT in the last 72 hours Urine Drug Screen:  Alcohol Level: No results found for this basename: ETH:2 in the last 72 hours Urinalysis:  Misc. Labs:  ABGS No results found for this basename: PHART,PCO2,PO2ART,TCO2,HCO3 in the last 72 hours CULTURES Recent Results (from the past 240 hour(s))  CLOSTRIDIUM DIFFICILE BY PCR     Status: Normal   Collection Time   11/27/10  8:59 AM      Component Value Range Status Comment   C difficile by pcr NEGATIVE  NEGATIVE  Final    Studies/Results: No results found.  Medications:  Scheduled:   . clobetasol   Topical BID  . dicyclomine  10 mg Oral TID AC  . diltiazem  120 mg Oral Daily  . enoxaparin  40 mg Subcutaneous Q24H  . estradiol  0.5 mg Oral Daily  . insulin aspart  0-15 Units Subcutaneous TID WC  . insulin aspart  0-5 Units Subcutaneous QHS  . levothyroxine  50 mcg Oral Daily  . LORazepam  1 mg Oral QHS  . metoprolol  50  mg Oral BID  . mupirocin   Topical BID  . pantoprazole  40 mg Oral Once  . pantoprazole (PROTONIX) IV  40 mg Intravenous Q12H  . potassium chloride SA  40 mEq Oral BID  . sodium chloride      . sodium chloride      . sodium chloride      . DISCONTD: dicyclomine  10-20 mg Oral TID AC & HS   Continuous:   . sodium chloride 125 mL/hr at 11/27/10 0102   VOZ:[id number], acetaminophen, HYDROcodone-acetaminophen, HYDROmorphone, hydrOXYzine, ondansetron (ZOFRAN) IV, ondansetron, promethazine, zolpidem  Assesment: She has recurrent abdominal pain. She does have peptic ulcer  disease. She had an elevated gastrin level so there is some concern that she may have a gastrinoma. Her other medical problems are well controlled at this point. She is improving Principal Problem:  *Abdominal pain Active Problems:  HIGH BLOOD PRESSURE  MRSA infection, recurrent  Hypothyroidism  Fibromyalgia  Dehydration  Hypokalemia, gastrointestinal losses  Diabetes mellitus type 2 in obese  Peptic ulcer disease    Plan: She'll have a CT today continue with her treatments. Dr. Clovis Cao help is noted and appreciated    LOS: 2 days   Elina Streng L 11/28/2010, 8:53 AM

## 2010-11-28 NOTE — Progress Notes (Signed)
Subjective; Patient has no complaints; no nausea or vomiting in last 24 hours; no abdominal pain either. Objective; BP 179/95  Pulse 73  Temp(Src) 99 F (37.2 C) (Oral)  Resp 20  Ht 5\' 4"  (1.626 m)  Wt 156 lb (70.761 kg)  BMI 26.78 kg/m2  SpO2 99% Abdomen is soft and nontender. Gastrin level pending. A/P CT  Reviewed with Dr. Tyron Russell. Old T12 fracture. Normal appearing pancres. No abnormality to gastric or duodenal wall. Stomach not dilated. Patent Celiac trunk and SMA. Assessment; Recurrent N/V possibly mutifactorial ie; PUD ? Gastroparesis ? Sensitive CTZ. Diarrhea; resolved, separate issue. Recommendations; Diet advanced. DC dicyclomine.Use Imodium prn for diarrhea. Must take PPI twice daily. Domperidone 10 mg po before breakfast and evening mesal; script called to [place] and reasons for using this medication discussed with patient and her husband. Will call patient with result of Gastrin level when completed. OV in one month. EGD in 2 months.

## 2010-11-29 LAB — GLUCOSE, CAPILLARY: Glucose-Capillary: 117 mg/dL — ABNORMAL HIGH (ref 70–99)

## 2010-11-29 MED ORDER — ESTRADIOL 0.5 MG PO TABS: 0.5000 mg | ORAL_TABLET | Freq: Every day | ORAL | Status: DC

## 2010-11-29 MED ORDER — DICYCLOMINE HCL 10 MG PO CAPS: 10.0000 mg | ORAL_CAPSULE | Freq: Two times a day (BID) | ORAL | Status: DC

## 2010-11-29 NOTE — Discharge Summary (Signed)
Physician Discharge Summary  Patient ID: SERENITY BATLEY MRN: [id number] DOB/AGE: Jun 15, 1945 65 y.o. Primary Care Physician:Alizabeth Antonio L, MD Admit date: 11/26/2010 Discharge date: 11/29/2010    Discharge Diagnoses:   Principal Problem:  *Abdominal pain Active Problems:  HIGH BLOOD PRESSURE  MRSA infection, recurrent  Hypothyroidism  Fibromyalgia  Dehydration  Hypokalemia, gastrointestinal losses  Diabetes mellitus type 2 in obese  Peptic ulcer disease   Current Discharge Medication List    CONTINUE these medications which have CHANGED   Details  dicyclomine (BENTYL) 10 MG capsule Take 1-2 capsules (10-20 mg total) by mouth 2 (two) times daily before a meal. 1-2 capsules before each meal Qty: 60 capsule, Refills: 5    estradiol (ESTRACE) 0.5 MG tablet Take 1 tablet (0.5 mg total) by mouth daily. Qty: 30 tablet, Refills: 5      CONTINUE these medications which have NOT CHANGED   Details  clobetasol (TEMOVATE) 0.05 % cream Apply topically 2 (two) times daily. TO LEGS     cyclobenzaprine (FLEXERIL) 10 MG tablet Take 10 mg by mouth 2 (two) times daily as needed. For muscle spasms    diltiazem (CARDIZEM CD) 120 MG 24 hr capsule Take 120 mg by mouth daily.      fluocinonide (LIDEX) 0.05 % external solution Apply 1 application topically Daily. TO LEGS    furosemide (LASIX) 40 MG tablet Take 40 mg by mouth daily.      gabapentin (NEURONTIN) 600 MG tablet Take 600 mg by mouth 3 (three) times daily as needed.     HYDROcodone-acetaminophen (NORCO) 5-325 MG per tablet Take 1 tablet by mouth 4 (four) times daily as needed. For pain    lansoprazole (PREVACID) 30 MG capsule Take 30 mg by mouth daily.      levothyroxine (SYNTHROID, LEVOTHROID) 50 MCG tablet Take 50 mcg by mouth daily.      LORazepam (ATIVAN) 1 MG tablet Take 1 mg by mouth at bedtime. For sleep    metFORMIN (GLUCOPHAGE) 500 MG tablet Take 500 mg by mouth 2 (two) times daily with a meal. TAKE AFTERNOON DOSE IF  NEEDED    metoprolol (LOPRESSOR) 50 MG tablet Take 50 mg by mouth 2 (two) times daily.     mupirocin (BACTROBAN) 2 % ointment Apply topically 2 (two) times daily. Qty: 60 g, Refills: 5   Associated Diagnoses: MRSA carrier    ondansetron (ZOFRAN) 4 MG tablet Take 4 mg by mouth every 8 (eight) hours as needed. For nausea     potassium chloride SA (K-DUR,KLOR-CON) 20 MEQ tablet Take 2 tablets (40 mEq total) by mouth 2 (two) times daily. Qty: 120 tablet, Refills: 12    Propylene Glycol (SYSTANE BALANCE OP) Apply 2 drops to eye daily as needed. FOR DRY EYES      diltiazem (CARDIZEM) 120 MG tablet Take 120 mg by mouth 2 (two) times daily.     hydrOXYzine (ATARAX) 50 MG tablet Take 50 mg by mouth 3 (three) times daily as needed. For itching    hydrOXYzine (VISTARIL) 50 MG capsule Take 1 tablet by mouth Once daily as needed. FOR ITCHING    lisinopril (PRINIVIL,ZESTRIL) 10 MG tablet Take 10 mg by mouth daily.          Discharged Condition: Markedly improved with no more nausea or vomiting or diarrhea    Consults: Dr. Karilyn Cota  Significant Diagnostic Studies: Ct Abdomen Pelvis W Wo Contrast  11/28/2010  *RADIOLOGY REPORT*  Clinical Data:  Nausea, vomiting, refractory peptic ulcer disease, elevated gastrin  level, history diabetes  CT ABDOMEN AND PELVIS WITHOUT AND WITH CONTRAST  Technique:  Multidetector CT imaging of the abdomen and pelvis was performed without contrast material in one or both body regions, followed by contrast material(s) and further sections in one or both body regions. Sagittal and coronal MPR images reconstructed from axial data set.  Contrast: Water as oral contrast. 100 ml Omnipaque 300 IV.  Comparison: 02/25/2005  Findings: Lung bases clear. Gallbladder surgically absent. CBD 9 mm diameter, prominent but may be physiologic post cholecystectomy. Tiny hiatal hernia questioned. Scattered atherosclerotic calcifications without aneurysm. Normal pancreatic enhancement. No  evidence of pancreatic mass or nodule identified. Tiny cyst inferior pole right kidney image 76. Liver, spleen, kidneys, and adrenal glands otherwise normal. Stomach and bowel loops normal appearance.  Scattered normal-sized upper abdominal lymph nodes, none pathologically enlarged. Beam hardening artifacts from orthopedic hardware lumbar spine and right hip prosthesis. Uterus surgically absent with nonvisualization ovaries. Questionable normal retrocecal appendix on coronal views. No definite mass, adenopathy, free fluid or inflammatory process. Diffuse osseous demineralization with old T12 compression fracture, 50% height loss, noted on prior lumbar MRI. Scattered degenerative disc and facet disease.  IMPRESSION: Post cholecystectomy with probable physiologic dilatation of the CBD. No focal pancreatic abnormalities. Old T12 compression fracture.  Original Report Authenticated By: Lollie Marrow, M.D.   Dg Abd Acute W/chest  10/30/2010  *RADIOLOGY REPORT*  Clinical Data: Abdominal pain  ACUTE ABDOMEN SERIES (ABDOMEN 2 VIEW & CHEST 1 VIEW)  Comparison: Chest radiograph dated 05/12/2009.  Findings: Lungs are clear. No pleural effusion or pneumothorax.  Cardiomediastinal silhouette is within normal limits.  Nonobstructive bowel gas pattern.  No evidence of free air under the diaphragm on the upright view.  Cholecystectomy clips.  Degenerative changes of the visualized thoracolumbar spine.  Prior posterior surgical fixation at L5 and S1.  Right hip arthroplasty.  IMPRESSION: No evidence of acute cardiopulmonary disease.  No evidence of small bowel obstruction or free air.  Postsurgical changes, as described above.  Original Report Authenticated By: Charline Bills, M.D.    Lab Results: Results for orders placed during the hospital encounter of 11/26/10 (from the past 48 hour(s))  GLUCOSE, CAPILLARY     Status: Abnormal   Collection Time   11/27/10 11:33 AM      Component Value Range Comment    Glucose-Capillary 106 (*) 70 - 99 (mg/dL)   GLUCOSE, CAPILLARY     Status: Abnormal   Collection Time   11/27/10  5:08 PM      Component Value Range Comment   Glucose-Capillary 107 (*) 70 - 99 (mg/dL)    Comment 1 Notify RN      Comment 2 Documented in Chart     GLUCOSE, CAPILLARY     Status: Abnormal   Collection Time   11/27/10  9:39 PM      Component Value Range Comment   Glucose-Capillary 151 (*) 70 - 99 (mg/dL)    Comment 1 Notify RN      Comment 2 Documented in Chart     GLUCOSE, CAPILLARY     Status: Abnormal   Collection Time   11/28/10  7:54 AM      Component Value Range Comment   Glucose-Capillary 113 (*) 70 - 99 (mg/dL)   GLUCOSE, CAPILLARY     Status: Abnormal   Collection Time   11/28/10 11:03 AM      Component Value Range Comment   Glucose-Capillary 105 (*) 70 - 99 (mg/dL)   GLUCOSE,  CAPILLARY     Status: Abnormal   Collection Time   11/28/10  4:32 PM      Component Value Range Comment   Glucose-Capillary 101 (*) 70 - 99 (mg/dL)   GLUCOSE, CAPILLARY     Status: Abnormal   Collection Time   11/28/10  9:31 PM      Component Value Range Comment   Glucose-Capillary 105 (*) 70 - 99 (mg/dL)   GLUCOSE, CAPILLARY     Status: Abnormal   Collection Time   11/29/10  7:17 AM      Component Value Range Comment   Glucose-Capillary 117 (*) 70 - 99 (mg/dL)    Recent Results (from the past 240 hour(s))  CLOSTRIDIUM DIFFICILE BY PCR     Status: Normal   Collection Time   11/27/10  8:59 AM      Component Value Range Status Comment   C difficile by pcr NEGATIVE  NEGATIVE  Final      Hospital Course: She was started on IV fluids given anti-emetics and improved. She had CT of the abdomen that did not show a gastrinoma.  Discharge Exam: Blood pressure 161/69, pulse 76, temperature 98.1 F (36.7 C), temperature source Oral, resp. rate 20, height 5\' 4"  (1.626 m), weight 70.761 kg (156 lb), SpO2 99.00%. Her abdomen was soft she looked much better she seemed to feel better and she  did not appear to be dehydrated  Disposition: Home. She was given instructions to try some Ativan for nausea if needed she was also given a prescription for domperidone by Dr. Karilyn Cota and was reminded that she needs to take Imodium if needed for diarrhea and that she needs to be taking her Prevacid twice a day every day  Discharge Orders    Future Appointments: Provider: Department: Dept Phone: Center:   01/06/2011 3:00 PM Malissa Hippo, MD [facility] December [phone] None        Signed: Quitman Norberto L 11/29/2010, 8:59 AM

## 2010-11-29 NOTE — Progress Notes (Signed)
Midline IV removed. Patient verbalizes understanding of discharge instructions and prescriptions. Patient transported by family.

## 2010-11-29 NOTE — Progress Notes (Signed)
Subjective: She is better. She has not had anymore nausea or vomiting. Dr. Dionicia Abler has suggested that she cut her Bentyl to twice a day and has ordered domperidone. She had not been taking her Prevacid twice a day and she's going to start that.  Objective: Vital signs in last 24 hours: Temp:  [98.1 F (36.7 C)-99.1 F (37.3 C)] 98.1 F (36.7 C) (08/31 0558) Pulse Rate:  [73-80] 76  (08/31 0558) Resp:  [20] 20  (08/31 0558) BP: (161-179)/(69-95) 161/69 mmHg (08/31 0558) SpO2:  [96 %-99 %] 99 % (08/31 0558) Weight change:  Last BM Date: 11/28/10  Intake/Output from previous day: 08/30 0701 - 08/31 0700 In: 5703 [P.O.:900; I.V.:4803] Out: 11 [Urine:10; Stool:1]  PHYSICAL EXAM General appearance: alert, cooperative and no distress Resp: clear to auscultation bilaterally Cardio: regular rate and rhythm, S1, S2 normal, no murmur, click, rub or gallop GI: soft, non-tender; bowel sounds normal; no masses,  no organomegaly Extremities: extremities normal, atraumatic, no cyanosis or edema  Lab Results:    Basic Metabolic Panel:  Basename 11/27/10 0500 11/26/10 1528  NA 130* 128*  K 3.7 3.4*  CL 100 97  CO2 18* 18*  GLUCOSE 108* 114*  BUN 7 6  CREATININE 0.54 0.50  CALCIUM 8.4 8.2*  MG -- --  PHOS -- --   Liver Function Tests:  [facility] 11/26/10 1528 11/26/10 0939  AST 18 19  ALT 14 17  ALKPHOS 65 74  BILITOT 0.3 0.4  PROT 6.0 6.6  ALBUMIN 3.4* 3.8    Basename 11/26/10 1528  LIPASE 44  AMYLASE --   No results found for this basename: AMMONIA:2 in the last 72 hours CBC:  Basename 11/27/10 0500 11/26/10 1528  WBC 7.4 7.5  NEUTROABS 4.8 5.6  HGB 11.9* 12.2  HCT 35.5* 35.5*  MCV 80.1 79.1  PLT 365 354   Cardiac Enzymes: No results found for this basename: CKTOTAL:3,CKMB:3,CKMBINDEX:3,TROPONINI:3 in the last 72 hours BNP: No results found for this basename: POCBNP:3 in the last 72 hours D-Dimer: No results found for this basename: DDIMER:2 in the last 72  hours CBG:  Basename 11/29/10 0717 11/28/10 2131 11/28/10 1632 11/28/10 1103 11/28/10 0754 11/27/10 2139  GLUCAP 117* 105* 101* 105* 113* 151*   Hemoglobin A1C: No results found for this basename: HGBA1C in the last 72 hours Fasting Lipid Panel: No results found for this basename: CHOL,HDL,LDLCALC,TRIG,CHOLHDL,LDLDIRECT in the last 72 hours Thyroid Function Tests: No results found for this basename: TSH,T4TOTAL,FREET4,T3FREE,THYROIDAB in the last 72 hours Anemia Panel: No results found for this basename: VITAMINB12,FOLATE,FERRITIN,TIBC,IRON,RETICCTPCT in the last 72 hours Urine Drug Screen:  Alcohol Level: No results found for this basename: ETH:2 in the last 72 hours Urinalysis:  Misc. Labs:  ABGS No results found for this basename: PHART,PCO2,PO2ART,TCO2,HCO3 in the last 72 hours CULTURES Recent Results (from the past 240 hour(s))  CLOSTRIDIUM DIFFICILE BY PCR     Status: Normal   Collection Time   11/27/10  8:59 AM      Component Value Range Status Comment   C difficile by pcr NEGATIVE  NEGATIVE  Final    Studies/Results: Ct Abdomen Pelvis W Wo Contrast  11/28/2010  *RADIOLOGY REPORT*  Clinical Data:  Nausea, vomiting, refractory peptic ulcer disease, elevated gastrin level, history diabetes  CT ABDOMEN AND PELVIS WITHOUT AND WITH CONTRAST  Technique:  Multidetector CT imaging of the abdomen and pelvis was performed without contrast material in one or both body regions, followed by contrast material(s) and further sections in one  or both body regions. Sagittal and coronal MPR images reconstructed from axial data set.  Contrast: Water as oral contrast. 100 ml Omnipaque 300 IV.  Comparison: 02/25/2005  Findings: Lung bases clear. Gallbladder surgically absent. CBD 9 mm diameter, prominent but may be physiologic post cholecystectomy. Tiny hiatal hernia questioned. Scattered atherosclerotic calcifications without aneurysm. Normal pancreatic enhancement. No evidence of pancreatic mass  or nodule identified. Tiny cyst inferior pole right kidney image 76. Liver, spleen, kidneys, and adrenal glands otherwise normal. Stomach and bowel loops normal appearance.  Scattered normal-sized upper abdominal lymph nodes, none pathologically enlarged. Beam hardening artifacts from orthopedic hardware lumbar spine and right hip prosthesis. Uterus surgically absent with nonvisualization ovaries. Questionable normal retrocecal appendix on coronal views. No definite mass, adenopathy, free fluid or inflammatory process. Diffuse osseous demineralization with old T12 compression fracture, 50% height loss, noted on prior lumbar MRI. Scattered degenerative disc and facet disease.  IMPRESSION: Post cholecystectomy with probable physiologic dilatation of the CBD. No focal pancreatic abnormalities. Old T12 compression fracture.  Original Report Authenticated By: Lollie Marrow, M.D.    Medications:  Prior to Admission:  Prescriptions prior to admission  Medication Sig Dispense Refill  . clobetasol (TEMOVATE) 0.05 % cream Apply topically 2 (two) times daily. TO LEGS       . cyclobenzaprine (FLEXERIL) 10 MG tablet Take 10 mg by mouth 2 (two) times daily as needed. For muscle spasms      . diltiazem (CARDIZEM CD) 120 MG 24 hr capsule Take 120 mg by mouth daily.        . fluocinonide (LIDEX) 0.05 % external solution Apply 1 application topically Daily. TO LEGS      . furosemide (LASIX) 40 MG tablet Take 40 mg by mouth daily.        Marland Kitchen gabapentin (NEURONTIN) 600 MG tablet Take 600 mg by mouth 3 (three) times daily as needed.       Marland Kitchen HYDROcodone-acetaminophen (NORCO) 5-325 MG per tablet Take 1 tablet by mouth 4 (four) times daily as needed. For pain      . lansoprazole (PREVACID) 30 MG capsule Take 30 mg by mouth daily.        Marland Kitchen levothyroxine (SYNTHROID, LEVOTHROID) 50 MCG tablet Take 50 mcg by mouth daily.        Marland Kitchen LORazepam (ATIVAN) 1 MG tablet Take 1 mg by mouth at bedtime. For sleep      . metFORMIN  (GLUCOPHAGE) 500 MG tablet Take 500 mg by mouth 2 (two) times daily with a meal. TAKE AFTERNOON DOSE IF NEEDED      . metoprolol (LOPRESSOR) 50 MG tablet Take 50 mg by mouth 2 (two) times daily.       . mupirocin (BACTROBAN) 2 % ointment Apply topically 2 (two) times daily.  60 g  5  . ondansetron (ZOFRAN) 4 MG tablet Take 4 mg by mouth every 8 (eight) hours as needed. For nausea       . potassium chloride SA (K-DUR,KLOR-CON) 20 MEQ tablet Take 2 tablets (40 mEq total) by mouth 2 (two) times daily.  120 tablet  12  . Propylene Glycol (SYSTANE BALANCE OP) Apply 2 drops to eye daily as needed. FOR DRY EYES        . DISCONTD: dicyclomine (BENTYL) 10 MG capsule Take 10-20 mg by mouth 4 (four) times daily -  before meals and at bedtime. 1-2 capsules before each meal      . DISCONTD: estradiol (ESTRACE) 0.5 MG tablet Take 0.5  mg by mouth daily.        Marland Kitchen diltiazem (CARDIZEM) 120 MG tablet Take 120 mg by mouth 2 (two) times daily.       . hydrOXYzine (ATARAX) 50 MG tablet Take 50 mg by mouth 3 (three) times daily as needed. For itching      . hydrOXYzine (VISTARIL) 50 MG capsule Take 1 tablet by mouth Once daily as needed. FOR ITCHING      . lisinopril (PRINIVIL,ZESTRIL) 10 MG tablet Take 10 mg by mouth daily.         Scheduled:   . clobetasol   Topical BID  . diltiazem  120 mg Oral Daily  . enoxaparin  40 mg Subcutaneous Q24H  . estradiol  0.5 mg Oral Daily  . insulin aspart  0-15 Units Subcutaneous TID WC  . insulin aspart  0-5 Units Subcutaneous QHS  . levothyroxine  50 mcg Oral Daily  . LORazepam  1 mg Oral QHS  . metoprolol  50 mg Oral BID  . mupirocin   Topical BID  . pantoprazole (PROTONIX) IV  40 mg Intravenous Q12H  . potassium chloride SA  40 mEq Oral BID  . sodium chloride      . sodium chloride      . DISCONTD: dicyclomine  10 mg Oral TID AC   Continuous:   . sodium chloride 125 mL/hr at 11/29/10 0501   ZOX:[id number], acetaminophen, HYDROcodone-acetaminophen,  HYDROmorphone, hydrOXYzine, iohexol, ondansetron (ZOFRAN) IV, ondansetron, promethazine, zolpidem  Assesment: She has improved greatly. She is ready for discharge. Principal Problem:  *Abdominal pain Active Problems:  HIGH BLOOD PRESSURE  MRSA infection, recurrent  Hypothyroidism  Fibromyalgia  Dehydration  Hypokalemia, gastrointestinal losses  Diabetes mellitus type 2 in obese  Peptic ulcer disease    Plan: She will be discharged home today please see discharge summary for details    LOS: 3 days   HAWKINS,EDWARD L 11/29/2010, 8:57 AM

## 2010-12-05 ENCOUNTER — Telehealth (INDEPENDENT_AMBULATORY_CARE_PROVIDER_SITE_OTHER): Payer: Self-pay | Admitting: *Deleted

## 2010-12-05 DIAGNOSIS — K279 Peptic ulcer, site unspecified, unspecified as acute or chronic, without hemorrhage or perforation: Secondary | ICD-10-CM

## 2010-12-05 DIAGNOSIS — R197 Diarrhea, unspecified: Secondary | ICD-10-CM

## 2010-12-05 NOTE — Telephone Encounter (Signed)
Lab noted for 3 months

## 2010-12-10 ENCOUNTER — Ambulatory Visit (INDEPENDENT_AMBULATORY_CARE_PROVIDER_SITE_OTHER): Payer: Medicare Other | Admitting: Internal Medicine

## 2010-12-10 NOTE — Progress Notes (Signed)
Encounter addended by: Karen Kays on: 12/10/2010 11:43 AM<BR>     Documentation filed: Flowsheet VN

## 2011-01-06 ENCOUNTER — Encounter (INDEPENDENT_AMBULATORY_CARE_PROVIDER_SITE_OTHER): Payer: Self-pay | Admitting: *Deleted

## 2011-01-06 ENCOUNTER — Encounter (INDEPENDENT_AMBULATORY_CARE_PROVIDER_SITE_OTHER): Payer: Self-pay | Admitting: Internal Medicine

## 2011-01-06 ENCOUNTER — Ambulatory Visit (INDEPENDENT_AMBULATORY_CARE_PROVIDER_SITE_OTHER): Payer: Medicare Other | Admitting: Internal Medicine

## 2011-01-06 ENCOUNTER — Other Ambulatory Visit (INDEPENDENT_AMBULATORY_CARE_PROVIDER_SITE_OTHER): Payer: Self-pay | Admitting: *Deleted

## 2011-01-06 VITALS — BP 108/70 | HR 68 | Temp 96.9°F | Resp 12 | Ht 64.0 in | Wt 159.0 lb

## 2011-01-06 DIAGNOSIS — K279 Peptic ulcer, site unspecified, unspecified as acute or chronic, without hemorrhage or perforation: Secondary | ICD-10-CM

## 2011-01-06 DIAGNOSIS — R112 Nausea with vomiting, unspecified: Secondary | ICD-10-CM

## 2011-01-06 DIAGNOSIS — D649 Anemia, unspecified: Secondary | ICD-10-CM

## 2011-01-06 NOTE — Patient Instructions (Addendum)
No changes made in GI meds today. EGD in 8 weeks. Blood work as ordered by Dr. Juanetta Gosling.

## 2011-01-06 NOTE — Progress Notes (Signed)
Presenting complaint; followup for nausea vomiting and peptic ulcer disease. Subjective; patient is 65 year old Caucasian female patient of Dr. Juanetta Gosling was ever scheduled visit. She was discharged from in [facility] on 11/29/2010 and ever since she's been feeling better. Her diarrhea has resolved since her metformin was discontinued; she has a good appetite. She said very few episodes of nausea since she left the hospital she is having no side effects with domperidone; she is to have CBC, metabolic 7 and  TSH level as ordered by Dr. Juanetta Gosling. Today she is experiencing abdominal cramps since she ate stew at [employer] yesterday. Current medications Current Outpatient Prescriptions  Medication Sig Dispense Refill  . clobetasol (TEMOVATE) 0.05 % cream Apply topically 2 (two) times daily. TO LEGS       . cyclobenzaprine (FLEXERIL) 10 MG tablet Take 10 mg by mouth as needed. For muscle spasms      . diltiazem (CARDIZEM) 120 MG tablet Take 120 mg by mouth 2 (two) times daily.       Marland Kitchen escitalopram (LEXAPRO) 10 MG tablet Take 10 mg by mouth as needed.        Marland Kitchen estradiol (ESTRACE) 0.5 MG tablet Take 1 tablet (0.5 mg total) by mouth daily.  30 tablet  5  . fluconazole (DIFLUCAN) 100 MG tablet Take 100 mg by mouth as needed.        . fluocinonide (LIDEX) 0.05 % external solution Apply 1 application topically Daily. TO LEGS      . furosemide (LASIX) 40 MG tablet Take 40 mg by mouth daily.        Marland Kitchen gabapentin (NEURONTIN) 600 MG tablet Take 600 mg by mouth 3 (three) times daily as needed.       Marland Kitchen HYDROcodone-acetaminophen (NORCO) 10-325 MG per tablet Take 1 tablet by mouth as needed.        . hydrOXYzine (VISTARIL) 50 MG capsule Take 1 tablet by mouth Once daily as needed. FOR ITCHING      . IRON-VITAMINS PO Take by mouth daily.        . lansoprazole (PREVACID) 30 MG capsule Take 30 mg by mouth 2 (two) times daily.       Marland Kitchen LORazepam (ATIVAN) 1 MG tablet Take 1 mg by mouth at bedtime. For sleep      .  metoprolol (LOPRESSOR) 50 MG tablet Take 50 mg by mouth 2 (two) times daily.        . Multiple Vitamins-Minerals (ONE-A-DAY ENERGY PO) Take by mouth daily.        . mupirocin (BACTROBAN) 2 % ointment Apply topically 2 (two) times daily.  60 g  5  . ondansetron (ZOFRAN) 4 MG tablet Take 4 mg by mouth every 8 (eight) hours as needed. For nausea       . potassium chloride SA (K-DUR,KLOR-CON) 20 MEQ tablet Take 2 tablets (40 mEq total) by mouth 2 (two) times daily.  120 tablet  12  . Propylene Glycol (SYSTANE BALANCE OP) Apply 2 drops to eye daily as needed. FOR DRY EYES        . sulfamethoxazole-trimethoprim (BACTRIM DS,SEPTRA DS) 800-160 MG per tablet Take 1 tablet by mouth 2 (two) times daily.        . valsartan (DIOVAN) 160 MG tablet Take 160 mg by mouth daily.        Marland Kitchen zolpidem (AMBIEN) 10 MG tablet Take 10 mg by mouth at bedtime as needed.         Objective BP 108/70  Pulse 68  Temp(Src) 96.9 F (36.1 C) (Oral)  Resp 12  Ht 5\' 4"  (1.626 m)  Wt 159 lb (72.122 kg)  BMI 27.29 kg/m2 Conjunctiva is pink. Sclerae nonicteric.Marland Kitchen Oropharyngeal mucosa is normal No neck masses or thyromegaly noted Abdomen is soft with mild midepigastric tenderness but no organomegaly or masses. No peripheral edema. She has multiple bruises over her forearms. Assessment; #1. PUD. She is doing well with double dose PPI and she is also on domperidone she may have an element of gastroparesis. Will change therapy once she is been documented to have healed ulcers. #2. Nausea vomiting secondary to above. Has resolved. #3. Anemia secondary to GI bleed. She'll have blood work later this month Plan Continue current therapy. Will review blood work with the patient when it is available EGD in 8 weeks.

## 2011-01-23 ENCOUNTER — Other Ambulatory Visit (INDEPENDENT_AMBULATORY_CARE_PROVIDER_SITE_OTHER): Payer: Self-pay | Admitting: *Deleted

## 2011-01-23 DIAGNOSIS — R112 Nausea with vomiting, unspecified: Secondary | ICD-10-CM

## 2011-01-23 DIAGNOSIS — K279 Peptic ulcer, site unspecified, unspecified as acute or chronic, without hemorrhage or perforation: Secondary | ICD-10-CM

## 2011-01-29 ENCOUNTER — Ambulatory Visit (HOSPITAL_COMMUNITY)
Admission: RE | Admit: 2011-01-29 | Discharge: 2011-01-29 | Disposition: A | Discharge status: Home / Self Care | Payer: Medicare Other | Source: Ambulatory Visit | Attending: Pulmonary Disease | Admitting: Pulmonary Disease

## 2011-01-29 ENCOUNTER — Other Ambulatory Visit (HOSPITAL_COMMUNITY): Payer: Self-pay | Admitting: Pulmonary Disease

## 2011-01-29 DIAGNOSIS — R531 Weakness: Secondary | ICD-10-CM

## 2011-01-29 DIAGNOSIS — M47812 Spondylosis without myelopathy or radiculopathy, cervical region: Secondary | ICD-10-CM | POA: Insufficient documentation

## 2011-01-29 DIAGNOSIS — M899 Disorder of bone, unspecified: Secondary | ICD-10-CM | POA: Insufficient documentation

## 2011-01-29 DIAGNOSIS — M949 Disorder of cartilage, unspecified: Secondary | ICD-10-CM | POA: Insufficient documentation

## 2011-01-29 DIAGNOSIS — Z139 Encounter for screening, unspecified: Secondary | ICD-10-CM

## 2011-01-29 DIAGNOSIS — M542 Cervicalgia: Secondary | ICD-10-CM | POA: Insufficient documentation

## 2011-02-03 ENCOUNTER — Ambulatory Visit (HOSPITAL_COMMUNITY): Payer: Medicare Other

## 2011-02-26 ENCOUNTER — Encounter (HOSPITAL_COMMUNITY): Payer: Self-pay | Admitting: Pharmacy Technician

## 2011-03-04 MED ORDER — SODIUM CHLORIDE 0.45 % IV SOLN
Freq: Once | INTRAVENOUS | Status: AC
  Administered 2011-03-05: 11:00:00 via INTRAVENOUS

## 2011-03-05 ENCOUNTER — Ambulatory Visit (HOSPITAL_COMMUNITY)
Admission: RE | Admit: 2011-03-05 | Discharge: 2011-03-05 | Disposition: A | Discharge status: Home / Self Care | Payer: Medicare Other | Source: Ambulatory Visit | Attending: Internal Medicine | Admitting: Internal Medicine

## 2011-03-05 ENCOUNTER — Encounter (HOSPITAL_COMMUNITY): Payer: Self-pay | Admitting: *Deleted

## 2011-03-05 ENCOUNTER — Encounter (HOSPITAL_COMMUNITY)
Admission: RE | Disposition: A | Discharge status: Home / Self Care | Payer: Self-pay | Source: Ambulatory Visit | Attending: Internal Medicine

## 2011-03-05 ENCOUNTER — Ambulatory Visit: Admit: 2011-03-05 | Payer: Self-pay | Admitting: Internal Medicine

## 2011-03-05 DIAGNOSIS — K208 Other esophagitis without bleeding: Secondary | ICD-10-CM

## 2011-03-05 DIAGNOSIS — K279 Peptic ulcer, site unspecified, unspecified as acute or chronic, without hemorrhage or perforation: Secondary | ICD-10-CM

## 2011-03-05 DIAGNOSIS — K277 Chronic peptic ulcer, site unspecified, without hemorrhage or perforation: Secondary | ICD-10-CM | POA: Insufficient documentation

## 2011-03-05 DIAGNOSIS — Z8719 Personal history of other diseases of the digestive system: Secondary | ICD-10-CM

## 2011-03-05 DIAGNOSIS — E7800 Pure hypercholesterolemia, unspecified: Secondary | ICD-10-CM | POA: Insufficient documentation

## 2011-03-05 DIAGNOSIS — I10 Essential (primary) hypertension: Secondary | ICD-10-CM | POA: Insufficient documentation

## 2011-03-05 DIAGNOSIS — Z79899 Other long term (current) drug therapy: Secondary | ICD-10-CM | POA: Insufficient documentation

## 2011-03-05 DIAGNOSIS — R112 Nausea with vomiting, unspecified: Secondary | ICD-10-CM

## 2011-03-05 DIAGNOSIS — E119 Type 2 diabetes mellitus without complications: Secondary | ICD-10-CM | POA: Insufficient documentation

## 2011-03-05 DIAGNOSIS — Z01812 Encounter for preprocedural laboratory examination: Secondary | ICD-10-CM | POA: Insufficient documentation

## 2011-03-05 HISTORY — PX: ESOPHAGOGASTRODUODENOSCOPY: SHX5428

## 2011-03-05 HISTORY — DX: Pure hypercholesterolemia, unspecified: E78.00

## 2011-03-05 HISTORY — DX: Anxiety disorder, unspecified: F41.9

## 2011-03-05 LAB — GLUCOSE, CAPILLARY: Glucose-Capillary: 121 mg/dL — ABNORMAL HIGH (ref 70–99)

## 2011-03-05 SURGERY — EGD (ESOPHAGOGASTRODUODENOSCOPY)
Anesthesia: Moderate Sedation

## 2011-03-05 MED ORDER — MEPERIDINE HCL 50 MG/ML IJ SOLN
INTRAMUSCULAR | Status: AC
  Filled 2011-03-05: qty 1

## 2011-03-05 MED ORDER — MEPERIDINE HCL 25 MG/ML IJ SOLN
INTRAMUSCULAR | Status: DC | PRN
  Administered 2011-03-05 (×2): 25 mg via INTRAVENOUS

## 2011-03-05 MED ORDER — STERILE WATER FOR IRRIGATION IR SOLN
Status: DC | PRN
  Administered 2011-03-05: 11:00:00

## 2011-03-05 MED ORDER — MIDAZOLAM HCL 5 MG/5ML IJ SOLN
INTRAMUSCULAR | Status: AC
  Filled 2011-03-05: qty 10

## 2011-03-05 MED ORDER — MIDAZOLAM HCL 5 MG/5ML IJ SOLN
INTRAMUSCULAR | Status: DC | PRN
  Administered 2011-03-05 (×4): 2 mg via INTRAVENOUS

## 2011-03-05 MED ORDER — BUTAMBEN-TETRACAINE-BENZOCAINE 2-2-14 % EX AERO
INHALATION_SPRAY | CUTANEOUS | Status: DC | PRN
  Administered 2011-03-05: 2 via TOPICAL

## 2011-03-05 NOTE — Op Note (Signed)
EGD PROCEDURE REPORT  PATIENT:  Rebecca Hartman  MR#:  [id number] Birthdate:  08/02/1945, 65 y.o., female Endoscopist:  Dr. Malissa Hippo, MD Referred By:  Dr. Oneal Deputy. Juanetta Gosling, MD Procedure Date: 03/05/2011  Procedure:   EGD  Indications:  Patient is 65 year old Caucasian female with complicated history of peptic ulcer disease who was last endoscoped 10-31-2010 and found to have pyloric channel as well as bulbar ulcer. She has undergone EGDs in the past but never documented to have completely healed ulcers. She is presently asymptomatic and undergoing EGD to document healing of these ulcers. Consider pyloric channel dilation if indicated            Informed Consent:  The risks, benefits, alternatives & imponderables which include, but are not limited to, bleeding, infection, perforation, drug reaction and potential missed lesion have been reviewed.  The potential for biopsy, lesion removal, esophageal dilation, etc. have also been discussed.  Questions have been answered.  All parties agreeable.  Please see history & physical in medical record for more information.  Medications:  Demerol 50 mg IV Versed 8 mg IV Cetacaine spray topically for oropharyngeal anesthesia  Description of procedure:  The endoscope was introduced through the mouth and advanced to the second portion of the duodenum without difficulty or limitations. The mucosal surfaces were surveyed very carefully during advancement of the scope and upon withdrawal.  Findings:  Esophagus:  Mucosa of the esophagus was normal. GE junction was wavy. Previously identified ulcers had completely healed. GEJ:  35 cm Hiatus:  37 cm Stomach:  Stomach was empty and distended very well with insufflation. Folds in the proximal stomach were unremarkable. Examination of mucosa at body and antrum was normal. Pyloric channel was deformed but patent. Otic channel ulcer completely healed. Some erythema noted to mucosa at pyloric channel. Angularis  fundus and cardia were examined by retroflexing the scope and were normal.  Duodenum:  Bulbar  mucosa was normal. Distal bulbar ulcer had completely healed. Post bulbar mucosa and folds were normal.  Therapeutic/Diagnostic Maneuvers Performed:  None  Complications:  None  Impression: Healed esophagitis. Small sliding hiatal hernia. Pyloric channel and bulbar ulcers have completely healed. Deformed but patent pylorus with  Erythema.  Recommendations:  Can decrease lansoprazole to 30 mg by mouth every morning. Once again patient advised not to take NSAIDs. Office visit in 6 months.  REHMAN,NAJEEB U  03/05/2011  11:36 AM  CC: Dr. Fredirick Maudlin, MD & Dr. Bonnetta Barry ref. provider found

## 2011-03-05 NOTE — H&P (Signed)
Rebecca Hartman is an 65 y.o. female.   Chief Complaint: Patient is here for EGD. HPI: Patient is 65 year old Caucasian female with complicated history of gastroduodenal ulceration with bleeding is less pyloric stenosis who is here for EGD to document healing of ulcers. She has not experienced hematemesis or melena since her last hospitalization over 3 months ago to. She has not lost any more weight since she left the hospital. She denies epigastric pain or dysphagia. He believes her diarrhea secondary to metformin and she will talk with Dr. Juanetta Gosling so that she could come off this medication.  Past Medical History  Diagnosis Date  . Hypertension   . Diabetes mellitus   . Hypothyroidism   . Fibromyalgia   . Chronic back pain   . Anemia   . Nausea & vomiting   . Hiatal hernia   . Anxiety   . Hypercholesteremia     Past Surgical History  Procedure Date  . Tonsillectomy   . Cholecystectomy   . Carpal tunnel release   . Back surgery   . Total hip revision   . Abdominal hysterectomy   . Esophagogastroduodenoscopy 10/31/2010    Procedure: ESOPHAGOGASTRODUODENOSCOPY (EGD);  Surgeon: Malissa Hippo, MD;  Location: [facility];  Service: Endoscopy;  Laterality: N/A;  . Colonoscopy 08/06/06 FIELDS  . Upper gastrointestinal endoscopy 10/03/2010    EGD DILATN PYLORIC CHANNEL  . Upper gastrointestinal endoscopy 07/08/2010  . Upper gastrointestinal endoscopy 08/06/06    FIELDS  . Upper gastrointestinal endoscopy 06/29/2008    EGD    Family History  Problem Relation Age of Onset  . Diabetes Mother   . Stroke Mother   . Stroke Father   . Healthy Son    Social History:  reports that she has never smoked. She has never used smokeless tobacco. She reports that she does not drink alcohol or use illicit drugs.  Allergies:  Allergies  Allergen Reactions  . Shellfish Allergy Itching and Nausea And Vomiting  . Glyburide     Sudden Drop in Blood Sugars  . Levaquin Itching  . Lisinopril  Other (See Comments)    SEVERE DROP IN PRESSURE  . Morphine Itching and Nausea Only  . Pantoprazole Sodium (Protonix) Nausea And Vomiting    PATIENT IS UNSURE OF REACTION    Medications Prior to Admission  Medication Dose Route Frequency Provider Last Rate Last Dose  . 0.45 % sodium chloride infusion   Intravenous Once Malissa Hippo, MD 20 mL/hr at 03/05/11 1101    . meperidine (DEMEROL) 50 MG/ML injection           . midazolam (VERSED) 5 MG/5ML injection            Medications Prior to Admission  Medication Sig Dispense Refill  . clobetasol (TEMOVATE) 0.05 % cream Apply topically 2 (two) times daily. TO LEGS       . diltiazem (CARDIZEM) 120 MG tablet Take 120 mg by mouth 2 (two) times daily.       Marland Kitchen escitalopram (LEXAPRO) 10 MG tablet Take 10 mg by mouth as needed. For depression      . estradiol (ESTRACE) 0.5 MG tablet Take 1 tablet (0.5 mg total) by mouth daily.  30 tablet  5  . fluocinonide (LIDEX) 0.05 % external solution Apply 1 application topically Daily. TO LEGS      . furosemide (LASIX) 40 MG tablet Take 40 mg by mouth daily.        Marland Kitchen gabapentin (NEURONTIN) 600  MG tablet Take 600 mg by mouth 3 (three) times daily.       Marland Kitchen HYDROcodone-acetaminophen (NORCO) 10-325 MG per tablet Take 1 tablet by mouth every 6 (six) hours as needed. For pain      . hydrOXYzine (VISTARIL) 50 MG capsule Take 50 mg by mouth Once daily as needed. FOR ITCHING      . IRON-VITAMINS PO Take by mouth daily.        . lansoprazole (PREVACID) 30 MG capsule Take 30 mg by mouth 2 (two) times daily.       Marland Kitchen levothyroxine (SYNTHROID, LEVOTHROID) 50 MCG tablet Take 50 mcg by mouth daily.        Marland Kitchen LORazepam (ATIVAN) 1 MG tablet Take 1 mg by mouth at bedtime. For sleep      . metFORMIN (GLUCOPHAGE) 500 MG tablet Take 500 mg by mouth daily with breakfast.        . metoprolol (LOPRESSOR) 50 MG tablet Take 50 mg by mouth 2 (two) times daily.        . Multiple Vitamins-Minerals (ONE-A-DAY ENERGY PO) Take 1 tablet by  mouth daily.       . mupirocin (BACTROBAN) 2 % ointment Apply topically 2 (two) times daily.  60 g  5  . ondansetron (ZOFRAN) 4 MG tablet Take 4 mg by mouth every 8 (eight) hours as needed. For nausea       . potassium chloride SA (K-DUR,KLOR-CON) 20 MEQ tablet Take 2 tablets (40 mEq total) by mouth 2 (two) times daily.  120 tablet  12  . Propylene Glycol (SYSTANE BALANCE OP) Apply 2 drops to eye daily as needed. FOR DRY EYES        . sulfamethoxazole-trimethoprim (BACTRIM DS,SEPTRA DS) 800-160 MG per tablet Take 1 tablet by mouth 2 (two) times daily as needed. Patient takes when the spots on her legs start looking bad. Otherwise, she does not take this      . valsartan (DIOVAN) 160 MG tablet Take 160 mg by mouth daily.        Marland Kitchen zolpidem (AMBIEN) 10 MG tablet Take 10 mg by mouth at bedtime as needed. For sleep      . cyclobenzaprine (FLEXERIL) 10 MG tablet Take 10 mg by mouth as needed. For muscle spasms      . fluconazole (DIFLUCAN) 100 MG tablet Take 100 mg by mouth as needed. For yeast        Results for orders placed during the hospital encounter of 03/05/11 (from the past 48 hour(s))  GLUCOSE, CAPILLARY     Status: Abnormal   Collection Time   03/05/11 10:59 AM      Component Value Range Comment   Glucose-Capillary 121 (*) 70 - 99 (mg/dL)    Comment 1 Documented in Chart      No results found.  Review of Systems  Gastrointestinal: Negative for abdominal pain, constipation, blood in stool and melena. Diarrhea: diarrhea secondary to metformin.    Blood pressure 133/70, pulse 65, temperature 98.7 F (37.1 C), temperature source Oral, resp. rate 18, height 5' 4.5" (1.638 m), weight 157 lb (71.215 kg), SpO2 96.00%. Physical Exam  Constitutional: She appears well-developed and well-nourished.  HENT:  Mouth/Throat: Oropharynx is clear and moist.  Eyes: Conjunctivae are normal. No scleral icterus.  Neck: No thyromegaly present.  Cardiovascular: Normal rate, regular rhythm and normal  heart sounds.   No murmur heard. Respiratory: Effort normal and breath sounds normal.  GI: Soft. She exhibits no distension  and no mass. There is Tenderness: mild midepigastric tenderness..  Musculoskeletal: She exhibits no edema.       Multiple  echymosis involving both forearms and hands.  Lymphadenopathy:    She has no cervical adenopathy.  Neurological: She is alert.  Skin: Skin is warm and dry.     Assessment/Plan History of complicated peptic ulcer disease. EGD to document healing of PUD and possible pyloroduodenal dilation.  Katlin Bortner U 03/05/2011, 11:16 AM

## 2011-03-13 ENCOUNTER — Encounter (INDEPENDENT_AMBULATORY_CARE_PROVIDER_SITE_OTHER): Payer: Self-pay | Admitting: *Deleted

## 2011-03-18 ENCOUNTER — Encounter (HOSPITAL_COMMUNITY): Payer: Self-pay | Admitting: Internal Medicine

## 2011-03-18 ENCOUNTER — Telehealth (INDEPENDENT_AMBULATORY_CARE_PROVIDER_SITE_OTHER): Payer: Self-pay | Admitting: *Deleted

## 2011-03-18 NOTE — Telephone Encounter (Signed)
Would like for Tammy to please return her call at [phone]. She received a letter to have lab work done and she was not aware of this.

## 2011-03-19 NOTE — Telephone Encounter (Signed)
Patient called and made aware that she was to have a Gastrin Level drawn. That after the first one Dr. Karilyn Cota had requested that we do another one 3 months from that time. The lab request was faxed to [facility].

## 2011-03-21 ENCOUNTER — Other Ambulatory Visit (INDEPENDENT_AMBULATORY_CARE_PROVIDER_SITE_OTHER): Payer: Self-pay | Admitting: Internal Medicine

## 2011-03-26 LAB — GASTRIN: Gastrin: 239 pg/mL — ABNORMAL HIGH (ref <101)

## 2011-04-02 ENCOUNTER — Telehealth (INDEPENDENT_AMBULATORY_CARE_PROVIDER_SITE_OTHER): Payer: Self-pay | Admitting: *Deleted

## 2011-04-02 DIAGNOSIS — K279 Peptic ulcer, site unspecified, unspecified as acute or chronic, without hemorrhage or perforation: Secondary | ICD-10-CM

## 2011-04-02 DIAGNOSIS — K219 Gastro-esophageal reflux disease without esophagitis: Secondary | ICD-10-CM

## 2011-04-02 NOTE — Telephone Encounter (Signed)
Per NUR the patient will need a fasting Gastrin in 4 weeks, lab noted and faxed to [facility]

## 2011-04-07 ENCOUNTER — Telehealth (INDEPENDENT_AMBULATORY_CARE_PROVIDER_SITE_OTHER): Payer: Self-pay | Admitting: *Deleted

## 2011-04-07 NOTE — Telephone Encounter (Signed)
LM asking for Rebecca Hartman to please give her a call. The return phone number is [phone].

## 2011-04-30 NOTE — Telephone Encounter (Signed)
Patient has questions about the Domperidone, it was explained that we are reviewing paper work and trying to review what needs to be done for this. She will be made aware when a decision is made.

## 2011-06-10 ENCOUNTER — Telehealth (INDEPENDENT_AMBULATORY_CARE_PROVIDER_SITE_OTHER): Payer: Self-pay | Admitting: Internal Medicine

## 2011-06-10 NOTE — Telephone Encounter (Signed)
Patient states she's been having nonbloody diarrhea. She denies rectal bleeding or fever. Patient advised to go back on dicyclomine 10 mg 2 or 3 times a day. She is due for repeat fasting gastrin level and should go to the lab later this week. Paper work has been sent to the lab by Ms. Todd.

## 2011-06-13 LAB — HEMOGLOBIN A1C: Hemoglobin A1C: 5.9

## 2011-06-16 ENCOUNTER — Other Ambulatory Visit (INDEPENDENT_AMBULATORY_CARE_PROVIDER_SITE_OTHER): Payer: Self-pay | Admitting: Internal Medicine

## 2011-08-19 ENCOUNTER — Ambulatory Visit (INDEPENDENT_AMBULATORY_CARE_PROVIDER_SITE_OTHER): Payer: Medicare Other | Admitting: Internal Medicine

## 2011-09-30 ENCOUNTER — Encounter (INDEPENDENT_AMBULATORY_CARE_PROVIDER_SITE_OTHER): Payer: Self-pay | Admitting: Internal Medicine

## 2011-09-30 ENCOUNTER — Ambulatory Visit (INDEPENDENT_AMBULATORY_CARE_PROVIDER_SITE_OTHER): Payer: Medicare Other | Admitting: Internal Medicine

## 2011-09-30 VITALS — BP 116/70 | HR 74 | Temp 97.4°F | Resp 20 | Ht 64.0 in | Wt 177.9 lb

## 2011-09-30 DIAGNOSIS — Z8711 Personal history of peptic ulcer disease: Secondary | ICD-10-CM

## 2011-09-30 DIAGNOSIS — Z8719 Personal history of other diseases of the digestive system: Secondary | ICD-10-CM

## 2011-09-30 DIAGNOSIS — R110 Nausea: Secondary | ICD-10-CM

## 2011-09-30 MED ORDER — LANSOPRAZOLE 30 MG PO CPDR: 30.0000 mg | DELAYED_RELEASE_CAPSULE | Freq: Every day | ORAL | Status: DC

## 2011-09-30 NOTE — Patient Instructions (Signed)
Go back on lansoprazole 30 mg by mouth every morning 30 minutes before breakfast. Progress report in 2-3 weeks guiding nausea. Gastrin level  to be checked in September 2013

## 2011-09-30 NOTE — Progress Notes (Signed)
Presenting complaint;  Follow for peptic ulcer disease and GERD. Patient complains of nausea.  Subjective:  Patient is a 66 year old Caucasian female who is here for scheduled visit. She was last seen in December 2012 and she had esophagogastroduodenoscopy and documented to have healed esophagitis and duodenal ulcer. She has not taken NSAIDs anymore. She is on Oxycodone for pain control. Today she complains of intermittent nausea which occurs at least twice a week and she has sporadic vomiting. She had an episode 6-8 weeks ago. Her appetite is very good. She has heartburn once or twice a month relieved with OTC antacids. She has occasional diarrhea also with constipation but she denies melena or rectal bleeding. She is not taking lansoprazole. She does not remember when she stopped it.  Current Medications: Current Outpatient Prescriptions  Medication Sig Dispense Refill  . clobetasol (TEMOVATE) 0.05 % cream Apply topically 2 (two) times daily. TO LEGS       . cyclobenzaprine (FLEXERIL) 10 MG tablet Take 10 mg by mouth as needed. For muscle spasms      . diltiazem (CARDIZEM) 120 MG tablet Take 120 mg by mouth 2 (two) times daily.       Marland Kitchen estradiol (ESTRACE) 0.5 MG tablet Take 1 tablet (0.5 mg total) by mouth daily.  30 tablet  5  . fluconazole (DIFLUCAN) 100 MG tablet Take 100 mg by mouth as needed. For yeast      . furosemide (LASIX) 40 MG tablet Take 40 mg by mouth daily.        Marland Kitchen gabapentin (NEURONTIN) 600 MG tablet Take 600 mg by mouth 3 (three) times daily.       . hydrOXYzine (VISTARIL) 50 MG capsule Take 50 mg by mouth Once daily as needed. FOR ITCHING      . IRON-VITAMINS PO Take by mouth daily.        . lansoprazole (PREVACID) 30 MG capsule Take 30 mg by mouth 2 (two) times daily.       Marland Kitchen levothyroxine (SYNTHROID, LEVOTHROID) 50 MCG tablet Take 50 mcg by mouth daily.        Marland Kitchen LORazepam (ATIVAN) 1 MG tablet Take 1 mg by mouth at bedtime. For sleep      . metoprolol (LOPRESSOR) 50 MG  tablet Take 50 mg by mouth daily.       . Multiple Vitamins-Minerals (ONE-A-DAY ENERGY PO) Take 1 tablet by mouth daily.       . mupirocin (BACTROBAN) 2 % ointment Apply topically 2 (two) times daily.  60 g  5  . ondansetron (ZOFRAN) 4 MG tablet Take 4 mg by mouth every 8 (eight) hours as needed. For nausea       . Oxycodone HCl 10 MG TABS 10 mg daily.       Marland Kitchen Propylene Glycol (SYSTANE BALANCE OP) Apply 2 drops to eye daily as needed. FOR DRY EYES        . sulfamethoxazole-trimethoprim (BACTRIM DS,SEPTRA DS) 800-160 MG per tablet Take 1 tablet by mouth 2 (two) times daily as needed. Patient takes when the spots on her legs start looking bad. Otherwise, she does not take this      . valsartan (DIOVAN) 160 MG tablet Take 160 mg by mouth daily.        Marland Kitchen zolpidem (AMBIEN) 10 MG tablet Take 10 mg by mouth at bedtime as needed. For sleep         Objective: Blood pressure 116/70, pulse 74, temperature 97.4 F (36.3 C), temperature  source Oral, resp. rate 20, height 5\' 4"  (1.626 m), weight 177 lb 14.4 oz (80.695 kg). Patient is alert and in NAD Conjunctiva is pink. Sclera is nonicteric Oropharyngeal mucosa is normal. No neck masses or thyromegaly noted. Cardiac exam with regular rhythm normal S1 and S2. No murmur or gallop noted. Lungs are clear to auscultation. Abdomen is full. Bowel sounds are normal. Abdomen is soft. Mild tenderness in LLQ and epigastric region. No organomegaly or masses. No LE edema or clubbing noted. Multiple ecchymosis noted over her forearms.  Labs/studies Results: Gastrin level on 06/16/2011 was 157; it was 239 in December 2012.   Assessment:  #1. Intermittent nausea possibly related to GERD; she could also have an element of gastroparesis or pyloric stenosis. If she does not get better with PPI will consider further evaluation. #2. Negative history of peptic ulcer disease with GI bleed in the past. She needs to be on PPI chronically. #3. Irritable bowel  syndrome. #4. Elevated serum gastrin levels secondary to chronic PPI therapy.   Plan:  Go back on lansoprazole 30 mg by mouth every morning. Prescription renewed. Fasting gastrin level in September 2013. Progress report in 2-3 weeks regarding nausea. Office visit in in January 2014.

## 2012-02-12 ENCOUNTER — Encounter (HOSPITAL_COMMUNITY): Payer: Self-pay | Admitting: Dietician

## 2012-02-12 NOTE — Progress Notes (Signed)
[facility] Diabetes Class Completion  Date:February 12, 2012  Time: 6:30 PM  Pt attended [facility]'s Diabetes Group Education Class on February 12, 2012.   Patient was educated on the following topics: survival skills (signs and symptoms of hyperglycemia and hypoglycemia, treatment for hypoglycemia, ideal levels for fasting and postprandial blood sugars, goal Hgb A1c level, foot care basics), recommendations for physical activity, carbohydrate metabolism in relation to diabetes, and meal planning (sources of carbohydrate, carbohydrate counting, meal planning strategies, food label reading, and portion control).   Debrina Kizer A. Kayan, RD, LDN   

## 2012-03-22 ENCOUNTER — Encounter: Payer: Self-pay | Admitting: Family Medicine

## 2012-04-05 ENCOUNTER — Encounter (INDEPENDENT_AMBULATORY_CARE_PROVIDER_SITE_OTHER): Payer: Self-pay | Admitting: Internal Medicine

## 2012-04-05 ENCOUNTER — Ambulatory Visit (INDEPENDENT_AMBULATORY_CARE_PROVIDER_SITE_OTHER): Payer: PRIVATE HEALTH INSURANCE | Admitting: Internal Medicine

## 2012-04-05 VITALS — BP 118/70 | HR 76 | Temp 98.1°F | Resp 20 | Ht 64.0 in | Wt 180.9 lb

## 2012-04-05 DIAGNOSIS — K279 Peptic ulcer, site unspecified, unspecified as acute or chronic, without hemorrhage or perforation: Secondary | ICD-10-CM

## 2012-04-05 MED ORDER — PANTOPRAZOLE SODIUM 40 MG PO TBEC: 40.0000 mg | DELAYED_RELEASE_TABLET | Freq: Every day | ORAL | Status: DC

## 2012-04-05 NOTE — Progress Notes (Signed)
Presenting complaint;  Epigastric discomfort bloating and constant need to burp.  Subjective:  Patient is 67 year old Caucasian female who has complicated history of peptic ulcer disease and GERD who was doing well when last seen in July 2013 other than nausea. Now she returns with 8-12 week history of [place] had and need to burp but she is unable to and finally when she does she feels better. She states her symptoms been gradually getting worse. She also complains of abdominal discomfort which she describes as pressure at epigastrium. She has not experienced any vomiting. Recently she had 3 episodes of hematochezia with diarrhea. She denies melena or frank bleeding. She denies using NSAIDs. She was begun on victoza in November last year but she states her symptoms started before she took this medication.  She also reports that gabapentin dose has been increased she does not believe she is having side effects. Despite her symptoms her weight is up by 4 pounds since her last visit. Patient's last EGD was in December 2012 when she was noted to have healed esophagitis healed peptic ulcer disease, deformed but patent pylorus. Current Medications:   Current Outpatient Prescriptions  Medication Sig Dispense Refill  . BOOSTRIX 5-2.5-18.5 injection       . ciprofloxacin (CIPRO) 250 MG tablet 250 mg 2 (two) times daily.       . clobetasol (TEMOVATE) 0.05 % cream Apply topically 2 (two) times daily. TO LEGS       . cyclobenzaprine (FLEXERIL) 10 MG tablet Take 10 mg by mouth as needed. For muscle spasms      . diltiazem (CARDIZEM) 120 MG tablet Take 120 mg by mouth 2 (two) times daily.       . diphenoxylate-atropine (LOMOTIL) 2.5-0.025 MG per tablet Take 1 tablet by mouth as needed.      Marland Kitchen escitalopram (LEXAPRO) 10 MG tablet Take 10 mg by mouth as needed.      . fluconazole (DIFLUCAN) 100 MG tablet Take 100 mg by mouth as needed. For yeast      . furosemide (LASIX) 40 MG tablet Take 40 mg by mouth daily.         Marland Kitchen gabapentin (NEURONTIN) 600 MG tablet Take 600 mg by mouth 3 (three) times daily.       Marland Kitchen HYDROcodone-acetaminophen (VICODIN) 5-500 MG per tablet Take 1 tablet by mouth as needed.      . hydrOXYzine (VISTARIL) 50 MG capsule Take 50 mg by mouth Once daily as needed. FOR ITCHING      . lansoprazole (PREVACID) 30 MG capsule Take 1 capsule (30 mg total) by mouth daily.  30 capsule  11  . levothyroxine (SYNTHROID, LEVOTHROID) 50 MCG tablet Take 50 mcg by mouth daily.        Marland Kitchen loperamide (IMODIUM) 2 MG capsule Take 2 mg by mouth as needed.      Marland Kitchen LORazepam (ATIVAN) 1 MG tablet Take 1 mg by mouth at bedtime. For sleep      . metoprolol (LOPRESSOR) 50 MG tablet Take 50 mg by mouth daily.       . Multiple Vitamins-Minerals (ONE-A-DAY ENERGY PO) Take 1 tablet by mouth daily.       . ondansetron (ZOFRAN) 4 MG tablet Take 4 mg by mouth every 8 (eight) hours as needed. For nausea       . Oxycodone HCl 10 MG TABS 10 mg daily.       Marland Kitchen Propylene Glycol (SYSTANE BALANCE OP) Apply 2 drops to eye daily  as needed. FOR DRY EYES        . pseudoephedrine-guaifenesin (MUCINEX D) 60-600 MG per tablet Take 1 tablet by mouth as needed.      . sulfamethoxazole-trimethoprim (BACTRIM DS,SEPTRA DS) 800-160 MG per tablet Take 1 tablet by mouth 2 (two) times daily as needed. Patient takes when the spots on her legs start looking bad. Otherwise, she does not take this      . terconazole (TERAZOL 7) 0.4 % vaginal cream Place 1 applicator vaginally at bedtime.       . valsartan (DIOVAN) 160 MG tablet Take 160 mg by mouth daily.        Marland Kitchen VICTOZA 18 MG/3ML SOLN daily. Patient inject 1.8 mg daily      . zolpidem (AMBIEN) 10 MG tablet Take 10 mg by mouth at bedtime as needed. For sleep      . estradiol (ESTRACE) 0.5 MG tablet Take 1 tablet (0.5 mg total) by mouth daily.  30 tablet  5    Objective: Blood pressure 118/70, pulse 76, temperature 98.1 F (36.7 C), temperature source Oral, resp. rate 20, height 5\' 4"  (1.626 m),  weight 180 lb 14.4 oz (82.056 kg). Conjunctiva is pink. Sclera is nonicteric Oropharyngeal mucosa is normal. No neck masses or thyromegaly noted. Cardiac exam with regular rhythm normal S1 and S2. No murmur or gallop noted. Lungs are clear to auscultation. Abdomen. Bowel sounds are normal. No succussion splash noted over epigastric region. Abdomen is soft with mild midepigastric tenderness. No organomegaly or masses the No LE edema or clubbing noted.  Labs/studies Results: Gastrin level on 06/16/2011 was 157(normal less than 101). Gastrin level was 239 in December 2012, 143 on 11/28/2010 and 165 on 11-01-2010.  Assessment:  #1. Recurrent dyspeptic symptoms in a patient with complicated history of peptic ulcer disease. She could have recurrent PUD or pyloric stenosis. Symptoms may also be secondary to Victoza. #2. Hematochezia most likely secondary to hemorrhoids.   Plan:  Discontinue lansoprazole. Start pantoprazole 40 mg by mouth twice a day. Would check gastrin level. If patient does not feel better within the next 3 days will consider diagnostic EGD.

## 2012-04-05 NOTE — Patient Instructions (Addendum)
Physician will contact you with results of blood work. Call office with progress report in 3 days.

## 2012-04-06 ENCOUNTER — Other Ambulatory Visit (INDEPENDENT_AMBULATORY_CARE_PROVIDER_SITE_OTHER): Payer: Self-pay | Admitting: Internal Medicine

## 2012-04-08 ENCOUNTER — Telehealth (INDEPENDENT_AMBULATORY_CARE_PROVIDER_SITE_OTHER): Payer: Self-pay | Admitting: *Deleted

## 2012-04-08 NOTE — Telephone Encounter (Signed)
Will call patient when gastrin level received.

## 2012-04-08 NOTE — Telephone Encounter (Signed)
[name] called and states that she is feeling better. The burping has kind of gone away. She did have some hoarseness but states that this did not last forever. She is taking the Pantoprazole 40 mg twice a day. Her Gastrin level is not back yet. I called [facility] and the representative states that it was drawn on 04-06-12 The test is pending it was set up today. Patient was advised that Dr.Rehman would be made aware of her progress report and she would be called with the results of her lab work. Currently the patient has an appointment for July 2014.

## 2012-04-14 NOTE — Progress Notes (Signed)
Report to Dr. Juanetta Gosling. EGD to be scheduled.

## 2012-04-15 ENCOUNTER — Encounter (INDEPENDENT_AMBULATORY_CARE_PROVIDER_SITE_OTHER): Payer: Self-pay | Admitting: *Deleted

## 2012-04-15 ENCOUNTER — Other Ambulatory Visit (INDEPENDENT_AMBULATORY_CARE_PROVIDER_SITE_OTHER): Payer: Self-pay | Admitting: *Deleted

## 2012-04-15 DIAGNOSIS — R140 Abdominal distension (gaseous): Secondary | ICD-10-CM

## 2012-04-19 ENCOUNTER — Encounter (HOSPITAL_COMMUNITY): Payer: Self-pay | Admitting: Pharmacy Technician

## 2012-04-28 ENCOUNTER — Ambulatory Visit (HOSPITAL_COMMUNITY)
Admission: RE | Admit: 2012-04-28 | Discharge: 2012-04-28 | Disposition: A | Discharge status: Home / Self Care | Payer: Medicare Other | Source: Ambulatory Visit | Attending: Internal Medicine | Admitting: Internal Medicine

## 2012-04-28 ENCOUNTER — Encounter (HOSPITAL_COMMUNITY): Payer: Self-pay | Admitting: *Deleted

## 2012-04-28 ENCOUNTER — Encounter (HOSPITAL_COMMUNITY)
Admission: RE | Disposition: A | Discharge status: Home / Self Care | Payer: Self-pay | Source: Ambulatory Visit | Attending: Internal Medicine

## 2012-04-28 DIAGNOSIS — K222 Esophageal obstruction: Secondary | ICD-10-CM

## 2012-04-28 DIAGNOSIS — I10 Essential (primary) hypertension: Secondary | ICD-10-CM | POA: Insufficient documentation

## 2012-04-28 DIAGNOSIS — Z8719 Personal history of other diseases of the digestive system: Secondary | ICD-10-CM

## 2012-04-28 DIAGNOSIS — E164 Increased secretion of gastrin: Secondary | ICD-10-CM

## 2012-04-28 DIAGNOSIS — R140 Abdominal distension (gaseous): Secondary | ICD-10-CM

## 2012-04-28 DIAGNOSIS — Z01812 Encounter for preprocedural laboratory examination: Secondary | ICD-10-CM | POA: Insufficient documentation

## 2012-04-28 DIAGNOSIS — R1013 Epigastric pain: Secondary | ICD-10-CM | POA: Insufficient documentation

## 2012-04-28 DIAGNOSIS — R1310 Dysphagia, unspecified: Secondary | ICD-10-CM | POA: Insufficient documentation

## 2012-04-28 DIAGNOSIS — E119 Type 2 diabetes mellitus without complications: Secondary | ICD-10-CM | POA: Insufficient documentation

## 2012-04-28 DIAGNOSIS — K267 Chronic duodenal ulcer without hemorrhage or perforation: Secondary | ICD-10-CM | POA: Insufficient documentation

## 2012-04-28 DIAGNOSIS — Z8711 Personal history of peptic ulcer disease: Secondary | ICD-10-CM

## 2012-04-28 DIAGNOSIS — K315 Obstruction of duodenum: Secondary | ICD-10-CM

## 2012-04-28 HISTORY — PX: ESOPHAGOGASTRODUODENOSCOPY: SHX5428

## 2012-04-28 LAB — GLUCOSE, CAPILLARY: Glucose-Capillary: 128 mg/dL — ABNORMAL HIGH (ref 70–99)

## 2012-04-28 SURGERY — EGD (ESOPHAGOGASTRODUODENOSCOPY)
Anesthesia: Moderate Sedation

## 2012-04-28 MED ORDER — MEPERIDINE HCL 50 MG/ML IJ SOLN
INTRAMUSCULAR | Status: AC
  Filled 2012-04-28: qty 1

## 2012-04-28 MED ORDER — BUTAMBEN-TETRACAINE-BENZOCAINE 2-2-14 % EX AERO
INHALATION_SPRAY | CUTANEOUS | Status: DC | PRN
  Administered 2012-04-28: 2 via TOPICAL

## 2012-04-28 MED ORDER — MIDAZOLAM HCL 5 MG/5ML IJ SOLN
INTRAMUSCULAR | Status: DC | PRN
  Administered 2012-04-28 (×3): 2 mg via INTRAVENOUS
  Administered 2012-04-28: 1 mg via INTRAVENOUS

## 2012-04-28 MED ORDER — STERILE WATER FOR IRRIGATION IR SOLN
Status: DC | PRN
  Administered 2012-04-28: 10:00:00

## 2012-04-28 MED ORDER — PANTOPRAZOLE SODIUM 40 MG PO TBEC
40.0000 mg | DELAYED_RELEASE_TABLET | Freq: Two times a day (BID) | ORAL | Status: DC
Start: 2012-04-28 — End: 2013-01-03

## 2012-04-28 MED ORDER — MIDAZOLAM HCL 5 MG/5ML IJ SOLN
INTRAMUSCULAR | Status: AC
  Filled 2012-04-28: qty 10

## 2012-04-28 MED ORDER — MEPERIDINE HCL 25 MG/ML IJ SOLN
INTRAMUSCULAR | Status: DC | PRN
  Administered 2012-04-28 (×2): 25 mg via INTRAVENOUS

## 2012-04-28 MED ORDER — SODIUM CHLORIDE 0.45 % IV SOLN
INTRAVENOUS | Status: DC
  Administered 2012-04-28: 10:00:00 via INTRAVENOUS

## 2012-04-28 NOTE — H&P (Signed)
Rebecca Hartman is an 67 y.o. female.   Chief Complaint: Patient is here for EGD possible ED and pyloric channel dilation. HPI: Patient is 67 year old Caucasian female with complicated history of peptic of the disease was noted to have healed esophagitis and peptic ulcer disease an EGD of 03/2011. She has remained on PPI. She was seen in the office 3 weeks ago with bloating epigastric pain and constant need to arch. She has history of mildly elevated serum gastrin levels. Gastric level was repeated was more than doubled to 580. Since her office visit she is a single episode of emesis. She believes she may have picked up a virus from her grandson. She denies hematemesis melena. She has had intermittent small volume hematochezia and she has diarrhea. She has noted intermittent dysphagia since her office visit  She reassures me that she does not take NSAIDs. Today she is also complaining of headache which she believes is sinus headache.  Past Medical History  Diagnosis Date  . Hypertension   . Diabetes mellitus   . Hypothyroidism   . Fibromyalgia   . Chronic back pain   . Anemia   . Nausea & vomiting   . Hiatal hernia   . Anxiety   . Hypercholesteremia     Past Surgical History  Procedure Date  . Tonsillectomy   . Cholecystectomy   . Carpal tunnel release   . Back surgery   . Total hip revision   . Abdominal hysterectomy   . Esophagogastroduodenoscopy 10/31/2010    Procedure: ESOPHAGOGASTRODUODENOSCOPY (EGD);  Surgeon: Malissa Hippo, MD;  Location: [facility];  Service: Endoscopy;  Laterality: N/A;  . Colonoscopy 08/06/06 FIELDS  . Upper gastrointestinal endoscopy 10/03/2010    EGD DILATN PYLORIC CHANNEL  . Upper gastrointestinal endoscopy 07/08/2010  . Upper gastrointestinal endoscopy 08/06/06    FIELDS  . Upper gastrointestinal endoscopy 06/29/2008    EGD  . Esophagogastroduodenoscopy 03/05/2011    Procedure: ESOPHAGOGASTRODUODENOSCOPY (EGD);  Surgeon: Malissa Hippo, MD;   Location: [facility];  Service: Endoscopy;  Laterality: N/A;  12:00    Family History  Problem Relation Age of Onset  . Diabetes Mother   . Stroke Mother   . Stroke Father   . Healthy Son    Social History:  reports that she has never smoked. She has never used smokeless tobacco. She reports that she does not drink alcohol or use illicit drugs.  Allergies:  Allergies  Allergen Reactions  . Shellfish Allergy Itching and Nausea And Vomiting  . Glyburide     Sudden Drop in Blood Sugars  . Levofloxacin Itching  . Lisinopril Other (See Comments)    SEVERE DROP IN PRESSURE  . Morphine Itching and Nausea Only  . Pantoprazole Sodium (Pantoprazole Sodium) Nausea And Vomiting    PATIENT IS UNSURE OF REACTION    Medications Prior to Admission  Medication Sig Dispense Refill  . clobetasol (TEMOVATE) 0.05 % cream Apply topically 2 (two) times daily. TO LEGS       . diltiazem (CARDIZEM) 120 MG tablet Take 120 mg by mouth 2 (two) times daily.       . fluconazole (DIFLUCAN) 100 MG tablet Take 100 mg by mouth as needed. For yeast      . gabapentin (NEURONTIN) 600 MG tablet Take 800 mg by mouth 3 (three) times daily.       Marland Kitchen HYDROcodone-acetaminophen (VICODIN) 5-500 MG per tablet Take 1 tablet by mouth as needed.      Marland Kitchen  hydrOXYzine (VISTARIL) 50 MG capsule Take 50 mg by mouth Once daily as needed. FOR ITCHING      . levothyroxine (SYNTHROID, LEVOTHROID) 50 MCG tablet Take 50 mcg by mouth daily.        Marland Kitchen loperamide (IMODIUM) 2 MG capsule Take 2 mg by mouth as needed.      Marland Kitchen LORazepam (ATIVAN) 1 MG tablet Take 1 mg by mouth at bedtime. For sleep      . metoprolol (LOPRESSOR) 50 MG tablet Take 50 mg by mouth daily.       . Multiple Vitamins-Minerals (ONE-A-DAY ENERGY PO) Take 1 tablet by mouth daily.       . ondansetron (ZOFRAN) 4 MG tablet Take 4 mg by mouth every 8 (eight) hours as needed. For nausea       . Oxycodone HCl 10 MG TABS 10 mg daily.       . pantoprazole (PROTONIX) 40 MG tablet  Take 1 tablet (40 mg total) by mouth daily.  60 tablet  5  . Propylene Glycol (SYSTANE BALANCE OP) Apply 2 drops to eye daily as needed. FOR DRY EYES        . pseudoephedrine-guaifenesin (MUCINEX D) 60-600 MG per tablet Take 1 tablet by mouth as needed.      . valsartan (DIOVAN) 160 MG tablet Take 160 mg by mouth daily.        Marland Kitchen zolpidem (AMBIEN) 10 MG tablet Take 10 mg by mouth at bedtime as needed. For sleep      . estradiol (ESTRACE) 0.5 MG tablet Take 1 tablet (0.5 mg total) by mouth daily.  30 tablet  5  . furosemide (LASIX) 40 MG tablet Take 40 mg by mouth daily as needed.       Marland Kitchen terconazole (TERAZOL 7) 0.4 % vaginal cream Place 1 applicator vaginally at bedtime.         Results for orders placed during the hospital encounter of 04/28/12 (from the past 48 hour(s))  GLUCOSE, CAPILLARY     Status: Abnormal   Collection Time   04/28/12  9:47 AM      Component Value Range Comment   Glucose-Capillary 128 (*) 70 - 99 mg/dL    No results found.  ROS  Blood pressure 146/75, pulse 88, temperature 97.9 F (36.6 C), temperature source Oral, resp. rate 17, height 5\' 4"  (1.626 m), weight 180 lb (81.647 kg), SpO2 94.00%. Physical Exam  Constitutional: She appears well-developed and well-nourished.  HENT:  Mouth/Throat: Oropharynx is clear and moist.  Eyes: Conjunctivae normal are normal. No scleral icterus.  Neck: No thyromegaly present.  Cardiovascular: Normal rate, regular rhythm and normal heart sounds.   Murmur: faint SEM at LLSB. Respiratory: Effort normal and breath sounds normal.  GI: Soft. She exhibits no distension and no mass. There is tenderness (mild midepigastric tenderness).  Musculoskeletal: She exhibits no edema.  Lymphadenopathy:    She has no cervical adenopathy.  Neurological: She is alert.  Skin: Skin is warm and dry.     Assessment/Plan Epigastric pain bloating and constant need to burp in a patient with complicated history of peptic ulcer disease. Solid food  dysphagia. Elevated serum gastrin level. Prescription be secondary to partial gastric outlet obstruction or gastrinoma. EGD with possible ED and pyloric channel dilation.  Rebecca Hartman U 04/28/2012, 10:08 AM

## 2012-04-28 NOTE — Op Note (Signed)
EGD PROCEDURE REPORT  PATIENT:  Rebecca Hartman  MR#:  [id number] Birthdate:  13-Aug-1945, 67 y.o., female Endoscopist:  Dr. Malissa Hippo, MD Referred By:  Dr. Bonnetta Barry ref. provider found Procedure Date: 04/28/2012  Procedure:   EGD with esophageal dilation(Maloney) and duodenal dilation(balloon).  Indications:  Patient is 67 year old Caucasian female with multiple medical problems including complicated history of esophagitis and peptic ulcer disease was last EGD was in December 2012 and she was noted to have healed esophagitis and healed peptic ulcer disease. She now presents with epigastric pain bloating constant need to burp also complains of dysphagia. She has history of mildly elevated serum gastrin it has doubled recently to 580. Os CT was in August 2012 and was unremarkable. Today she is also complaining of intermittent solid food dysphagia.            Informed Consent:  The risks, benefits, alternatives & imponderables which include, but are not limited to, bleeding, infection, perforation, drug reaction and potential missed lesion have been reviewed.  The potential for biopsy, lesion removal, esophageal dilation, etc. have also been discussed.  Questions have been answered.  All parties agreeable.  Please see history & physical in medical record for more information.  Medications:  Demerol 50 mg IV Versed   7 mg IV Cetacaine spray topically for oropharyngeal anesthesia  Description of procedure:  The endoscope was introduced through the mouth and advanced to the second portion of the duodenum without difficulty or limitations. The mucosal surfaces were surveyed very carefully during advancement of the scope and upon withdrawal.  Findings:  Esophagus:  Mucosa of the esophagus was normal. Noncritical ring noted at GE junction. GEJ:  34 cm Hiatus:  37 cm Stomach:  Small amount of food debris noted in the stomach. It distended very well with insufflation. Small antral scar noted. Pyloric  channel was patent but mucosa was friable. Angularis fundus and cardia were examined by retroflexing the scope and were normal. Duodenum:  Multiple food particles noted in the bulb along with stricture at end of duodenum with circumferential ulcer. I was able to pass the scope across this area and the second part of the duodenum.  Therapeutic/Diagnostic Maneuvers Performed:   Duodenal stricture was dilated with a balloon. Guidewire was pushed to the second part of the duodenum. Balloon dilator was positioned across the stricture and insufflated to a diameter of 12, 13.5 and finally to 15 mm. Ring this process pyloric channel was also dilated. Less resistance noted as the scope was passed across duodenal stricture. Endoscope was withdrawn. Esophagus was dilated by passing 54 French Maloney dilator but no mucosal disruption noted.  Complications:  None   Impression: Noncritical ring at GE junction and a small sliding hiatal hernia. Food debris noted in the stomach and duodenal bulb. Pyloric channel inflammation but pyloric channel was patent. Stricture at duodenal angle with circumferential ulceration. Stricture was dilated to 15 mm with a balloon and appears to be source of delayed gastric emptying. Elevated gastrin level may be secondary to delayed gastric emptying but need to rule out gastrinoma.  Recommendations:  Continue pantoprazole 40 mg by mouth twice a day. Low-fat diet. Abdominopelvic CT to be scheduled. Office visit in 4 weeks.  REHMAN,NAJEEB U  04/28/2012  10:51 AM  CC: Dr. Fredirick Maudlin, MD & Dr. Bonnetta Barry ref. provider found

## 2012-04-29 ENCOUNTER — Other Ambulatory Visit (INDEPENDENT_AMBULATORY_CARE_PROVIDER_SITE_OTHER): Payer: Self-pay | Admitting: Internal Medicine

## 2012-04-29 ENCOUNTER — Telehealth (INDEPENDENT_AMBULATORY_CARE_PROVIDER_SITE_OTHER): Payer: Self-pay | Admitting: *Deleted

## 2012-04-29 DIAGNOSIS — E164 Increased secretion of gastrin: Secondary | ICD-10-CM

## 2012-04-29 DIAGNOSIS — Z0189 Encounter for other specified special examinations: Secondary | ICD-10-CM

## 2012-04-29 DIAGNOSIS — K279 Peptic ulcer, site unspecified, unspecified as acute or chronic, without hemorrhage or perforation: Secondary | ICD-10-CM

## 2012-04-29 NOTE — Telephone Encounter (Signed)
This test is being ordered for CT

## 2012-04-30 ENCOUNTER — Encounter (HOSPITAL_COMMUNITY): Payer: Self-pay | Admitting: Internal Medicine

## 2012-04-30 LAB — CREATININE, SERUM: Creat: 0.88 mg/dL (ref 0.50–1.10)

## 2012-05-04 ENCOUNTER — Ambulatory Visit (HOSPITAL_COMMUNITY)
Admission: RE | Admit: 2012-05-04 | Discharge: 2012-05-04 | Disposition: A | Discharge status: Home / Self Care | Payer: Medicare Other | Source: Ambulatory Visit | Attending: Internal Medicine | Admitting: Internal Medicine

## 2012-05-04 ENCOUNTER — Encounter (HOSPITAL_COMMUNITY): Payer: Self-pay

## 2012-05-04 DIAGNOSIS — K279 Peptic ulcer, site unspecified, unspecified as acute or chronic, without hemorrhage or perforation: Secondary | ICD-10-CM | POA: Insufficient documentation

## 2012-05-04 DIAGNOSIS — E164 Increased secretion of gastrin: Secondary | ICD-10-CM | POA: Insufficient documentation

## 2012-05-04 MED ORDER — IOHEXOL 300 MG/ML  SOLN
100.0000 mL | Freq: Once | INTRAMUSCULAR | Status: AC | PRN
  Administered 2012-05-04: 100 mL via INTRAVENOUS

## 2012-05-06 ENCOUNTER — Telehealth (INDEPENDENT_AMBULATORY_CARE_PROVIDER_SITE_OTHER): Payer: Self-pay | Admitting: *Deleted

## 2012-05-06 DIAGNOSIS — K279 Peptic ulcer, site unspecified, unspecified as acute or chronic, without hemorrhage or perforation: Secondary | ICD-10-CM

## 2012-05-06 DIAGNOSIS — E164 Increased secretion of gastrin: Secondary | ICD-10-CM

## 2012-05-06 NOTE — Telephone Encounter (Signed)
Per Dr.Rehman the patient will need to have in 1 month

## 2012-05-27 ENCOUNTER — Encounter (INDEPENDENT_AMBULATORY_CARE_PROVIDER_SITE_OTHER): Payer: Self-pay | Admitting: *Deleted

## 2012-05-27 ENCOUNTER — Other Ambulatory Visit (INDEPENDENT_AMBULATORY_CARE_PROVIDER_SITE_OTHER): Payer: Self-pay | Admitting: *Deleted

## 2012-05-27 DIAGNOSIS — K279 Peptic ulcer, site unspecified, unspecified as acute or chronic, without hemorrhage or perforation: Secondary | ICD-10-CM

## 2012-05-27 DIAGNOSIS — E164 Increased secretion of gastrin: Secondary | ICD-10-CM

## 2012-06-01 ENCOUNTER — Ambulatory Visit (INDEPENDENT_AMBULATORY_CARE_PROVIDER_SITE_OTHER): Payer: PRIVATE HEALTH INSURANCE | Admitting: Internal Medicine

## 2012-06-01 ENCOUNTER — Encounter (INDEPENDENT_AMBULATORY_CARE_PROVIDER_SITE_OTHER): Payer: Self-pay | Admitting: Internal Medicine

## 2012-06-01 VITALS — BP 118/68 | HR 70 | Temp 98.3°F | Resp 18 | Ht 64.0 in | Wt 179.1 lb

## 2012-06-01 DIAGNOSIS — R197 Diarrhea, unspecified: Secondary | ICD-10-CM

## 2012-06-01 DIAGNOSIS — K279 Peptic ulcer, site unspecified, unspecified as acute or chronic, without hemorrhage or perforation: Secondary | ICD-10-CM

## 2012-06-01 DIAGNOSIS — R109 Unspecified abdominal pain: Secondary | ICD-10-CM

## 2012-06-01 MED ORDER — HYOSCYAMINE SULFATE 0.125 MG SL SUBL: 0.1250 mg | SUBLINGUAL_TABLET | Freq: Three times a day (TID) | SUBLINGUAL | Status: DC | PRN

## 2012-06-01 NOTE — Progress Notes (Signed)
Presenting complaint;  Followup for complicated peptic ulcer disease. Intermittent diarrhea and lower abdominal pain.  Subjective:  Patient is 67 year old Caucasian female who is here for scheduled visit accompanied by her husband meal. She has long history of peptic ulcer disease secondary to NSAID therapy which she has continued to use on intermittent basis despite repeated advice. She underwent EGD on 04/28/2012 with esophageal dilation as well as dilation of duodenal stricture with circumferential ulceration. Patient states she feels much better. She tells me that she has not taken any NSAIDs since her EGD. She has not experienced any episode of vomiting. She is still having intermittent nausea relieved with medication. She still does not feel right in her abdomen and now complains of pain in the right flank and hypogastric region. She has intermittent diarrhea usually 2-3 times a week and she has average of 3 bowel movements on days when she has diarrhea. She denies melena or rectal bleeding hematuria or dysuria. Her weight has been stable since her last visit. She has been able to swallow better since her last EGD/ED.   Current Medications: Current Outpatient Prescriptions  Medication Sig Dispense Refill  . clobetasol (TEMOVATE) 0.05 % cream Apply topically 2 (two) times daily. TO LEGS       . diltiazem (CARDIZEM) 120 MG tablet Take 120 mg by mouth 2 (two) times daily.       . fluconazole (DIFLUCAN) 100 MG tablet Take 100 mg by mouth as needed. For yeast      . furosemide (LASIX) 40 MG tablet Take 40 mg by mouth daily as needed.       . gabapentin (NEURONTIN) 600 MG tablet Take 800 mg by mouth 3 (three) times daily.       Marland Kitchen levothyroxine (SYNTHROID, LEVOTHROID) 50 MCG tablet Take 50 mcg by mouth daily.        Marland Kitchen loperamide (IMODIUM) 2 MG capsule Take 2 mg by mouth as needed.      Marland Kitchen LORazepam (ATIVAN) 1 MG tablet Take 1 mg by mouth at bedtime. For sleep      . metoprolol (LOPRESSOR) 50  MG tablet Take 50 mg by mouth daily.       . ondansetron (ZOFRAN) 4 MG tablet Take 4 mg by mouth every 8 (eight) hours as needed. For nausea       . Oxycodone HCl 10 MG TABS 10 mg daily.       . pantoprazole (PROTONIX) 40 MG tablet Take 1 tablet (40 mg total) by mouth 2 (two) times daily.  60 tablet  5  . Propylene Glycol (SYSTANE BALANCE OP) Apply 2 drops to eye daily as needed. FOR DRY EYES        . pseudoephedrine-guaifenesin (MUCINEX D) 60-600 MG per tablet Take 1 tablet by mouth as needed.      Marland Kitchen terconazole (TERAZOL 7) 0.4 % vaginal cream Place 1 applicator vaginally at bedtime.       . valsartan (DIOVAN) 160 MG tablet Take 160 mg by mouth daily.        Marland Kitchen zolpidem (AMBIEN) 10 MG tablet Take 10 mg by mouth at bedtime as needed. For sleep      . estradiol (ESTRACE) 0.5 MG tablet Take 1 tablet (0.5 mg total) by mouth daily.  30 tablet  5   No current facility-administered medications for this visit.     Objective: Blood pressure 118/68, pulse 70, temperature 98.3 F (36.8 C), temperature source Oral, resp. rate 18, height 5\' 4"  (1.626  m), weight 179 lb 1.6 oz (81.239 kg). Patient is alert and in no acute distress. Conjunctiva is pink. Sclera is nonicteric Oropharyngeal mucosa is normal. No neck masses or thyromegaly noted. Cardiac exam with regular rhythm normal S1 and S2. No murmur or gallop noted. Lungs are clear to auscultation. Abdomen is full. Bowel sounds are normal. On palpation abdomen is soft with mild tenderness in epigastric and hypogastric region but no organomegaly or masses to No LE edema or clubbing noted.  Labs/studies Results:     Range 87mo ago (04/06/12) 187mo ago (06/16/11) 40yr ago (03/21/11) 45yr ago (11/28/10) 62yr ago (11/01/10)   Gastrin <101 pg/mL 580 (H)  157 (H)  239 (H)  143 (H)  165 (H)   Resulting Agency  [facility]   Result Narrative    Assessment:  #1. Complicated peptic ulcer disease secondary to NSAID use. Status  post duodenal stricture dilation about 6 weeks ago. Elevated gastrin level felt to be secondary to chronic PPI therapy and partial gastric outlet obstruction. #2. Lower abdominal pain and intermittent diarrhea felt to be secondary to IBS. #3. High risk medication. Patient is on 10 mg of zolpidem a day and she should not take more than 5 mg daily.    Plan:  Levsin SL one tablet 3 times a day when necessary.  Can take Imodium OTC 2 mg by mouth every morning as planned. Fasting gastrin level rather than on as-needed basis. Decrease Zolpidem 25 mg by mouth each bedtime but also check with Dr. Juanetta Gosling. Office visit in 3 months

## 2012-06-01 NOTE — Patient Instructions (Addendum)
Take Imodium OTC 2 mg by mouth every morning for 2 weeks and see if it controls diarrhea. If you develop constipation drop Imodium dose to 1 mg by mouth every morning. Physician will contact you with result of gastrin level when available. Decrease zolpidem dose to 5 mg by mouth at bedtime. Please also check with Dr. Juanetta Gosling that he is in agreement with this change

## 2012-06-14 ENCOUNTER — Other Ambulatory Visit (HOSPITAL_COMMUNITY): Payer: Self-pay | Admitting: Physical Medicine and Rehabilitation

## 2012-06-14 DIAGNOSIS — M5412 Radiculopathy, cervical region: Secondary | ICD-10-CM

## 2012-06-14 DIAGNOSIS — M4802 Spinal stenosis, cervical region: Secondary | ICD-10-CM

## 2012-06-18 ENCOUNTER — Encounter (HOSPITAL_COMMUNITY): Payer: Self-pay

## 2012-06-18 ENCOUNTER — Ambulatory Visit (HOSPITAL_COMMUNITY)
Admission: RE | Admit: 2012-06-18 | Discharge: 2012-06-18 | Disposition: A | Discharge status: Home / Self Care | Payer: Medicare Other | Source: Ambulatory Visit | Attending: Physical Medicine and Rehabilitation | Admitting: Physical Medicine and Rehabilitation

## 2012-06-18 DIAGNOSIS — M542 Cervicalgia: Secondary | ICD-10-CM | POA: Insufficient documentation

## 2012-06-18 DIAGNOSIS — M25519 Pain in unspecified shoulder: Secondary | ICD-10-CM | POA: Insufficient documentation

## 2012-06-18 DIAGNOSIS — M4802 Spinal stenosis, cervical region: Secondary | ICD-10-CM

## 2012-06-18 DIAGNOSIS — M5020 Other cervical disc displacement, unspecified cervical region: Secondary | ICD-10-CM | POA: Insufficient documentation

## 2012-06-18 DIAGNOSIS — M5412 Radiculopathy, cervical region: Secondary | ICD-10-CM

## 2012-07-05 LAB — HEMOGLOBIN A1C: Hemoglobin A1C: 7

## 2012-07-05 LAB — TSH: TSH: 1.08 (ref 0.41–5.90)

## 2012-07-16 ENCOUNTER — Encounter: Payer: Medicare Other | Admitting: Neurology

## 2012-07-26 ENCOUNTER — Encounter: Payer: Medicare Other | Admitting: Neurology

## 2012-08-31 ENCOUNTER — Encounter (INDEPENDENT_AMBULATORY_CARE_PROVIDER_SITE_OTHER): Payer: Self-pay | Admitting: Internal Medicine

## 2012-08-31 ENCOUNTER — Ambulatory Visit (INDEPENDENT_AMBULATORY_CARE_PROVIDER_SITE_OTHER): Payer: Medicare Other | Admitting: Internal Medicine

## 2012-08-31 VITALS — BP 122/70 | HR 76 | Temp 97.4°F | Resp 18 | Ht 64.0 in | Wt 178.7 lb

## 2012-08-31 DIAGNOSIS — K279 Peptic ulcer, site unspecified, unspecified as acute or chronic, without hemorrhage or perforation: Secondary | ICD-10-CM

## 2012-08-31 DIAGNOSIS — R197 Diarrhea, unspecified: Secondary | ICD-10-CM

## 2012-08-31 LAB — CBC
MCH: 28.1 pg (ref 26.0–34.0)
Platelets: 307 10*3/uL (ref 150–400)
RBC: 4.48 MIL/uL (ref 3.87–5.11)
RDW: 15 % (ref 11.5–15.5)
WBC: 9.2 10*3/uL (ref 4.0–10.5)

## 2012-08-31 NOTE — Patient Instructions (Signed)
Physician will contact you with results of blood work. 

## 2012-08-31 NOTE — Progress Notes (Signed)
Presenting complaint;  Followup for complicated peptic ulcer disease and diarrhea.  Subjective:  Patient is 67 year old Caucasian female who is here for scheduled visit accompanied by her husband Jennette Kettle. She was last seen 3 months ago. Last EGD was in January 2014 revealing pyloric channel inflammation and duodenal stricture which was dilated to 15 mm. She tells me that she has not taken aspirin or Goody powder since January 2014. She has daily nausea worse in the afternoon relieved with ondansetron. She has sporadic vomiting. Last episode was one month ago when she vomited clear fluid. She denies heartburn. Her appetite is good. Her weight has been stable. She had several weeks of diarrhea but now she is better. She's having 1-3 stools per day. She is using Imodium on when necessary basis. She denies melena or rectal bleeding. She has noted intermittent RLQ abdominal pain. She continues to complain of arthralgias. She is scheduled to see her gynecologist and to have a bone density study. She also has left shoulder and neck pain. She has seen Dr. Eduard Clos.  Current Medications: Current Outpatient Prescriptions  Medication Sig Dispense Refill  . clobetasol (TEMOVATE) 0.05 % cream Apply topically 2 (two) times daily. TO LEGS       . diltiazem (CARDIZEM) 120 MG tablet Take 120 mg by mouth 2 (two) times daily.       . fluconazole (DIFLUCAN) 100 MG tablet Take 100 mg by mouth as needed. For yeast      . furosemide (LASIX) 40 MG tablet Take 40 mg by mouth daily as needed.       . gabapentin (NEURONTIN) 600 MG tablet Take 800 mg by mouth 3 (three) times daily.       Marland Kitchen levothyroxine (SYNTHROID, LEVOTHROID) 50 MCG tablet Take 50 mcg by mouth daily.        Marland Kitchen loperamide (IMODIUM) 2 MG capsule Take 2 mg by mouth as needed.      Marland Kitchen LORazepam (ATIVAN) 1 MG tablet Take 1 mg by mouth at bedtime. For sleep      . metoprolol (LOPRESSOR) 50 MG tablet Take 50 mg by mouth daily.       . ondansetron (ZOFRAN) 4 MG tablet  Take 4 mg by mouth every 8 (eight) hours as needed. For nausea       . Oxycodone HCl 10 MG TABS 10 mg daily.       . pantoprazole (PROTONIX) 40 MG tablet Take 1 tablet (40 mg total) by mouth 2 (two) times daily.  60 tablet  5  . Propylene Glycol (SYSTANE BALANCE OP) Apply 2 drops to eye daily as needed. FOR DRY EYES        . pseudoephedrine-guaifenesin (MUCINEX D) 60-600 MG per tablet Take 1 tablet by mouth as needed.      . temazepam (RESTORIL) 15 MG capsule Take 15 mg by mouth at bedtime as needed.       . valsartan (DIOVAN) 160 MG tablet Take 160 mg by mouth daily. Patient , per husband , takes 1/2 tablet at lunch time      . estradiol (ESTRACE) 0.5 MG tablet Take 1 tablet (0.5 mg total) by mouth daily.  30 tablet  5  . hyoscyamine (LEVSIN SL) 0.125 MG SL tablet Place 1 tablet (0.125 mg total) under the tongue 3 (three) times daily as needed.  30 tablet  0   No current facility-administered medications for this visit.     Objective: Blood pressure 122/70, pulse 76, temperature 97.4 F (36.3  C), temperature source Oral, resp. rate 18, height 5\' 4"  (1.626 m), weight 178 lb 11.2 oz (81.058 kg). Patient is alert and in no acute distress. Conjunctiva is pink. Sclera is nonicteric Oropharyngeal mucosa is normal. No neck masses or thyromegaly noted. Cardiac exam with regular rhythm normal S1 and S2. No murmur or gallop noted. Lungs are clear to auscultation. Abdomen. Bowel sounds are normal. Abdomen is soft with mild tenderness in RLQ. No organomegaly or masses  No LE edema or clubbing noted. She has swelling to the medial 3 metatarsal carpal phalangeal joints of left hand.  Labs/studies Results: Gastrin level was 429 on 05/27/2012. Gastrin level was 580 on 04/06/2012. Gastrin level was 157 on 06/16/2011. Gastrin level 239 on 03/21/2011. Gastrin level 143 on 11/28/2010. Gastrin level 165 on 11/01/2010   Assessment:  #1. Peptic ulcer disease complicated by duodenal stricture.  She  is having frequent nausea and rare vomiting. Nausea possibly multifactorial. If it gets worse will check gastric emptying. #2. Elevated serum gastrin levels secondary to PPI and poor gastric emptying secondary to duodenal stenosis. Doubt gastrinoma given trend but will monitor. #3. Diarrhea. Felt to be secondary to IBS.    Plan:  CBC and serum gastrin levels. Call if nausea gets worse. Take loperamide 2 mg by mouth every morning Office visit in 4 months.

## 2012-09-02 LAB — GASTRIN: Gastrin: 48 pg/mL (ref <=100)

## 2012-09-02 IMAGING — CR DG ABDOMEN ACUTE W/ 1V CHEST
3 series · 3 of 3 positions shown · non-contrast
Comparison: Chest radiograph dated 05/12/2009.

CLINICAL DATA: Abdominal pain

ACUTE ABDOMEN SERIES (ABDOMEN 2 VIEW & CHEST 1 VIEW)

[view not recorded (1 of 3)]
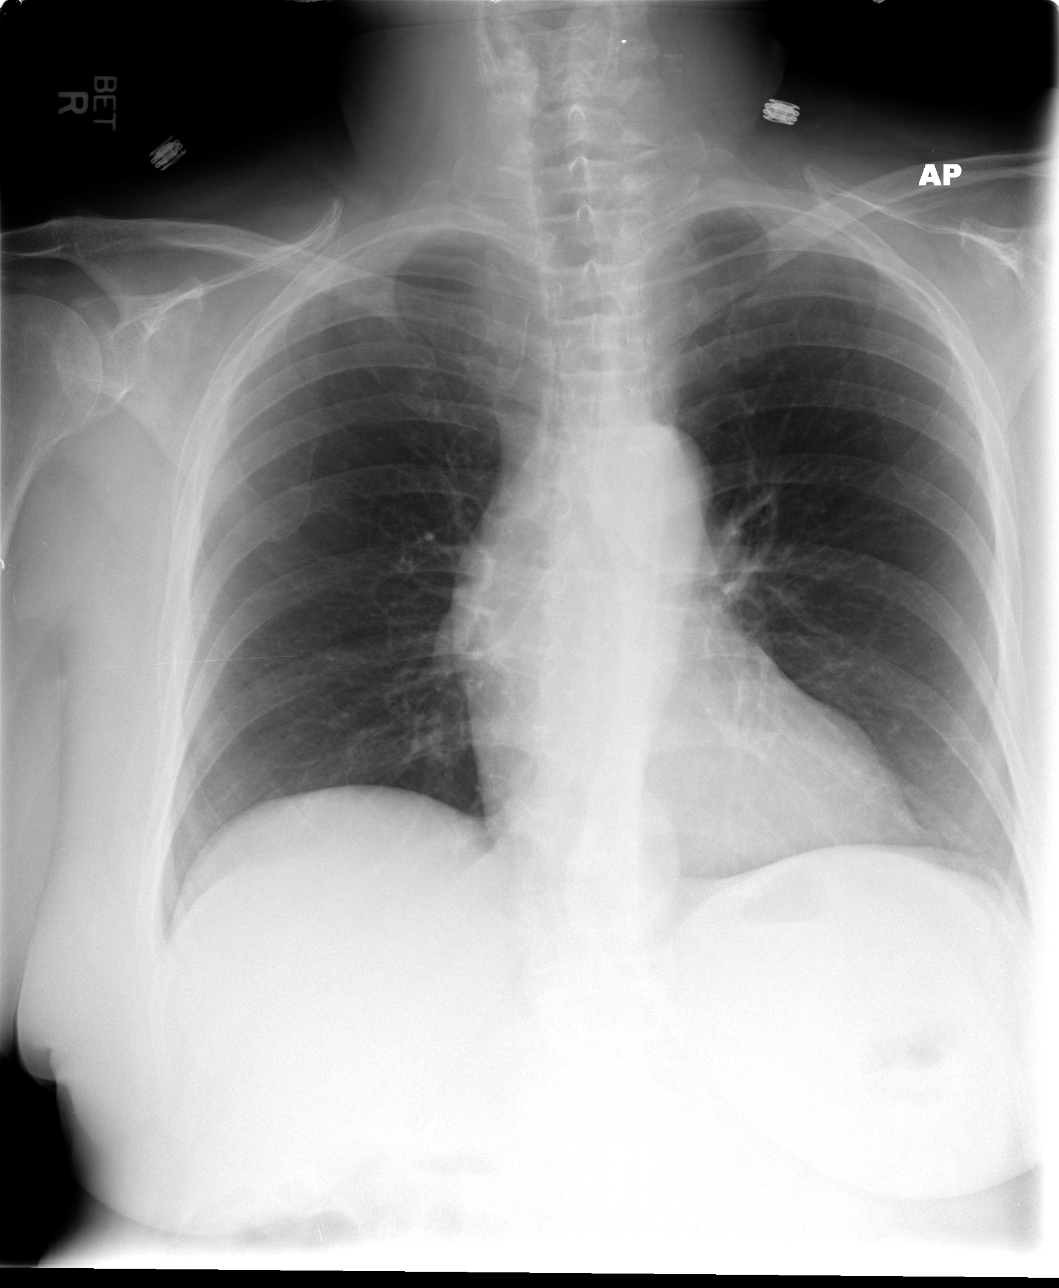

[view not recorded (2 of 3)]
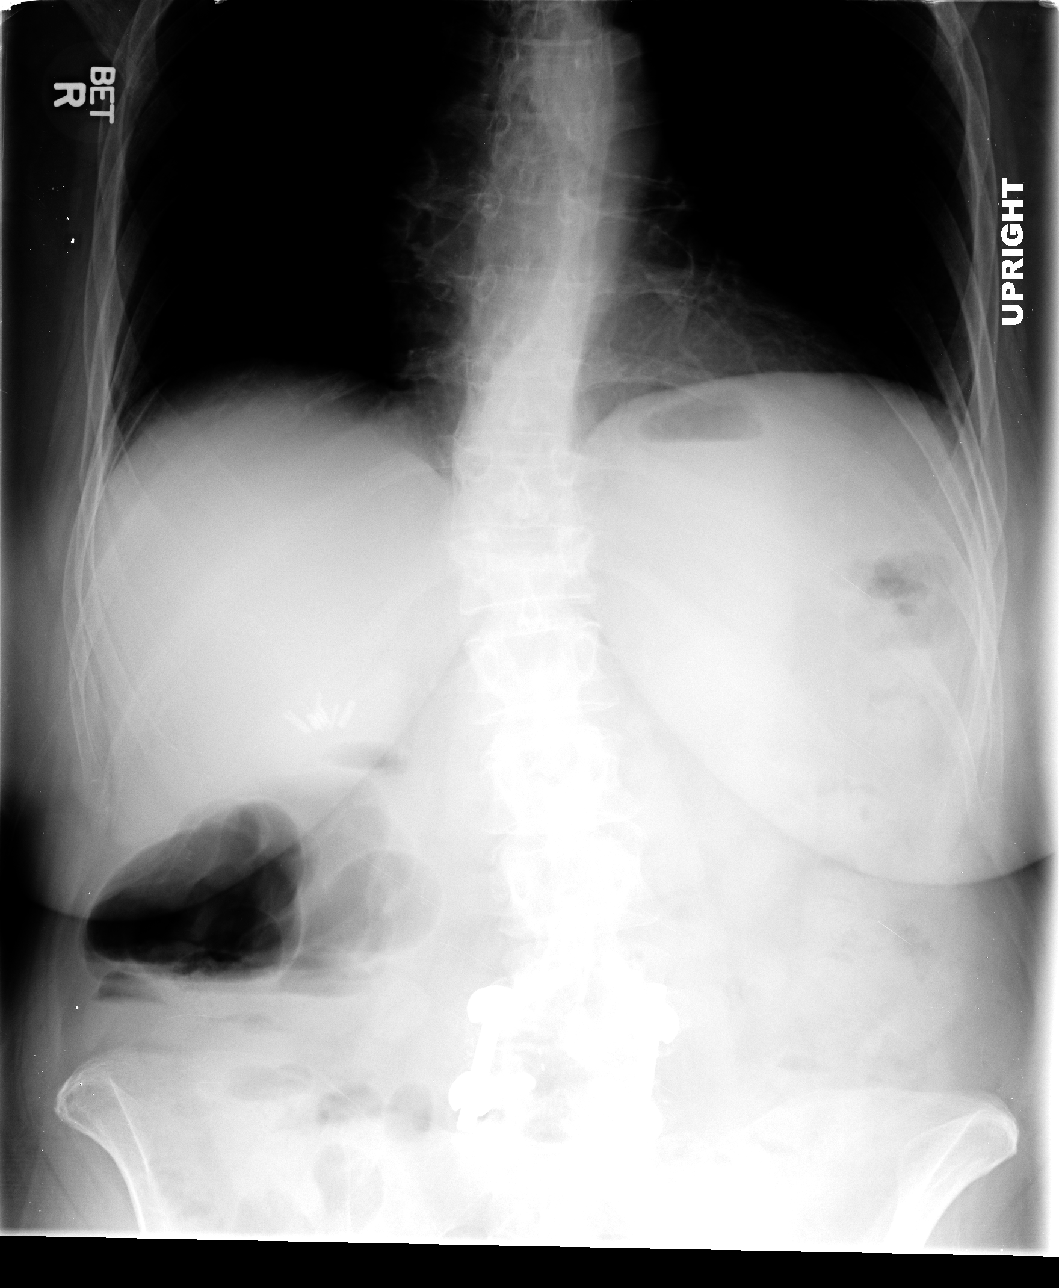

[view not recorded (3 of 3)]
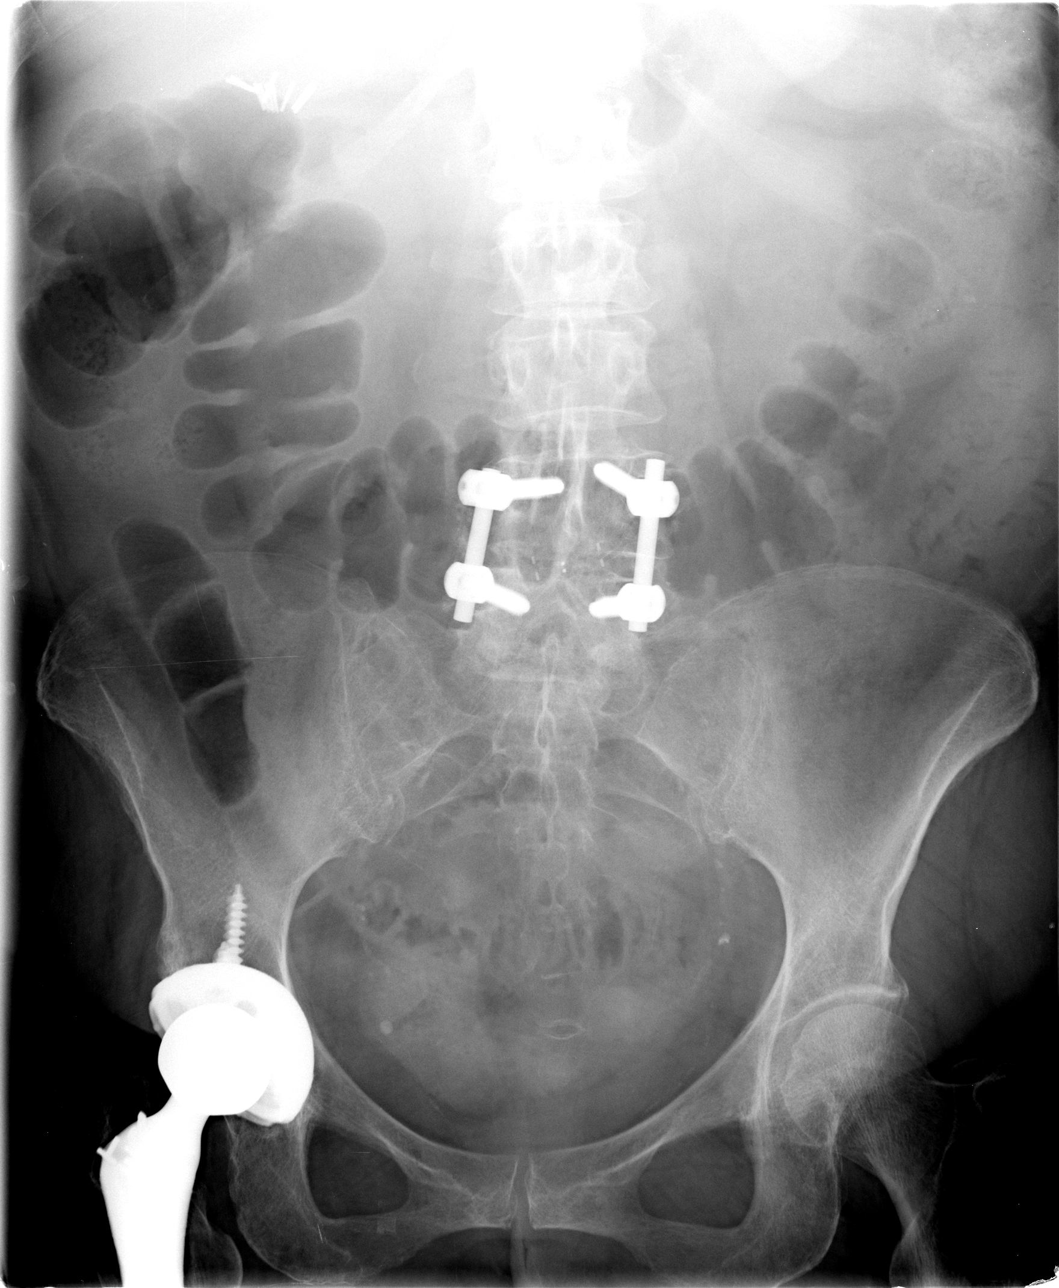

[3 of 3 positions shown; findings below may reference images not displayed]

FINDINGS: Lungs are clear. No pleural effusion or pneumothorax.

Cardiomediastinal silhouette is within normal limits.

Nonobstructive bowel gas pattern.

No evidence of free air under the diaphragm on the upright view.

Cholecystectomy clips.

Degenerative changes of the visualized thoracolumbar spine.  Prior
posterior surgical fixation at L5 and S1.  Right hip arthroplasty.
IMPRESSION: No evidence of acute cardiopulmonary disease.

No evidence of small bowel obstruction or free air.

Postsurgical changes, as described above.

## 2012-09-13 ENCOUNTER — Other Ambulatory Visit: Payer: Self-pay | Admitting: Obstetrics and Gynecology

## 2012-09-13 DIAGNOSIS — R928 Other abnormal and inconclusive findings on diagnostic imaging of breast: Secondary | ICD-10-CM

## 2012-09-24 ENCOUNTER — Ambulatory Visit
Admission: RE | Admit: 2012-09-24 | Discharge: 2012-09-24 | Disposition: A | Discharge status: Home / Self Care | Payer: Medicare Other | Source: Ambulatory Visit | Attending: Obstetrics and Gynecology | Admitting: Obstetrics and Gynecology

## 2012-09-24 DIAGNOSIS — R928 Other abnormal and inconclusive findings on diagnostic imaging of breast: Secondary | ICD-10-CM

## 2012-10-04 ENCOUNTER — Ambulatory Visit (INDEPENDENT_AMBULATORY_CARE_PROVIDER_SITE_OTHER): Payer: Medicare Other | Admitting: Internal Medicine

## 2012-10-06 ENCOUNTER — Ambulatory Visit (INDEPENDENT_AMBULATORY_CARE_PROVIDER_SITE_OTHER): Payer: Medicare Other | Admitting: Internal Medicine

## 2012-11-23 LAB — BASIC METABOLIC PANEL
BUN: 8 (ref 4–21)
Creatinine: 0.9 (ref 0.5–1.1)
Glucose: 127
Potassium: 4.7 (ref 3.4–5.3)
Sodium: 133 — AB (ref 137–147)

## 2012-11-23 LAB — LIPID PANEL
Cholesterol: 128 (ref 0–200)
HDL: 22 — AB (ref 35–70)
LDL Cholesterol: 85
Triglycerides: 107 (ref 40–160)

## 2012-11-23 LAB — HEPATIC FUNCTION PANEL
ALT: 41 — AB (ref 7–35)
AST: 53 — AB (ref 13–35)
Alkaline Phosphatase: 75 (ref 25–125)

## 2012-11-23 LAB — COMPREHENSIVE METABOLIC PANEL: Calcium: 8.5 — AB (ref 8.7–10.7)

## 2013-01-03 ENCOUNTER — Encounter (INDEPENDENT_AMBULATORY_CARE_PROVIDER_SITE_OTHER): Payer: Self-pay | Admitting: Internal Medicine

## 2013-01-03 ENCOUNTER — Ambulatory Visit (INDEPENDENT_AMBULATORY_CARE_PROVIDER_SITE_OTHER): Payer: Medicare Other | Admitting: Internal Medicine

## 2013-01-03 VITALS — BP 128/70 | HR 76 | Temp 97.1°F | Resp 18 | Ht 64.0 in | Wt 173.4 lb

## 2013-01-03 DIAGNOSIS — K219 Gastro-esophageal reflux disease without esophagitis: Secondary | ICD-10-CM

## 2013-01-03 DIAGNOSIS — L0390 Cellulitis, unspecified: Secondary | ICD-10-CM | POA: Insufficient documentation

## 2013-01-03 DIAGNOSIS — K279 Peptic ulcer, site unspecified, unspecified as acute or chronic, without hemorrhage or perforation: Secondary | ICD-10-CM

## 2013-01-03 NOTE — Patient Instructions (Signed)
Go back on pantoprazole and taken by mouth 30 minutes before breakfast daily. Cup or two of yogurt or probiotic one capsule every day while on antibiotic Followup with Dr. Juanetta Gosling and Dr. Yetta Barre as planned.

## 2013-01-03 NOTE — Progress Notes (Signed)
Presenting complaint;  Followup for GERD, peptic ulcer disease and diarrhea.  Subjective:  Patient is 67 year old Caucasian female who presents for scheduled visit accompanied by her husband. She was last seen 4 months ago. She states as far as her GI system is concerned she is doing very well she denies heartburn nausea vomiting epigastric pain or melena. Since her last visit she has had very few episodes of postprandial diarrhea. However now she is struggling with cellulitis involving her upper extremities. She has history of MRSA left arm. She was seen by Dr. Juanetta Gosling and begun on Bactrim. He wants her to take it for a total of 3 weeks. Since then she also has seen her dermatologist Dr. Yetta Barre who was recommended topical. Her husband believes that  Cellulitis is getting better. Patient states skin is not as warm as it used to be. She stopped lansoprazole few days ago began her cellulitis was a reaction to this medication. Her weight is down by 5 pounds since her last visit.  Current Medications: Current Outpatient Prescriptions  Medication Sig Dispense Refill  . diltiazem (CARDIZEM) 120 MG tablet Take 120 mg by mouth 2 (two) times daily.       Marland Kitchen estradiol (ESTRACE) 0.5 MG tablet Take 0.5 mg by mouth daily.      Marland Kitchen gabapentin (NEURONTIN) 600 MG tablet Take 800 mg by mouth 2 (two) times daily.       Marland Kitchen levothyroxine (SYNTHROID, LEVOTHROID) 50 MCG tablet Take 50 mcg by mouth daily.        Marland Kitchen loperamide (IMODIUM) 2 MG capsule Take 2 mg by mouth as needed.      Marland Kitchen LORazepam (ATIVAN) 1 MG tablet Take 1 mg by mouth 2 (two) times daily. For sleep      . metoprolol (LOPRESSOR) 50 MG tablet Take 50 mg by mouth daily.       Marland Kitchen Propylene Glycol (SYSTANE BALANCE OP) Apply 2 drops to eye daily as needed. FOR DRY EYES        . pseudoephedrine-guaifenesin (MUCINEX D) 60-600 MG per tablet Take 1 tablet by mouth as needed.      . sulfamethoxazole-trimethoprim (BACTRIM DS) 800-160 MG per tablet Take 1 tablet by mouth  2 (two) times daily.       . valsartan (DIOVAN) 160 MG tablet Take 160 mg by mouth daily.       . vitamin C (ASCORBIC ACID) 500 MG tablet Take 500 mg by mouth daily.      Marland Kitchen estradiol (ESTRACE) 0.5 MG tablet Take 1 tablet (0.5 mg total) by mouth daily.  30 tablet  5   No current facility-administered medications for this visit.     Objective: Blood pressure 128/70, pulse 76, temperature 97.1 F (36.2 C), temperature source Oral, resp. rate 18, height 5\' 4"  (1.626 m), weight 173 lb 6.4 oz (78.654 kg). Patient is alert and in no acute distress. Conjunctiva is pink. Sclera is nonicteric Oropharyngeal mucosa is normal. No neck masses or thyromegaly noted. Cardiac exam with regular rhythm normal S1 and S2. Faint systolic murmur noted at LLSB and aortic area. Lungs are clear to auscultation. Abdomen is full. It is soft and nontender without organomegaly or masses. She has erythema and edema the skin of both forearms and distal arms and she has few areas with small skin excoriations are healing ulcers. No vesicles or drainage noted. Few small areas of bruising noted as well. She has trace edema about the ankles.  Labs/studies Results: CBC on 08/31/2012 was  normal. H&H was 12.6 and 37.1 Serum gastrin level was 48;   Assessment:  #1. Patient has complicated history of peptic ulcer disease. She is presently asymptomatic but at risk for recurrent disease and she needs to be back on PPI. Dermatitis does not appear to be secondary to this medication. #2. GERD. She is at risk for relapse of her symptoms and therefore needs to be back on PPI to #3. Irritable bowel syndrome. Symptoms have been quiet since lately. .   Plan:  Patient advised to go back on pantoprazole at 40 mg by mouth every morning. She will call if dermatitis/rash gets worse. She must keep her appointment with Dr. Juanetta Gosling and  Dr. Yetta Barre. Office visit in 4 months.

## 2013-02-15 ENCOUNTER — Ambulatory Visit (HOSPITAL_COMMUNITY)
Admission: RE | Admit: 2013-02-15 | Discharge: 2013-02-15 | Disposition: A | Discharge status: Home / Self Care | Payer: Medicare Other | Source: Ambulatory Visit | Attending: Pulmonary Disease | Admitting: Pulmonary Disease

## 2013-02-15 ENCOUNTER — Other Ambulatory Visit (HOSPITAL_COMMUNITY): Payer: Self-pay | Admitting: Pulmonary Disease

## 2013-02-15 DIAGNOSIS — IMO0001 Reserved for inherently not codable concepts without codable children: Secondary | ICD-10-CM | POA: Insufficient documentation

## 2013-02-15 DIAGNOSIS — R059 Cough, unspecified: Secondary | ICD-10-CM

## 2013-02-15 DIAGNOSIS — E119 Type 2 diabetes mellitus without complications: Secondary | ICD-10-CM | POA: Insufficient documentation

## 2013-02-15 DIAGNOSIS — IMO0002 Reserved for concepts with insufficient information to code with codable children: Secondary | ICD-10-CM | POA: Insufficient documentation

## 2013-02-15 DIAGNOSIS — X58XXXA Exposure to other specified factors, initial encounter: Secondary | ICD-10-CM | POA: Insufficient documentation

## 2013-02-15 DIAGNOSIS — I10 Essential (primary) hypertension: Secondary | ICD-10-CM | POA: Insufficient documentation

## 2013-02-15 DIAGNOSIS — R05 Cough: Secondary | ICD-10-CM

## 2013-02-15 LAB — HEMOGLOBIN A1C: Hemoglobin A1C: 6.4

## 2013-04-18 ENCOUNTER — Other Ambulatory Visit (HOSPITAL_COMMUNITY): Payer: Self-pay | Admitting: *Deleted

## 2013-04-19 ENCOUNTER — Ambulatory Visit (HOSPITAL_COMMUNITY)
Admission: RE | Admit: 2013-04-19 | Discharge: 2013-04-19 | Disposition: A | Discharge status: Home / Self Care | Payer: Medicare Other | Source: Ambulatory Visit | Attending: Obstetrics and Gynecology | Admitting: Obstetrics and Gynecology

## 2013-04-19 DIAGNOSIS — M810 Age-related osteoporosis without current pathological fracture: Secondary | ICD-10-CM | POA: Insufficient documentation

## 2013-04-19 MED ORDER — ZOLEDRONIC ACID 5 MG/100ML IV SOLN
INTRAVENOUS | Status: AC
  Administered 2013-04-19: 5 mg via INTRAVENOUS
  Filled 2013-04-19: qty 100

## 2013-04-19 MED ORDER — ZOLEDRONIC ACID 5 MG/100ML IV SOLN
5.0000 mg | Freq: Once | INTRAVENOUS | Status: AC
  Administered 2013-04-19: 5 mg via INTRAVENOUS

## 2013-05-02 ENCOUNTER — Other Ambulatory Visit (INDEPENDENT_AMBULATORY_CARE_PROVIDER_SITE_OTHER): Payer: Self-pay | Admitting: Internal Medicine

## 2013-05-09 ENCOUNTER — Encounter (INDEPENDENT_AMBULATORY_CARE_PROVIDER_SITE_OTHER): Payer: Self-pay | Admitting: Internal Medicine

## 2013-05-09 ENCOUNTER — Ambulatory Visit (INDEPENDENT_AMBULATORY_CARE_PROVIDER_SITE_OTHER): Payer: Medicare Other | Admitting: Internal Medicine

## 2013-05-09 VITALS — BP 120/68 | HR 76 | Temp 98.0°F | Resp 18 | Ht 64.0 in | Wt 165.5 lb

## 2013-05-09 DIAGNOSIS — K279 Peptic ulcer, site unspecified, unspecified as acute or chronic, without hemorrhage or perforation: Secondary | ICD-10-CM

## 2013-05-09 DIAGNOSIS — K219 Gastro-esophageal reflux disease without esophagitis: Secondary | ICD-10-CM

## 2013-05-09 DIAGNOSIS — K589 Irritable bowel syndrome without diarrhea: Secondary | ICD-10-CM

## 2013-05-09 NOTE — Patient Instructions (Signed)
Please send [place] copy of your next blood work

## 2013-05-09 NOTE — Progress Notes (Signed)
Presenting complaint;  Followup for GERD, peptic ulcer disease and diarrhea.  Subjective:  Patient is 61 old Caucasian female who presents for scheduled visit. She was last seen 4 months ago. She is accompanied by her husband Nori Riis today. She denies heartburn or abdominal pain. Since her last visit she has taken very few doses of Imodium. However she's been sick for the last 6 weeks with bronchitis. She is now on Bactrim. She also takes ampicillin for skin rash and cellulitis involving upper extremities. She is now on Levemir for hyperglycemia. Her husband states her glucose levels became very high with prednisone but she is still taking. She has not fully recovered from bronchitis. Her cough has resolved. Her appetite has not returned. She has lost 8 pounds since her last visit. Her husband states she became confused with Hycodan but she is not taking it anymore. She is having 1 formed stool daily. She denies melena or rectal bleeding. She received Reclast on 04/19/2013; was given to her by her gynecologist Dr. Orvan Seen.  Current Medications: Current Outpatient Prescriptions  Medication Sig Dispense Refill  . ampicillin (PRINCIPEN) 500 MG capsule Take 500 mg by mouth 2 (two) times daily.       Marland Kitchen diltiazem (CARDIZEM) 120 MG tablet Take 120 mg by mouth 2 (two) times daily.       Marland Kitchen estradiol (ESTRACE) 0.5 MG tablet Take 0.5 mg by mouth daily.      . furosemide (LASIX) 40 MG tablet Take 40 mg by mouth as needed.       . gabapentin (NEURONTIN) 600 MG tablet Take 800 mg by mouth 2 (two) times daily.       . insulin detemir (LEVEMIR) 100 UNIT/ML injection Inject 10 Units into the skin at bedtime.      Marland Kitchen levothyroxine (SYNTHROID, LEVOTHROID) 50 MCG tablet Take 50 mcg by mouth daily.        Marland Kitchen loperamide (IMODIUM) 2 MG capsule Take 2 mg by mouth as needed.      Marland Kitchen LORazepam (ATIVAN) 1 MG tablet Take 1 mg by mouth 3 (three) times daily. For sleep      . metoprolol (LOPRESSOR) 50 MG tablet Take 50 mg by mouth  daily.       . pantoprazole (PROTONIX) 40 MG tablet Take 1 tablet (40 mg total) by mouth daily.  30 tablet  5  . predniSONE (DELTASONE) 10 MG tablet Take 10 mg by mouth daily with breakfast.      . Propylene Glycol (SYSTANE BALANCE OP) Apply 2 drops to eye daily as needed. FOR DRY EYES        . pseudoephedrine-guaifenesin (MUCINEX D) 60-600 MG per tablet Take 1 tablet by mouth as needed.      . sulfamethoxazole-trimethoprim (BACTRIM DS) 800-160 MG per tablet Take 1 tablet by mouth 2 (two) times daily.       . temazepam (RESTORIL) 30 MG capsule Take 30 mg by mouth at bedtime.       . valsartan (DIOVAN) 160 MG tablet Take 160 mg by mouth daily.       . vitamin C (ASCORBIC ACID) 500 MG tablet Take 500 mg by mouth daily.      Marland Kitchen estradiol (ESTRACE) 0.5 MG tablet Take 1 tablet (0.5 mg total) by mouth daily.  30 tablet  5   No current facility-administered medications for this visit.     Objective: Blood pressure 120/68, pulse 76, temperature 98 F (36.7 C), temperature source Oral, resp. rate 18, height  5\' 4"  (1.626 m), weight 165 lb 8 oz (75.07 kg). Patient is alert and in no acute distress. She has a round face is and focal edema and erythema of the skin below the left orbit. Conjunctiva is pink. Sclera is nonicteric Oropharyngeal mucosa is normal. No neck masses or thyromegaly noted. Cardiac exam with regular rhythm normal S1 and S2. She has faint systolic ejection murmur at LLSB. Lungs are clear to auscultation. Abdomen is symmetrical. It is soft and nontender without organomegaly or masses.  No LE edema or clubbing noted.   Assessment:  #1. GERD. Symptoms are well controlled with single dose of PPI. At one point she was on double dose PPI. #2. History of recurrent peptic ulcer disease with partial outlet obstruction resulting in 5 fold elevation of serum gastrin level which has returned back to normal. She has been symptom free once she stopped taking NSAIDs. #3. Irritable bowel  syndrome. Symptoms have subsided. She is using Imodium occasionally. She is on 2 antibiotics and at risk for C. difficile colitis.   Plan  Patient advised to eat yogurt daily and also take probiotic while she is on antibiotics. Patient will call if she develops diarrhea in which case will need C. difficile toxin titer. Office visit in 6 months.

## 2013-05-16 ENCOUNTER — Other Ambulatory Visit (INDEPENDENT_AMBULATORY_CARE_PROVIDER_SITE_OTHER): Payer: Self-pay | Admitting: Internal Medicine

## 2013-05-16 DIAGNOSIS — K219 Gastro-esophageal reflux disease without esophagitis: Secondary | ICD-10-CM

## 2013-05-16 MED ORDER — PANTOPRAZOLE SODIUM 40 MG PO TBEC: 40.0000 mg | DELAYED_RELEASE_TABLET | Freq: Every day | ORAL | Status: DC

## 2013-08-01 LAB — CBC AND DIFFERENTIAL
HCT: 30 — AB (ref 36–46)
Hemoglobin: 9.4 — AB (ref 12.0–16.0)
Platelets: 417 — AB (ref 150–399)
WBC: 14.3

## 2013-08-01 LAB — HEMOGLOBIN A1C
Hemoglobin A1C: 9.1
Hemoglobin A1C: 9.1

## 2013-08-01 LAB — BASIC METABOLIC PANEL
BUN: 12 (ref 4–21)
Creatinine: 0.7 (ref <=1.1)
Glucose: 161
Potassium: 4.5 (ref 3.4–5.3)
Sodium: 129 — AB (ref 137–147)

## 2013-08-01 LAB — COMPREHENSIVE METABOLIC PANEL: Calcium: 8.6 — AB (ref 8.7–10.7)

## 2013-08-01 LAB — TSH: TSH: 1.65 (ref <=5.90)

## 2013-08-18 ENCOUNTER — Telehealth (INDEPENDENT_AMBULATORY_CARE_PROVIDER_SITE_OTHER): Payer: Self-pay | Admitting: *Deleted

## 2013-08-18 DIAGNOSIS — K279 Peptic ulcer, site unspecified, unspecified as acute or chronic, without hemorrhage or perforation: Secondary | ICD-10-CM

## 2013-08-18 DIAGNOSIS — D649 Anemia, unspecified: Secondary | ICD-10-CM

## 2013-08-18 NOTE — Telephone Encounter (Signed)
Patient called and states that her Hgb was 9 . This was drawn by her PCP. Dr.Rehman made aware. He ask that we do hemoccults x 3 and Iron Studies Patient denies taking any arthritis medications/NSAID'S. She is taking Neurontin. Patient is aware of Dr.Rehman's recommendations.

## 2013-08-19 LAB — IRON AND TIBC
%SAT: 6 % — AB (ref 20–55)
Iron: 26 ug/dL — ABNORMAL LOW (ref 42–145)
TIBC: 452 ug/dL (ref 250–470)
UIBC: 426 ug/dL — AB (ref 125–400)

## 2013-08-19 LAB — FERRITIN: Ferritin: 9 ng/mL — ABNORMAL LOW (ref 10–291)

## 2013-08-23 ENCOUNTER — Telehealth (INDEPENDENT_AMBULATORY_CARE_PROVIDER_SITE_OTHER): Payer: Self-pay | Admitting: *Deleted

## 2013-08-23 NOTE — Telephone Encounter (Signed)
Open in error

## 2013-08-24 ENCOUNTER — Telehealth (INDEPENDENT_AMBULATORY_CARE_PROVIDER_SITE_OTHER): Payer: Self-pay | Admitting: *Deleted

## 2013-08-24 DIAGNOSIS — D509 Iron deficiency anemia, unspecified: Secondary | ICD-10-CM

## 2013-08-24 NOTE — Telephone Encounter (Signed)
   Diagnosis: IDA   Result(s)   Card 1: Negative:     Card 2:Negative   Card 3: Negative    Completed by: Thomas Hoff , LPN   HEMOCCULT SENSA DEVELOPER: LOT#:  [id number] EXPIRATION DATE: 2016-05   HEMOCCULT SENSA CARD:  LOT#:  [id number] EXPIRATION DATE: 04-16   CARD CONTROL RESULTS:  POSITIVE: Positive NEGATIVE: Negative    ADDITIONAL COMMENTS: Patient was called and made aware of results.

## 2013-08-25 ENCOUNTER — Encounter (HOSPITAL_COMMUNITY)
Admission: RE | Admit: 2013-08-25 | Discharge: 2013-08-25 | Disposition: A | Discharge status: Home / Self Care | Payer: Medicare Other | Source: Ambulatory Visit | Attending: Internal Medicine | Admitting: Internal Medicine

## 2013-08-25 DIAGNOSIS — K279 Peptic ulcer, site unspecified, unspecified as acute or chronic, without hemorrhage or perforation: Secondary | ICD-10-CM | POA: Insufficient documentation

## 2013-08-25 DIAGNOSIS — D509 Iron deficiency anemia, unspecified: Secondary | ICD-10-CM | POA: Insufficient documentation

## 2013-08-25 MED ORDER — FERUMOXYTOL INJECTION 510 MG/17 ML: 510.0000 mg | Freq: Once | INTRAVENOUS | Status: DC

## 2013-08-25 MED ORDER — SODIUM CHLORIDE 0.9 % IJ SOLN
10.0000 mL | Freq: Once | INTRAMUSCULAR | Status: AC
  Administered 2013-08-25: 10 mL via INTRAVENOUS

## 2013-08-25 MED ORDER — SODIUM CHLORIDE 0.9 % IV SOLN
510.0000 mg | Freq: Once | INTRAVENOUS | Status: AC
  Administered 2013-08-25: 510 mg via INTRAVENOUS
  Filled 2013-08-25: qty 17

## 2013-08-29 NOTE — Telephone Encounter (Signed)
All 3 Hemoccults are negative 

## 2013-09-01 ENCOUNTER — Encounter (HOSPITAL_COMMUNITY)
Admission: RE | Admit: 2013-09-01 | Discharge: 2013-09-01 | Disposition: A | Discharge status: Home / Self Care | Payer: Medicare Other | Source: Ambulatory Visit | Attending: Internal Medicine | Admitting: Internal Medicine

## 2013-09-01 DIAGNOSIS — D509 Iron deficiency anemia, unspecified: Secondary | ICD-10-CM | POA: Insufficient documentation

## 2013-09-01 DIAGNOSIS — K279 Peptic ulcer, site unspecified, unspecified as acute or chronic, without hemorrhage or perforation: Secondary | ICD-10-CM | POA: Insufficient documentation

## 2013-09-01 MED ORDER — SODIUM CHLORIDE 0.9 % IV SOLN
510.0000 mg | Freq: Once | INTRAVENOUS | Status: AC
  Administered 2013-09-01: 510 mg via INTRAVENOUS
  Filled 2013-09-01: qty 17

## 2013-09-01 MED ORDER — SODIUM CHLORIDE 0.9 % IV SOLN
INTRAVENOUS | Status: DC
  Administered 2013-09-01: 200 mL via INTRAVENOUS

## 2013-09-06 ENCOUNTER — Ambulatory Visit (INDEPENDENT_AMBULATORY_CARE_PROVIDER_SITE_OTHER): Payer: Medicare Other | Admitting: Internal Medicine

## 2013-09-06 ENCOUNTER — Encounter (INDEPENDENT_AMBULATORY_CARE_PROVIDER_SITE_OTHER): Payer: Self-pay | Admitting: Internal Medicine

## 2013-09-06 VITALS — BP 118/80 | HR 76 | Temp 97.9°F | Resp 18 | Ht 64.0 in | Wt 181.5 lb

## 2013-09-06 DIAGNOSIS — K219 Gastro-esophageal reflux disease without esophagitis: Secondary | ICD-10-CM

## 2013-09-06 DIAGNOSIS — D509 Iron deficiency anemia, unspecified: Secondary | ICD-10-CM

## 2013-09-06 MED ORDER — FERROUS SULFATE 325 (65 FE) MG PO TABS: 325.0000 mg | ORAL_TABLET | Freq: Every day | ORAL | Status: DC

## 2013-09-06 NOTE — Progress Notes (Signed)
Presenting complaint;  Followup for iron deficiency anemia;  Subjective:  Patient is 68 year old Caucasian female with multiple medical problems who also has history of peptic ulcer disease and iron deficiency anemia. Patient was last seen 4 months ago and was doing well. She recently had blood work by Dr. Luan Pulling and noted to have microcytic anemia with hemoglobin of 9.4 g on 08/01/2013 and MCV was 75.2. Iron studies confirmed IDA. She had 3 Hemoccults were office and these were negative. Patient is accompanied by her husband Nori Riis. She states other than feeling tired and weak she has done well. She hasn't had any problems with skin rashes he's been on prednisone. He rarely experiences heartburn or regurgitation. She did have an episode of emesis after eating barbecue rib but does not recall that she had hematemesis. She denies melena or rectal bleeding hematuria or vaginal bleeding. She has good appetite and her weight is increased by 16 pounds as of last visit. She rarely takes Advil. She also denies dysphagia or abdominal pain. She has occasional diarrhea controlled with Imodium. She has not taken iron pill in several months. Last EGD was in January 2014 revealing noncritical ring at GE junction small sliding-type hernia pyloric channel inflammation but it was patent. She also had stricture at angle of the duodenum a circumferential ulcer and stricture was dilated with a balloon to 15 mm. She has history of elevated serum gastrin felt to be secondary to outlet obstruction. Serum gastrin peaked to 580 in January 2014 was down to 48 in June 2014. Since last colonoscopy was possibly 4 or 5 years ago at [facility]. Patient has received 2 infusions of Feraheme.   Current Medications: Outpatient Encounter Prescriptions as of 09/06/2013  Medication Sig  . diltiazem (CARDIZEM) 120 MG tablet Take 120 mg by mouth 2 (two) times daily.   Marland Kitchen estradiol (ESTRACE) 0.5 MG tablet Take 0.5 mg by  mouth daily.  . furosemide (LASIX) 40 MG tablet Take 40 mg by mouth as needed.   . gabapentin (NEURONTIN) 600 MG tablet Take 800 mg by mouth 2 (two) times daily.   . insulin detemir (LEVEMIR) 100 UNIT/ML injection Inject 15 Units into the skin at bedtime.   Marland Kitchen levothyroxine (SYNTHROID, LEVOTHROID) 50 MCG tablet Take 50 mcg by mouth daily.    Marland Kitchen loperamide (IMODIUM) 2 MG capsule Take 2 mg by mouth as needed.  Marland Kitchen LORazepam (ATIVAN) 1 MG tablet Take 1 mg by mouth 3 (three) times daily. For sleep  . metoprolol (LOPRESSOR) 50 MG tablet Take 50 mg by mouth daily.   . pantoprazole (PROTONIX) 40 MG tablet Take 1 tablet (40 mg total) by mouth daily.  . predniSONE (DELTASONE) 10 MG tablet Take 10 mg by mouth daily with breakfast.  . Propylene Glycol (SYSTANE BALANCE OP) Apply 2 drops to eye daily as needed. FOR DRY EYES    . pseudoephedrine-guaifenesin (MUCINEX D) 60-600 MG per tablet Take 1 tablet by mouth as needed.  . sulfamethoxazole-trimethoprim (BACTRIM DS) 800-160 MG per tablet Take 1 tablet by mouth 2 (two) times daily.   . temazepam (RESTORIL) 30 MG capsule Take 30 mg by mouth at bedtime.   . valsartan (DIOVAN) 160 MG tablet Take 160 mg by mouth daily.   . vitamin C (ASCORBIC ACID) 500 MG tablet Take 500 mg by mouth daily.  Marland Kitchen estradiol (ESTRACE) 0.5 MG tablet Take 1 tablet (0.5 mg total) by mouth daily.  . [DISCONTINUED] ampicillin (PRINCIPEN) 500 MG capsule Take 500 mg by  mouth 2 (two) times daily.      Objective: Blood pressure 118/80, pulse 76, temperature 97.9 F (36.6 C), temperature source Oral, resp. rate 18, height 5\' 4"  (1.626 m), weight 181 lb 8 oz (82.328 kg). Patient is alert and in no acute distress. She has round facies. Conjunctiva is pink. Sclera is nonicteric Oropharyngeal mucosa is normal. No neck masses or thyromegaly noted. Cardiac exam with regular rhythm normal S1 and S2. No murmur or gallop noted. Lungs are clear to auscultation. Abdomen is full but soft and  nontender without organomegaly or masses.  No LE edema or clubbing noted.  Labs/studies Results;  Lab data from 08/18/2013. Serum iron 26, TIBC 452 and saturation 6% Serum ferritin was 9. Hemoccult x3 negative     Assessment:  #1. IDA secondary to impaired iron absorption. She is on chronic PPI therapy. She has history of peptic ulcer disease with bleeding in the past for 3 Hemoccults are negative. Therefore I do not believe she needs to undergo EGD or colonoscopy. Will request records from [facility] to make sure she is up-to-date on colonoscopies. #2. GERD. Symptoms are well-controlled with therapy. #3. History of IBS. She has occasional diarrhea and urgency.   Plan:  Begin ferrous sulfate 325 mg by mouth daily. H&H next week as planned. Once again patient reminded she should not take NSAIDs. She will call if she experiences melena or rectal bleeding. Office visit in 3 months.

## 2013-09-06 NOTE — Patient Instructions (Signed)
Hemoglobin and hematocrit next week as planned.

## 2013-09-15 ENCOUNTER — Encounter (HOSPITAL_COMMUNITY)
Admission: RE | Admit: 2013-09-15 | Discharge: 2013-09-15 | Disposition: A | Discharge status: Home / Self Care | Payer: Medicare Other | Source: Ambulatory Visit | Attending: Internal Medicine | Admitting: Internal Medicine

## 2013-09-15 LAB — HEMOGLOBIN AND HEMATOCRIT, BLOOD
HEMATOCRIT: 33.8 % — AB (ref 36.0–46.0)
Hemoglobin: 11 g/dL — ABNORMAL LOW (ref 12.0–15.0)

## 2013-09-15 NOTE — Progress Notes (Signed)
Results for Rebecca Hartman, Rebecca Hartman (MRN [id number]) as of 09/15/2013 12:45  Ref. Range 09/15/2013 10:12  Hemoglobin Latest Range: 12.0-15.0 g/dL 11.0 (L)  HCT Latest Range: 36.0-46.0 % 33.8 (L)  2week ... Post Feraheme infusion

## 2013-09-19 ENCOUNTER — Telehealth (INDEPENDENT_AMBULATORY_CARE_PROVIDER_SITE_OTHER): Payer: Self-pay | Admitting: *Deleted

## 2013-09-19 ENCOUNTER — Other Ambulatory Visit (INDEPENDENT_AMBULATORY_CARE_PROVIDER_SITE_OTHER): Payer: Self-pay | Admitting: *Deleted

## 2013-09-19 ENCOUNTER — Encounter (INDEPENDENT_AMBULATORY_CARE_PROVIDER_SITE_OTHER): Payer: Self-pay | Admitting: *Deleted

## 2013-09-19 DIAGNOSIS — D509 Iron deficiency anemia, unspecified: Secondary | ICD-10-CM

## 2013-09-19 NOTE — Telephone Encounter (Signed)
Per Dr.Rehman the patient will need to have labs drawn in 1 month. 

## 2013-10-19 LAB — HEMOGLOBIN AND HEMATOCRIT, BLOOD
HCT: 36.9 % (ref 36.0–46.0)
HEMOGLOBIN: 12.3 g/dL (ref 12.0–15.0)

## 2013-11-07 ENCOUNTER — Ambulatory Visit (INDEPENDENT_AMBULATORY_CARE_PROVIDER_SITE_OTHER): Payer: Medicare Other | Admitting: Internal Medicine

## 2013-12-12 ENCOUNTER — Ambulatory Visit (INDEPENDENT_AMBULATORY_CARE_PROVIDER_SITE_OTHER): Payer: Medicare Other | Admitting: Internal Medicine

## 2014-01-17 ENCOUNTER — Encounter (INDEPENDENT_AMBULATORY_CARE_PROVIDER_SITE_OTHER): Payer: Self-pay | Admitting: Internal Medicine

## 2014-01-17 ENCOUNTER — Ambulatory Visit (INDEPENDENT_AMBULATORY_CARE_PROVIDER_SITE_OTHER): Payer: Medicare Other | Admitting: Internal Medicine

## 2014-01-17 VITALS — BP 116/78 | HR 74 | Temp 98.0°F | Resp 18 | Ht 64.0 in | Wt 174.2 lb

## 2014-01-17 DIAGNOSIS — K2100 Gastro-esophageal reflux disease with esophagitis, without bleeding: Secondary | ICD-10-CM

## 2014-01-17 DIAGNOSIS — D509 Iron deficiency anemia, unspecified: Secondary | ICD-10-CM

## 2014-01-17 NOTE — Progress Notes (Signed)
Presenting complaint;  Followup for GERD, IBS and iron deficiency anemia.  Database;  Patient is 68 year old Caucasian female with multiple medical problems including complicated history of peptic ulcer disease, erosive reflux esophagitis and IBS who was last seen in June 2015. 3 Hemoccults were negative. She denied history of melena rectal or vaginal bleeding the IDA was felt to be secondary to impaired items option due to chronic PPI therapy. She was begun on ferrous sulfate and H&H 3 months ago was normal at 12.3 and 36.9. Last colonoscopy was at [facility] but 5 years ago. Recent EGD was in January 2014 revealing noncritical Schatzki's ring small sliding hiatal hernia pyloric channel inflammation but was patent. Stricture and duodenal angle with circumferential ulceration and the stricture was dilated to 15 mm. She now returns for followup visit.   Subjective;  Patient presents with her husband. She has no complaints. She rarely has heartburn and she denies dysphagia. She has very good appetite. She has lost 7 pounds since her last visit she says her weight loss occurred during the month of August while she was having dental problems. She denies nausea vomiting melena or rectal bleeding. She has occasional diarrhea for which she takes loperamide. She has chronic low back pain which flares up intermittently but she knows she cannot take NSAIDs.   she stopped taking ferrous sulfate because of abdominal pain.  lately she's been having retrosternal pain radiating to left shoulder and arm. She has mild shortness of breath. Pain usually occurs in the evening. She has an appointment with Dr. Johnny Bridge for further evaluation    Current Medications: Outpatient Encounter Prescriptions as of 01/17/2014  Medication Sig  . diltiazem (CARDIZEM) 120 MG tablet Take 120 mg by mouth 2 (two) times daily.   Marland Kitchen estradiol (ESTRACE) 0.5 MG tablet Take 0.5 mg by mouth daily.  . fluconazole (DIFLUCAN) 100 MG tablet  Take 100 mg by mouth as needed.   . furosemide (LASIX) 40 MG tablet Take 40 mg by mouth as needed.   . gabapentin (NEURONTIN) 600 MG tablet Take 800 mg by mouth 2 (two) times daily.   . insulin detemir (LEVEMIR) 100 UNIT/ML injection Inject 15 Units into the skin at bedtime.   Marland Kitchen levothyroxine (SYNTHROID, LEVOTHROID) 50 MCG tablet Take 50 mcg by mouth daily.    Marland Kitchen loperamide (IMODIUM) 2 MG capsule Take 2 mg by mouth as needed.  Marland Kitchen LORazepam (ATIVAN) 1 MG tablet Take 1 mg by mouth 2 (two) times daily. For sleep  . metoprolol (LOPRESSOR) 50 MG tablet Take 50 mg by mouth daily.   Marland Kitchen NITROSTAT 0.4 MG SL tablet Place 0.4 mg under the tongue every 5 (five) minutes as needed for chest pain.   . pantoprazole (PROTONIX) 40 MG tablet Take 1 tablet (40 mg total) by mouth daily.  . pravastatin (PRAVACHOL) 20 MG tablet Take 20 mg by mouth daily.   Marland Kitchen Propylene Glycol (SYSTANE BALANCE OP) Apply 2 drops to eye daily as needed. FOR DRY EYES    . pseudoephedrine-guaifenesin (MUCINEX D) 60-600 MG per tablet Take 1 tablet by mouth as needed.  . valsartan (DIOVAN) 160 MG tablet Take 160 mg by mouth daily.   . vitamin C (ASCORBIC ACID) 500 MG tablet Take 500 mg by mouth daily.  Marland Kitchen estradiol (ESTRACE) 0.5 MG tablet Take 1 tablet (0.5 mg total) by mouth daily.  . [DISCONTINUED] ferrous sulfate 325 (65 FE) MG tablet Take 1 tablet (325 mg total) by mouth daily with breakfast.  . [DISCONTINUED]  predniSONE (DELTASONE) 10 MG tablet Take 10 mg by mouth daily with breakfast.  . [DISCONTINUED] sulfamethoxazole-trimethoprim (BACTRIM DS) 800-160 MG per tablet Take 1 tablet by mouth 2 (two) times daily.   . [DISCONTINUED] temazepam (RESTORIL) 30 MG capsule Take 30 mg by mouth at bedtime.      Objective: Blood pressure 116/78, pulse 74, temperature 98 F (36.7 C), temperature source Oral, resp. rate 18, height 5\' 4"  (1.626 m), weight 174 lb 3.2 oz (79.017 kg).  patient is alert and in no acute distress. She has round  facies. Conjunctiva is pink. Sclera is nonicteric Oropharyngeal mucosa is normal. No neck masses or thyromegaly noted. Cardiac exam with regular rhythm normal S1 and S2. Faint systolic ejection murmur heard at LLSB aortic area. Lungs are clear to auscultation. Abdomen is full. Bowel sounds are normal. On palpation abdomen is soft and nontender. Left lobe disease is palpable in the epigastric region but not tender.  No LE edema or clubbing noted.  Labs/studies Results: H&H was 11 and 33.8 on 09/15/2013  H&H is 12.3 and 36.9 on 10/19/2013.   Assessment:  #1.IDA. H&H has returned to normal with iron supplement. She did not tolerate ferrous sulfate. I am afraid H&H would drop again given that she is on PPI chronically. She has history of GI bleed secondary to peptic ulcer disease but he has not bled recently and her Hemoccults were negative.  #2. GERD. Heartburn is well controlled with therapy.  #3. IBS. She is having sporadic diarrhea easily controlled with loperamide. #4. Chest pain. Cardiology evaluation is pending. I doubt GI etiology given that GERD symptoms are well controlled. If cardiac workup is negative we will consider EGD.   Plan:  Flint stones with iron 2 tablets daily. CBC, serum iron, TIBC and ferritin in 8 weeks. Office visit in one year.

## 2014-01-17 NOTE — Patient Instructions (Addendum)
Flintstones with iron; chew 2 tablets daily; Next blood work on 03/10/2014

## 2014-01-26 ENCOUNTER — Ambulatory Visit (INDEPENDENT_AMBULATORY_CARE_PROVIDER_SITE_OTHER): Payer: Medicare Other | Admitting: Cardiology

## 2014-01-26 ENCOUNTER — Encounter: Payer: Self-pay | Admitting: Cardiology

## 2014-01-26 VITALS — BP 124/82 | HR 80 | Ht 63.0 in | Wt 178.0 lb

## 2014-01-26 DIAGNOSIS — I10 Essential (primary) hypertension: Secondary | ICD-10-CM

## 2014-01-26 DIAGNOSIS — R072 Precordial pain: Secondary | ICD-10-CM

## 2014-01-26 DIAGNOSIS — E1159 Type 2 diabetes mellitus with other circulatory complications: Secondary | ICD-10-CM

## 2014-01-26 DIAGNOSIS — E785 Hyperlipidemia, unspecified: Secondary | ICD-10-CM

## 2014-01-26 NOTE — Patient Instructions (Addendum)
Your physician recommends that you schedule a follow-up appointment in: 3 weeks    Your physician recommends that you continue on your current medications as directed. Please refer to the Current Medication list given to you today.      Your physician has requested that you have a lexiscan myoview. For further information please visit [email or web address]. Please follow instruction sheet, as given.                Thank you for choosing [facility] !

## 2014-01-26 NOTE — Assessment & Plan Note (Signed)
Both typical and atypical features for angina. Cardiac risk factors include hypertension, type 2 diabetes mellitus, hyperlipidemia, and family history of CAD. Resting ECG is normal. Plan to proceed with a Lexiscan Cardiolite for further objective ischemic risk assessment. We will plan to bring her back to the office to discuss the results. It will be important to clarify with Dr. Laural Golden whether in fact the patient can take aspirin or other antiplatelet agents at all, even on a limited basis. This will be important to know if she were ever to need a percutaneous coronary intervention.

## 2014-01-26 NOTE — Assessment & Plan Note (Signed)
Good blood pressure control today. No changes made to current regimen. She is on ARB, beta blocker, and calcium channel blocker.

## 2014-01-26 NOTE — Progress Notes (Signed)
Reason for visit: Chest pain  Clinical Summary Ms. Castrillo is a 68 y.o.female referred for cardiology consultation by Dr. Luan Pulling. She is here with her husband today. She reports an intense pressure-like discomfort in her chest with radiation to the left shoulder and mid arm, has been occurring sporadically over the last 3 months at least. There does not seem to be any clear trigger for these symptoms, not specifically with exertion. She feels somewhat better after she takes her metoprolol dose in the morning, has been given nitroglycerin recently by Dr. Luan Pulling. She has taken this twice with improvement in symptoms.  ECG today is normal. She has not undergone any prior ischemic cardiac testing. She is limited by hip and back pain, has had surgery in both regions over the years.  Also states she has had significant gastrointestinal problems including recurrent GI hemorrhage. She tells me that Dr. Laural Golden has told her that she should never take aspirin or NSAIDs.  She reports compliance with her medications. She has had no palpitations, dizziness, or syncope with her chest discomfort.   Allergies  Allergen Reactions  . Shellfish Allergy Itching and Nausea And Vomiting  . Tussigon [Hydrocodone-Homatropine]     Per patient's husband , this medication caused confusion.  . Hydrocodone Itching  . Glyburide     Sudden Drop in Blood Sugars  . Levofloxacin Itching  . Lisinopril Other (See Comments)    SEVERE DROP IN PRESSURE  . Morphine Itching and Nausea Only  . Pantoprazole Sodium [Pantoprazole Sodium] Nausea And Vomiting    PATIENT IS UNSURE OF REACTION    Current Outpatient Prescriptions  Medication Sig Dispense Refill  . diltiazem (CARDIZEM) 120 MG tablet Take 120 mg by mouth 2 (two) times daily.       Marland Kitchen estradiol (ESTRACE) 0.5 MG tablet Take 1 tablet (0.5 mg total) by mouth daily.  30 tablet  5  . fluconazole (DIFLUCAN) 100 MG tablet Take 100 mg by mouth as needed.       . furosemide  (LASIX) 40 MG tablet Take 40 mg by mouth as needed.       . gabapentin (NEURONTIN) 600 MG tablet Take 800 mg by mouth 2 (two) times daily.       . insulin detemir (LEVEMIR) 100 UNIT/ML injection Inject 15 Units into the skin at bedtime.       Marland Kitchen levothyroxine (SYNTHROID, LEVOTHROID) 50 MCG tablet Take 50 mcg by mouth daily.        Marland Kitchen loperamide (IMODIUM) 2 MG capsule Take 2 mg by mouth as needed.      Marland Kitchen LORazepam (ATIVAN) 1 MG tablet Take 1 mg by mouth 2 (two) times daily. For sleep      . metoprolol (LOPRESSOR) 50 MG tablet Take 50 mg by mouth daily.       Marland Kitchen NITROSTAT 0.4 MG SL tablet Place 0.4 mg under the tongue every 5 (five) minutes as needed for chest pain.       Marland Kitchen ondansetron (ZOFRAN) 4 MG tablet Take 4 mg by mouth every 8 (eight) hours as needed for nausea or vomiting.      . pantoprazole (PROTONIX) 40 MG tablet Take 1 tablet (40 mg total) by mouth daily.  90 tablet  3  . pravastatin (PRAVACHOL) 20 MG tablet Take 20 mg by mouth daily.       Marland Kitchen Propylene Glycol (SYSTANE BALANCE OP) Apply 2 drops to eye daily as needed. FOR DRY EYES        .  pseudoephedrine-guaifenesin (MUCINEX D) 60-600 MG per tablet Take 1 tablet by mouth as needed.      . temazepam (RESTORIL) 15 MG capsule Take 15 mg by mouth at bedtime as needed for sleep.      . valsartan (DIOVAN) 160 MG tablet Take 160 mg by mouth daily.       . vitamin C (ASCORBIC ACID) 500 MG tablet Take 500 mg by mouth daily.       No current facility-administered medications for this visit.    Past Medical History  Diagnosis Date  . Essential hypertension   . Type 2 diabetes mellitus   . Hypothyroidism   . Fibromyalgia   . Chronic back pain   . Anemia   . Hiatal hernia   . Anxiety   . Hypercholesteremia     Past Surgical History  Procedure Laterality Date  . Tonsillectomy    . Cholecystectomy    . Carpal tunnel release    . Back surgery    . Total hip revision    . Abdominal hysterectomy    . Esophagogastroduodenoscopy  10/31/2010     Procedure: ESOPHAGOGASTRODUODENOSCOPY (EGD);  Surgeon: [name], MD;  [facility]: [facility];  Service: Endoscopy;  Laterality: N/A;  . Colonoscopy  08/06/06 FIELDS  . Upper gastrointestinal endoscopy  10/03/2010    EGD DILATN PYLORIC CHANNEL  . Upper gastrointestinal endoscopy  07/08/2010  . Upper gastrointestinal endoscopy  08/06/06    FIELDS  . Upper gastrointestinal endoscopy  06/29/2008    EGD  . Esophagogastroduodenoscopy  03/05/2011    Procedure: ESOPHAGOGASTRODUODENOSCOPY (EGD);  Surgeon: [name], MD;  [facility]: [facility];  Service: Endoscopy;  Laterality: N/A;  12:00  . Esophagogastroduodenoscopy  04/28/2012    Procedure: ESOPHAGOGASTRODUODENOSCOPY (EGD);  Surgeon: [name], MD;  [facility]: [facility];  Service: Endoscopy;  Laterality: N/A;  140-moved to 10:30 Ann notified pt    Family History  Problem Relation Age of Onset  . Diabetes Mother   . Stroke Mother   . Stroke Father   . Healthy Son     Social History Ms. Choy reports that she has never smoked. She has never used smokeless tobacco. Ms. Chacko reports that she does not drink alcohol.  Review of Systems Complete review of systems negative except as otherwise outlined in the clinical summary and also the following. No obvious melena or hematochezia. Stable appetite. Arthritic pains.  Physical Examination Filed Vitals:   01/26/14 0833  BP: 124/82  Pulse: 80   Filed Weights   01/26/14 0833  Weight: 178 lb (80.74 kg)   Overweight woman, appears comfortable at rest. HEENT: Conjunctiva and lids normal, oropharynx clear. Neck: Supple, no elevated JVP or carotid bruits, no thyromegaly. Lungs: Clear to auscultation, nonlabored breathing at rest. Cardiac: Regular rate and rhythm, no S3, 2/6 systolic murmur at base, no pericardial rub. Abdomen: Soft, nontender, bowel sounds present, no guarding or rebound. Extremities: No pitting edema, distal pulses 2+. Skin: Warm and  dry. Musculoskeletal: No kyphosis. Neuropsychiatric: Alert and oriented x3, affect grossly appropriate.   Problem List and Plan   Precordial pain Both typical and atypical features for angina. Cardiac risk factors include hypertension, type 2 diabetes mellitus, hyperlipidemia, and family history of CAD. Resting ECG is normal. Plan to proceed with a Lexiscan Cardiolite for further objective ischemic risk assessment. We will plan to bring her back to the office to discuss the results. It will be important to clarify with Dr. Laural Golden whether in  fact the patient can take aspirin or other antiplatelet agents at all, even on a limited basis. This will be important to know if she were ever to need a percutaneous coronary intervention.  Hyperlipidemia On Pravachol, lipids been followed by Dr. Luan Pulling.  Essential hypertension Good blood pressure control today. No changes made to current regimen. She is on ARB, beta blocker, and calcium channel blocker.  Diabetes mellitus, type 2 Followed by Dr. Luan Pulling.    Satira Sark, M.D., F.A.C.C.

## 2014-01-26 NOTE — Assessment & Plan Note (Signed)
Followed by Dr. Hawkins. 

## 2014-01-26 NOTE — Assessment & Plan Note (Signed)
On Pravachol, lipids been followed by Dr. Luan Pulling.

## 2014-02-02 ENCOUNTER — Telehealth: Payer: Self-pay

## 2014-02-02 NOTE — Telephone Encounter (Signed)
Can you please copy the note below and make it part of the medical record so that I can refer to it later. Thanks.         ----- Message -----     From: [name], MD     Sent: 02/01/2014  4:10 PM      To: Satira Sark, MD        Rebecca Hartman has had complicated peptic ulcer disease( GI bleed and pyloroduodenal stenosis)secondary to NSAID use. Her ulcers have healed.    I do not see any problems with low-dose aspirin. If she needs to be on another agent along with aspirin, we will have to monitor closely. Hopefully combination therapy would be only for 12 months.I do not feel that there is absolute contraindication to use of antiplatelet agents.    Najeeb.    ----- Message -----     From: Satira Sark, MD     Sent: 01/26/2014  9:19 AM      To: [name], MD        I just saw Rebecca Hartman today in referral from Dr. Luan Pulling. Question to you is whether she would ever be able to take aspirin or even dual antiplatelet therapy. She tells me that she has been told that she cannot. I am not sending her for a heart catheterization at this time, but need to know this information in case the situation changes in the future. We would not be able to do a PCI unless she is on antiplatelet agents. Thanks.

## 2014-02-06 ENCOUNTER — Encounter (HOSPITAL_COMMUNITY): Payer: Self-pay

## 2014-02-06 ENCOUNTER — Encounter (HOSPITAL_COMMUNITY)
Admission: RE | Admit: 2014-02-06 | Discharge: 2014-02-06 | Disposition: A | Discharge status: Home / Self Care | Payer: Medicare Other | Source: Ambulatory Visit | Attending: Cardiology | Admitting: Cardiology

## 2014-02-06 DIAGNOSIS — R072 Precordial pain: Secondary | ICD-10-CM | POA: Insufficient documentation

## 2014-02-06 DIAGNOSIS — R079 Chest pain, unspecified: Secondary | ICD-10-CM

## 2014-02-06 MED ORDER — REGADENOSON 0.4 MG/5ML IV SOLN
INTRAVENOUS | Status: AC
  Administered 2014-02-06: 0.4 mg via INTRAVENOUS
  Filled 2014-02-06: qty 5

## 2014-02-06 MED ORDER — TECHNETIUM TC 99M SESTAMIBI GENERIC - CARDIOLITE
10.0000 | Freq: Once | INTRAVENOUS | Status: AC | PRN
Start: 2014-02-06 — End: 2014-02-06
  Administered 2014-02-06: 10 via INTRAVENOUS

## 2014-02-06 MED ORDER — REGADENOSON 0.4 MG/5ML IV SOLN
0.4000 mg | Freq: Once | INTRAVENOUS | Status: AC | PRN
  Administered 2014-02-06: 0.4 mg via INTRAVENOUS

## 2014-02-06 MED ORDER — SODIUM CHLORIDE 0.9 % IJ SOLN
INTRAMUSCULAR | Status: AC
  Administered 2014-02-06: 10 mL via INTRAVENOUS
  Filled 2014-02-06: qty 10

## 2014-02-06 MED ORDER — TECHNETIUM TC 99M SESTAMIBI - CARDIOLITE
30.0000 | Freq: Once | INTRAVENOUS | Status: AC | PRN
  Administered 2014-02-06: 11:00:00 30 via INTRAVENOUS

## 2014-02-06 MED ORDER — SODIUM CHLORIDE 0.9 % IJ SOLN
10.0000 mL | INTRAMUSCULAR | Status: DC | PRN
  Administered 2014-02-06: 10 mL via INTRAVENOUS
  Filled 2014-02-06: qty 10

## 2014-02-06 NOTE — Progress Notes (Signed)
Stress Lab Nurses Notes - [facility]  SHIRLINE KENDLE 02/06/2014 Reason for doing test: Chest Pain and Left shoulder & left mid arm Type of test: Wille Glaser Nurse performing test: Gerrit Halls, RN Nuclear Medicine Tech: Redmond Baseman Echo Tech: Not Applicable MD performing test: McDowell/K.Purcell Nails NP Family MD: Luan Pulling Test explained and consent signed: Yes.   IV started: Saline lock flushed, No redness or edema and Saline lock started in radiology Symptoms: weakness all over Treatment/Intervention: None Reason test stopped: protocol completed After recovery IV was: Discontinued via X-ray tech and No redness or edema Patient to return to Nuc. Med at :12:30 Patient discharged: Home Patient's Condition upon discharge was: stable Comments: During test BP 121/71 & HR 78.  Recovery BP 102/64 & HR 72. Symptoms resolved in recovery. Geanie Cooley T

## 2014-02-09 ENCOUNTER — Encounter: Payer: Self-pay | Admitting: Cardiology

## 2014-02-09 ENCOUNTER — Ambulatory Visit (INDEPENDENT_AMBULATORY_CARE_PROVIDER_SITE_OTHER): Payer: Medicare Other | Admitting: Cardiology

## 2014-02-09 VITALS — BP 148/80 | HR 77 | Ht 64.0 in | Wt 179.0 lb

## 2014-02-09 DIAGNOSIS — I10 Essential (primary) hypertension: Secondary | ICD-10-CM

## 2014-02-09 DIAGNOSIS — E785 Hyperlipidemia, unspecified: Secondary | ICD-10-CM

## 2014-02-09 DIAGNOSIS — R9439 Abnormal result of other cardiovascular function study: Secondary | ICD-10-CM

## 2014-02-09 MED ORDER — ISOSORBIDE MONONITRATE ER 30 MG PO TB24: 30.0000 mg | ORAL_TABLET | Freq: Every day | ORAL | Status: DC

## 2014-02-09 NOTE — Assessment & Plan Note (Signed)
Continues on Pravachol.

## 2014-02-09 NOTE — Progress Notes (Signed)
Reason for visit: Follow-up testing  Clinical Summary Ms. Whiteside is a 68 y.o.female seen recently in consultation in late October with chest pain symptoms that were both typical and atypical for angina in the setting of hyperlipidemia, hypertension, and type 2 diabetes mellitus. She was referred for stress testing.  Lexiscan Cardiolite from November 9 showed an area of mid to apical anteroseptal ischemia with normal wall motion and LVEF 72%, overall intermediate risk study. Today I discussed this with the patient and her husband. Possible that she has an LAD stenosis. We discussed diagnostic heart catheterization to clarify this and assess for revascularization options. At this point, she was hesitant to proceed and preferred medical therapy. We reviewed her medications and discussed adding Imdur.  She did mention to me at the last visit being told that she should not take aspirin by Dr. Laural Golden. She does have a previous history of recurrent GI hemorrhage. I communicated with Dr. Laural Golden about her history of GI bleeding and pyloroduodenal stenosis due to previous NSAID use. He indicated that her ulcers had healed, and that low-dose aspirin could be used at this time. Use of DAPT would require close monitoring, however would not be absolutely contraindicated.  She does not report any escalating symptoms, tends to have these mainly in the evening and night time. Some of it sounds GI, but still concerning for angina as she has improvement with a single nitroglycerin at times.  Allergies  Allergen Reactions  . Shellfish Allergy Itching and Nausea And Vomiting  . Tussigon [Hydrocodone-Homatropine]     Per patient's husband , this medication caused confusion.  . Hydrocodone Itching  . Glyburide     Sudden Drop in Blood Sugars  . Levofloxacin Itching  . Lisinopril Other (See Comments)    SEVERE DROP IN PRESSURE  . Morphine Itching and Nausea Only  . Pantoprazole Sodium [Pantoprazole Sodium] Nausea  And Vomiting    PATIENT IS UNSURE OF REACTION    Current Outpatient Prescriptions  Medication Sig Dispense Refill  . diltiazem (CARDIZEM) 120 MG tablet Take 120 mg by mouth 2 (two) times daily.     . fluconazole (DIFLUCAN) 100 MG tablet Take 100 mg by mouth as needed.     . furosemide (LASIX) 40 MG tablet Take 40 mg by mouth as needed.     . gabapentin (NEURONTIN) 600 MG tablet Take 800 mg by mouth 2 (two) times daily.     . insulin detemir (LEVEMIR) 100 UNIT/ML injection Inject 15 Units into the skin at bedtime.     Marland Kitchen levothyroxine (SYNTHROID, LEVOTHROID) 50 MCG tablet Take 50 mcg by mouth daily.      Marland Kitchen loperamide (IMODIUM) 2 MG capsule Take 2 mg by mouth as needed.    Marland Kitchen LORazepam (ATIVAN) 1 MG tablet Take 1 mg by mouth 2 (two) times daily. For sleep    . metoprolol (LOPRESSOR) 50 MG tablet Take 50 mg by mouth daily.     Marland Kitchen NITROSTAT 0.4 MG SL tablet Place 0.4 mg under the tongue every 5 (five) minutes as needed for chest pain.     Marland Kitchen ondansetron (ZOFRAN) 4 MG tablet Take 4 mg by mouth every 8 (eight) hours as needed for nausea or vomiting.    . pantoprazole (PROTONIX) 40 MG tablet Take 1 tablet (40 mg total) by mouth daily. 90 tablet 3  . pravastatin (PRAVACHOL) 20 MG tablet Take 20 mg by mouth daily.     Marland Kitchen Propylene Glycol (SYSTANE BALANCE OP) Apply 2 drops  to eye daily as needed. FOR DRY EYES      . pseudoephedrine-guaifenesin (MUCINEX D) 60-600 MG per tablet Take 1 tablet by mouth as needed.    . temazepam (RESTORIL) 15 MG capsule Take 15 mg by mouth at bedtime as needed for sleep.    . valsartan (DIOVAN) 160 MG tablet Take 160 mg by mouth daily.     . vitamin C (ASCORBIC ACID) 500 MG tablet Take 500 mg by mouth daily.    Marland Kitchen estradiol (ESTRACE) 0.5 MG tablet Take 1 tablet (0.5 mg total) by mouth daily. 30 tablet 5  . isosorbide mononitrate (IMDUR) 30 MG 24 hr tablet Take 1 tablet (30 mg total) by mouth daily. 90 tablet 3   No current facility-administered medications for this visit.     Past Medical History  Diagnosis Date  . Essential hypertension   . Type 2 diabetes mellitus   . Hypothyroidism   . Fibromyalgia   . Chronic back pain   . Anemia   . Hiatal hernia   . Anxiety   . Hypercholesteremia     Past Surgical History  Procedure Laterality Date  . Tonsillectomy    . Cholecystectomy    . Carpal tunnel release    . Back surgery    . Total hip revision    . Abdominal hysterectomy    . Esophagogastroduodenoscopy  10/31/2010    Procedure: ESOPHAGOGASTRODUODENOSCOPY (EGD);  Surgeon: [name], MD;  [facility]: [facility];  Service: Endoscopy;  Laterality: N/A;  . Colonoscopy  08/06/06 FIELDS  . Upper gastrointestinal endoscopy  10/03/2010    EGD DILATN PYLORIC CHANNEL  . Upper gastrointestinal endoscopy  07/08/2010  . Upper gastrointestinal endoscopy  08/06/06    FIELDS  . Upper gastrointestinal endoscopy  06/29/2008    EGD  . Esophagogastroduodenoscopy  03/05/2011    Procedure: ESOPHAGOGASTRODUODENOSCOPY (EGD);  Surgeon: [name], MD;  [facility];  Service: Endoscopy;  Laterality: N/A;  12:00  . Esophagogastroduodenoscopy  04/28/2012    Procedure: ESOPHAGOGASTRODUODENOSCOPY (EGD);  Surgeon: [name], MD;  [facility];  Service: Endoscopy;  Laterality: N/A;  140-moved to 10:30 Ann notified pt    Family History  Problem Relation Age of Onset  . Diabetes Mother   . Stroke Mother   . Stroke Father   . Healthy Son     Social History Ms. Grobe reports that she has never smoked. She has never used smokeless tobacco. Ms. Sine reports that she does not drink alcohol.  Review of Systems Complete review of systems negative except as otherwise outlined in the clinical summary.  Physical Examination Filed Vitals:   02/09/14 1253  BP: 148/80  Pulse: 77   Filed Weights   02/09/14 1253  Weight: 179 lb (81.194 kg)    Overweight woman, appears comfortable at rest. HEENT: Conjunctiva and lids normal,  oropharynx clear. Neck: Supple, no elevated JVP or carotid bruits, no thyromegaly. Lungs: Clear to auscultation, nonlabored breathing at rest. Cardiac: Regular rate and rhythm, no S3, 2/6 systolic murmur at base, no pericardial rub. Abdomen: Soft, nontender, bowel sounds present, no guarding or rebound. Extremities: No pitting edema, distal pulses 2+. Skin: Warm and dry. Musculoskeletal: No kyphosis. Neuropsychiatric: Alert and oriented x3, affect grossly appropriate.   Problem List and Plan   Abnormal myocardial perfusion study Recent Cardiolite discussed above. I offered proceeding with diagnostic heart catheterization to clarify coronary anatomy and determine if any revascularization options are needed. At this point she  wanted to hold off. We will continue medical therapy and add Imdur 30 mg in the evening. Plan to bring her back to the office over the next 6 weeks. I recommended that if her symptoms escalate she should be seen sooner and consider invasive testing. As noted above, Dr. Laural Golden did clarify that the patient could take aspirin, and in fact there is not an absolute contraindication for using DAPT if necessary, although the patient would have to be followed closely.  Hyperlipidemia Continues on Pravachol.  Essential hypertension No other change to baseline medical regimen. We are adding Imdur as antianginal.    Satira Sark, M.D., F.A.C.C.

## 2014-02-09 NOTE — Assessment & Plan Note (Signed)
Recent Cardiolite discussed above. I offered proceeding with diagnostic heart catheterization to clarify coronary anatomy and determine if any revascularization options are needed. At this point she wanted to hold off. We will continue medical therapy and add Imdur 30 mg in the evening. Plan to bring her back to the office over the next 6 weeks. I recommended that if her symptoms escalate she should be seen sooner and consider invasive testing. As noted above, Dr. Laural Golden did clarify that the patient could take aspirin, and in fact there is not an absolute contraindication for using DAPT if necessary, although the patient would have to be followed closely.

## 2014-02-09 NOTE — Assessment & Plan Note (Signed)
No other change to baseline medical regimen. We are adding Imdur as antianginal.

## 2014-02-09 NOTE — Patient Instructions (Signed)
Your physician recommends that you schedule a follow-up appointment in: 6 weeks with Dr. Domenic Polite  Your physician has recommended you make the following change in your medication:   IMDUR 30 MG DAILY AT NIGHT  CONTINUE ALL OTHER MEDICATIONS  Thank you for choosing [facility]!!

## 2014-02-13 ENCOUNTER — Ambulatory Visit: Payer: Medicare Other | Admitting: Cardiology

## 2014-03-16 LAB — HEMOGLOBIN A1C: Hemoglobin A1C: 6.9

## 2014-03-17 ENCOUNTER — Ambulatory Visit (INDEPENDENT_AMBULATORY_CARE_PROVIDER_SITE_OTHER): Payer: Medicare Other | Admitting: Cardiology

## 2014-03-17 ENCOUNTER — Encounter: Payer: Self-pay | Admitting: Cardiology

## 2014-03-17 VITALS — BP 140/88 | HR 73 | Ht 64.0 in | Wt 176.2 lb

## 2014-03-17 DIAGNOSIS — I10 Essential (primary) hypertension: Secondary | ICD-10-CM

## 2014-03-17 DIAGNOSIS — R9439 Abnormal result of other cardiovascular function study: Secondary | ICD-10-CM

## 2014-03-17 NOTE — Assessment & Plan Note (Signed)
No change in current regimen, keep follow-up with Dr. Luan Pulling.

## 2014-03-17 NOTE — Assessment & Plan Note (Signed)
Recent Cardiolite showing an area of mid to apical anteroseptal ischemia with normal wall motion and LVEF 72%, overall intermediate risk study. We have discussed diagnostic heart catheterization, but she prefers to hold off on invasive testing at this point, and fortunately he has had good control of angina symptoms with addition of Imdur to her medical regimen. We will continue observation unless her symptoms worsen or she prefers to proceed with heart catheterization. Follow-up arranged.

## 2014-03-17 NOTE — Patient Instructions (Signed)
Your physician recommends that you schedule a follow-up appointment in: 4 months with Dr. Domenic Polite  Your physician recommends that you continue on your current medications as directed. Please refer to the Current Medication list given to you today.  Thank you for choosing [facility]!

## 2014-03-17 NOTE — Progress Notes (Signed)
Reason for visit: Follow-up chest pain  Clinical Summary Rebecca Hartman is a 68 y.o.female seen recently in November. At that point medical therapy was modified to address chest pain symptoms with abnormal Cardiolite study as detailed in the previous note. She preferred to hold off on heart catheterization at that time.  She comes in today for a routine visit, states that she has had no chest pain symptoms, taking her Imdur around her evening meal time. Otherwise no change in her medications. She remains comfortable with observation at this time.  She does have a previous history of recurrent GI hemorrhage. I communicated with Dr. Laural Golden about her history of GI bleeding and pyloroduodenal stenosis due to previous NSAID use. He indicated that her ulcers had healed, and that low-dose aspirin could be used at this time. Use of DAPT would require close monitoring, however would not be absolutely contraindicated.  Allergies  Allergen Reactions  . Shellfish Allergy Itching and Nausea And Vomiting  . Tussigon [Hydrocodone-Homatropine]     Per patient's husband , this medication caused confusion.  . Hydrocodone Itching  . Glyburide     Sudden Drop in Blood Sugars  . Levofloxacin Itching  . Lisinopril Other (See Comments)    SEVERE DROP IN PRESSURE  . Morphine Itching and Nausea Only  . Pantoprazole Sodium [Pantoprazole Sodium] Nausea And Vomiting    PATIENT IS UNSURE OF REACTION    Current Outpatient Prescriptions  Medication Sig Dispense Refill  . clindamycin (CLEOCIN) 150 MG capsule Take by mouth 4 (four) times daily.    Marland Kitchen diltiazem (CARDIZEM) 120 MG tablet Take 120 mg by mouth 2 (two) times daily.     . fluconazole (DIFLUCAN) 100 MG tablet Take 100 mg by mouth as needed.     . furosemide (LASIX) 40 MG tablet Take 40 mg by mouth as needed.     . gabapentin (NEURONTIN) 600 MG tablet Take 800 mg by mouth 2 (two) times daily.     . insulin detemir (LEVEMIR) 100 UNIT/ML injection Inject 15  Units into the skin at bedtime.     . isosorbide mononitrate (IMDUR) 30 MG 24 hr tablet Take 1 tablet (30 mg total) by mouth daily. 90 tablet 3  . levothyroxine (SYNTHROID, LEVOTHROID) 50 MCG tablet Take 50 mcg by mouth daily.      Marland Kitchen loperamide (IMODIUM) 2 MG capsule Take 2 mg by mouth as needed.    Marland Kitchen LORazepam (ATIVAN) 1 MG tablet Take 1 mg by mouth 2 (two) times daily. For sleep    . metoprolol (LOPRESSOR) 50 MG tablet Take 50 mg by mouth daily.     Marland Kitchen NITROSTAT 0.4 MG SL tablet Place 0.4 mg under the tongue every 5 (five) minutes as needed for chest pain.     Marland Kitchen ondansetron (ZOFRAN) 4 MG tablet Take 4 mg by mouth every 8 (eight) hours as needed for nausea or vomiting.    . pantoprazole (PROTONIX) 40 MG tablet Take 1 tablet (40 mg total) by mouth daily. 90 tablet 3  . pravastatin (PRAVACHOL) 20 MG tablet Take 20 mg by mouth daily.     Marland Kitchen Propylene Glycol (SYSTANE BALANCE OP) Apply 2 drops to eye daily as needed. FOR DRY EYES      . pseudoephedrine-guaifenesin (MUCINEX D) 60-600 MG per tablet Take 1 tablet by mouth as needed.    . temazepam (RESTORIL) 15 MG capsule Take 15 mg by mouth at bedtime as needed for sleep.    Marland Kitchen triamcinolone acetonide 40  MG/ML SUSP 40 mg, mupirocin cream 2 % CREA 15 g Apply 1 application topically. Apply to affected area twice daily    . valsartan (DIOVAN) 160 MG tablet Take 160 mg by mouth daily.     . vitamin C (ASCORBIC ACID) 500 MG tablet Take 500 mg by mouth daily.    Marland Kitchen estradiol (ESTRACE) 0.5 MG tablet Take 1 tablet (0.5 mg total) by mouth daily. 30 tablet 5   No current facility-administered medications for this visit.    Past Medical History  Diagnosis Date  . Essential hypertension   . Type 2 diabetes mellitus   . Hypothyroidism   . Fibromyalgia   . Chronic back pain   . Anemia   . Hiatal hernia   . Anxiety   . Hypercholesteremia     Past Surgical History  Procedure Laterality Date  . Tonsillectomy    . Cholecystectomy    . Carpal tunnel  release    . Back surgery    . Total hip revision    . Abdominal hysterectomy    . Esophagogastroduodenoscopy  10/31/2010    Procedure: ESOPHAGOGASTRODUODENOSCOPY (EGD);  Surgeon: [name], MD;  [facility]: [facility];  Service: Endoscopy;  Laterality: N/A;  . Colonoscopy  08/06/06 [facility]DS  . Upper gastrointestinal endoscopy  10/03/2010    EGD DILATN PYLORIC CHANNEL  . Upper gastrointestinal endoscopy  07/08/2010  . Upper gastrointestinal endoscopy  08/06/06    FIELDS  . Upper gastrointestinal endoscopy  06/29/2008    EGD  . Esophagogastroduodenoscopy  03/05/2011    Procedure: ESOPHAGOGASTRODUODENOSCOPY (EGD);  Surgeon: [name], MD;  [facility]: [facility];  Service: Endoscopy;  Laterality: N/A;  12:00  . Esophagogastroduodenoscopy  04/28/2012    Procedure: ESOPHAGOGASTRODUODENOSCOPY (EGD);  Surgeon: [name], MD;  [facility]: [facility];  Service: Endoscopy;  Laterality: N/A;  140-moved to 10:30 Ann notified pt    Family History  Problem Relation Age of Onset  . Diabetes Mother   . Stroke Mother   . Stroke Father   . Healthy Son     Social History Rebecca Hartman reports that she has never smoked. She has never used smokeless tobacco. Rebecca Hartman reports that she does not drink alcohol.  Review of Systems Complete review of systems negative except as otherwise outlined in the clinical summary and also the following. Reports recurrent episode of "cellulitis" being managed by Dr. Luan Pulling.  Physical Examination Filed Vitals:   03/17/14 1006  BP: 140/88  Pulse: 73   Filed Weights   03/17/14 1006  Weight: 176 lb 4 oz (79.946 kg)    Overweight woman, appears comfortable at rest. HEENT: Conjunctiva and lids normal, oropharynx clear. Neck: Supple, no elevated JVP or carotid bruits, no thyromegaly. Lungs: Clear to auscultation, nonlabored breathing at rest. Cardiac: Regular rate and rhythm, no S3, 2/6 systolic murmur at base, no pericardial rub. Abdomen: Soft,  nontender, bowel sounds present, no guarding or rebound. Extremities: No pitting edema, distal pulses 2+.   Problem List and Plan   Abnormal myocardial perfusion study Recent Cardiolite showing an area of mid to apical anteroseptal ischemia with normal wall motion and LVEF 72%, overall intermediate risk study. We have discussed diagnostic heart catheterization, but she prefers to hold off on invasive testing at this point, and fortunately he has had good control of angina symptoms with addition of Imdur to her medical regimen. We will continue observation unless her symptoms worsen or she prefers to proceed with heart catheterization. Follow-up  arranged.  Essential hypertension No change in current regimen, keep follow-up with Dr. Luan Pulling.    Satira Sark, M.D., F.A.C.C.

## 2014-03-22 ENCOUNTER — Encounter (INDEPENDENT_AMBULATORY_CARE_PROVIDER_SITE_OTHER): Payer: Self-pay

## 2014-04-03 DIAGNOSIS — R21 Rash and other nonspecific skin eruption: Secondary | ICD-10-CM | POA: Diagnosis not present

## 2014-04-03 DIAGNOSIS — E1165 Type 2 diabetes mellitus with hyperglycemia: Secondary | ICD-10-CM | POA: Diagnosis not present

## 2014-04-03 DIAGNOSIS — E1121 Type 2 diabetes mellitus with diabetic nephropathy: Secondary | ICD-10-CM | POA: Diagnosis not present

## 2014-04-03 DIAGNOSIS — I10 Essential (primary) hypertension: Secondary | ICD-10-CM | POA: Diagnosis not present

## 2014-05-04 ENCOUNTER — Telehealth: Payer: Self-pay | Admitting: Cardiology

## 2014-05-04 NOTE — Telephone Encounter (Signed)
Patient has questions about medications / tgs

## 2014-05-04 NOTE — Telephone Encounter (Signed)
Pt wonders if Imdur can elevate blood sugar and was told no per K.Lawrence NP Pt will follow up with pcp

## 2014-05-16 DIAGNOSIS — E1165 Type 2 diabetes mellitus with hyperglycemia: Secondary | ICD-10-CM | POA: Diagnosis not present

## 2014-05-16 DIAGNOSIS — M7110 Other infective bursitis, unspecified site: Secondary | ICD-10-CM | POA: Diagnosis not present

## 2014-05-16 DIAGNOSIS — I10 Essential (primary) hypertension: Secondary | ICD-10-CM | POA: Diagnosis not present

## 2014-05-23 ENCOUNTER — Other Ambulatory Visit: Payer: Self-pay | Admitting: Dermatology

## 2014-05-23 DIAGNOSIS — L821 Other seborrheic keratosis: Secondary | ICD-10-CM | POA: Diagnosis not present

## 2014-05-23 DIAGNOSIS — D485 Neoplasm of uncertain behavior of skin: Secondary | ICD-10-CM | POA: Diagnosis not present

## 2014-05-23 DIAGNOSIS — L281 Prurigo nodularis: Secondary | ICD-10-CM | POA: Diagnosis not present

## 2014-06-06 DIAGNOSIS — E1165 Type 2 diabetes mellitus with hyperglycemia: Secondary | ICD-10-CM | POA: Diagnosis not present

## 2014-06-06 DIAGNOSIS — E039 Hypothyroidism, unspecified: Secondary | ICD-10-CM | POA: Diagnosis not present

## 2014-06-06 DIAGNOSIS — I10 Essential (primary) hypertension: Secondary | ICD-10-CM | POA: Diagnosis not present

## 2014-06-06 DIAGNOSIS — E782 Mixed hyperlipidemia: Secondary | ICD-10-CM | POA: Diagnosis not present

## 2014-06-06 DIAGNOSIS — E559 Vitamin D deficiency, unspecified: Secondary | ICD-10-CM | POA: Diagnosis not present

## 2014-06-06 DIAGNOSIS — E785 Hyperlipidemia, unspecified: Secondary | ICD-10-CM | POA: Diagnosis not present

## 2014-06-06 DIAGNOSIS — Z713 Dietary counseling and surveillance: Secondary | ICD-10-CM | POA: Diagnosis not present

## 2014-06-13 DIAGNOSIS — E1121 Type 2 diabetes mellitus with diabetic nephropathy: Secondary | ICD-10-CM | POA: Diagnosis not present

## 2014-06-13 DIAGNOSIS — I10 Essential (primary) hypertension: Secondary | ICD-10-CM | POA: Diagnosis not present

## 2014-06-13 DIAGNOSIS — E1165 Type 2 diabetes mellitus with hyperglycemia: Secondary | ICD-10-CM | POA: Diagnosis not present

## 2014-06-13 DIAGNOSIS — R079 Chest pain, unspecified: Secondary | ICD-10-CM | POA: Diagnosis not present

## 2014-06-15 DIAGNOSIS — E6609 Other obesity due to excess calories: Secondary | ICD-10-CM | POA: Diagnosis not present

## 2014-06-15 DIAGNOSIS — I10 Essential (primary) hypertension: Secondary | ICD-10-CM | POA: Diagnosis not present

## 2014-06-15 DIAGNOSIS — E1165 Type 2 diabetes mellitus with hyperglycemia: Secondary | ICD-10-CM | POA: Diagnosis not present

## 2014-06-15 DIAGNOSIS — E039 Hypothyroidism, unspecified: Secondary | ICD-10-CM | POA: Diagnosis not present

## 2014-06-15 DIAGNOSIS — E559 Vitamin D deficiency, unspecified: Secondary | ICD-10-CM | POA: Diagnosis not present

## 2014-06-15 DIAGNOSIS — E785 Hyperlipidemia, unspecified: Secondary | ICD-10-CM | POA: Diagnosis not present

## 2014-07-19 ENCOUNTER — Encounter (INDEPENDENT_AMBULATORY_CARE_PROVIDER_SITE_OTHER): Payer: Self-pay | Admitting: *Deleted

## 2014-07-24 ENCOUNTER — Encounter: Payer: Self-pay | Admitting: Cardiology

## 2014-07-24 ENCOUNTER — Ambulatory Visit (INDEPENDENT_AMBULATORY_CARE_PROVIDER_SITE_OTHER): Payer: Medicare Other | Admitting: Cardiology

## 2014-07-24 VITALS — BP 134/66 | HR 77 | Ht 64.0 in | Wt 177.0 lb

## 2014-07-24 DIAGNOSIS — R9439 Abnormal result of other cardiovascular function study: Secondary | ICD-10-CM

## 2014-07-24 DIAGNOSIS — I10 Essential (primary) hypertension: Secondary | ICD-10-CM

## 2014-07-24 DIAGNOSIS — E1159 Type 2 diabetes mellitus with other circulatory complications: Secondary | ICD-10-CM

## 2014-07-24 NOTE — Patient Instructions (Signed)
Your physician wants you to follow-up in: 4 months with Dr.McDowell You will receive a reminder letter in the mail two months in advance. If you don't receive a letter, please call our office to schedule the follow-up appointment.    Your physician recommends that you continue on your current medications as directed. Please refer to the Current Medication list given to you today.     Thank you for choosing [facility] !    

## 2014-07-24 NOTE — Progress Notes (Signed)
Cardiology Office Note  Date: 07/24/2014   ID: Rebecca Hartman, DOB 1945-12-04, MRN [id number]  PCP: Alonza Bogus, MD  Primary Cardiologist: Rozann Lesches, MD   Chief Complaint  Patient presents with  . Follow-up angina    History of Present Illness: Rebecca Hartman is a 69 y.o. female last seen in December 2015. She has been managed medically with angina symptoms and abnormal Cardiolite as reviewed from November 2015. She has preferred to hold off further invasive cardiac testing. Fortunately, she remains stable, no significant angina symptoms since we started Imdur. She reports walking at a local track for exercise, NYHA class II dyspnea.  We reviewed her medications which are outlined below. She states that she is not able to take a baby aspirin every day reporting intermittently "dark" stools. She has a previous history of GI bleeding, followed by Dr. Laural Golden. Reports intermittent NSAID use due to headaches.  She is following with Dr. Dorris Fetch for diabetes mellitus.   Past Medical History  Diagnosis Date  . Essential hypertension   . Type 2 diabetes mellitus   . Hypothyroidism   . Fibromyalgia   . Chronic back pain   . Anemia   . Hiatal hernia   . Anxiety   . Hypercholesteremia   . Abnormal myocardial perfusion study     November 2015, mid to apical anteroseptal ischemia     Past Surgical History  Procedure Laterality Date  . Tonsillectomy    . Cholecystectomy    . Carpal tunnel release    . Back surgery    . Total hip revision    . Abdominal hysterectomy    . Esophagogastroduodenoscopy  10/31/2010    Procedure: ESOPHAGOGASTRODUODENOSCOPY (EGD);  Surgeon: [name], MD;  [facility];  Service: Endoscopy;  Laterality: N/A;  . Colonoscopy  08/06/06 [facility]  . Upper gastrointestinal endoscopy  10/03/2010    EGD DILATN PYLORIC CHANNEL  . Upper gastrointestinal endoscopy  07/08/2010  . Upper gastrointestinal endoscopy  08/06/06    FIELDS  . Upper  gastrointestinal endoscopy  06/29/2008    EGD  . Esophagogastroduodenoscopy  03/05/2011    Procedure: ESOPHAGOGASTRODUODENOSCOPY (EGD);  Surgeon: [name], MD;  [facility];  Service: Endoscopy;  Laterality: N/A;  12:00  . Esophagogastroduodenoscopy  04/28/2012    Procedure: ESOPHAGOGASTRODUODENOSCOPY (EGD);  Surgeon: [name], MD;  [facility];  Service: Endoscopy;  Laterality: N/A;  140-moved to 10:30 Ann notified pt    Current Outpatient Prescriptions  Medication Sig Dispense Refill  . diltiazem (CARDIZEM) 120 MG tablet Take 120 mg by mouth 2 (two) times daily.     Marland Kitchen estradiol (ESTRACE) 0.5 MG tablet Take 1 tablet (0.5 mg total) by mouth daily. 30 tablet 5  . gabapentin (NEURONTIN) 600 MG tablet Take 800 mg by mouth 2 (two) times daily.     . insulin detemir (LEVEMIR) 100 UNIT/ML injection Inject 15 Units into the skin at bedtime.     . isosorbide mononitrate (IMDUR) 30 MG 24 hr tablet Take 1 tablet (30 mg total) by mouth daily. 90 tablet 3  . levothyroxine (SYNTHROID, LEVOTHROID) 50 MCG tablet Take 50 mcg by mouth daily.      Marland Kitchen loperamide (IMODIUM) 2 MG capsule Take 2 mg by mouth as needed.    Marland Kitchen LORazepam (ATIVAN) 1 MG tablet Take 1 mg by mouth 2 (two) times daily. For sleep    . metoprolol (LOPRESSOR) 50 MG tablet Take 50 mg by  mouth daily.     Marland Kitchen NITROSTAT 0.4 MG SL tablet Place 0.4 mg under the tongue every 5 (five) minutes as needed for chest pain.     . pantoprazole (PROTONIX) 40 MG tablet Take 1 tablet (40 mg total) by mouth daily. 90 tablet 3  . pravastatin (PRAVACHOL) 20 MG tablet Take 20 mg by mouth daily.     Marland Kitchen Propylene Glycol (SYSTANE BALANCE OP) Apply 2 drops to eye daily as needed. FOR DRY EYES      . pseudoephedrine-guaifenesin (MUCINEX D) 60-600 MG per tablet Take 1 tablet by mouth as needed.    . temazepam (RESTORIL) 15 MG capsule Take 15 mg by mouth at bedtime as needed for sleep.    Marland Kitchen triamcinolone acetonide 40 MG/ML SUSP 40 mg,  mupirocin cream 2 % CREA 15 g Apply 1 application topically. Apply to affected area twice daily    . valsartan (DIOVAN) 160 MG tablet Take 160 mg by mouth daily.     . vitamin C (ASCORBIC ACID) 500 MG tablet Take 500 mg by mouth daily.     No current facility-administered medications for this visit.    Allergies:  Shellfish allergy; Tussigon; Hydrocodone; Glyburide; Levofloxacin; Lisinopril; Morphine; and Pantoprazole sodium   Social History: The patient  reports that she has never smoked. She has never used smokeless tobacco. She reports that she does not drink alcohol or use illicit drugs.    ROS:  Please see the history of present illness. Otherwise, complete review of systems is positive for arthritic pain in her right thumb and forearm..  All other systems are reviewed and negative.   Physical Exam: VS:  BP 134/66 mmHg  Pulse 77  Ht 5\' 4"  (1.626 m)  Wt 177 lb (80.287 kg)  BMI 30.37 kg/m2  SpO2 97%, BMI Body mass index is 30.37 kg/(m^2).  Wt Readings from Last 3 Encounters:  07/24/14 177 lb (80.287 kg)  03/17/14 176 lb 4 oz (79.946 kg)  02/09/14 179 lb (81.194 kg)     General: Patient appears comfortable at rest. HEENT: Conjunctiva and lids normal, oropharynx clear with moist mucosa. Neck: Supple, no elevated JVP or carotid bruits, no thyromegaly. Lungs: Clear to auscultation, nonlabored breathing at rest. Cardiac: Regular rate and rhythm, no S3 or significant systolic murmur, no pericardial rub. Abdomen: Soft, nontender, no hepatomegaly, bowel sounds present, no guarding or rebound. Extremities: No pitting edema, distal pulses 2+. Skin: Warm and dry. Musculoskeletal: No kyphosis. Neuropsychiatric: Alert and oriented x3, affect grossly appropriate.   ECG: ECG is not ordered today.  Recent Labwork: 10/19/2013: Hemoglobin 12.3    ASSESSMENT AND PLAN:  1. History of abnormal Cardiolite as outlined above suggesting ischemic heart disease, symptomatically quite stable on  medical therapy which includes Imdur. She has not wanted to pursue further invasive testing, and we will plan to continue medical therapy and observation.  2. History of recurrent GI bleeding and pyloroduodenal stenosis due to previous NSAID use, followed by Dr. Laural Golden. Reports not being able to take baby aspirin daily due to intermittent "dark" stools. I recommended that she keep follow-up with Dr. Laural Golden. This is another reason to hold off pursuing any potential coronary revascularization unless absolutely necessary, since she would require dual antiplatelet therapy, further increasing her risk of recurrent bleeding.  3. Type 2 diabetes mellitus, followed by Dr. Dorris Fetch.  4. Essential hypertension, no change in current regimen.  Current medicines were reviewed at length with the patient today.   Disposition: FU with me in  4 months.   Signed, Satira Sark, MD, [facility] 07/24/2014 1:20 PM    [facility] [address], [place], [place] [address] Phone: [phone]; Fax: [phone]

## 2014-07-27 ENCOUNTER — Encounter: Payer: Self-pay | Admitting: Nutrition

## 2014-07-27 ENCOUNTER — Encounter: Payer: Medicare Other | Attending: "Endocrinology | Admitting: Nutrition

## 2014-07-27 VITALS — Ht 64.0 in | Wt 179.0 lb

## 2014-07-27 DIAGNOSIS — IMO0002 Reserved for concepts with insufficient information to code with codable children: Secondary | ICD-10-CM

## 2014-07-27 DIAGNOSIS — Z713 Dietary counseling and surveillance: Secondary | ICD-10-CM | POA: Diagnosis not present

## 2014-07-27 DIAGNOSIS — E1165 Type 2 diabetes mellitus with hyperglycemia: Secondary | ICD-10-CM | POA: Insufficient documentation

## 2014-07-27 DIAGNOSIS — Z6830 Body mass index (BMI) 30.0-30.9, adult: Secondary | ICD-10-CM | POA: Diagnosis not present

## 2014-07-27 DIAGNOSIS — E663 Overweight: Secondary | ICD-10-CM | POA: Diagnosis not present

## 2014-07-27 DIAGNOSIS — Z794 Long term (current) use of insulin: Secondary | ICD-10-CM | POA: Diagnosis not present

## 2014-07-27 NOTE — Progress Notes (Signed)
  Medical Nutrition Therapy:  Appt start time: 1330 end time:  1430.  Assessment:  Primary concerns today: Diabetes. LIves with her husband. Does cook. Most recent A1c 6.9%. Her husband cooks or eats out quite a bit. Meds: Levemir 20 units and Novolog 5 with meals plus sliding scale. Physical activity: not much. FBS 91-145 mg.    Diet needs more fresh fruits and lower carb vegetables, increase water intake and more physical activity   Preferred Learning Style:  Auditory  Visual  Hands on  Learning Readiness:  Ready  Change in progress   MEDICATIONS: See list   DIETARY INTAKE:  24-hr recall:  B ( AM): I piece french toast, with butter, 8 oz milk Snk ( AM): lf gram cracker and pb, water L ( PM):  1/2 pb sandwich, dannon yogurt lf, water Snk ( PM): cereal bar ,water D ( PM): Toss salad, hamburger  and coffee Snk ( PM):frew berries. Beverages: water  Usual physical activity: ADL's  Estimated energy needs: 1500 calories 170 g carbohydrates 112 g protein 42 g fat  Progress Towards Goal(s):  In progress.   Nutritional Diagnosis:  NB-1.1 Food and nutrition-related knowledge deficit As related to Diabetes.  As evidenced by A1C 6.8%..    Intervention:  Nutrition counseling and diabetes education on diet, meal planning, target ranges for blood sugars, signs/symptoms of hyper/hypoglycemia and treatment of both, benefits of exercise with a low fat low sodium and high fiber diet..  Goal:  1. Follow My Plate Method 2. Eat three meals 3. Avoid snacks between. 4. Cut down on PB 5. Walk around the track once a day. 6. Get A1C to 6% in three months.  Teaching Method Utilized:   Visual Auditory Hands on  Handouts given during visit include:  The Plate Method  The Meal Plan Card  Barriers to learning/adherence to lifestyle change: none  Demonstrated degree of understanding via:  Teach Back   Monitoring/Evaluation:  Dietary intake, exercise, meal planning, and  body weight in 1 month(s).

## 2014-07-27 NOTE — Patient Instructions (Signed)
  Goal:  1. Follow My Plate Method 2. Eat three meals 3. Avoid snacks between. 4. Cut down on PB 5. Walk around the track once a day. 6. Get A1C to 6% in three months.

## 2014-08-03 DIAGNOSIS — L299 Pruritus, unspecified: Secondary | ICD-10-CM | POA: Diagnosis not present

## 2014-08-03 DIAGNOSIS — L309 Dermatitis, unspecified: Secondary | ICD-10-CM | POA: Diagnosis not present

## 2014-08-14 DIAGNOSIS — M791 Myalgia: Secondary | ICD-10-CM | POA: Diagnosis not present

## 2014-08-14 DIAGNOSIS — E1121 Type 2 diabetes mellitus with diabetic nephropathy: Secondary | ICD-10-CM | POA: Diagnosis not present

## 2014-08-14 DIAGNOSIS — M5413 Radiculopathy, cervicothoracic region: Secondary | ICD-10-CM | POA: Diagnosis not present

## 2014-08-14 DIAGNOSIS — R079 Chest pain, unspecified: Secondary | ICD-10-CM | POA: Diagnosis not present

## 2014-08-25 DIAGNOSIS — H5203 Hypermetropia, bilateral: Secondary | ICD-10-CM | POA: Diagnosis not present

## 2014-08-25 DIAGNOSIS — E109 Type 1 diabetes mellitus without complications: Secondary | ICD-10-CM | POA: Diagnosis not present

## 2014-08-25 DIAGNOSIS — H524 Presbyopia: Secondary | ICD-10-CM | POA: Diagnosis not present

## 2014-08-25 DIAGNOSIS — H52223 Regular astigmatism, bilateral: Secondary | ICD-10-CM | POA: Diagnosis not present

## 2014-08-31 ENCOUNTER — Telehealth: Payer: Self-pay | Admitting: Orthopedic Surgery

## 2014-08-31 NOTE — Telephone Encounter (Signed)
Patient called to request appointment for problem of left hip pain ("bursitis"); last seen here 03/05/09; therefore, she is a new patient again. Patient has had back surgery since then, and states has had no other  treatment or Xrays done on hips since then.  Due to back surgery - okay or schedule or are other medical records needed for review prior to scheduling?  Patient's ph# is 09/19/1945.

## 2014-08-31 NOTE — Telephone Encounter (Signed)
Hip bursitis  Schedule for hip injection   Not doing anything else   Can do wed

## 2014-09-04 NOTE — Telephone Encounter (Signed)
09/04/14 Reached patient; scheduled appointment accordingly.

## 2014-09-06 ENCOUNTER — Encounter: Payer: Self-pay | Admitting: Orthopedic Surgery

## 2014-09-06 ENCOUNTER — Ambulatory Visit (INDEPENDENT_AMBULATORY_CARE_PROVIDER_SITE_OTHER): Payer: Medicare Other | Admitting: Orthopedic Surgery

## 2014-09-06 ENCOUNTER — Ambulatory Visit: Payer: Medicare Other

## 2014-09-06 ENCOUNTER — Ambulatory Visit (INDEPENDENT_AMBULATORY_CARE_PROVIDER_SITE_OTHER): Payer: Medicare Other

## 2014-09-06 VITALS — BP 138/67 | Ht 64.0 in | Wt 177.0 lb

## 2014-09-06 DIAGNOSIS — M25551 Pain in right hip: Secondary | ICD-10-CM

## 2014-09-06 DIAGNOSIS — M25552 Pain in left hip: Secondary | ICD-10-CM

## 2014-09-06 DIAGNOSIS — M961 Postlaminectomy syndrome, not elsewhere classified: Secondary | ICD-10-CM

## 2014-09-06 NOTE — Progress Notes (Signed)
Patient ID: Rebecca Hartman, female   DOB: 1946/02/04, 69 y.o.   MRN: [id number] Patient ID: Rebecca Hartman, female   DOB: 1945-11-14, 69 y.o.   MRN: [id number]  Chief Complaint  Patient presents with  . Hip Pain    Left hip pain x 2 months, no known injury     Rebecca Hartman is a 69 y.o. female.   HPI The patient is coming in complaining of left hip pain. She is status post right total hip replacement for avascular necrosis in 2004 showed a right [place] arthroplasty in 2005 showed a left carpal tunnel thousand 6 she had L4-5 fusion in 2008 she has a history of fibromyalgia and hypertension as well as other maladies which are listed below. She presents for evaluation of pain in her left lumbar and buttock region without history of trauma. Her symptoms have been present for 2 months she reports a dull throbbing pain causes her leg to give way is worse in the morning and it 7 out of 10 and is worse with walking  The review of systems is notable for fatigue limb pain seasonal allergies weakness temperature intolerance to cold temperatures and irregular heartbeat  Review of Systems See hpi  Past Medical History  Diagnosis Date  . Essential hypertension   . Type 2 diabetes mellitus   . Hypothyroidism   . Fibromyalgia   . Chronic back pain   . Anemia   . Hiatal hernia   . Anxiety   . Hypercholesteremia   . Abnormal myocardial perfusion study     November 2015, mid to apical anteroseptal ischemia     Past Surgical History  Procedure Laterality Date  . Tonsillectomy    . Cholecystectomy    . Carpal tunnel release    . Back surgery    . Total hip revision    . Abdominal hysterectomy    . Esophagogastroduodenoscopy  10/31/2010    Procedure: ESOPHAGOGASTRODUODENOSCOPY (EGD);  Surgeon: [name], MD;  [facility]: [facility];  Service: Endoscopy;  Laterality: N/A;  . Colonoscopy  08/06/06 FIELDS  . Upper gastrointestinal endoscopy  10/03/2010    EGD DILATN PYLORIC CHANNEL  . Upper  gastrointestinal endoscopy  07/08/2010  . Upper gastrointestinal endoscopy  08/06/06    FIELDS  . Upper gastrointestinal endoscopy  06/29/2008    EGD  . Esophagogastroduodenoscopy  03/05/2011    Procedure: ESOPHAGOGASTRODUODENOSCOPY (EGD);  Surgeon: [name], MD;  Location: [facility];  Service: Endoscopy;  Laterality: N/A;  12:00  . Esophagogastroduodenoscopy  04/28/2012    Procedure: ESOPHAGOGASTRODUODENOSCOPY (EGD);  Surgeon: [name], MD;  Location: [facility];  Service: Endoscopy;  Laterality: N/A;  140-moved to 10:30 Ann notified pt    Family History  Problem Relation Age of Onset  . Diabetes Mother   . Stroke Mother   . Stroke Father   . Healthy Son     Social History History  Substance Use Topics  . Smoking status: Never Smoker   . Smokeless tobacco: Never Used  . Alcohol Use: No    Allergies  Allergen Reactions  . Shellfish Allergy Itching and Nausea And Vomiting  . Tussigon [Hydrocodone-Homatropine]     Per patient's husband , this medication caused confusion.  . Hydrocodone Itching  . Glyburide     Sudden Drop in Blood Sugars  . Levofloxacin Itching  . Lisinopril Other (See Comments)    SEVERE DROP IN PRESSURE  . Morphine Itching and Nausea Only  .  Pantoprazole Sodium [Pantoprazole Sodium] Nausea And Vomiting    PATIENT IS UNSURE OF REACTION    Current Outpatient Prescriptions  Medication Sig Dispense Refill  . diltiazem (CARDIZEM) 120 MG tablet Take 120 mg by mouth 2 (two) times daily.     Marland Kitchen estradiol (ESTRACE) 0.5 MG tablet Take 1 tablet (0.5 mg total) by mouth daily. 30 tablet 5  . gabapentin (NEURONTIN) 600 MG tablet Take 800 mg by mouth 2 (two) times daily.     . insulin detemir (LEVEMIR) 100 UNIT/ML injection Inject 15 Units into the skin at bedtime.     . isosorbide mononitrate (IMDUR) 30 MG 24 hr tablet Take 1 tablet (30 mg total) by mouth daily. 90 tablet 3  . levothyroxine (SYNTHROID, LEVOTHROID) 50 MCG tablet Take 50 mcg by  mouth daily.      Marland Kitchen loperamide (IMODIUM) 2 MG capsule Take 2 mg by mouth as needed.    Marland Kitchen LORazepam (ATIVAN) 1 MG tablet Take 1 mg by mouth 2 (two) times daily. For sleep    . metoprolol (LOPRESSOR) 50 MG tablet Take 50 mg by mouth daily.     Marland Kitchen NITROSTAT 0.4 MG SL tablet Place 0.4 mg under the tongue every 5 (five) minutes as needed for chest pain.     . pantoprazole (PROTONIX) 40 MG tablet Take 1 tablet (40 mg total) by mouth daily. 90 tablet 3  . pravastatin (PRAVACHOL) 20 MG tablet Take 20 mg by mouth daily.     Marland Kitchen Propylene Glycol (SYSTANE BALANCE OP) Apply 2 drops to eye daily as needed. FOR DRY EYES      . pseudoephedrine-guaifenesin (MUCINEX D) 60-600 MG per tablet Take 1 tablet by mouth as needed.    . temazepam (RESTORIL) 15 MG capsule Take 15 mg by mouth at bedtime as needed for sleep.    Marland Kitchen triamcinolone acetonide 40 MG/ML SUSP 40 mg, mupirocin cream 2 % CREA 15 g Apply 1 application topically. Apply to affected area twice daily    . valsartan (DIOVAN) 160 MG tablet Take 160 mg by mouth daily.     . vitamin C (ASCORBIC ACID) 500 MG tablet Take 500 mg by mouth daily.    . Vitamin D, Ergocalciferol, (DRISDOL) 50000 UNITS CAPS capsule Take 50,000 Units by mouth every 7 (seven) days.     No current facility-administered medications for this visit.       Physical Exam Blood pressure 138/67, height 5\' 4"  (1.626 m), weight 177 lb (80.287 kg). Physical Exam The patient is well developed well nourished and well groomed. Orientation to person place and time is normal  Mood is pleasant. Ambulatory status she does ambulate it doesn't appear to be related to her hip Both leg lengths are equal. She has flexion of 120 in both hips even with the right hip replacement she has no pain with internal rotation of the left hip both hips are stable motor exam normal. She has tenderness in the lumbar spine incision is well-healed no signs of erythema or infection. Muscles appear non-tense. Buttock is  tender as well. Distal pulses are intact mild peripheral edema  She has pain with left straight leg raise.  Data Reviewed Study review MRI from 2011 she had a posterior fusion noted at that time with hardware. She had mild lateral recess stenosis bilaterally at L2-3 and L3-4 shows subacute compression fracture T12. She had a broad base bulging degenerated annulus and severe facet disease at L5-S1 ligamentum flavum was buckled severe spinal stenosis was  noted at that level moderately severe in both right and left foramens. Her hip x-rays I ordered today show a stable hip and a non-arthritic left hip  Assessment Encounter Diagnoses  Name Primary?  . Left hip pain   . Hip pain, right Yes  . Failed back syndrome, lumbar     Plan Recommend MRI to image the lumbar spine for progressive disease. She will then be referred to neurosurgery Dr. Carloyn Manner is her choice.

## 2014-09-06 NOTE — Patient Instructions (Signed)
We will schedule MRI for you and call you with appt and results 

## 2014-09-07 ENCOUNTER — Ambulatory Visit: Payer: Medicare Other | Admitting: Nutrition

## 2014-09-13 DIAGNOSIS — Z713 Dietary counseling and surveillance: Secondary | ICD-10-CM | POA: Diagnosis not present

## 2014-09-13 DIAGNOSIS — E1165 Type 2 diabetes mellitus with hyperglycemia: Secondary | ICD-10-CM | POA: Diagnosis not present

## 2014-09-13 DIAGNOSIS — E559 Vitamin D deficiency, unspecified: Secondary | ICD-10-CM | POA: Diagnosis not present

## 2014-09-13 DIAGNOSIS — I10 Essential (primary) hypertension: Secondary | ICD-10-CM | POA: Diagnosis not present

## 2014-09-13 DIAGNOSIS — E782 Mixed hyperlipidemia: Secondary | ICD-10-CM | POA: Diagnosis not present

## 2014-09-13 DIAGNOSIS — Z794 Long term (current) use of insulin: Secondary | ICD-10-CM | POA: Diagnosis not present

## 2014-09-13 LAB — BASIC METABOLIC PANEL
BUN: 12 (ref 4–21)
Creatinine: 0.8 (ref 0.5–1.1)
Glucose: 135

## 2014-09-13 LAB — COMPREHENSIVE METABOLIC PANEL: Calcium: 9.3 (ref 8.7–10.7)

## 2014-09-13 LAB — HEPATIC FUNCTION PANEL
ALT: 34 (ref 7–35)
AST: 34 (ref 13–35)
Alkaline Phosphatase: 53 (ref 25–125)

## 2014-09-13 LAB — TSH: TSH: 2.6 (ref <=5.90)

## 2014-09-19 DIAGNOSIS — L281 Prurigo nodularis: Secondary | ICD-10-CM | POA: Diagnosis not present

## 2014-09-21 DIAGNOSIS — E039 Hypothyroidism, unspecified: Secondary | ICD-10-CM | POA: Diagnosis not present

## 2014-09-21 DIAGNOSIS — E6609 Other obesity due to excess calories: Secondary | ICD-10-CM | POA: Diagnosis not present

## 2014-09-21 DIAGNOSIS — I10 Essential (primary) hypertension: Secondary | ICD-10-CM | POA: Diagnosis not present

## 2014-09-21 DIAGNOSIS — E1165 Type 2 diabetes mellitus with hyperglycemia: Secondary | ICD-10-CM | POA: Diagnosis not present

## 2014-09-21 DIAGNOSIS — E785 Hyperlipidemia, unspecified: Secondary | ICD-10-CM | POA: Diagnosis not present

## 2014-09-21 DIAGNOSIS — E559 Vitamin D deficiency, unspecified: Secondary | ICD-10-CM | POA: Diagnosis not present

## 2014-09-25 ENCOUNTER — Ambulatory Visit (HOSPITAL_COMMUNITY)
Admission: RE | Admit: 2014-09-25 | Discharge: 2014-09-25 | Disposition: A | Discharge status: Home / Self Care | Payer: Medicare Other | Source: Ambulatory Visit | Attending: Orthopedic Surgery | Admitting: Orthopedic Surgery

## 2014-09-25 DIAGNOSIS — M25552 Pain in left hip: Secondary | ICD-10-CM | POA: Diagnosis not present

## 2014-09-25 DIAGNOSIS — Z9889 Other specified postprocedural states: Secondary | ICD-10-CM | POA: Diagnosis not present

## 2014-09-25 DIAGNOSIS — M961 Postlaminectomy syndrome, not elsewhere classified: Secondary | ICD-10-CM

## 2014-09-25 DIAGNOSIS — M5126 Other intervertebral disc displacement, lumbar region: Secondary | ICD-10-CM | POA: Diagnosis not present

## 2014-09-25 DIAGNOSIS — M4806 Spinal stenosis, lumbar region: Secondary | ICD-10-CM | POA: Insufficient documentation

## 2014-09-25 DIAGNOSIS — M47817 Spondylosis without myelopathy or radiculopathy, lumbosacral region: Secondary | ICD-10-CM | POA: Diagnosis not present

## 2014-09-25 DIAGNOSIS — M545 Low back pain: Secondary | ICD-10-CM | POA: Insufficient documentation

## 2014-09-28 ENCOUNTER — Telehealth: Payer: Self-pay | Admitting: Orthopedic Surgery

## 2014-09-28 NOTE — Telephone Encounter (Signed)
Patient called to request MRI results of 09/25/14 - her home ph# is [phone], and if during evening, alternate (cell phone) # is [phone].

## 2014-10-04 ENCOUNTER — Other Ambulatory Visit: Payer: Self-pay | Admitting: *Deleted

## 2014-10-04 ENCOUNTER — Telehealth: Payer: Self-pay | Admitting: *Deleted

## 2014-10-04 DIAGNOSIS — M48061 Spinal stenosis, lumbar region without neurogenic claudication: Secondary | ICD-10-CM

## 2014-10-04 NOTE — Telephone Encounter (Signed)
PATIENT AWARE OF MRI RESULTS: LUMBAR SPINAL STENOSIS + REFERRAL TO NEUROSURGERY  REFERRAL FAXED TO DR ROY PER PATIENT REQUEST

## 2014-10-09 DIAGNOSIS — M25531 Pain in right wrist: Secondary | ICD-10-CM | POA: Diagnosis not present

## 2014-10-17 DIAGNOSIS — M549 Dorsalgia, unspecified: Secondary | ICD-10-CM | POA: Diagnosis not present

## 2014-10-17 DIAGNOSIS — M791 Myalgia: Secondary | ICD-10-CM | POA: Diagnosis not present

## 2014-10-17 DIAGNOSIS — E1121 Type 2 diabetes mellitus with diabetic nephropathy: Secondary | ICD-10-CM | POA: Diagnosis not present

## 2014-10-17 DIAGNOSIS — I10 Essential (primary) hypertension: Secondary | ICD-10-CM | POA: Diagnosis not present

## 2014-10-18 ENCOUNTER — Other Ambulatory Visit: Payer: Self-pay | Admitting: Cardiology

## 2014-11-08 DIAGNOSIS — M25531 Pain in right wrist: Secondary | ICD-10-CM | POA: Diagnosis not present

## 2014-11-08 DIAGNOSIS — M19041 Primary osteoarthritis, right hand: Secondary | ICD-10-CM | POA: Diagnosis not present

## 2014-11-17 NOTE — Telephone Encounter (Signed)
appt 10/4 3:30p

## 2014-12-01 ENCOUNTER — Encounter: Payer: Self-pay | Admitting: Cardiology

## 2014-12-01 ENCOUNTER — Ambulatory Visit (INDEPENDENT_AMBULATORY_CARE_PROVIDER_SITE_OTHER): Payer: Medicare Other | Admitting: Cardiology

## 2014-12-01 VITALS — BP 130/72 | HR 75 | Ht 63.0 in | Wt 166.1 lb

## 2014-12-01 DIAGNOSIS — I10 Essential (primary) hypertension: Secondary | ICD-10-CM | POA: Diagnosis not present

## 2014-12-01 DIAGNOSIS — R9439 Abnormal result of other cardiovascular function study: Secondary | ICD-10-CM

## 2014-12-01 NOTE — Patient Instructions (Signed)
Your physician wants you to follow-up in: 6 months with Dr.McDowell You will receive a reminder letter in the mail two months in advance. If you don't receive a letter, please call our office to schedule the follow-up appointment.     Your physician recommends that you continue on your current medications as directed. Please refer to the Current Medication list given to you today.     Thank you for choosing [facility] !        

## 2014-12-01 NOTE — Progress Notes (Signed)
Cardiology Office Note  Date: 12/01/2014   ID: Rebecca Hartman, DOB 08-Dec-1945, MRN [id number]  PCP: Alonza Bogus, MD  Primary Cardiologist: Rozann Lesches, MD   Chief Complaint  Patient presents with  . Cardiac follow-up    History of Present Illness: Rebecca Hartman is a 69 y.o. female last seen in April. She presents for a routine follow-up visit. On medical therapy she has had no progressive chest pain symptoms. We continue to manage her medically with history of abnormal Cardiolite. She has preferred to hold off on invasive cardiac testing.  Main complaints are related to orthopedic issues including arthritic discomfort in her right wrist and also lower back. She has a history of hip replacement, also plans to see Dr. Carloyn Manner with lumbar disc disease.   Past Medical History  Diagnosis Date  . Essential hypertension   . Type 2 diabetes mellitus   . Hypothyroidism   . Fibromyalgia   . Chronic back pain   . Anemia   . Hiatal hernia   . Anxiety   . Hypercholesteremia   . Abnormal myocardial perfusion study     November 2015, mid to apical anteroseptal ischemia     Current Outpatient Prescriptions  Medication Sig Dispense Refill  . diltiazem (CARDIZEM) 120 MG tablet Take 120 mg by mouth 2 (two) times daily.     Marland Kitchen gabapentin (NEURONTIN) 600 MG tablet Take 800 mg by mouth 2 (two) times daily.     . insulin detemir (LEVEMIR) 100 UNIT/ML injection Inject 15 Units into the skin at bedtime.     . isosorbide mononitrate (IMDUR) 30 MG 24 hr tablet TAKE 1 TABLET BY MOUTH ONCE DAILY. 90 tablet 3  . levothyroxine (SYNTHROID, LEVOTHROID) 50 MCG tablet Take 50 mcg by mouth daily.      Marland Kitchen loperamide (IMODIUM) 2 MG capsule Take 2 mg by mouth as needed.    Marland Kitchen LORazepam (ATIVAN) 1 MG tablet Take 1 mg by mouth 2 (two) times daily. For sleep    . metoprolol (LOPRESSOR) 50 MG tablet Take 50 mg by mouth daily.     Marland Kitchen NITROSTAT 0.4 MG SL tablet Place 0.4 mg under the tongue every 5 (five)  minutes as needed for chest pain.     . pantoprazole (PROTONIX) 40 MG tablet Take 1 tablet (40 mg total) by mouth daily. 90 tablet 3  . pravastatin (PRAVACHOL) 20 MG tablet Take 20 mg by mouth daily.     Marland Kitchen Propylene Glycol (SYSTANE BALANCE OP) Apply 2 drops to eye daily as needed. FOR DRY EYES      . pseudoephedrine-guaifenesin (MUCINEX D) 60-600 MG per tablet Take 1 tablet by mouth as needed.    . temazepam (RESTORIL) 15 MG capsule Take 15 mg by mouth at bedtime as needed for sleep.    Marland Kitchen triamcinolone acetonide 40 MG/ML SUSP 40 mg, mupirocin cream 2 % CREA 15 g Apply 1 application topically. Apply to affected area twice daily    . valsartan (DIOVAN) 160 MG tablet Take 160 mg by mouth daily.     . vitamin C (ASCORBIC ACID) 500 MG tablet Take 500 mg by mouth daily.    . Vitamin D, Ergocalciferol, (DRISDOL) 50000 UNITS CAPS capsule Take 50,000 Units by mouth every 7 (seven) days.    Marland Kitchen estradiol (ESTRACE) 0.5 MG tablet Take 1 tablet (0.5 mg total) by mouth daily. 30 tablet 5   No current facility-administered medications for this visit.    Allergies:  Shellfish allergy;  Tussigon; Hydrocodone; Glyburide; Levofloxacin; Lisinopril; Morphine; and Pantoprazole sodium   Social History: The patient  reports that she has never smoked. She has never used smokeless tobacco. She reports that she does not drink alcohol or use illicit drugs.   ROS:  Please see the history of present illness. Otherwise, complete review of systems is positive for right wrist and lower back pain.  All other systems are reviewed and negative.   Physical Exam: VS:  BP 130/72 mmHg  Pulse 75  Ht 5\' 3"  (1.6 m)  Wt 166 lb 1.9 oz (75.352 kg)  BMI 29.43 kg/m2  SpO2 97%, BMI Body mass index is 29.43 kg/(m^2).  Wt Readings from Last 3 Encounters:  12/01/14 166 lb 1.9 oz (75.352 kg)  09/06/14 177 lb (80.287 kg)  07/27/14 179 lb (81.194 kg)     General: Patient appears comfortable at rest. HEENT: Conjunctiva and lids normal,  oropharynx clear with moist mucosa. Neck: Supple, no elevated JVP or carotid bruits, no thyromegaly. Lungs: Clear to auscultation, nonlabored breathing at rest. Cardiac: Regular rate and rhythm, no S3 or significant systolic murmur, no pericardial rub. Abdomen: Soft, nontender, no hepatomegaly, bowel sounds present, no guarding or rebound. Extremities: No pitting edema, distal pulses 2+.  ECG: ECG is not ordered today.  Assessment and Plan:  1. History of chest pain with abnormal Cardiolite demonstrating mid to apical anteroseptal ischemia. She has preferred medical therapy so far and has been stable. No changes were made today.  2. History of recurrent GI bleeding and pyloroduodenal stenosis due to previous NSAID use, followed by Dr. Laural Golden. Reports not being able to take baby aspirin daily due to intermittent "dark" stools. I recommended that she keep follow-up with Dr. Laural Golden. This is another reason to hold off pursuing any potential coronary revascularization unless absolutely necessary, since she would require dual antiplatelet therapy, further increasing her risk of recurrent bleeding.  3. Essential hypertension, blood pressure is recently well controlled today.  Current medicines were reviewed with the patient today.  Disposition: FU with men 6 months.   Signed, Satira Sark, MD, [facility] 12/01/2014 10:44 AM    [facility] [place], [place], [place] [address] Phone: [phone]; Fax: [phone]

## 2014-12-11 DIAGNOSIS — M25531 Pain in right wrist: Secondary | ICD-10-CM | POA: Diagnosis not present

## 2014-12-11 DIAGNOSIS — M19041 Primary osteoarthritis, right hand: Secondary | ICD-10-CM | POA: Diagnosis not present

## 2014-12-26 DIAGNOSIS — I10 Essential (primary) hypertension: Secondary | ICD-10-CM | POA: Diagnosis not present

## 2014-12-26 DIAGNOSIS — E1165 Type 2 diabetes mellitus with hyperglycemia: Secondary | ICD-10-CM | POA: Diagnosis not present

## 2014-12-26 DIAGNOSIS — M545 Low back pain: Secondary | ICD-10-CM | POA: Diagnosis not present

## 2014-12-26 DIAGNOSIS — J309 Allergic rhinitis, unspecified: Secondary | ICD-10-CM | POA: Diagnosis not present

## 2014-12-28 DIAGNOSIS — T814XXA Infection following a procedure, initial encounter: Secondary | ICD-10-CM | POA: Diagnosis not present

## 2014-12-28 DIAGNOSIS — L281 Prurigo nodularis: Secondary | ICD-10-CM | POA: Diagnosis not present

## 2015-01-03 DIAGNOSIS — E039 Hypothyroidism, unspecified: Secondary | ICD-10-CM | POA: Diagnosis not present

## 2015-01-03 DIAGNOSIS — E782 Mixed hyperlipidemia: Secondary | ICD-10-CM | POA: Diagnosis not present

## 2015-01-03 DIAGNOSIS — Z23 Encounter for immunization: Secondary | ICD-10-CM | POA: Diagnosis not present

## 2015-01-03 DIAGNOSIS — I10 Essential (primary) hypertension: Secondary | ICD-10-CM | POA: Diagnosis not present

## 2015-01-03 DIAGNOSIS — E1165 Type 2 diabetes mellitus with hyperglycemia: Secondary | ICD-10-CM | POA: Diagnosis not present

## 2015-01-03 LAB — HEMOGLOBIN A1C: Hgb A1c MFr Bld: 6.6 % — AB (ref 4.0–6.0)

## 2015-01-03 LAB — CBC AND DIFFERENTIAL
HCT: 40 (ref 36–46)
Hemoglobin: 13.7 (ref 12.0–16.0)
WBC: 8.5

## 2015-01-12 ENCOUNTER — Ambulatory Visit (INDEPENDENT_AMBULATORY_CARE_PROVIDER_SITE_OTHER): Payer: Medicare Other | Admitting: "Endocrinology

## 2015-01-12 ENCOUNTER — Encounter: Payer: Self-pay | Admitting: "Endocrinology

## 2015-01-12 VITALS — BP 131/69 | HR 67 | Ht 64.0 in | Wt 165.0 lb

## 2015-01-12 DIAGNOSIS — E1165 Type 2 diabetes mellitus with hyperglycemia: Secondary | ICD-10-CM

## 2015-01-12 DIAGNOSIS — IMO0002 Reserved for concepts with insufficient information to code with codable children: Secondary | ICD-10-CM

## 2015-01-12 DIAGNOSIS — I10 Essential (primary) hypertension: Secondary | ICD-10-CM

## 2015-01-12 DIAGNOSIS — E038 Other specified hypothyroidism: Secondary | ICD-10-CM | POA: Diagnosis not present

## 2015-01-12 DIAGNOSIS — E1159 Type 2 diabetes mellitus with other circulatory complications: Secondary | ICD-10-CM | POA: Diagnosis not present

## 2015-01-12 DIAGNOSIS — E785 Hyperlipidemia, unspecified: Secondary | ICD-10-CM | POA: Diagnosis not present

## 2015-01-12 MED ORDER — INSULIN DETEMIR 100 UNIT/ML ~~LOC~~ SOLN: 25.0000 [IU] | Freq: Every day | SUBCUTANEOUS | Status: DC

## 2015-01-12 MED ORDER — VITAMIN D (ERGOCALCIFEROL) 1.25 MG (50000 UNIT) PO CAPS: 50000.0000 [IU] | ORAL_CAPSULE | ORAL | Status: DC

## 2015-01-12 NOTE — Progress Notes (Signed)
Subjective:    Patient ID: Rebecca Hartman, female    DOB: [birth year],    Past Medical History  Diagnosis Date  . Essential hypertension   . Type 2 diabetes mellitus ([place])   . Hypothyroidism   . Fibromyalgia   . Chronic back pain   . Anemia   . Hiatal hernia   . Anxiety   . Hypercholesteremia   . Abnormal myocardial perfusion study     November 2015, mid to apical anteroseptal ischemia    Past Surgical History  Procedure Laterality Date  . Tonsillectomy    . Cholecystectomy    . Carpal tunnel release    . Back surgery    . Total hip revision    . Abdominal hysterectomy    . Esophagogastroduodenoscopy  10/31/2010    Procedure: ESOPHAGOGASTRODUODENOSCOPY (EGD);  Surgeon: [name], MD;  Location: [facility];  Service: Endoscopy;  Laterality: N/A;  . Colonoscopy  08/06/06 FIELDS  . Upper gastrointestinal endoscopy  10/03/2010    EGD DILATN PYLORIC CHANNEL  . Upper gastrointestinal endoscopy  07/08/2010  . Upper gastrointestinal endoscopy  08/06/06    FIELDS  . Upper gastrointestinal endoscopy  06/29/2008    EGD  . Esophagogastroduodenoscopy  03/05/2011    Procedure: ESOPHAGOGASTRODUODENOSCOPY (EGD);  Surgeon: [name], MD;  Location: [facility];  Service: Endoscopy;  Laterality: N/A;  12:00  . Esophagogastroduodenoscopy  04/28/2012    Procedure: ESOPHAGOGASTRODUODENOSCOPY (EGD);  Surgeon: [name], MD;  Location: [facility];  Service: Endoscopy;  Laterality: N/A;  140-moved to 10:30 Ann notified pt   Social History   Social History  . Marital Status: Married    Spouse Name: N/A  . Number of Children: N/A  . Years of Education: N/A   Social History Main Topics  . Smoking status: Never Smoker   . Smokeless tobacco: Never Used  . Alcohol Use: No  . Drug Use: No  . Sexual Activity: Yes    Birth Control/ Protection: Post-menopausal   Other Topics Concern  . None   Social History Narrative   Outpatient Encounter Prescriptions as of  01/12/2015  Medication Sig  . diltiazem (CARDIZEM) 120 MG tablet Take 120 mg by mouth 2 (two) times daily.   Marland Kitchen gabapentin (NEURONTIN) 600 MG tablet Take 800 mg by mouth 2 (two) times daily.   . insulin detemir (LEVEMIR) 100 UNIT/ML injection Inject 0.25 mLs (25 Units total) into the skin at bedtime.  . isosorbide mononitrate (IMDUR) 30 MG 24 hr tablet TAKE 1 TABLET BY MOUTH ONCE DAILY.  Marland Kitchen levothyroxine (SYNTHROID, LEVOTHROID) 50 MCG tablet Take 50 mcg by mouth daily.    Marland Kitchen loperamide (IMODIUM) 2 MG capsule Take 2 mg by mouth as needed.  Marland Kitchen LORazepam (ATIVAN) 1 MG tablet Take 1 mg by mouth 2 (two) times daily. For sleep  . metoprolol (LOPRESSOR) 50 MG tablet Take 50 mg by mouth daily.   Marland Kitchen NITROSTAT 0.4 MG SL tablet Place 0.4 mg under the tongue every 5 (five) minutes as needed for chest pain.   . pantoprazole (PROTONIX) 40 MG tablet Take 1 tablet (40 mg total) by mouth daily.  . pravastatin (PRAVACHOL) 20 MG tablet Take 20 mg by mouth daily.   Marland Kitchen Propylene Glycol (SYSTANE BALANCE OP) Apply 2 drops to eye daily as needed. FOR DRY EYES    . pseudoephedrine-guaifenesin (MUCINEX D) 60-600 MG per tablet Take 1 tablet by mouth as needed.  . temazepam (RESTORIL) 15 MG capsule  Take 15 mg by mouth at bedtime as needed for sleep.  Marland Kitchen triamcinolone acetonide 40 MG/ML SUSP 40 mg, mupirocin cream 2 % CREA 15 g Apply 1 application topically. Apply to affected area twice daily  . valsartan (DIOVAN) 160 MG tablet Take 160 mg by mouth daily.   . vitamin C (ASCORBIC ACID) 500 MG tablet Take 500 mg by mouth daily.  . Vitamin D, Ergocalciferol, (DRISDOL) 50000 UNITS CAPS capsule Take 1 capsule (50,000 Units total) by mouth every 7 (seven) days.  . [DISCONTINUED] insulin detemir (LEVEMIR) 100 UNIT/ML injection Inject 25 Units into the skin at bedtime.  . [DISCONTINUED] Vitamin D, Ergocalciferol, (DRISDOL) 50000 UNITS CAPS capsule Take 50,000 Units by mouth every 7 (seven) days.  Marland Kitchen estradiol (ESTRACE) 0.5 MG tablet Take  1 tablet (0.5 mg total) by mouth daily.   No facility-administered encounter medications on file as of 01/12/2015.   ALLERGIES: Allergies  Allergen Reactions  . Shellfish Allergy Itching and Nausea And Vomiting  . Tussigon [Hydrocodone-Homatropine]     Per patient's husband , this medication caused confusion.  . Hydrocodone Itching  . Glyburide     Sudden Drop in Blood Sugars  . Levofloxacin Itching  . Lisinopril Other (See Comments)    SEVERE DROP IN PRESSURE  . Morphine Itching and Nausea Only  . Pantoprazole Sodium [Pantoprazole Sodium] Nausea And Vomiting    PATIENT IS UNSURE OF REACTION   VACCINATION STATUS: Immunization History  Administered Date(s) Administered  . Pneumococcal Polysaccharide-23 12/29/2009    Diabetes She presents for her follow-up diabetic visit. She has type 2 diabetes mellitus. Onset time: She was diagnosed at approximate age of 74 years. Her disease course has been improving. Pertinent negatives for hypoglycemia include no confusion, headaches, pallor or seizures. Associated symptoms include fatigue. Pertinent negatives for diabetes include no chest pain, no polydipsia, no polyphagia and no polyuria. There are no hypoglycemic complications. Symptoms are stable. Diabetic complications include a CVA. Risk factors for coronary artery disease include diabetes mellitus, dyslipidemia, hypertension and sedentary lifestyle. She is compliant with treatment all of the time. Her weight is decreasing steadily (She lost 11 pounds overall.). She has had a previous visit with a dietitian. Her home blood glucose trend is decreasing steadily. An ACE inhibitor/angiotensin II receptor blocker is being taken. Eye exam is current.  Hypertension This is a chronic problem. The current episode started more than 1 year ago. The problem has been gradually improving since onset. Pertinent negatives include no chest pain, headaches, palpitations or shortness of breath. Past treatments  include angiotensin blockers. Hypertensive end-organ damage includes CVA and a thyroid problem.  Thyroid Problem Presents for follow-up visit. Symptoms include fatigue. Patient reports no cold intolerance, diarrhea, heat intolerance or palpitations. The symptoms have been stable. Her past medical history is significant for hyperlipidemia.  Hyperlipidemia This is a chronic problem. The current episode started more than 1 year ago. Pertinent negatives include no chest pain, myalgias or shortness of breath. Current antihyperlipidemic treatment includes statins. Risk factors for coronary artery disease include dyslipidemia, diabetes mellitus and hypertension.     Review of Systems  Constitutional: Positive for fatigue. Negative for unexpected weight change.  HENT: Negative for trouble swallowing and voice change.   Eyes: Negative for visual disturbance.  Respiratory: Negative for cough, shortness of breath and wheezing.   Cardiovascular: Negative for chest pain, palpitations and leg swelling.  Gastrointestinal: Negative for nausea, vomiting and diarrhea.  Endocrine: Negative for cold intolerance, heat intolerance, polydipsia, polyphagia and polyuria.  Musculoskeletal: Negative for myalgias and arthralgias.  Skin: Negative for color change, pallor, rash and wound.  Neurological: Negative for seizures and headaches.  Psychiatric/Behavioral: Negative for suicidal ideas and confusion.    Objective:    BP 131/69 mmHg  Pulse 67  Ht 5\' 4"  (1.626 m)  Wt 165 lb (74.844 kg)  BMI 28.31 kg/m2  SpO2 97%  Wt Readings from Last 3 Encounters:  01/12/15 165 lb (74.844 kg)  12/01/14 166 lb 1.9 oz (75.352 kg)  09/06/14 177 lb (80.287 kg)    Physical Exam  Constitutional: She is oriented to person, place, and time. She appears well-developed.  HENT:  Head: Normocephalic and atraumatic.  Eyes: EOM are normal.  Neck: Normal range of motion. Neck supple. No tracheal deviation present. No thyromegaly  present.  Cardiovascular: Normal rate and regular rhythm.   Pulmonary/Chest: Effort normal and breath sounds normal.  Abdominal: Soft. Bowel sounds are normal. There is no tenderness. There is no guarding.  Musculoskeletal: Normal range of motion. She exhibits no edema.  Neurological: She is alert and oriented to person, place, and time. She has normal reflexes. No cranial nerve deficit. Coordination normal.  Skin: Skin is warm and dry. No rash noted. No erythema. No pallor.  Psychiatric: She has a normal mood and affect. Judgment normal.    Results for orders placed or performed in visit on 01/12/15  Hemoglobin A1c  Result Value Ref Range   Hgb A1c MFr Bld 6.6 (A) 4.0 - 6.0 %   Complete Blood Count (Most recent): Lab Results  Component Value Date   WBC 9.2 08/31/2012   HGB 12.3 10/19/2013   HCT 36.9 10/19/2013   MCV 82.8 08/31/2012   PLT 307 08/31/2012   Chemistry (most recent): Lab Results  Component Value Date   NA 130* 11/27/2010   K 3.7 11/27/2010   CL 100 11/27/2010   CO2 18* 11/27/2010   BUN 7 11/27/2010   CREATININE 0.88 04/29/2012   Diabetic Labs (most recent): Lab Results  Component Value Date   HGBA1C 6.6* 01/03/2015   Lipid profile (most recent): No results found for: TRIG, CHOL       Assessment & Plan:   1. Uncontrolled type 2 diabetes mellitus with other circulatory complication (HCC) Her diabetes is  complicated by CVA. Patient came with stable glucose profile, and  recent A1c of 6.6 %.  Glucose logs and insulin administration records pertaining to this visit,  to be scanned into patient's records.  Recent labs reviewed. - Patient remains at a high risk for more acute and chronic complications of diabetes which include CAD, CVA, CKD, retinopathy, and neuropathy. These are all discussed in detail with the patient.  - I have re-counseled the patient on diet management and weight loss  by adopting a carbohydrate restricted / protein rich  Diet. -  Patient is advised to stick to a routine mealtimes to eat 3 meals  a day and avoid unnecessary snacks ( to snack only to correct hypoglycemia).  - Suggestion is made for patient to avoid simple carbohydrates   from their diet including Cakes , Desserts, Ice Cream,  Soda (  diet and regular) , Sweet Tea , Candies,  Chips, Cookies, Artificial Sweeteners,   and "Sugar-free" Products .  This will help patient to have stable blood glucose profile and potentially avoid unintended  Weight gain.  - The patient  has been  scheduled with Jearld Fenton, RDN, CDE for individualized DM education. - I have approached patient  with the following individualized plan to manage diabetes and patient agrees.  - Lower Levemir  To 25 units qhs, and  D/c humalog for now. Continue monitoring BG qam. -Patient is encouraged to call clinic for blood glucose levels less than 70 or above 300 mg /dl. -She is intolerant to MTF. -Continue Invokana 100mg  po qam, SE discussed with patient.   - Patient will be considered for incretin therapy as appropriate next visit. - Patient specific target  for A1c; LDL, HDL, Triglycerides, and  Waist Circumference were discussed in detail.  2) BP/HTN: Controlled. Continue current medications including ARB.  3) Lipids/HPL: continue statins-pravastatin 20 mg by mouth daily at bedtime.Marland Kitchen 4)  Weight/Diet: CDE consult in progress, exercise, and carbohydrates information provided.  5) Hypothyroidism: continue LT4 50 mcg po qam .  - We discussed about correct intake of levothyroxine, at fasting, with water, separated by at least 30 minutes from breakfast, and separated by more than 4 hours from calcium, iron, multivitamins, acid reflux medications (PPIs). -Patient is made aware of the fact that thyroid hormone replacement is needed for life, dose to be adjusted by periodic monitoring of thyroid function tests.  5) Chronic Care/Health Maintenance:  -Patient is on ACEI and Statin medications and  encouraged to continue to follow up with Ophthalmology, Podiatrist at least yearly or according to recommendations, and advised to  stay away from smoking. I have recommended yearly flu vaccine and pneumonia vaccination at least every 5 years; moderate intensity exercise for up to 150 minutes weekly; and  sleep for at least 7 hours a day.  I advised patient to maintain close follow up with their PCP for primary care needs.  Patient is asked to bring meter and  blood glucose logs during their next visit.   Follow up plan: Return in about 3 months (around 04/14/2015) for diabetes, high blood pressure, high cholesterol, underactive thyroid, follow up with pre-visit labs, meter, and logs.  Glade Lloyd, MD Phone: [phone]  Fax: [phone]   01/12/2015, 11:49 AM

## 2015-01-12 NOTE — Patient Instructions (Signed)

## 2015-01-23 ENCOUNTER — Encounter (INDEPENDENT_AMBULATORY_CARE_PROVIDER_SITE_OTHER): Payer: Self-pay | Admitting: Internal Medicine

## 2015-01-23 ENCOUNTER — Ambulatory Visit (INDEPENDENT_AMBULATORY_CARE_PROVIDER_SITE_OTHER): Payer: Medicare Other | Admitting: Internal Medicine

## 2015-01-23 VITALS — BP 118/74 | HR 68 | Temp 97.1°F | Resp 18 | Ht 64.0 in | Wt 163.2 lb

## 2015-01-23 DIAGNOSIS — E164 Increased secretion of gastrin: Secondary | ICD-10-CM | POA: Diagnosis not present

## 2015-01-23 DIAGNOSIS — K2100 Gastro-esophageal reflux disease with esophagitis, without bleeding: Secondary | ICD-10-CM

## 2015-01-23 DIAGNOSIS — K589 Irritable bowel syndrome without diarrhea: Secondary | ICD-10-CM | POA: Diagnosis not present

## 2015-01-23 NOTE — Patient Instructions (Signed)
Will call when blood test result received

## 2015-01-23 NOTE — Progress Notes (Signed)
Presenting complaint;  Follow-up for GERD, history of peptic ulcer disease iron deficiency anemia and elevated gastrin level.  Subjective:  Patient is 69 year old Caucasian female who is here for yearly visit. She says she has had a good year. She has been under care of Dr. Dorris Fetch regarding diabetic management and she feels her control is improved. She rarely has heartburn and/or diarrhea. She may have taken one or 2 pills of Imodium in the last 3 months. She denies dysphagia nausea vomiting abdominal pain. She's been having problems with her right wrist and had steroid injection by Dr. Lisette Grinder. She states it helped her pain but her blood glucose level ran high for several days. She had blood work by Dr.Nida earlier this month which is reviewed under lab data.   Current Medications: Outpatient Encounter Prescriptions as of 01/23/2015  Medication Sig  . desoximetasone (TOPICORT) 0.25 % cream Apply 1 application topically 2 (two) times daily.   Marland Kitchen diltiazem (CARDIZEM) 120 MG tablet Take 120 mg by mouth 2 (two) times daily.   Marland Kitchen gabapentin (NEURONTIN) 600 MG tablet Take 800 mg by mouth 2 (two) times daily.   . insulin detemir (LEVEMIR) 100 UNIT/ML injection Inject 0.25 mLs (25 Units total) into the skin at bedtime.  . isosorbide mononitrate (IMDUR) 30 MG 24 hr tablet TAKE 1 TABLET BY MOUTH ONCE DAILY.  Marland Kitchen levothyroxine (SYNTHROID, LEVOTHROID) 50 MCG tablet Take 50 mcg by mouth daily.    Marland Kitchen loperamide (IMODIUM) 2 MG capsule Take 2 mg by mouth as needed.  Marland Kitchen LORazepam (ATIVAN) 1 MG tablet Take 1 mg by mouth at bedtime. For sleep  . metoprolol (LOPRESSOR) 50 MG tablet Take 50 mg by mouth daily.   Marland Kitchen NITROSTAT 0.4 MG SL tablet Place 0.4 mg under the tongue every 5 (five) minutes as needed for chest pain.   . pantoprazole (PROTONIX) 40 MG tablet Take 1 tablet (40 mg total) by mouth daily.  . pravastatin (PRAVACHOL) 20 MG tablet Take 20 mg by mouth daily.   Marland Kitchen Propylene Glycol (SYSTANE BALANCE OP) Apply 2  drops to eye daily as needed. FOR DRY EYES    . pseudoephedrine-guaifenesin (MUCINEX D) 60-600 MG per tablet Take 1 tablet by mouth as needed.  . temazepam (RESTORIL) 15 MG capsule Take 15 mg by mouth at bedtime as needed for sleep.  . valsartan (DIOVAN) 160 MG tablet Take 160 mg by mouth daily.   . vitamin C (ASCORBIC ACID) 500 MG tablet Take 500 mg by mouth daily.  . Vitamin D, Ergocalciferol, (DRISDOL) 50000 UNITS CAPS capsule Take 1 capsule (50,000 Units total) by mouth every 7 (seven) days.  Marland Kitchen estradiol (ESTRACE) 0.5 MG tablet Take 1 tablet (0.5 mg total) by mouth daily.  . [DISCONTINUED] triamcinolone acetonide 40 MG/ML SUSP 40 mg, mupirocin cream 2 % CREA 15 g Apply 1 application topically. Apply to affected area twice daily   No facility-administered encounter medications on file as of 01/23/2015.     Objective: Blood pressure 118/74, pulse 68, temperature 97.1 F (36.2 C), temperature source Oral, resp. rate 18, height 5\' 4"  (1.626 m), weight 163 lb 3.2 oz (74.027 kg). Patient is alert and in no acute distress. Conjunctiva is pink. Sclera is nonicteric Oropharyngeal mucosa is normal. No neck masses or thyromegaly noted. Cardiac exam with regular rhythm normal S1 and S2. She has faint systolic ejection murmur at left sternal border. Lungs are clear to auscultation. Abdomen is symmetrical soft and nontender without organomegaly or masses. No LE edema or clubbing  noted.  Labs/studies Results: Lab data from 01/03/2015 WBC 8.5, H&H 13.7 and 39.6 and platelet count 287K. TSH 1.86 BUN 13 and creatinine 0.88 Bilirubin 0.3, AP 67, AST 19, ALT 17 total protein 6.5 and albumin 4.1. Serum calcium 9.0.  Gastrin level on 08/31/2012 was 48 Gastrin level on 05/27/2012 was 429 Gastrin level was 580 on 04/06/2012 Gastrin level was 157 on 06/16/2011.  Assessment:  #1. Gastroesophageal reflux disease. She is doing well with therapy. She has history of erosive reflux esophagitis as well  as history of peptic ulcer disease and needs to continue PPI indefinitely. #2. History of iron deficiency anemia. H&H remains normal. #3. History of elevated serum gastrin level. Last level was normal. Follow-up is needed to document that it stable. Elevated gastrin level most likely secondary to complicated peptic ulcer disease for which she is fully recovered since she is not using NSAIDs anymore. #4. Irritable bowel syndrome. She is not having much diarrhea or urgency and using Imodium once in a while.   Plan:  Continue pantoprazole at current dose. Fasting gastrin level with next blood draw. Office visit in one year.

## 2015-01-24 ENCOUNTER — Encounter (INDEPENDENT_AMBULATORY_CARE_PROVIDER_SITE_OTHER): Payer: Self-pay | Admitting: Internal Medicine

## 2015-01-25 ENCOUNTER — Encounter (INDEPENDENT_AMBULATORY_CARE_PROVIDER_SITE_OTHER): Payer: Self-pay | Admitting: *Deleted

## 2015-01-25 ENCOUNTER — Other Ambulatory Visit (INDEPENDENT_AMBULATORY_CARE_PROVIDER_SITE_OTHER): Payer: Self-pay | Admitting: *Deleted

## 2015-01-25 ENCOUNTER — Telehealth (INDEPENDENT_AMBULATORY_CARE_PROVIDER_SITE_OTHER): Payer: Self-pay | Admitting: *Deleted

## 2015-01-25 DIAGNOSIS — E164 Increased secretion of gastrin: Secondary | ICD-10-CM

## 2015-01-25 DIAGNOSIS — D509 Iron deficiency anemia, unspecified: Secondary | ICD-10-CM

## 2015-01-25 DIAGNOSIS — K219 Gastro-esophageal reflux disease without esophagitis: Secondary | ICD-10-CM

## 2015-01-25 NOTE — Telephone Encounter (Signed)
Per Dr.Rehman the patient will need to have labs drawn. 

## 2015-02-01 DIAGNOSIS — Z01419 Encounter for gynecological examination (general) (routine) without abnormal findings: Secondary | ICD-10-CM | POA: Diagnosis not present

## 2015-02-01 DIAGNOSIS — M816 Localized osteoporosis [Lequesne]: Secondary | ICD-10-CM | POA: Diagnosis not present

## 2015-02-01 DIAGNOSIS — Z124 Encounter for screening for malignant neoplasm of cervix: Secondary | ICD-10-CM | POA: Diagnosis not present

## 2015-02-01 DIAGNOSIS — Z1231 Encounter for screening mammogram for malignant neoplasm of breast: Secondary | ICD-10-CM | POA: Diagnosis not present

## 2015-02-01 DIAGNOSIS — N958 Other specified menopausal and perimenopausal disorders: Secondary | ICD-10-CM | POA: Diagnosis not present

## 2015-02-01 DIAGNOSIS — Z6829 Body mass index (BMI) 29.0-29.9, adult: Secondary | ICD-10-CM | POA: Diagnosis not present

## 2015-02-02 ENCOUNTER — Encounter (INDEPENDENT_AMBULATORY_CARE_PROVIDER_SITE_OTHER): Payer: Self-pay

## 2015-02-06 ENCOUNTER — Other Ambulatory Visit: Payer: Self-pay | Admitting: Obstetrics and Gynecology

## 2015-02-06 DIAGNOSIS — R928 Other abnormal and inconclusive findings on diagnostic imaging of breast: Secondary | ICD-10-CM

## 2015-02-13 ENCOUNTER — Ambulatory Visit
Admission: RE | Admit: 2015-02-13 | Discharge: 2015-02-13 | Disposition: A | Discharge status: Home / Self Care | Payer: Medicare Other | Source: Ambulatory Visit | Attending: Obstetrics and Gynecology | Admitting: Obstetrics and Gynecology

## 2015-02-13 DIAGNOSIS — R928 Other abnormal and inconclusive findings on diagnostic imaging of breast: Secondary | ICD-10-CM | POA: Diagnosis not present

## 2015-02-13 DIAGNOSIS — N6489 Other specified disorders of breast: Secondary | ICD-10-CM | POA: Diagnosis not present

## 2015-02-15 ENCOUNTER — Other Ambulatory Visit: Payer: Self-pay | Admitting: "Endocrinology

## 2015-02-20 DIAGNOSIS — E119 Type 2 diabetes mellitus without complications: Secondary | ICD-10-CM | POA: Diagnosis not present

## 2015-02-20 DIAGNOSIS — I10 Essential (primary) hypertension: Secondary | ICD-10-CM | POA: Diagnosis not present

## 2015-02-20 DIAGNOSIS — B029 Zoster without complications: Secondary | ICD-10-CM | POA: Diagnosis not present

## 2015-03-15 DIAGNOSIS — M791 Myalgia: Secondary | ICD-10-CM | POA: Diagnosis not present

## 2015-03-15 DIAGNOSIS — E1121 Type 2 diabetes mellitus with diabetic nephropathy: Secondary | ICD-10-CM | POA: Diagnosis not present

## 2015-03-15 DIAGNOSIS — E1165 Type 2 diabetes mellitus with hyperglycemia: Secondary | ICD-10-CM | POA: Diagnosis not present

## 2015-03-15 DIAGNOSIS — I10 Essential (primary) hypertension: Secondary | ICD-10-CM | POA: Diagnosis not present

## 2015-04-01 HISTORY — PX: DENTAL SURGERY: SHX609

## 2015-05-28 ENCOUNTER — Ambulatory Visit: Payer: Medicare Other | Admitting: "Endocrinology

## 2015-06-13 DIAGNOSIS — M545 Low back pain: Secondary | ICD-10-CM | POA: Diagnosis not present

## 2015-06-13 DIAGNOSIS — K279 Peptic ulcer, site unspecified, unspecified as acute or chronic, without hemorrhage or perforation: Secondary | ICD-10-CM | POA: Diagnosis not present

## 2015-06-13 DIAGNOSIS — E1121 Type 2 diabetes mellitus with diabetic nephropathy: Secondary | ICD-10-CM | POA: Diagnosis not present

## 2015-06-13 DIAGNOSIS — I10 Essential (primary) hypertension: Secondary | ICD-10-CM | POA: Diagnosis not present

## 2015-06-27 ENCOUNTER — Ambulatory Visit (INDEPENDENT_AMBULATORY_CARE_PROVIDER_SITE_OTHER): Payer: PPO | Admitting: Cardiology

## 2015-06-27 ENCOUNTER — Encounter: Payer: Self-pay | Admitting: Cardiology

## 2015-06-27 VITALS — BP 126/62 | HR 75 | Ht 63.0 in | Wt 161.0 lb

## 2015-06-27 DIAGNOSIS — I10 Essential (primary) hypertension: Secondary | ICD-10-CM

## 2015-06-27 DIAGNOSIS — K279 Peptic ulcer, site unspecified, unspecified as acute or chronic, without hemorrhage or perforation: Secondary | ICD-10-CM | POA: Diagnosis not present

## 2015-06-27 DIAGNOSIS — R9439 Abnormal result of other cardiovascular function study: Secondary | ICD-10-CM

## 2015-06-27 DIAGNOSIS — M545 Low back pain: Secondary | ICD-10-CM | POA: Diagnosis not present

## 2015-06-27 DIAGNOSIS — E1121 Type 2 diabetes mellitus with diabetic nephropathy: Secondary | ICD-10-CM | POA: Diagnosis not present

## 2015-06-27 LAB — MICROALBUMIN, URINE: Microalb, Ur: 4.6

## 2015-06-27 LAB — CBC AND DIFFERENTIAL
HCT: 38 (ref 36–46)
Hemoglobin: 12.8 (ref 12.0–16.0)
Platelets: 250 (ref 150–399)
WBC: 5.8

## 2015-06-27 LAB — HEMOGLOBIN A1C: Hemoglobin A1C: 6.1

## 2015-06-27 NOTE — Patient Instructions (Signed)

## 2015-06-27 NOTE — Progress Notes (Signed)
Cardiology Office Note  Date: 06/27/2015   ID: Rebecca Hartman, DOB 1945/12/25, MRN [id number]  PCP: Alonza Bogus, MD  Primary Cardiologist: Rozann Lesches, MD   Chief Complaint  Patient presents with  . Cardiac follow-up    History of Present Illness: Rebecca Hartman is a 70 y.o. female last seen in September 2016. She presents for a routine follow-up visit. Since last encounter she has not reported any recurring chest pain symptoms or progressive shortness of breath. She follows with Dr. Luan Pulling on a regular basis. I reviewed her most recent lab work is outlined below.  ECG today shows normal sinus rhythm with nonspecific T-wave changes. We have continued observation with history of previous abnormal Cardiolite. She has wanted to hold off on invasive testing. Current regimen includes Cardizem CD, Imdur, Lopressor, Pravachol, and as needed nitroglycerin. She has a previous history of peptic ulcer disease due to NSAIDs. She is not on aspirin at this time.  Past Medical History  Diagnosis Date  . Essential hypertension   . Type 2 diabetes mellitus ([place])   . Hypothyroidism   . Fibromyalgia   . Chronic back pain   . Anemia   . Hiatal hernia   . Anxiety   . Hypercholesteremia   . Abnormal myocardial perfusion study     November 2015, mid to apical anteroseptal ischemia     Current Outpatient Prescriptions  Medication Sig Dispense Refill  . desoximetasone (TOPICORT) 0.25 % cream Apply 1 application topically 2 (two) times daily.     Marland Kitchen diltiazem (CARDIZEM) 120 MG tablet Take 120 mg by mouth 2 (two) times daily.     Marland Kitchen gabapentin (NEURONTIN) 600 MG tablet Take 800 mg by mouth 2 (two) times daily.     . insulin detemir (LEVEMIR) 100 UNIT/ML injection Inject 0.25 mLs (25 Units total) into the skin at bedtime. 15 mL 2  . INVOKANA 100 MG TABS tablet TAKE ONE TABLET BY MOUTH ONCE DAILY. 30 tablet 0  . isosorbide mononitrate (IMDUR) 30 MG 24 hr tablet TAKE 1 TABLET BY MOUTH ONCE  DAILY. 90 tablet 3  . levothyroxine (SYNTHROID, LEVOTHROID) 50 MCG tablet Take 50 mcg by mouth daily.      Marland Kitchen loperamide (IMODIUM) 2 MG capsule Take 2 mg by mouth as needed.    Marland Kitchen LORazepam (ATIVAN) 1 MG tablet Take 1 mg by mouth at bedtime. For sleep    . metoprolol (LOPRESSOR) 50 MG tablet Take 50 mg by mouth daily.     Marland Kitchen NITROSTAT 0.4 MG SL tablet Place 0.4 mg under the tongue every 5 (five) minutes as needed for chest pain.     . pantoprazole (PROTONIX) 40 MG tablet Take 1 tablet (40 mg total) by mouth daily. 90 tablet 3  . pravastatin (PRAVACHOL) 20 MG tablet Take 20 mg by mouth daily.     Marland Kitchen Propylene Glycol (SYSTANE BALANCE OP) Apply 2 drops to eye daily as needed. FOR DRY EYES      . pseudoephedrine-guaifenesin (MUCINEX D) 60-600 MG per tablet Take 1 tablet by mouth as needed.    . temazepam (RESTORIL) 15 MG capsule Take 15 mg by mouth at bedtime as needed for sleep.    . valsartan (DIOVAN) 160 MG tablet Take 160 mg by mouth daily.     . vitamin C (ASCORBIC ACID) 500 MG tablet Take 500 mg by mouth daily.    . Vitamin D, Ergocalciferol, (DRISDOL) 50000 UNITS CAPS capsule Take 1 capsule (50,000 Units total) by  mouth every 7 (seven) days. 12 capsule 0  . estradiol (ESTRACE) 0.5 MG tablet Take 1 tablet (0.5 mg total) by mouth daily. 30 tablet 5   No current facility-administered medications for this visit.   Allergies:  Shellfish allergy; Tussigon; Hydrocodone; Glyburide; Levofloxacin; Lisinopril; Morphine; and Pantoprazole sodium   Social History: The patient  reports that she has never smoked. She has never used smokeless tobacco. She reports that she does not drink alcohol or use illicit drugs.   ROS:  Please see the history of present illness. Otherwise, complete review of systems is positive for arthritic symptoms, recent "cold."  All other systems are reviewed and negative.   Physical Exam: VS:  BP 126/62 mmHg  Pulse 75  Ht 5\' 3"  (1.6 m)  Wt 161 lb (73.029 kg)  BMI 28.53 kg/m2   SpO2 99%, BMI Body mass index is 28.53 kg/(m^2).  Wt Readings from Last 3 Encounters:  06/27/15 161 lb (73.029 kg)  01/23/15 163 lb 3.2 oz (74.027 kg)  01/12/15 165 lb (74.844 kg)    General: Patient appears comfortable at rest. HEENT: Conjunctiva and lids normal, oropharynx clear with moist mucosa. Neck: Supple, no elevated JVP or carotid bruits, no thyromegaly. Lungs: Clear to auscultation, nonlabored breathing at rest. Cardiac: Regular rate and rhythm, no S3 or significant systolic murmur, no pericardial rub. Abdomen: Soft, nontender, no hepatomegaly, bowel sounds present, no guarding or rebound. Extremities: No pitting edema, distal pulses 2+.  ECG: I personally reviewed the prior tracing from 01/27/2015 which showed normal sinus rhythm.  Recent Labwork: October 2016: BUN 13, creatinine 0.9, potassium 4.8, AST 19, ALT 17, hemoglobin A1c 6.6, TSH 1.8, hemoglobin 13.7, platelets 287  Other Studies Reviewed Today:  Lexiscan Cardiolite 02/06/2014: FINDINGS: Baseline tracing shows sinus rhythm at 66 beats per min with nonspecific ST changes. Lexiscan bolus was given in standard fashion. Heart rate increased from 67 beats per min up to 79 beats per min, and blood pressure increased from 113/59 up to 121/71. No chest pain was reported. There were no diagnostic ST segment changes, and no arrhythmias were noted.  Analysis of the overall perfusion data finds adequate radiotracer uptake with some breast attenuation.  Perfusion: There is a moderate-sized, moderate intensity, mid to apical anteroseptal defect that is reversible and suggestive of ischemia.  Wall Motion: Normal left ventricular wall motion. No left ventricular dilation.  Left Ventricular Ejection Fraction: 72 %  End diastolic volume 61 ml  End systolic volume 17 ml  IMPRESSION: 1. Study consistent with mid to apical anteroseptal ischemia as detailed above.  2. Normal left ventricular wall motion.  3.  Left ventricular ejection fraction 72%  4. Intermediate-risk stress test findings*.  Assessment and Plan:  1. Symptomatically stable without active angina symptoms, history of abnormal Cardiolite suggesting mid to apical anteroseptal ischemia in 2015. She has preferred to hold off on invasive testing, and for now we will continue medical therapy and observation unless symptoms escalate. ECG today reviewed and stable.  2. Essential hypertension, blood pressure control is good today. No changes were made.  Current medicines were reviewed with the patient today.   Orders Placed This Encounter  Procedures  . EKG 12-Lead    Disposition: FU with me in 6 months.   Signed, Satira Sark, MD, [facility] 06/27/2015 11:32 AM    [place] [facility] [place]. [address], [place], [place] [address] Phone: [phone]; Fax: [phone]

## 2015-07-17 ENCOUNTER — Other Ambulatory Visit: Payer: Self-pay | Admitting: Obstetrics and Gynecology

## 2015-07-17 DIAGNOSIS — N63 Unspecified lump in unspecified breast: Secondary | ICD-10-CM

## 2015-08-02 DIAGNOSIS — L281 Prurigo nodularis: Secondary | ICD-10-CM | POA: Diagnosis not present

## 2015-08-09 DIAGNOSIS — N390 Urinary tract infection, site not specified: Secondary | ICD-10-CM | POA: Diagnosis not present

## 2015-08-16 ENCOUNTER — Ambulatory Visit
Admission: RE | Admit: 2015-08-16 | Discharge: 2015-08-16 | Disposition: A | Discharge status: Home / Self Care | Payer: PPO | Source: Ambulatory Visit | Attending: Obstetrics and Gynecology | Admitting: Obstetrics and Gynecology

## 2015-08-16 DIAGNOSIS — N63 Unspecified lump in unspecified breast: Secondary | ICD-10-CM

## 2015-08-16 DIAGNOSIS — R928 Other abnormal and inconclusive findings on diagnostic imaging of breast: Secondary | ICD-10-CM | POA: Diagnosis not present

## 2015-08-16 DIAGNOSIS — N6489 Other specified disorders of breast: Secondary | ICD-10-CM | POA: Diagnosis not present

## 2015-08-24 DIAGNOSIS — M25531 Pain in right wrist: Secondary | ICD-10-CM | POA: Diagnosis not present

## 2015-09-20 DIAGNOSIS — L989 Disorder of the skin and subcutaneous tissue, unspecified: Secondary | ICD-10-CM | POA: Diagnosis not present

## 2015-09-20 DIAGNOSIS — R079 Chest pain, unspecified: Secondary | ICD-10-CM | POA: Diagnosis not present

## 2015-09-20 DIAGNOSIS — I10 Essential (primary) hypertension: Secondary | ICD-10-CM | POA: Diagnosis not present

## 2015-09-20 DIAGNOSIS — M791 Myalgia: Secondary | ICD-10-CM | POA: Diagnosis not present

## 2015-10-25 DIAGNOSIS — L84 Corns and callosities: Secondary | ICD-10-CM | POA: Diagnosis not present

## 2015-10-25 DIAGNOSIS — L281 Prurigo nodularis: Secondary | ICD-10-CM | POA: Diagnosis not present

## 2015-10-31 ENCOUNTER — Encounter (INDEPENDENT_AMBULATORY_CARE_PROVIDER_SITE_OTHER): Payer: Self-pay | Admitting: Internal Medicine

## 2015-11-07 DIAGNOSIS — R32 Unspecified urinary incontinence: Secondary | ICD-10-CM | POA: Diagnosis not present

## 2015-12-17 ENCOUNTER — Other Ambulatory Visit: Payer: Self-pay | Admitting: Obstetrics and Gynecology

## 2015-12-17 DIAGNOSIS — Z1231 Encounter for screening mammogram for malignant neoplasm of breast: Secondary | ICD-10-CM

## 2015-12-26 ENCOUNTER — Ambulatory Visit (INDEPENDENT_AMBULATORY_CARE_PROVIDER_SITE_OTHER): Payer: PPO | Admitting: Cardiology

## 2015-12-26 ENCOUNTER — Encounter: Payer: Self-pay | Admitting: Cardiology

## 2015-12-26 VITALS — BP 118/64 | HR 75 | Ht 63.0 in | Wt 158.0 lb

## 2015-12-26 DIAGNOSIS — R9439 Abnormal result of other cardiovascular function study: Secondary | ICD-10-CM | POA: Diagnosis not present

## 2015-12-26 DIAGNOSIS — R011 Cardiac murmur, unspecified: Secondary | ICD-10-CM

## 2015-12-26 DIAGNOSIS — I10 Essential (primary) hypertension: Secondary | ICD-10-CM

## 2015-12-26 NOTE — Patient Instructions (Signed)

## 2015-12-26 NOTE — Progress Notes (Signed)
Cardiology Office Note  Date: 12/26/2015   ID: AZKA WICKENHAUSER, DOB Oct 12, 1945, MRN [id number]  PCP: Alonza Bogus, MD  Primary Cardiologist: Rozann Lesches, MD   Chief Complaint  Patient presents with  . Abnormal myocardial perfusion study    History of Present Illness: Rebecca Hartman is a 70 y.o. female last seen in March. She presents for routine follow-up with prior history of mid to apical anteroseptal ischemia by Cardiolite study in 2015. This has been managed conservatively on medical therapy at her request. She is not reporting any angina symptoms or increasing dyspnea exertion beyond NYHA class II.  She continues to follow with Dr. Luan Pulling, also states that she sees Dr. Edrick Oh periodically.  I reviewed her current cardiac medications which include Diovan, Pravachol, Lopressor, Cardizem CD, Imdur, and as needed nitroglycerin.  Past Medical History:  Diagnosis Date  . Abnormal myocardial perfusion study    November 2015, mid to apical anteroseptal ischemia   . Anemia   . Anxiety   . Chronic back pain   . Essential hypertension   . Fibromyalgia   . Hiatal hernia   . Hypercholesteremia   . Hypothyroidism   . Type 2 diabetes mellitus ([place])     Current Outpatient Prescriptions  Medication Sig Dispense Refill  . desoximetasone (TOPICORT) 0.25 % cream Apply 1 application topically 2 (two) times daily.     Marland Kitchen diltiazem (CARDIZEM) 120 MG tablet Take 120 mg by mouth 2 (two) times daily.     Marland Kitchen gabapentin (NEURONTIN) 600 MG tablet Take 800 mg by mouth 2 (two) times daily.     . insulin detemir (LEVEMIR) 100 UNIT/ML injection Inject 0.25 mLs (25 Units total) into the skin at bedtime. 15 mL 2  . isosorbide mononitrate (IMDUR) 30 MG 24 hr tablet TAKE 1 TABLET BY MOUTH ONCE DAILY. 90 tablet 3  . levothyroxine (SYNTHROID, LEVOTHROID) 50 MCG tablet Take 50 mcg by mouth daily.      Marland Kitchen loperamide (IMODIUM) 2 MG capsule Take 2 mg by mouth as needed.    Marland Kitchen LORazepam (ATIVAN) 1 MG  tablet Take 1 mg by mouth as needed. For sleep    . metoprolol (LOPRESSOR) 50 MG tablet Take 50 mg by mouth daily.     Marland Kitchen NITROSTAT 0.4 MG SL tablet Place 0.4 mg under the tongue every 5 (five) minutes as needed for chest pain.     . pravastatin (PRAVACHOL) 20 MG tablet Take 20 mg by mouth daily.     Marland Kitchen Propylene Glycol (SYSTANE BALANCE OP) Apply 2 drops to eye daily as needed. FOR DRY EYES      . pseudoephedrine-guaifenesin (MUCINEX D) 60-600 MG per tablet Take 1 tablet by mouth as needed.    . temazepam (RESTORIL) 15 MG capsule Take 15 mg by mouth at bedtime as needed for sleep.    . valsartan (DIOVAN) 160 MG tablet Take 160 mg by mouth daily.     . vitamin C (ASCORBIC ACID) 500 MG tablet Take 500 mg by mouth daily.    . Vitamin D, Ergocalciferol, (DRISDOL) 50000 UNITS CAPS capsule Take 1 capsule (50,000 Units total) by mouth every 7 (seven) days. 12 capsule 0  . estradiol (ESTRACE) 0.5 MG tablet Take 1 tablet (0.5 mg total) by mouth daily. 30 tablet 5   No current facility-administered medications for this visit.    Allergies:  Shellfish allergy; Tussigon [hydrocodone-homatropine]; Hydrocodone; Glyburide; Levofloxacin; Lisinopril; Morphine; and Pantoprazole sodium [pantoprazole sodium]   Social History: The patient  reports that she has never smoked. She has never used smokeless tobacco. She reports that she does not drink alcohol or use drugs.   ROS:  Please see the history of present illness. Otherwise, complete review of systems is positive for arthritis pains.  All other systems are reviewed and negative.   Physical Exam: VS:  BP 118/64   Pulse 75   Ht 5\' 3"  (1.6 m)   Wt 158 lb (71.7 kg)   SpO2 96%   BMI 27.99 kg/m , BMI Body mass index is 27.99 kg/m.  Wt Readings from Last 3 Encounters:  12/26/15 158 lb (71.7 kg)  06/27/15 161 lb (73 kg)  01/23/15 163 lb 3.2 oz (74 kg)    General: Patient appears comfortable at rest. HEENT: Conjunctiva and lids normal, oropharynx clear with  moist mucosa. Neck: Supple, no elevated JVP or carotid bruits, no thyromegaly. Lungs: Clear to auscultation, nonlabored breathing at rest. Cardiac: Regular rate and rhythm, no S3, 2/6 basal systolic murmur, no pericardial rub. Abdomen: Soft, nontender, no hepatomegaly, bowel sounds present, no guarding or rebound. Extremities: No pitting edema, distal pulses 2+.  ECG: I personally reviewed the tracing from 06/27/2015 which showed sinus rhythm with nonspecific T-wave changes.  Recent Labwork:  October 2016: BUN 13, creatinine 0.9, potassium 4.8, AST 19, ALT 17, hemoglobin A1c 6.6, TSH 1.8, hemoglobin 13.7, platelets 287  Other Studies Reviewed Today:  Lexiscan Cardiolite 02/06/2014: FINDINGS: Baseline tracing shows sinus rhythm at 66 beats per min with nonspecific ST changes. Lexiscan bolus was given in standard fashion. Heart rate increased from 67 beats per min up to 79 beats per min, and blood pressure increased from 113/59 up to 121/71. No chest pain was reported. There were no diagnostic ST segment changes, and no arrhythmias were noted.  Analysis of the overall perfusion data finds adequate radiotracer uptake with some breast attenuation.  Perfusion: There is a moderate-sized, moderate intensity, mid to apical anteroseptal defect that is reversible and suggestive of ischemia.  Wall Motion: Normal left ventricular wall motion. No left ventricular dilation.  Left Ventricular Ejection Fraction: 72 %  End diastolic volume 61 ml  End systolic volume 17 ml  IMPRESSION: 1. Study consistent with mid to apical anteroseptal ischemia as detailed above.  2. Normal left ventricular wall motion.  3. Left ventricular ejection fraction 72%  4. Intermediate-risk stress test findings*.  Assessment and Plan:  1. History of abnormal Cardiolite and 2015 revealing mid to apical anteroseptal ischemia but normal LVEF. She has preferred conservative medical therapy and is not  reporting any angina symptoms. We will continue with observation.  2. Systolic murmur on examination, follow-up echocardiogram will be obtained to assess aortic valve.  3. Essential hypertension, blood pressure is well controlled today.  Current medicines were reviewed with the patient today.   Orders Placed This Encounter  Procedures  . ECHOCARDIOGRAM COMPLETE     Disposition: Follow-up with me in 6 months.  Signed, Satira Sark, MD, [facility] 12/26/2015 11:51 AM    [place] at [facility] [address] [place], [place] [address] Phone: [phone]; Fax: [phone]

## 2016-01-02 ENCOUNTER — Ambulatory Visit (HOSPITAL_COMMUNITY)
Admission: RE | Admit: 2016-01-02 | Discharge: 2016-01-02 | Disposition: A | Discharge status: Home / Self Care | Payer: PPO | Source: Ambulatory Visit | Attending: Cardiology | Admitting: Cardiology

## 2016-01-02 DIAGNOSIS — I083 Combined rheumatic disorders of mitral, aortic and tricuspid valves: Secondary | ICD-10-CM | POA: Insufficient documentation

## 2016-01-02 DIAGNOSIS — I700 Atherosclerosis of aorta: Secondary | ICD-10-CM | POA: Insufficient documentation

## 2016-01-02 DIAGNOSIS — R011 Cardiac murmur, unspecified: Secondary | ICD-10-CM

## 2016-01-02 DIAGNOSIS — I5030 Unspecified diastolic (congestive) heart failure: Secondary | ICD-10-CM | POA: Insufficient documentation

## 2016-01-02 NOTE — Progress Notes (Signed)
*  PRELIMINARY RESULTS* Echocardiogram 2D Echocardiogram has been performed.  Rebecca Hartman 01/02/2016, 9:07 AM

## 2016-01-16 ENCOUNTER — Other Ambulatory Visit: Payer: Self-pay | Admitting: Cardiology

## 2016-01-23 DIAGNOSIS — E1121 Type 2 diabetes mellitus with diabetic nephropathy: Secondary | ICD-10-CM | POA: Diagnosis not present

## 2016-01-23 DIAGNOSIS — I10 Essential (primary) hypertension: Secondary | ICD-10-CM | POA: Diagnosis not present

## 2016-01-23 DIAGNOSIS — Z23 Encounter for immunization: Secondary | ICD-10-CM | POA: Diagnosis not present

## 2016-01-23 DIAGNOSIS — M791 Myalgia: Secondary | ICD-10-CM | POA: Diagnosis not present

## 2016-01-28 DIAGNOSIS — I10 Essential (primary) hypertension: Secondary | ICD-10-CM | POA: Diagnosis not present

## 2016-01-28 DIAGNOSIS — M791 Myalgia: Secondary | ICD-10-CM | POA: Diagnosis not present

## 2016-01-28 DIAGNOSIS — E1121 Type 2 diabetes mellitus with diabetic nephropathy: Secondary | ICD-10-CM | POA: Diagnosis not present

## 2016-01-28 LAB — BASIC METABOLIC PANEL
BUN: 10 (ref 4–21)
Creatinine: 0.9 (ref 0.5–1.1)
Glucose: 144
Potassium: 4.1 (ref 3.4–5.3)
Sodium: 136 — AB (ref 137–147)

## 2016-01-28 LAB — HEPATIC FUNCTION PANEL
ALT: 44 — AB (ref 7–35)
AST: 48 — AB (ref 13–35)
Alkaline Phosphatase: 87 (ref 25–125)

## 2016-01-28 LAB — COMPREHENSIVE METABOLIC PANEL: Calcium: 9.3 (ref 8.7–10.7)

## 2016-01-28 LAB — TSH: TSH: 0.7 (ref <=5.90)

## 2016-02-12 ENCOUNTER — Encounter (INDEPENDENT_AMBULATORY_CARE_PROVIDER_SITE_OTHER): Payer: Self-pay | Admitting: Internal Medicine

## 2016-02-12 ENCOUNTER — Ambulatory Visit (INDEPENDENT_AMBULATORY_CARE_PROVIDER_SITE_OTHER): Payer: PPO | Admitting: Internal Medicine

## 2016-02-12 VITALS — BP 112/70 | HR 72 | Resp 18 | Ht 64.0 in | Wt 160.4 lb

## 2016-02-12 DIAGNOSIS — E164 Increased secretion of gastrin: Secondary | ICD-10-CM | POA: Diagnosis not present

## 2016-02-12 DIAGNOSIS — K219 Gastro-esophageal reflux disease without esophagitis: Secondary | ICD-10-CM

## 2016-02-12 NOTE — Patient Instructions (Addendum)
Physician will call with results of blood test when completed. Will request copy of recent blood work from Dr. Luan Pulling office.

## 2016-02-12 NOTE — Progress Notes (Signed)
Presenting complaint;  Follow-up for chronic GERD.  Database and Subjective:  Rebecca Hartman is 70 year old Caucasian female was history of peptic ulcer disease secondary to NSAID therapy chronic GERD as well as history of elevated gastrin level felt to be due to peptic ulcer disease. Gastrin level over 2 years ago was normal. She was supposed to have a follow-up test but did not. She was last seen in October 2016.  She feels well. She has heartburn no more than once a week with certain foods. She denies nausea vomiting dysphagia. On most that she has found one formed stool. Every now and then she has diarrhea and may take Imodium. She is eating fiber bars. She says her appetite is very good. Her weight is down by 3 pounds since her last visit. She has occasional LLQ abdominal pain which is also on continuous. She states she was found to have ruptured this or back but she has no pain and therefore being observed. She also complains of dropped bladder. She states she is tolerating it so far. She decided to come off tramadol because of side effects. She is not taking any for 4 days. Does not take OTC NSAIDs.      Current Medications: Outpatient Encounter Prescriptions as of 02/12/2016  Medication Sig  . diltiazem (CARDIZEM) 120 MG tablet Take 120 mg by mouth 2 (two) times daily.   . empagliflozin (JARDIANCE) 10 MG TABS tablet Take 10 mg by mouth daily.  Marland Kitchen gabapentin (NEURONTIN) 600 MG tablet Take 800 mg by mouth 2 (two) times daily.   . insulin detemir (LEVEMIR) 100 UNIT/ML injection Inject 0.25 mLs (25 Units total) into the skin at bedtime.  . isosorbide mononitrate (IMDUR) 30 MG 24 hr tablet TAKE 1 TABLET BY MOUTH ONCE DAILY.  Marland Kitchen levothyroxine (SYNTHROID, LEVOTHROID) 50 MCG tablet Take 50 mcg by mouth daily.    Marland Kitchen loperamide (IMODIUM) 2 MG capsule Take 2 mg by mouth as needed.  Marland Kitchen LORazepam (ATIVAN) 1 MG tablet Take 1 mg by mouth as needed. For sleep  . metoprolol (LOPRESSOR) 50 MG tablet Take 50 mg  by mouth daily.   Marland Kitchen NITROSTAT 0.4 MG SL tablet Place 0.4 mg under the tongue every 5 (five) minutes as needed for chest pain.   . pantoprazole (PROTONIX) 40 MG tablet Take 40 mg by mouth daily before breakfast.  . pravastatin (PRAVACHOL) 20 MG tablet Take 20 mg by mouth daily.   Marland Kitchen Propylene Glycol (SYSTANE BALANCE OP) Apply 2 drops to eye daily as needed. FOR DRY EYES    . pseudoephedrine-guaifenesin (MUCINEX D) 60-600 MG per tablet Take 1 tablet by mouth as needed.  . temazepam (RESTORIL) 15 MG capsule Take 15 mg by mouth at bedtime as needed for sleep.  . valsartan (DIOVAN) 160 MG tablet Take 160 mg by mouth daily.   . vitamin C (ASCORBIC ACID) 500 MG tablet Take 500 mg by mouth daily.  Marland Kitchen estradiol (ESTRACE) 0.5 MG tablet Take 1 tablet (0.5 mg total) by mouth daily.  . [DISCONTINUED] desoximetasone (TOPICORT) 0.25 % cream Apply 1 application topically 2 (two) times daily.   . [DISCONTINUED] Vitamin D, Ergocalciferol, (DRISDOL) 50000 UNITS CAPS capsule Take 1 capsule (50,000 Units total) by mouth every 7 (seven) days. (Patient not taking: Reported on 02/12/2016)   No facility-administered encounter medications on file as of 02/12/2016.      Objective: Blood pressure 112/70, pulse 72, resp. rate 18, height 5\' 4"  (1.626 m), weight 160 lb 6.4 oz (72.8 kg). Patient is  alert and in no acute distress. Conjunctiva is pink. Sclera is nonicteric Oropharyngeal mucosa is normal. No neck masses or thyromegaly noted. Cardiac exam with regular rhythm normal S1 and S2. Faint systolic ejection murmur noted at left sternal border. Lungs are clear to auscultation. Abdomen is full but soft and nontender without organomegaly or masses. No LE edema or clubbing noted.  Labs/studies Results: Gastrin level was 580 04/06/2012  Gastrin level was 429 on 05/27/2012  Gastrin level was 88 on 08/31/2012.   Assessment:  #1. Chronic GERD. She is doing well with therapy. #2. History of NSAID induced peptic ulcer  disease and elevated gastrin level. Gastrin level had returned to normal despite being on PPI therapy. She needs to have another level to make sure it has not gone back up. #3. History of iron deficiency anemia. Patient says recent H&H was normal.   Plan:  Continue pantoprazole at 40 mg by mouth every morning. Fasting gastrin level. Office visit in one year.

## 2016-02-13 ENCOUNTER — Ambulatory Visit: Payer: PPO

## 2016-03-06 DIAGNOSIS — Z794 Long term (current) use of insulin: Secondary | ICD-10-CM | POA: Diagnosis not present

## 2016-03-06 DIAGNOSIS — H25813 Combined forms of age-related cataract, bilateral: Secondary | ICD-10-CM | POA: Diagnosis not present

## 2016-03-06 DIAGNOSIS — Z7984 Long term (current) use of oral hypoglycemic drugs: Secondary | ICD-10-CM | POA: Diagnosis not present

## 2016-03-06 DIAGNOSIS — E119 Type 2 diabetes mellitus without complications: Secondary | ICD-10-CM | POA: Diagnosis not present

## 2016-03-19 ENCOUNTER — Ambulatory Visit
Admission: RE | Admit: 2016-03-19 | Discharge: 2016-03-19 | Disposition: A | Discharge status: Home / Self Care | Payer: PPO | Source: Ambulatory Visit | Attending: Obstetrics and Gynecology | Admitting: Obstetrics and Gynecology

## 2016-03-19 DIAGNOSIS — Z1231 Encounter for screening mammogram for malignant neoplasm of breast: Secondary | ICD-10-CM | POA: Diagnosis not present

## 2016-03-21 DIAGNOSIS — L908 Other atrophic disorders of skin: Secondary | ICD-10-CM | POA: Diagnosis not present

## 2016-03-21 DIAGNOSIS — E119 Type 2 diabetes mellitus without complications: Secondary | ICD-10-CM | POA: Diagnosis not present

## 2016-03-21 DIAGNOSIS — H25811 Combined forms of age-related cataract, right eye: Secondary | ICD-10-CM | POA: Diagnosis not present

## 2016-03-21 DIAGNOSIS — H25812 Combined forms of age-related cataract, left eye: Secondary | ICD-10-CM | POA: Diagnosis not present

## 2016-03-27 DIAGNOSIS — L281 Prurigo nodularis: Secondary | ICD-10-CM | POA: Diagnosis not present

## 2016-04-03 DIAGNOSIS — H2511 Age-related nuclear cataract, right eye: Secondary | ICD-10-CM | POA: Diagnosis not present

## 2016-04-03 DIAGNOSIS — H25811 Combined forms of age-related cataract, right eye: Secondary | ICD-10-CM | POA: Diagnosis not present

## 2016-04-17 DIAGNOSIS — H25812 Combined forms of age-related cataract, left eye: Secondary | ICD-10-CM | POA: Diagnosis not present

## 2016-04-23 DIAGNOSIS — H25812 Combined forms of age-related cataract, left eye: Secondary | ICD-10-CM | POA: Diagnosis not present

## 2016-04-23 DIAGNOSIS — H2512 Age-related nuclear cataract, left eye: Secondary | ICD-10-CM | POA: Diagnosis not present

## 2016-04-25 DIAGNOSIS — E119 Type 2 diabetes mellitus without complications: Secondary | ICD-10-CM | POA: Diagnosis not present

## 2016-04-25 DIAGNOSIS — R21 Rash and other nonspecific skin eruption: Secondary | ICD-10-CM | POA: Diagnosis not present

## 2016-04-25 DIAGNOSIS — L0390 Cellulitis, unspecified: Secondary | ICD-10-CM | POA: Diagnosis not present

## 2016-04-25 DIAGNOSIS — I10 Essential (primary) hypertension: Secondary | ICD-10-CM | POA: Diagnosis not present

## 2016-05-05 DIAGNOSIS — R21 Rash and other nonspecific skin eruption: Secondary | ICD-10-CM | POA: Diagnosis not present

## 2016-05-05 DIAGNOSIS — E119 Type 2 diabetes mellitus without complications: Secondary | ICD-10-CM | POA: Diagnosis not present

## 2016-05-05 DIAGNOSIS — I10 Essential (primary) hypertension: Secondary | ICD-10-CM | POA: Diagnosis not present

## 2016-05-22 DIAGNOSIS — Z961 Presence of intraocular lens: Secondary | ICD-10-CM | POA: Diagnosis not present

## 2016-05-26 ENCOUNTER — Other Ambulatory Visit: Payer: Self-pay | Admitting: Cardiology

## 2016-05-26 MED ORDER — NITROGLYCERIN 0.4 MG SL SUBL: 0.4000 mg | SUBLINGUAL_TABLET | SUBLINGUAL | 3 refills | Status: DC | PRN

## 2016-05-26 NOTE — Telephone Encounter (Signed)
RX for NTG to [facility]. / tg

## 2016-05-26 NOTE — Telephone Encounter (Signed)
Refilled ntg as requestes

## 2016-06-20 NOTE — Progress Notes (Deleted)
Cardiology Office Note  Date: 06/20/2016   ID: Rebecca Hartman, DOB 10/07/1945, MRN [id number]  PCP: Alonza Bogus, MD  Primary Cardiologist: Rozann Lesches, MD   No chief complaint on file.   History of Present Illness: Rebecca Hartman is a 71 y.o. female last seen in September 2017.  She has a history of mid to apical anteroseptal ischemia by Cardiolite study in 2015. This has been managed conservatively on medical therapy at her request.  Follow-up echocardiogram from October of last year is outlined below.  Past Medical History:  Diagnosis Date  . Abnormal myocardial perfusion study    November 2015, mid to apical anteroseptal ischemia   . Anemia   . Anxiety   . Chronic back pain   . Essential hypertension   . Fibromyalgia   . Hiatal hernia   . Hypercholesteremia   . Hypothyroidism   . Type 2 diabetes mellitus ([place])     Past Surgical History:  Procedure Laterality Date  . ABDOMINAL HYSTERECTOMY    . BACK SURGERY    . CARPAL TUNNEL RELEASE    . CHOLECYSTECTOMY    . COLONOSCOPY  08/06/06 FIELDS  . DENTAL SURGERY  04/2015   reconstructive surgery lower  . ESOPHAGOGASTRODUODENOSCOPY  10/31/2010   Procedure: ESOPHAGOGASTRODUODENOSCOPY (EGD);  Surgeon: [name], MD;  [facility];  Service: Endoscopy;  Laterality: N/A;  . ESOPHAGOGASTRODUODENOSCOPY  03/05/2011   Procedure: ESOPHAGOGASTRODUODENOSCOPY (EGD);  Surgeon: [name], MD;  [facility];  Service: Endoscopy;  Laterality: N/A;  12:00  . ESOPHAGOGASTRODUODENOSCOPY  04/28/2012   Procedure: ESOPHAGOGASTRODUODENOSCOPY (EGD);  Surgeon: [name], MD;  [facility];  Service: Endoscopy;  Laterality: N/A;  140-moved to 10:30 Ann notified pt  . TONSILLECTOMY    . TOTAL HIP REVISION    . UPPER GASTROINTESTINAL ENDOSCOPY  10/03/2010   EGD DILATN PYLORIC CHANNEL  . UPPER GASTROINTESTINAL ENDOSCOPY  07/08/2010  . UPPER GASTROINTESTINAL ENDOSCOPY  08/06/06   FIELDS    . UPPER GASTROINTESTINAL ENDOSCOPY  06/29/2008   EGD    Current Outpatient Prescriptions  Medication Sig Dispense Refill  . diltiazem (CARDIZEM) 120 MG tablet Take 120 mg by mouth 2 (two) times daily.     . empagliflozin (JARDIANCE) 10 MG TABS tablet Take 10 mg by mouth daily.    Marland Kitchen estradiol (ESTRACE) 0.5 MG tablet Take 1 tablet (0.5 mg total) by mouth daily. 30 tablet 5  . gabapentin (NEURONTIN) 600 MG tablet Take 800 mg by mouth 2 (two) times daily.     . insulin detemir (LEVEMIR) 100 UNIT/ML injection Inject 0.25 mLs (25 Units total) into the skin at bedtime. 15 mL 2  . isosorbide mononitrate (IMDUR) 30 MG 24 hr tablet TAKE 1 TABLET BY MOUTH ONCE DAILY. 90 tablet 2  . levothyroxine (SYNTHROID, LEVOTHROID) 50 MCG tablet Take 50 mcg by mouth daily.      Marland Kitchen loperamide (IMODIUM) 2 MG capsule Take 2 mg by mouth as needed.    Marland Kitchen LORazepam (ATIVAN) 1 MG tablet Take 1 mg by mouth as needed. For sleep    . metoprolol (LOPRESSOR) 50 MG tablet Take 50 mg by mouth daily.     . nitroGLYCERIN (NITROSTAT) 0.4 MG SL tablet Place 1 tablet (0.4 mg total) under the tongue every 5 (five) minutes as needed for chest pain. 25 tablet 3  . pantoprazole (PROTONIX) 40 MG tablet Take 40 mg by mouth daily before breakfast.    . pravastatin (PRAVACHOL)  20 MG tablet Take 20 mg by mouth daily.     Marland Kitchen Propylene Glycol (SYSTANE BALANCE OP) Apply 2 drops to eye daily as needed. FOR DRY EYES      . pseudoephedrine-guaifenesin (MUCINEX D) 60-600 MG per tablet Take 1 tablet by mouth as needed.    . temazepam (RESTORIL) 15 MG capsule Take 15 mg by mouth at bedtime as needed for sleep.    . valsartan (DIOVAN) 160 MG tablet Take 160 mg by mouth daily.     . vitamin C (ASCORBIC ACID) 500 MG tablet Take 500 mg by mouth daily.     No current facility-administered medications for this visit.    Allergies:  Shellfish allergy; Tussigon [hydrocodone-homatropine]; Hydrocodone; Glyburide; Levofloxacin; Lisinopril; Morphine; and  Pantoprazole sodium [pantoprazole sodium]   Social History: The patient  reports that she has never smoked. She has never used smokeless tobacco. She reports that she does not drink alcohol or use drugs.   Family History: The patient's family history includes Diabetes in her mother; Healthy in her son; Stroke in her father and mother.   ROS:  Please see the history of present illness. Otherwise, complete review of systems is positive for {NONE DEFAULTED:18576::"none"}.  All other systems are reviewed and negative.   Physical Exam: VS:  There were no vitals taken for this visit., BMI There is no height or weight on file to calculate BMI.  Wt Readings from Last 3 Encounters:  02/12/16 160 lb 6.4 oz (72.8 kg)  12/26/15 158 lb (71.7 kg)  06/27/15 161 lb (73 kg)    General: Patient appears comfortable at rest. HEENT: Conjunctiva and lids normal, oropharynx clear with moist mucosa. Neck: Supple, no elevated JVP or carotid bruits, no thyromegaly. Lungs: Clear to auscultation, nonlabored breathing at rest. Cardiac: Regular rate and rhythm, no S3, 2/6 basal systolic murmur, no pericardial rub. Abdomen: Soft, nontender, no hepatomegaly, bowel sounds present, no guarding or rebound. Extremities: No pitting edema, distal pulses 2+.  ECG: I personally reviewed the tracing from 06/27/2015 which showed sinus rhythm and nonspecific T-wave changes.  Recent Labwork:  October 2016: BUN 13, creatinine 0.9, potassium 4.8, AST 19, ALT 17, hemoglobin A1c 6.6, TSH 1.8, hemoglobin 13.7, platelets 287  Other Studies Reviewed Today:  Lexiscan Cardiolite 02/06/2014: FINDINGS: Baseline tracing shows sinus rhythm at 66 beats per min with nonspecific ST changes. Lexiscan bolus was given in standard fashion. Heart rate increased from 67 beats per min up to 79 beats per min, and blood pressure increased from 113/59 up to 121/71. No chest pain was reported. There were no diagnostic ST segment changes, and no  arrhythmias were noted.  Analysis of the overall perfusion data finds adequate radiotracer uptake with some breast attenuation.  Perfusion: There is a moderate-sized, moderate intensity, mid to apical anteroseptal defect that is reversible and suggestive of ischemia.  Wall Motion: Normal left ventricular wall motion. No left ventricular dilation.  Left Ventricular Ejection Fraction: 72 %  End diastolic volume 61 ml  End systolic volume 17 ml  IMPRESSION: 1. Study consistent with mid to apical anteroseptal ischemia as detailed above.  2. Normal left ventricular wall motion.  3. Left ventricular ejection fraction 72%  4. Intermediate-risk stress test findings*.  Echocardiogram 01/02/2016: Study Conclusions  - Left ventricle: The cavity size was normal. Wall thickness was   increased in a pattern of mild LVH. Systolic function was normal.   The estimated ejection fraction was in the range of 60% to 65%.   Wall  motion was normal; there were no regional wall motion   abnormalities. Doppler parameters are consistent with abnormal   left ventricular relaxation (grade 1 diastolic dysfunction). - Aortic valve: Mildly calcified annulus. Trileaflet; mildly   calcified leaflets. There was mild regurgitation. Mean gradient   (S): 7 mm Hg. Peak gradient (S): 16 mm Hg. Valve area (VTI): 2.21   cm^2. - Mitral valve: Calcified annulus. There was trivial regurgitation. - Left atrium: The atrium was mildly dilated. - Right atrium: Central venous pressure (est): 3 mm Hg. - Tricuspid valve: There was trivial regurgitation. - Pulmonary arteries: PA peak pressure: 19 mm Hg (S). - Pericardium, extracardiac: There was no pericardial effusion.  Impressions:  - Mild LVH with LVEF 60-65%. Grade 1 diastolic dysfunction. Mild   left atrial enlargement. MAC with trivial mitral regurgitation.   Mildly calcified aortic valve with mild aortic regurgitation.   Trivial tricuspid  regurgitation with normal estimated PASP.  Assessment and Plan:   Current medicines were reviewed with the patient today.  No orders of the defined types were placed in this encounter.   Disposition:  Signed, Satira Sark, MD, [facility] 06/20/2016 4:22 PM    [facility] at [facility] [address] [place], [place] [address] Phone: [phone]; Fax: [phone]

## 2016-06-23 DIAGNOSIS — E1121 Type 2 diabetes mellitus with diabetic nephropathy: Secondary | ICD-10-CM | POA: Diagnosis not present

## 2016-06-23 DIAGNOSIS — I10 Essential (primary) hypertension: Secondary | ICD-10-CM | POA: Diagnosis not present

## 2016-06-23 DIAGNOSIS — J309 Allergic rhinitis, unspecified: Secondary | ICD-10-CM | POA: Diagnosis not present

## 2016-06-23 DIAGNOSIS — R21 Rash and other nonspecific skin eruption: Secondary | ICD-10-CM | POA: Diagnosis not present

## 2016-06-24 ENCOUNTER — Ambulatory Visit: Payer: PPO | Admitting: Cardiology

## 2016-07-04 DIAGNOSIS — M791 Myalgia: Secondary | ICD-10-CM | POA: Diagnosis not present

## 2016-07-04 DIAGNOSIS — E1121 Type 2 diabetes mellitus with diabetic nephropathy: Secondary | ICD-10-CM | POA: Diagnosis not present

## 2016-07-04 DIAGNOSIS — I10 Essential (primary) hypertension: Secondary | ICD-10-CM | POA: Diagnosis not present

## 2016-07-04 DIAGNOSIS — R21 Rash and other nonspecific skin eruption: Secondary | ICD-10-CM | POA: Diagnosis not present

## 2016-07-07 DIAGNOSIS — I10 Essential (primary) hypertension: Secondary | ICD-10-CM | POA: Diagnosis not present

## 2016-07-07 DIAGNOSIS — E1121 Type 2 diabetes mellitus with diabetic nephropathy: Secondary | ICD-10-CM | POA: Diagnosis not present

## 2016-07-07 DIAGNOSIS — R21 Rash and other nonspecific skin eruption: Secondary | ICD-10-CM | POA: Diagnosis not present

## 2016-07-07 DIAGNOSIS — M791 Myalgia: Secondary | ICD-10-CM | POA: Diagnosis not present

## 2016-07-07 LAB — COMPREHENSIVE METABOLIC PANEL: Calcium: 8.6 — AB (ref 8.7–10.7)

## 2016-07-07 LAB — CBC AND DIFFERENTIAL
HCT: 41 (ref 36–46)
Hemoglobin: 13.8 (ref 12.0–16.0)
Platelets: 235 (ref 150–399)
WBC: 17.1

## 2016-07-07 LAB — HEMOGLOBIN A1C: Hemoglobin A1C: 7.2

## 2016-07-07 LAB — BASIC METABOLIC PANEL
BUN: 19 (ref 4–21)
Creatinine: 1 (ref <=1.1)
Glucose: 135
Potassium: 4.2 (ref 3.4–5.3)
Sodium: 136 — AB (ref 137–147)

## 2016-07-07 LAB — HEPATIC FUNCTION PANEL
ALT: 54 — AB (ref 7–35)
AST: 26 (ref 13–35)
Alkaline Phosphatase: 63 (ref 25–125)

## 2016-07-15 ENCOUNTER — Other Ambulatory Visit: Payer: Self-pay | Admitting: Cardiology

## 2016-07-16 NOTE — Progress Notes (Signed)
Cardiology Office Note  Date: 07/17/2016   ID: Rebecca Hartman, DOB 1945/10/02, MRN [id number]  PCP: Alonza Bogus, MD  Primary Cardiologist: Rozann Lesches, MD   Chief Complaint  Patient presents with  . Cardiac follow-up    History of Present Illness: Rebecca Hartman is a 71 y.o. female last seen in September 2017. She presents today for a routine follow-up visit. Reports no angina symptoms or unusual shortness of breath. She states that she is trying to get over a sinus infection, has had a lot of drainage and trouble with the pollen season. She is seeing Dr. Luan Pulling for this.  Follow-up echocardiogram from October 2017 is reviewed below. Aortic valve was sclerotic but not stenotic and there was only mild aortic regurgitation.  I personally reviewed her ECG today which shows sinus rhythm.  Current medications include Diovan, Pravachol, Lopressor, Cardizem CD, Imdur, and as needed nitroglycerin.  Past Medical History:  Diagnosis Date  . Abnormal myocardial perfusion study    November 2015, mid to apical anteroseptal ischemia   . Anemia   . Anxiety   . Chronic back pain   . Essential hypertension   . Fibromyalgia   . Hiatal hernia   . Hypercholesteremia   . Hypothyroidism   . Type 2 diabetes mellitus ([place])     Past Surgical History:  Procedure Laterality Date  . ABDOMINAL HYSTERECTOMY    . BACK SURGERY    . CARPAL TUNNEL RELEASE    . CHOLECYSTECTOMY    . COLONOSCOPY  08/06/06 FIELDS  . DENTAL SURGERY  04/2015   reconstructive surgery lower  . ESOPHAGOGASTRODUODENOSCOPY  10/31/2010   Procedure: ESOPHAGOGASTRODUODENOSCOPY (EGD);  Surgeon: [name], MD;  [facility];  Service: Endoscopy;  Laterality: N/A;  . ESOPHAGOGASTRODUODENOSCOPY  03/05/2011   Procedure: ESOPHAGOGASTRODUODENOSCOPY (EGD);  Surgeon: [name], MD;  [facility];  Service: Endoscopy;  Laterality: N/A;  12:00  . ESOPHAGOGASTRODUODENOSCOPY  04/28/2012   Procedure: ESOPHAGOGASTRODUODENOSCOPY (EGD);  Surgeon: [name], MD;  [facility];  Service: Endoscopy;  Laterality: N/A;  140-moved to 10:30 Ann notified pt  . TONSILLECTOMY    . TOTAL HIP REVISION    . UPPER GASTROINTESTINAL ENDOSCOPY  10/03/2010   EGD DILATN PYLORIC CHANNEL  . UPPER GASTROINTESTINAL ENDOSCOPY  07/08/2010  . UPPER GASTROINTESTINAL ENDOSCOPY  08/06/06   FIELDS  . UPPER GASTROINTESTINAL ENDOSCOPY  06/29/2008   EGD    Current Outpatient Prescriptions  Medication Sig Dispense Refill  . diltiazem (CARDIZEM) 120 MG tablet Take 120 mg by mouth 2 (two) times daily.     . empagliflozin (JARDIANCE) 10 MG TABS tablet Take 10 mg by mouth daily.    Marland Kitchen gabapentin (NEURONTIN) 600 MG tablet Take 800 mg by mouth 2 (two) times daily.     . insulin detemir (LEVEMIR) 100 UNIT/ML injection Inject 0.25 mLs (25 Units total) into the skin at bedtime. 15 mL 2  . isosorbide mononitrate (IMDUR) 30 MG 24 hr tablet TAKE 1 TABLET BY MOUTH ONCE DAILY. 90 tablet 0  . levothyroxine (SYNTHROID, LEVOTHROID) 50 MCG tablet Take 50 mcg by mouth daily.      Marland Kitchen loperamide (IMODIUM) 2 MG capsule Take 2 mg by mouth as needed.    Marland Kitchen LORazepam (ATIVAN) 1 MG tablet Take 1 mg by mouth as needed. For sleep    . metoprolol (LOPRESSOR) 50 MG tablet Take 50 mg by mouth daily.     . nitroGLYCERIN (NITROSTAT) 0.4 MG SL  tablet Place 1 tablet (0.4 mg total) under the tongue every 5 (five) minutes as needed for chest pain. 25 tablet 3  . ondansetron (ZOFRAN) 4 MG tablet Take 4 mg by mouth every 8 (eight) hours as needed for nausea or vomiting.    . pantoprazole (PROTONIX) 40 MG tablet Take 40 mg by mouth daily before breakfast.    . pravastatin (PRAVACHOL) 20 MG tablet Take 20 mg by mouth daily.     Marland Kitchen Propylene Glycol (SYSTANE BALANCE OP) Apply 2 drops to eye daily as needed. FOR DRY EYES      . pseudoephedrine-guaifenesin (MUCINEX D) 60-600 MG per tablet Take 1 tablet by mouth as needed.    .  sulfamethoxazole-trimethoprim (BACTRIM DS,SEPTRA DS) 800-160 MG tablet     . temazepam (RESTORIL) 15 MG capsule Take 15 mg by mouth at bedtime as needed for sleep.    . valsartan (DIOVAN) 160 MG tablet Take 160 mg by mouth daily.     . vitamin C (ASCORBIC ACID) 500 MG tablet Take 500 mg by mouth daily.     No current facility-administered medications for this visit.    Allergies:  Shellfish allergy; Tussigon [hydrocodone-homatropine]; Hydrocodone; Glyburide; Levofloxacin; Lisinopril; Morphine; and Pantoprazole sodium [pantoprazole sodium]   Social History: The patient  reports that she has never smoked. She has never used smokeless tobacco. She reports that she does not drink alcohol or use drugs.   ROS:  Please see the history of present illness. Otherwise, complete review of systems is positive for none.  All other systems are reviewed and negative.   Physical Exam: VS:  BP 126/72   Pulse 67   Ht 5' 3"  (1.6 m)   Wt 163 lb (73.9 kg)   SpO2 98%   BMI 28.87 kg/m , BMI Body mass index is 28.87 kg/m.  Wt Readings from Last 3 Encounters:  07/17/16 163 lb (73.9 kg)  02/12/16 160 lb 6.4 oz (72.8 kg)  12/26/15 158 lb (71.7 kg)    General: Patient appears comfortable at rest. HEENT: Conjunctiva and lids normal, oropharynx clear with moist mucosa. Neck: Supple, no elevated JVP or carotid bruits, no thyromegaly. Lungs: Clear to auscultation, nonlabored breathing at rest. Cardiac: Regular rate and rhythm, no S3, 2/6 basal systolic murmur, no pericardial rub. Abdomen: Soft, nontender, no hepatomegaly, bowel sounds present, no guarding or rebound. Extremities: No pitting edema, distal pulses 2+. Skin: Warm and dry. Musculoskeletal: No kyphosis per Neuropsychiatric: Alert and oriented 3, affect appropriate.  ECG: I personally reviewed the tracing from 06/27/2015 showed sinus rhythm with nonspecific T-wave changes.  Recent Labwork:  October 2016: BUN 13, creatinine 0.9, potassium 4.8, AST  19, ALT 17, hemoglobin A1c 6.6, TSH 1.8, hemoglobin 13.7, platelets 287  Other Studies Reviewed Today:  Lexiscan Cardiolite 02/06/2014: FINDINGS: Baseline tracing shows sinus rhythm at 66 beats per min with nonspecific ST changes. Lexiscan bolus was given in standard fashion. Heart rate increased from 67 beats per min up to 79 beats per min, and blood pressure increased from 113/59 up to 121/71. No chest pain was reported. There were no diagnostic ST segment changes, and no arrhythmias were noted.  Analysis of the overall perfusion data finds adequate radiotracer uptake with some breast attenuation.  Perfusion: There is a moderate-sized, moderate intensity, mid to apical anteroseptal defect that is reversible and suggestive of ischemia.  Wall Motion: Normal left ventricular wall motion. No left ventricular dilation.  Left Ventricular Ejection Fraction: 72 %  End diastolic volume 61 ml  End systolic volume 17 ml  IMPRESSION: 1. Study consistent with mid to apical anteroseptal ischemia as detailed above.  2. Normal left ventricular wall motion.  3. Left ventricular ejection fraction 72%  4. Intermediate-risk stress test findings*.  Echocardiogram 01/02/2016: Study Conclusions  - Left ventricle: The cavity size was normal. Wall thickness was   increased in a pattern of mild LVH. Systolic function was normal.   The estimated ejection fraction was in the range of 60% to 65%.   Wall motion was normal; there were no regional wall motion   abnormalities. Doppler parameters are consistent with abnormal   left ventricular relaxation (grade 1 diastolic dysfunction). - Aortic valve: Mildly calcified annulus. Trileaflet; mildly   calcified leaflets. There was mild regurgitation. Mean gradient   (S): 7 mm Hg. Peak gradient (S): 16 mm Hg. Valve area (VTI): 2.21   cm^2. - Mitral valve: Calcified annulus. There was trivial regurgitation. - Left atrium: The atrium was  mildly dilated. - Right atrium: Central venous pressure (est): 3 mm Hg. - Tricuspid valve: There was trivial regurgitation. - Pulmonary arteries: PA peak pressure: 19 mm Hg (S). - Pericardium, extracardiac: There was no pericardial effusion.  Impressions:  - Mild LVH with LVEF 60-65%. Grade 1 diastolic dysfunction. Mild   left atrial enlargement. MAC with trivial mitral regurgitation.   Mildly calcified aortic valve with mild aortic regurgitation.   Trivial tricuspid regurgitation with normal estimated PASP.  Assessment and Plan:  1. Ischemic heart disease based on previous abnormal Myoview study from 2015. She does not report any angina on medical therapy and we have continued with observation, holding off on invasive cardiac testing unless symptoms escalate. ECG today is normal.  2. Hyperlipidemia, continues on Pravachol.  3. Essential hypertension, blood pressure adequately controlled today. She continues on Diovan, Lopressor, and Cardizem CD.  4. Sclerotic aortic valve with mild aortic regurgitation but no stenosis. No change on examination and asymptomatic.  Current medicines were reviewed with the patient today.   Orders Placed This Encounter  Procedures  . EKG 12-Lead    Disposition: Follow-up in 6 months.  Signed, Satira Sark, MD, [facility] 07/17/2016 11:01 AM    [place] at [facility] [address], [place], [place] [address] Phone: [phone]; Fax: [phone]

## 2016-07-17 ENCOUNTER — Encounter: Payer: Self-pay | Admitting: Cardiology

## 2016-07-17 ENCOUNTER — Ambulatory Visit (INDEPENDENT_AMBULATORY_CARE_PROVIDER_SITE_OTHER): Payer: PPO | Admitting: Cardiology

## 2016-07-17 VITALS — BP 126/72 | HR 67 | Ht 63.0 in | Wt 163.0 lb

## 2016-07-17 DIAGNOSIS — I10 Essential (primary) hypertension: Secondary | ICD-10-CM | POA: Diagnosis not present

## 2016-07-17 DIAGNOSIS — R9439 Abnormal result of other cardiovascular function study: Secondary | ICD-10-CM | POA: Diagnosis not present

## 2016-07-17 DIAGNOSIS — E782 Mixed hyperlipidemia: Secondary | ICD-10-CM | POA: Diagnosis not present

## 2016-07-17 DIAGNOSIS — I358 Other nonrheumatic aortic valve disorders: Secondary | ICD-10-CM

## 2016-07-17 NOTE — Patient Instructions (Signed)
Your physician wants you to follow-up in: 6 months You will receive a reminder letter in the mail two months in advance. If you don't receive a letter, please call our office to schedule the follow-up appointment.    Your physician recommends that you continue on your current medications as directed. Please refer to the Current Medication list given to you today.    If you need a refill on your cardiac medications before your next appointment, please call your pharmacy.     Thank you for choosing [facility] !        

## 2016-08-08 DIAGNOSIS — R5383 Other fatigue: Secondary | ICD-10-CM | POA: Diagnosis not present

## 2016-08-08 DIAGNOSIS — M150 Primary generalized (osteo)arthritis: Secondary | ICD-10-CM | POA: Diagnosis not present

## 2016-08-08 DIAGNOSIS — R21 Rash and other nonspecific skin eruption: Secondary | ICD-10-CM | POA: Diagnosis not present

## 2016-08-08 DIAGNOSIS — Z6828 Body mass index (BMI) 28.0-28.9, adult: Secondary | ICD-10-CM | POA: Diagnosis not present

## 2016-08-08 DIAGNOSIS — E663 Overweight: Secondary | ICD-10-CM | POA: Diagnosis not present

## 2016-08-08 DIAGNOSIS — M2550 Pain in unspecified joint: Secondary | ICD-10-CM | POA: Diagnosis not present

## 2016-08-08 DIAGNOSIS — M3500 Sicca syndrome, unspecified: Secondary | ICD-10-CM | POA: Diagnosis not present

## 2016-08-21 DIAGNOSIS — E039 Hypothyroidism, unspecified: Secondary | ICD-10-CM | POA: Diagnosis not present

## 2016-08-21 DIAGNOSIS — E1121 Type 2 diabetes mellitus with diabetic nephropathy: Secondary | ICD-10-CM | POA: Diagnosis not present

## 2016-08-21 DIAGNOSIS — I10 Essential (primary) hypertension: Secondary | ICD-10-CM | POA: Diagnosis not present

## 2016-08-21 DIAGNOSIS — L989 Disorder of the skin and subcutaneous tissue, unspecified: Secondary | ICD-10-CM | POA: Diagnosis not present

## 2016-08-29 DIAGNOSIS — L281 Prurigo nodularis: Secondary | ICD-10-CM | POA: Diagnosis not present

## 2016-08-29 DIAGNOSIS — D485 Neoplasm of uncertain behavior of skin: Secondary | ICD-10-CM | POA: Diagnosis not present

## 2016-09-01 DIAGNOSIS — J301 Allergic rhinitis due to pollen: Secondary | ICD-10-CM | POA: Diagnosis not present

## 2016-09-01 DIAGNOSIS — E1121 Type 2 diabetes mellitus with diabetic nephropathy: Secondary | ICD-10-CM | POA: Diagnosis not present

## 2016-09-01 DIAGNOSIS — J209 Acute bronchitis, unspecified: Secondary | ICD-10-CM | POA: Diagnosis not present

## 2016-09-01 DIAGNOSIS — I10 Essential (primary) hypertension: Secondary | ICD-10-CM | POA: Diagnosis not present

## 2016-09-05 DIAGNOSIS — M064 Inflammatory polyarthropathy: Secondary | ICD-10-CM | POA: Diagnosis not present

## 2016-09-05 DIAGNOSIS — M3500 Sicca syndrome, unspecified: Secondary | ICD-10-CM | POA: Diagnosis not present

## 2016-09-05 DIAGNOSIS — M150 Primary generalized (osteo)arthritis: Secondary | ICD-10-CM | POA: Diagnosis not present

## 2016-09-05 DIAGNOSIS — M7989 Other specified soft tissue disorders: Secondary | ICD-10-CM | POA: Diagnosis not present

## 2016-09-05 DIAGNOSIS — R21 Rash and other nonspecific skin eruption: Secondary | ICD-10-CM | POA: Diagnosis not present

## 2016-09-05 DIAGNOSIS — Z6827 Body mass index (BMI) 27.0-27.9, adult: Secondary | ICD-10-CM | POA: Diagnosis not present

## 2016-09-05 DIAGNOSIS — M2550 Pain in unspecified joint: Secondary | ICD-10-CM | POA: Diagnosis not present

## 2016-09-05 DIAGNOSIS — E663 Overweight: Secondary | ICD-10-CM | POA: Diagnosis not present

## 2016-10-21 DIAGNOSIS — E1121 Type 2 diabetes mellitus with diabetic nephropathy: Secondary | ICD-10-CM | POA: Diagnosis not present

## 2016-10-21 DIAGNOSIS — J309 Allergic rhinitis, unspecified: Secondary | ICD-10-CM | POA: Diagnosis not present

## 2016-10-21 DIAGNOSIS — I10 Essential (primary) hypertension: Secondary | ICD-10-CM | POA: Diagnosis not present

## 2016-10-21 DIAGNOSIS — G4700 Insomnia, unspecified: Secondary | ICD-10-CM | POA: Diagnosis not present

## 2016-12-09 DIAGNOSIS — M064 Inflammatory polyarthropathy: Secondary | ICD-10-CM | POA: Diagnosis not present

## 2016-12-09 DIAGNOSIS — M150 Primary generalized (osteo)arthritis: Secondary | ICD-10-CM | POA: Diagnosis not present

## 2016-12-09 DIAGNOSIS — M3500 Sicca syndrome, unspecified: Secondary | ICD-10-CM | POA: Diagnosis not present

## 2016-12-09 DIAGNOSIS — E663 Overweight: Secondary | ICD-10-CM | POA: Diagnosis not present

## 2016-12-09 DIAGNOSIS — Z6827 Body mass index (BMI) 27.0-27.9, adult: Secondary | ICD-10-CM | POA: Diagnosis not present

## 2016-12-09 DIAGNOSIS — M2550 Pain in unspecified joint: Secondary | ICD-10-CM | POA: Diagnosis not present

## 2016-12-09 DIAGNOSIS — R21 Rash and other nonspecific skin eruption: Secondary | ICD-10-CM | POA: Diagnosis not present

## 2016-12-15 DIAGNOSIS — I10 Essential (primary) hypertension: Secondary | ICD-10-CM | POA: Diagnosis not present

## 2016-12-15 DIAGNOSIS — E119 Type 2 diabetes mellitus without complications: Secondary | ICD-10-CM | POA: Diagnosis not present

## 2016-12-15 DIAGNOSIS — R21 Rash and other nonspecific skin eruption: Secondary | ICD-10-CM | POA: Diagnosis not present

## 2016-12-15 DIAGNOSIS — M069 Rheumatoid arthritis, unspecified: Secondary | ICD-10-CM | POA: Diagnosis not present

## 2016-12-26 ENCOUNTER — Encounter (INDEPENDENT_AMBULATORY_CARE_PROVIDER_SITE_OTHER): Payer: Self-pay

## 2016-12-26 ENCOUNTER — Encounter (INDEPENDENT_AMBULATORY_CARE_PROVIDER_SITE_OTHER): Payer: Self-pay | Admitting: Internal Medicine

## 2017-01-07 ENCOUNTER — Other Ambulatory Visit: Payer: Self-pay | Admitting: Cardiology

## 2017-01-19 DIAGNOSIS — R21 Rash and other nonspecific skin eruption: Secondary | ICD-10-CM | POA: Diagnosis not present

## 2017-01-19 DIAGNOSIS — E119 Type 2 diabetes mellitus without complications: Secondary | ICD-10-CM | POA: Diagnosis not present

## 2017-01-19 DIAGNOSIS — I10 Essential (primary) hypertension: Secondary | ICD-10-CM | POA: Diagnosis not present

## 2017-01-19 DIAGNOSIS — M069 Rheumatoid arthritis, unspecified: Secondary | ICD-10-CM | POA: Diagnosis not present

## 2017-01-19 LAB — HEMOGLOBIN A1C: Hemoglobin A1C: 5.6

## 2017-01-20 NOTE — Progress Notes (Signed)
Cardiology Office Note  Date: 01/21/2017   ID: Rebecca Hartman, DOB 1945-08-30, MRN [id number]  PCP: Sinda Du, MD  Primary Cardiologist: Rozann Lesches, MD   Chief Complaint  Patient presents with  . Cardiac follow-up    History of Present Illness: Rebecca Hartman is a 71 y.o. female last seen in April. She presents today for a routine follow-up visit. She states that she has not had any angina symptoms or increasing shortness of breath. She stays busy, aches care of 2 grandchildren getting them to activities. She has had no major change in her stamina.  I reviewed her medications which are outlined below. Regimen includes Cardizem CD, Imdur, Lopressor, Pravachol, Diovan, and as needed nitroglycerin which she has not needed so far. She is also taking a baby aspirin daily, reports no bleeding problems.  She states that she had recent lab work done by Dr. Luan Pulling. We are requesting the results.  Past Medical History:  Diagnosis Date  . Abnormal myocardial perfusion study    November 2015, mid to apical anteroseptal ischemia   . Anemia   . Anxiety   . Chronic back pain   . Essential hypertension   . Fibromyalgia   . Hiatal hernia   . Hypercholesteremia   . Hypothyroidism   . Type 2 diabetes mellitus ([place])     Past Surgical History:  Procedure Laterality Date  . ABDOMINAL HYSTERECTOMY    . BACK SURGERY    . CARPAL TUNNEL RELEASE    . CHOLECYSTECTOMY    . COLONOSCOPY  08/06/06 [facility]ELDS  . DENTAL SURGERY  04/2015   reconstructive surgery lower  . ESOPHAGOGASTRODUODENOSCOPY  10/31/2010   Procedure: ESOPHAGOGASTRODUODENOSCOPY (EGD);  Surgeon: [name], MD;  [facility];  Service: Endoscopy;  Laterality: N/A;  . ESOPHAGOGASTRODUODENOSCOPY  03/05/2011   Procedure: ESOPHAGOGASTRODUODENOSCOPY (EGD);  Surgeon: [name], MD;  [facility];  Service: Endoscopy;  Laterality: N/A;  12:00  . ESOPHAGOGASTRODUODENOSCOPY  04/28/2012   Procedure:  ESOPHAGOGASTRODUODENOSCOPY (EGD);  Surgeon: [name], MD;  [facility];  Service: Endoscopy;  Laterality: N/A;  140-moved to 10:30 Ann notified pt  . TONSILLECTOMY    . TOTAL HIP REVISION    . UPPER GASTROINTESTINAL ENDOSCOPY  10/03/2010   EGD DILATN PYLORIC CHANNEL  . UPPER GASTROINTESTINAL ENDOSCOPY  07/08/2010  . UPPER GASTROINTESTINAL ENDOSCOPY  08/06/06   FIELDS  . UPPER GASTROINTESTINAL ENDOSCOPY  06/29/2008   EGD    Current Outpatient Prescriptions  Medication Sig Dispense Refill  . diltiazem (CARDIZEM) 120 MG tablet Take 120 mg by mouth 2 (two) times daily.     . empagliflozin (JARDIANCE) 10 MG TABS tablet Take 10 mg by mouth daily.    Marland Kitchen gabapentin (NEURONTIN) 600 MG tablet Take 800 mg by mouth 2 (two) times daily.     . hydroxychloroquine (PLAQUENIL) 200 MG tablet Take 200 mg by mouth 2 (two) times daily.     . insulin detemir (LEVEMIR) 100 UNIT/ML injection Inject 0.25 mLs (25 Units total) into the skin at bedtime. 15 mL 2  . isosorbide mononitrate (IMDUR) 30 MG 24 hr tablet TAKE 1 TABLET BY MOUTH ONCE DAILY. 90 tablet 3  . levothyroxine (SYNTHROID, LEVOTHROID) 50 MCG tablet Take 50 mcg by mouth daily.      Marland Kitchen loperamide (IMODIUM) 2 MG capsule Take 2 mg by mouth as needed.    Marland Kitchen LORazepam (ATIVAN) 1 MG tablet Take 1 mg by mouth as needed. For sleep    .  metoprolol (LOPRESSOR) 50 MG tablet Take 50 mg by mouth daily.     . nitroGLYCERIN (NITROSTAT) 0.4 MG SL tablet Place 1 tablet (0.4 mg total) under the tongue every 5 (five) minutes as needed for chest pain. 25 tablet 3  . ondansetron (ZOFRAN) 4 MG tablet Take 4 mg by mouth every 8 (eight) hours as needed for nausea or vomiting.    . pantoprazole (PROTONIX) 40 MG tablet Take 40 mg by mouth daily before breakfast.    . pravastatin (PRAVACHOL) 20 MG tablet Take 20 mg by mouth daily.     Marland Kitchen Propylene Glycol (SYSTANE BALANCE OP) Apply 2 drops to eye daily as needed. FOR DRY EYES      . pseudoephedrine-guaifenesin  (MUCINEX D) 60-600 MG per tablet Take 1 tablet by mouth as needed.    . sulfamethoxazole-trimethoprim (BACTRIM DS,SEPTRA DS) 800-160 MG tablet     . temazepam (RESTORIL) 15 MG capsule Take 15 mg by mouth at bedtime as needed for sleep.    . valsartan (DIOVAN) 160 MG tablet Take 160 mg by mouth daily.     . vitamin C (ASCORBIC ACID) 500 MG tablet Take 500 mg by mouth daily.     No current facility-administered medications for this visit.    Allergies:  Shellfish allergy; Tussigon [hydrocodone-homatropine]; Hydrocodone; Glyburide; Levofloxacin; Lisinopril; Morphine; and Pantoprazole sodium [pantoprazole sodium]   Social History: The patient  reports that she has never smoked. She has never used smokeless tobacco. She reports that she does not drink alcohol or use drugs.   ROS:  Please see the history of present illness. Otherwise, complete review of systems is positive for arthritic symptoms, she follows with a rheumatologist.  All other systems are reviewed and negative.   Physical Exam: VS:  BP 136/68 (BP Location: Right Arm)   Pulse 71   Ht _0  (1.676 m)   Wt 156 lb (70.8 kg)   SpO2 98%   BMI 25.18 kg/m , BMI Body mass index is 25.18 kg/m.  Wt Readings from Last 3 Encounters:  01/21/17 156 lb (70.8 kg)  07/17/16 163 lb (73.9 kg)  02/12/16 160 lb 6.4 oz (72.8 kg)    General: Patient appears comfortable at rest. HEENT: Conjunctiva and lids normal, oropharynx clear. Neck: Supple, no elevated JVP or carotid bruits, no thyromegaly. Lungs: Clear to auscultation, nonlabored breathing at rest. Cardiac: Regular rate and rhythm, no S3, 2/6 systolic murmur, no pericardial rub. Abdomen: Soft, nontender, bowel sounds present. Extremities: No pitting edema, distal pulses 2+. Skin: Warm and dry. Musculoskeletal: No kyphosis. Neuropsychiatric: Alert and oriented x3, affect grossly appropriate.  ECG: I personally reviewed the tracing from 07/17/2016 which showed normal sinus rhythm.  Other  Studies Reviewed Today:  Echocardiogram 01/02/2016: Study Conclusions  - Left ventricle: The cavity size was normal. Wall thickness was   increased in a pattern of mild LVH. Systolic function was normal.   The estimated ejection fraction was in the range of 60% to 65%.   Wall motion was normal; there were no regional wall motion   abnormalities. Doppler parameters are consistent with abnormal   left ventricular relaxation (grade 1 diastolic dysfunction). - Aortic valve: Mildly calcified annulus. Trileaflet; mildly   calcified leaflets. There was mild regurgitation. Mean gradient   (S): 7 mm Hg. Peak gradient (S): 16 mm Hg. Valve area (VTI): 2.21   cm^2. - Mitral valve: Calcified annulus. There was trivial regurgitation. - Left atrium: The atrium was mildly dilated. - Right atrium: Central venous  pressure (est): 3 mm Hg. - Tricuspid valve: There was trivial regurgitation. - Pulmonary arteries: PA peak pressure: 19 mm Hg (S). - Pericardium, extracardiac: There was no pericardial effusion.  Impressions:  - Mild LVH with LVEF 60-65%. Grade 1 diastolic dysfunction. Mild   left atrial enlargement. MAC with trivial mitral regurgitation.   Mildly calcified aortic valve with mild aortic regurgitation.   Trivial tricuspid regurgitation with normal estimated PASP.  Lexiscan Myoview 02/06/2014: IMPRESSION: 1. Study consistent with mid to apical anteroseptal ischemia as detailed above.  2. Normal left ventricular wall motion.  3. Left ventricular ejection fraction 72%  4. Intermediate-risk stress test findings*.  Assessment and Plan:  1. Ischemic heart disease based on previous Myoview imaging and 2015. She is clinically stable without angina on medical therapy and prefers observation without further invasive testing. No changes made to current regimen.  2. Hyperlipidemia on Pravachol. She follows with Dr. Luan Pulling.  3. Essential hypertension, blood pressure control is adequate  today. Continue with present regimen.  4. Aortic valve sclerosis with mild aortic regurgitation, asymptomatic. Corresponds with cardiac murmur which has not changed.  Current medicines were reviewed with the patient today.  Disposition: Follow-up in 6 months.  Signed, Satira Sark, MD, [facility] 01/21/2017 9:28 AM    [place] [facility] [place] [facility]. [address], [place], [place] [address] Phone: [phone]; Fax: [phone]

## 2017-01-21 ENCOUNTER — Ambulatory Visit (INDEPENDENT_AMBULATORY_CARE_PROVIDER_SITE_OTHER): Payer: PPO | Admitting: Cardiology

## 2017-01-21 ENCOUNTER — Encounter: Payer: Self-pay | Admitting: *Deleted

## 2017-01-21 ENCOUNTER — Encounter: Payer: Self-pay | Admitting: Cardiology

## 2017-01-21 VITALS — BP 136/68 | HR 71 | Ht 66.0 in | Wt 156.0 lb

## 2017-01-21 DIAGNOSIS — Z23 Encounter for immunization: Secondary | ICD-10-CM | POA: Diagnosis not present

## 2017-01-21 DIAGNOSIS — I358 Other nonrheumatic aortic valve disorders: Secondary | ICD-10-CM

## 2017-01-21 DIAGNOSIS — E782 Mixed hyperlipidemia: Secondary | ICD-10-CM

## 2017-01-21 DIAGNOSIS — I10 Essential (primary) hypertension: Secondary | ICD-10-CM

## 2017-01-21 DIAGNOSIS — R9439 Abnormal result of other cardiovascular function study: Secondary | ICD-10-CM | POA: Diagnosis not present

## 2017-01-21 NOTE — Patient Instructions (Signed)

## 2017-03-10 ENCOUNTER — Ambulatory Visit (INDEPENDENT_AMBULATORY_CARE_PROVIDER_SITE_OTHER): Payer: PPO | Admitting: Internal Medicine

## 2017-03-10 DIAGNOSIS — M2550 Pain in unspecified joint: Secondary | ICD-10-CM | POA: Diagnosis not present

## 2017-03-10 DIAGNOSIS — E663 Overweight: Secondary | ICD-10-CM | POA: Diagnosis not present

## 2017-03-10 DIAGNOSIS — L4050 Arthropathic psoriasis, unspecified: Secondary | ICD-10-CM | POA: Diagnosis not present

## 2017-03-10 DIAGNOSIS — M150 Primary generalized (osteo)arthritis: Secondary | ICD-10-CM | POA: Diagnosis not present

## 2017-03-10 DIAGNOSIS — L409 Psoriasis, unspecified: Secondary | ICD-10-CM | POA: Diagnosis not present

## 2017-03-10 DIAGNOSIS — Z6827 Body mass index (BMI) 27.0-27.9, adult: Secondary | ICD-10-CM | POA: Diagnosis not present

## 2017-03-18 DIAGNOSIS — Z0000 Encounter for general adult medical examination without abnormal findings: Secondary | ICD-10-CM | POA: Diagnosis not present

## 2017-04-27 ENCOUNTER — Ambulatory Visit (INDEPENDENT_AMBULATORY_CARE_PROVIDER_SITE_OTHER): Payer: PPO | Admitting: Internal Medicine

## 2017-04-27 ENCOUNTER — Encounter (INDEPENDENT_AMBULATORY_CARE_PROVIDER_SITE_OTHER): Payer: Self-pay | Admitting: Internal Medicine

## 2017-04-27 VITALS — BP 128/80 | HR 72 | Temp 98.4°F | Resp 18 | Ht 64.0 in | Wt 165.1 lb

## 2017-04-27 DIAGNOSIS — K582 Mixed irritable bowel syndrome: Secondary | ICD-10-CM | POA: Diagnosis not present

## 2017-04-27 DIAGNOSIS — J019 Acute sinusitis, unspecified: Secondary | ICD-10-CM | POA: Diagnosis not present

## 2017-04-27 DIAGNOSIS — K2100 Gastro-esophageal reflux disease with esophagitis, without bleeding: Secondary | ICD-10-CM

## 2017-04-27 DIAGNOSIS — J301 Allergic rhinitis due to pollen: Secondary | ICD-10-CM | POA: Diagnosis not present

## 2017-04-27 DIAGNOSIS — E119 Type 2 diabetes mellitus without complications: Secondary | ICD-10-CM | POA: Diagnosis not present

## 2017-04-27 DIAGNOSIS — Z8711 Personal history of peptic ulcer disease: Secondary | ICD-10-CM | POA: Diagnosis not present

## 2017-04-27 DIAGNOSIS — I10 Essential (primary) hypertension: Secondary | ICD-10-CM | POA: Diagnosis not present

## 2017-04-27 NOTE — Patient Instructions (Signed)
Please send a copy of next CBC to our office. Can use Dulcolax suppository on as-needed basis for constipation.

## 2017-04-27 NOTE — Progress Notes (Signed)
Presenting complaint;  Follow-up for GERD and IBS.  Subjective:  Patient is 72 year old Caucasian female who is here for scheduled visit.  She was last seen in November 2017.  She is accompanied by her husband Nori Riis. She states she is doing well from GI standpoint.  She has heartburn only occasionally with certain foods.  She denies nausea vomiting or dysphagia.  She does experience epigastric pain if she misses PPI dose for 1 day.  She has 1-2 formed stools on most days.  She has occasional diarrhea and/or constipation.  He uses Imodium occasionally and has used Fleet Enema once a month or less often.  She has good appetite and has gained 5 pounds since her last visit.  She says she had bilateral cataract surgery in January 2018 about 3 weeks apart. She has seen Dr. Gavin Pound for arthritis and skin rash and has been diagnosed with psoriasis.  She has recommended Stelara.  Patient is not sure if she would be able to afford this medication. Patient states her last H&H was normal.   Current Medications: Outpatient Encounter Medications as of 04/27/2017  Medication Sig  . acetaminophen (TYLENOL) 500 MG tablet Take 500 mg by mouth as needed.  . diltiazem (CARDIZEM) 120 MG tablet Take 120 mg by mouth 2 (two) times daily.   . empagliflozin (JARDIANCE) 10 MG TABS tablet Take 10 mg by mouth daily.  Marland Kitchen escitalopram (LEXAPRO) 10 MG tablet Take 5 mg by mouth daily. Patient takes as needed.  . gabapentin (NEURONTIN) 600 MG tablet Take 800 mg by mouth 2 (two) times daily.   . hydroxychloroquine (PLAQUENIL) 200 MG tablet Take 200 mg by mouth 2 (two) times daily.   . insulin detemir (LEVEMIR) 100 UNIT/ML injection Inject 0.25 mLs (25 Units total) into the skin at bedtime.  . isosorbide mononitrate (IMDUR) 30 MG 24 hr tablet TAKE 1 TABLET BY MOUTH ONCE DAILY.  Marland Kitchen levothyroxine (SYNTHROID, LEVOTHROID) 50 MCG tablet Take 50 mcg by mouth daily.    Marland Kitchen loperamide (IMODIUM) 2 MG capsule Take 2 mg by mouth as  needed.  . metoprolol (LOPRESSOR) 50 MG tablet Take 50 mg by mouth daily.   Marland Kitchen OVER THE COUNTER MEDICATION OTC Antacid Magnesia/ Simethicone Oral Suspension The patient states that she takes 1 Tablespoon as needed  . pantoprazole (PROTONIX) 40 MG tablet Take 40 mg by mouth daily before breakfast.  . pravastatin (PRAVACHOL) 20 MG tablet Take 20 mg by mouth daily.   Marland Kitchen Propylene Glycol (SYSTANE BALANCE OP) Apply 2 drops to eye daily as needed. FOR DRY EYES    . pseudoephedrine-guaifenesin (MUCINEX D) 60-600 MG per tablet Take 1 tablet by mouth as needed.  . temazepam (RESTORIL) 15 MG capsule Take 15 mg by mouth at bedtime as needed for sleep.  . valsartan (DIOVAN) 160 MG tablet Take 160 mg by mouth daily.   . nitroGLYCERIN (NITROSTAT) 0.4 MG SL tablet Place 1 tablet (0.4 mg total) under the tongue every 5 (five) minutes as needed for chest pain.  Marland Kitchen ondansetron (ZOFRAN) 4 MG tablet Take 4 mg by mouth every 8 (eight) hours as needed for nausea or vomiting.  . [DISCONTINUED] LORazepam (ATIVAN) 1 MG tablet Take 1 mg by mouth as needed. For sleep  . [DISCONTINUED] sulfamethoxazole-trimethoprim (BACTRIM DS,SEPTRA DS) 800-160 MG tablet   . [DISCONTINUED] vitamin C (ASCORBIC ACID) 500 MG tablet Take 500 mg by mouth daily.   No facility-administered encounter medications on file as of 04/27/2017.      Objective: Blood  pressure 128/80, pulse 72, temperature 98.4 F (36.9 C), temperature source Oral, resp. rate 18, height 5\' 4"  (1.626 m), weight 165 lb 1.6 oz (74.9 kg). Patient is alert and in no acute distress. Conjunctiva is pink. Sclera is nonicteric Oropharyngeal mucosa is normal.  She has She has upper dental plate and edentulous and lower jaw. No neck masses or thyromegaly noted. Cardiac exam with regular rhythm normal S1 and S2. No murmur or gallop noted. Lungs are clear to auscultation. Abdomen is symmetrical soft and nontender without organomegaly or masses. No LE edema or clubbing noted.   She has skin rash involving Left forearm.    Assessment:  #1.  Chronic GERD.  She has history of esophagitis.  She is doing well with therapy.  #2.  History of peptic ulcer disease.  She has had a rather complicated course with peptic ulcer disease with GI bleed and partial gastric outlet obstruction.  Therefore she needs to be on PPI both for GERD and GI prophylaxis as the benefit outweighs the risk.  #3.  Irritable bowel syndrome.  She used to have frequent loose stools but now she has sporadic diarrhea and/or constipation.  She is doing well with sporadic Imodium and/or fleets enemas.   Plan:  Continue pantoprazole at current dose of 40 mg by mouth every morning. Patient can use Dulcolax suppository on as-needed basis. Office visit in 1 year.

## 2017-05-06 DIAGNOSIS — L03031 Cellulitis of right toe: Secondary | ICD-10-CM | POA: Diagnosis not present

## 2017-05-06 DIAGNOSIS — M79671 Pain in right foot: Secondary | ICD-10-CM | POA: Diagnosis not present

## 2017-05-06 DIAGNOSIS — M79674 Pain in right toe(s): Secondary | ICD-10-CM | POA: Diagnosis not present

## 2017-05-06 DIAGNOSIS — L600 Ingrowing nail: Secondary | ICD-10-CM | POA: Diagnosis not present

## 2017-05-12 ENCOUNTER — Encounter: Payer: Self-pay | Admitting: Internal Medicine

## 2017-05-12 DIAGNOSIS — E663 Overweight: Secondary | ICD-10-CM | POA: Diagnosis not present

## 2017-05-12 DIAGNOSIS — M150 Primary generalized (osteo)arthritis: Secondary | ICD-10-CM | POA: Diagnosis not present

## 2017-05-12 DIAGNOSIS — Z6828 Body mass index (BMI) 28.0-28.9, adult: Secondary | ICD-10-CM | POA: Diagnosis not present

## 2017-05-12 DIAGNOSIS — M064 Inflammatory polyarthropathy: Secondary | ICD-10-CM | POA: Diagnosis not present

## 2017-05-12 DIAGNOSIS — R21 Rash and other nonspecific skin eruption: Secondary | ICD-10-CM | POA: Diagnosis not present

## 2017-05-12 DIAGNOSIS — M2550 Pain in unspecified joint: Secondary | ICD-10-CM | POA: Diagnosis not present

## 2017-05-20 DIAGNOSIS — L600 Ingrowing nail: Secondary | ICD-10-CM | POA: Diagnosis not present

## 2017-05-20 DIAGNOSIS — M79674 Pain in right toe(s): Secondary | ICD-10-CM | POA: Diagnosis not present

## 2017-06-16 ENCOUNTER — Other Ambulatory Visit: Payer: Self-pay | Admitting: Obstetrics and Gynecology

## 2017-06-16 DIAGNOSIS — E1121 Type 2 diabetes mellitus with diabetic nephropathy: Secondary | ICD-10-CM | POA: Diagnosis not present

## 2017-06-16 DIAGNOSIS — S90821A Blister (nonthermal), right foot, initial encounter: Secondary | ICD-10-CM | POA: Diagnosis not present

## 2017-06-16 DIAGNOSIS — Z1231 Encounter for screening mammogram for malignant neoplasm of breast: Secondary | ICD-10-CM

## 2017-06-16 DIAGNOSIS — M8588 Other specified disorders of bone density and structure, other site: Secondary | ICD-10-CM

## 2017-06-16 DIAGNOSIS — I10 Essential (primary) hypertension: Secondary | ICD-10-CM | POA: Diagnosis not present

## 2017-06-16 DIAGNOSIS — M810 Age-related osteoporosis without current pathological fracture: Secondary | ICD-10-CM

## 2017-06-16 DIAGNOSIS — M064 Inflammatory polyarthropathy: Secondary | ICD-10-CM | POA: Diagnosis not present

## 2017-06-18 DIAGNOSIS — S90423A Blister (nonthermal), unspecified great toe, initial encounter: Secondary | ICD-10-CM | POA: Diagnosis not present

## 2017-06-18 DIAGNOSIS — M79671 Pain in right foot: Secondary | ICD-10-CM | POA: Diagnosis not present

## 2017-06-19 DIAGNOSIS — L4050 Arthropathic psoriasis, unspecified: Secondary | ICD-10-CM | POA: Diagnosis not present

## 2017-07-07 DIAGNOSIS — E663 Overweight: Secondary | ICD-10-CM | POA: Diagnosis not present

## 2017-07-07 DIAGNOSIS — M3500 Sicca syndrome, unspecified: Secondary | ICD-10-CM | POA: Diagnosis not present

## 2017-07-07 DIAGNOSIS — R21 Rash and other nonspecific skin eruption: Secondary | ICD-10-CM | POA: Diagnosis not present

## 2017-07-07 DIAGNOSIS — Z6828 Body mass index (BMI) 28.0-28.9, adult: Secondary | ICD-10-CM | POA: Diagnosis not present

## 2017-07-07 DIAGNOSIS — M2550 Pain in unspecified joint: Secondary | ICD-10-CM | POA: Diagnosis not present

## 2017-07-07 DIAGNOSIS — Z79899 Other long term (current) drug therapy: Secondary | ICD-10-CM | POA: Diagnosis not present

## 2017-07-07 DIAGNOSIS — M064 Inflammatory polyarthropathy: Secondary | ICD-10-CM | POA: Diagnosis not present

## 2017-07-07 DIAGNOSIS — M150 Primary generalized (osteo)arthritis: Secondary | ICD-10-CM | POA: Diagnosis not present

## 2017-07-09 ENCOUNTER — Ambulatory Visit
Admission: RE | Admit: 2017-07-09 | Discharge: 2017-07-09 | Disposition: A | Discharge status: Home / Self Care | Payer: PPO | Source: Ambulatory Visit | Attending: Obstetrics and Gynecology | Admitting: Obstetrics and Gynecology

## 2017-07-09 DIAGNOSIS — M810 Age-related osteoporosis without current pathological fracture: Secondary | ICD-10-CM

## 2017-07-09 DIAGNOSIS — Z780 Asymptomatic menopausal state: Secondary | ICD-10-CM | POA: Diagnosis not present

## 2017-07-09 DIAGNOSIS — Z1231 Encounter for screening mammogram for malignant neoplasm of breast: Secondary | ICD-10-CM

## 2017-07-09 DIAGNOSIS — M8588 Other specified disorders of bone density and structure, other site: Secondary | ICD-10-CM | POA: Diagnosis not present

## 2017-07-28 NOTE — Progress Notes (Signed)
Cardiology Office Note  Date: 07/30/2017   ID: [name], DOB July 26, 1945, MRN [id number]  PCP: Sinda Du, MD  Primary Cardiologist: Rozann Lesches, MD   Chief Complaint  Patient presents with  . Cardiac follow-up    History of Present Illness: Rebecca Hartman is a 72 y.o. female last seen in October 2018.  She presents for a routine visit.  She does not report any significant chest pain with exertion, stable NYHA class II dyspnea.  No palpitations or syncope.  I reviewed her medications.  Current regimen includes Diovan, Pravachol, Lopressor, Imdur, and Cardizem CD.  She does have as needed nitroglycerin available but has not had to use this.  I personally reviewed her ECG today which shows normal sinus rhythm.  Past Medical History:  Diagnosis Date  . Abnormal myocardial perfusion study    November 2015, mid to apical anteroseptal ischemia   . Anemia   . Anxiety   . Chronic back pain   . Essential hypertension   . Fibromyalgia   . Hiatal hernia   . Hypercholesteremia   . Hypothyroidism   . Type 2 diabetes mellitus ([place])     Past Surgical History:  Procedure Laterality Date  . ABDOMINAL HYSTERECTOMY    . BACK SURGERY    . CARPAL TUNNEL RELEASE    . CHOLECYSTECTOMY    . COLONOSCOPY  08/06/06 [facility]  . DENTAL SURGERY  04/2015   reconstructive surgery lower  . ESOPHAGOGASTRODUODENOSCOPY  10/31/2010   Procedure: ESOPHAGOGASTRODUODENOSCOPY (EGD);  Surgeon: [name], MD;  [facility];  Service: Endoscopy;  Laterality: N/A;  . ESOPHAGOGASTRODUODENOSCOPY  03/05/2011   Procedure: ESOPHAGOGASTRODUODENOSCOPY (EGD);  Surgeon: [name], MD;  [facility];  Service: Endoscopy;  Laterality: N/A;  12:00  . ESOPHAGOGASTRODUODENOSCOPY  04/28/2012   Procedure: ESOPHAGOGASTRODUODENOSCOPY (EGD);  Surgeon: [name], MD;  [facility];  Service: Endoscopy;  Laterality: N/A;  140-moved to 10:30 Ann notified pt  .  TONSILLECTOMY    . TOTAL HIP REVISION    . UPPER GASTROINTESTINAL ENDOSCOPY  10/03/2010   EGD DILATN PYLORIC CHANNEL  . UPPER GASTROINTESTINAL ENDOSCOPY  07/08/2010  . UPPER GASTROINTESTINAL ENDOSCOPY  08/06/06   FIELDS  . UPPER GASTROINTESTINAL ENDOSCOPY  06/29/2008   EGD    Current Outpatient Medications  Medication Sig Dispense Refill  . acetaminophen (TYLENOL) 500 MG tablet Take 500 mg by mouth as needed.    . diltiazem (CARDIZEM) 120 MG tablet Take 120 mg by mouth 2 (two) times daily.     . empagliflozin (JARDIANCE) 10 MG TABS tablet Take 10 mg by mouth daily.    Marland Kitchen escitalopram (LEXAPRO) 10 MG tablet Take 5 mg by mouth daily. Patient takes as needed.    . gabapentin (NEURONTIN) 600 MG tablet Take 800 mg by mouth 2 (two) times daily.     . hydroxychloroquine (PLAQUENIL) 200 MG tablet Take 200 mg by mouth 2 (two) times daily.     . insulin detemir (LEVEMIR) 100 UNIT/ML injection Inject 0.25 mLs (25 Units total) into the skin at bedtime. 15 mL 2  . isosorbide mononitrate (IMDUR) 30 MG 24 hr tablet TAKE 1 TABLET BY MOUTH ONCE DAILY. 90 tablet 3  . levothyroxine (SYNTHROID, LEVOTHROID) 50 MCG tablet Take 50 mcg by mouth daily.      Marland Kitchen loperamide (IMODIUM) 2 MG capsule Take 2 mg by mouth as needed.    . methotrexate 2.5 MG tablet Take 2.5 mg by mouth  once a week. 4 tablets on Wednesdays    . metoprolol (LOPRESSOR) 50 MG tablet Take 50 mg by mouth daily.     . nitroGLYCERIN (NITROSTAT) 0.4 MG SL tablet Place 1 tablet (0.4 mg total) under the tongue every 5 (five) minutes as needed for chest pain. 25 tablet 3  . ondansetron (ZOFRAN) 4 MG tablet Take 4 mg by mouth every 8 (eight) hours as needed for nausea or vomiting.    Marland Kitchen OVER THE COUNTER MEDICATION OTC Antacid Magnesia/ Simethicone Oral Suspension The patient states that she takes 1 Tablespoon as needed    . pantoprazole (PROTONIX) 40 MG tablet Take 40 mg by mouth daily before breakfast.    . pravastatin (PRAVACHOL) 20 MG tablet Take 20 mg  by mouth daily.     Marland Kitchen Propylene Glycol (SYSTANE BALANCE OP) Apply 2 drops to eye daily as needed. FOR DRY EYES      . pseudoephedrine-guaifenesin (MUCINEX D) 60-600 MG per tablet Take 1 tablet by mouth as needed.    . temazepam (RESTORIL) 15 MG capsule Take 15 mg by mouth at bedtime as needed for sleep.    . valsartan (DIOVAN) 160 MG tablet Take 160 mg by mouth daily.      No current facility-administered medications for this visit.    Allergies:  Shellfish allergy; Tussigon [hydrocodone-homatropine]; Hydrocodone; Glyburide; Levofloxacin; Lisinopril; Morphine; and Pantoprazole sodium [pantoprazole sodium]   Social History: The patient  reports that she has never smoked. She has never used smokeless tobacco. She reports that she does not drink alcohol or use drugs.   ROS:  Please see the history of present illness. Otherwise, complete review of systems is positive for intermittent hand pain and swelling due to rheumatoid arthritis.  All other systems are reviewed and negative.   Physical Exam: VS:  BP 128/74 (BP Location: Right Arm)   Pulse 75   Ht _0  (1.651 m)   Wt 163 lb (73.9 kg)   SpO2 96%   BMI 27.12 kg/m , BMI Body mass index is 27.12 kg/m.  Wt Readings from Last 3 Encounters:  07/30/17 163 lb (73.9 kg)  04/27/17 165 lb 1.6 oz (74.9 kg)  01/21/17 156 lb (70.8 kg)    General: Patient appears comfortable at rest. HEENT: Conjunctiva and lids normal, oropharynx clear. Neck: Supple, no elevated JVP or carotid bruits, no thyromegaly. Lungs: Clear to auscultation, nonlabored breathing at rest. Cardiac: Regular rate and rhythm, no S3, 2/6 systolic murmur, no pericardial rub. Abdomen: Soft, nontender, bowel sounds present. Extremities: No pitting edema, distal pulses 2+. Skin: Warm and dry. Musculoskeletal: No kyphosis. Neuropsychiatric: Alert and oriented x3, affect grossly appropriate.  ECG: I personally reviewed the tracing from 07/17/2016 which showed normal sinus  rhythm.  Recent Labwork:  October 2018: Hemoglobin A1c 5.06 May 2017: Hemoglobin 12.2, platelets 202  Other Studies Reviewed Today:  Echocardiogram 01/02/2016: Study Conclusions  - Left ventricle: The cavity size was normal. Wall thickness was increased in a pattern of mild LVH. Systolic function was normal. The estimated ejection fraction was in the range of 60% to 65%. Wall motion was normal; there were no regional wall motion abnormalities. Doppler parameters are consistent with abnormal left ventricular relaxation (grade 1 diastolic dysfunction). - Aortic valve: Mildly calcified annulus. Trileaflet; mildly calcified leaflets. There was mild regurgitation. Mean gradient (S): 7 mm Hg. Peak gradient (S): 16 mm Hg. Valve area (VTI): 2.21 cm^2. - Mitral valve: Calcified annulus. There was trivial regurgitation. - Left atrium: The atrium was  mildly dilated. - Right atrium: Central venous pressure (est): 3 mm Hg. - Tricuspid valve: There was trivial regurgitation. - Pulmonary arteries: PA peak pressure: 19 mm Hg (S). - Pericardium, extracardiac: There was no pericardial effusion.  Impressions:  - Mild LVH with LVEF 60-65%. Grade 1 diastolic dysfunction. Mild left atrial enlargement. MAC with trivial mitral regurgitation. Mildly calcified aortic valve with mild aortic regurgitation. Trivial tricuspid regurgitation with normal estimated PASP.  Lexiscan Myoview 02/06/2014: IMPRESSION: 1. Study consistent with mid to apical anteroseptal ischemia as detailed above.  2. Normal left ventricular wall motion.  3. Left ventricular ejection fraction 72%  4. Intermediate-risk stress test findings*.  Assessment and Plan:  1.  History of abnormal Myoview showing mid to apical anteroseptal ischemia.  He has preferred medical therapy and observation to further invasive testing, and is doing well without any active chest pain.  ECG is normal.  2.  Mixed  hyperlipidemia, on Pravachol.  She continues to follow with Dr. Luan Pulling.  3.  Essential hypertension, blood pressure control is adequate today.  No changes made to present regimen.  4.  Aortic valve sclerosis without stenosis, mild aortic regurgitation.  Cardiac murmur is unchanged.  Current medicines were reviewed with the patient today.   Orders Placed This Encounter  Procedures  . EKG 12-Lead    Disposition: Follow-up in 1 year.  Signed, Satira Sark, MD, [facility] 07/30/2017 10:13 AM    [place] [facility] [place]. [address], [place], [place] [address] Phone: [phone]; Fax: [phone]

## 2017-07-30 ENCOUNTER — Encounter: Payer: Self-pay | Admitting: Cardiology

## 2017-07-30 ENCOUNTER — Ambulatory Visit: Payer: PPO | Admitting: Cardiology

## 2017-07-30 VITALS — BP 128/74 | HR 75 | Ht 65.0 in | Wt 163.0 lb

## 2017-07-30 DIAGNOSIS — I358 Other nonrheumatic aortic valve disorders: Secondary | ICD-10-CM | POA: Diagnosis not present

## 2017-07-30 DIAGNOSIS — I10 Essential (primary) hypertension: Secondary | ICD-10-CM | POA: Diagnosis not present

## 2017-07-30 DIAGNOSIS — E782 Mixed hyperlipidemia: Secondary | ICD-10-CM | POA: Diagnosis not present

## 2017-07-30 DIAGNOSIS — R9439 Abnormal result of other cardiovascular function study: Secondary | ICD-10-CM

## 2017-07-30 NOTE — Patient Instructions (Addendum)
Your physician wants you to follow-up in: 1 year with Dr.McDowell You will receive a reminder letter in the mail two months in advance. If you don't receive a letter, please call our office to schedule the follow-up appointment.    Your physician recommends that you continue on your current medications as directed. Please refer to the Current Medication list given to you today.    If you need a refill on your cardiac medications before your next appointment, please call your pharmacy.     No lab work or tests ordered today.      Thank you for choosing [facility] !        

## 2017-08-10 DIAGNOSIS — Z794 Long term (current) use of insulin: Secondary | ICD-10-CM | POA: Diagnosis not present

## 2017-08-10 DIAGNOSIS — H02889 Meibomian gland dysfunction of unspecified eye, unspecified eyelid: Secondary | ICD-10-CM | POA: Diagnosis not present

## 2017-08-10 DIAGNOSIS — Z7984 Long term (current) use of oral hypoglycemic drugs: Secondary | ICD-10-CM | POA: Diagnosis not present

## 2017-08-10 DIAGNOSIS — E119 Type 2 diabetes mellitus without complications: Secondary | ICD-10-CM | POA: Diagnosis not present

## 2017-09-07 DIAGNOSIS — M064 Inflammatory polyarthropathy: Secondary | ICD-10-CM | POA: Diagnosis not present

## 2017-09-07 DIAGNOSIS — E663 Overweight: Secondary | ICD-10-CM | POA: Diagnosis not present

## 2017-09-07 DIAGNOSIS — Z79899 Other long term (current) drug therapy: Secondary | ICD-10-CM | POA: Diagnosis not present

## 2017-09-07 DIAGNOSIS — M150 Primary generalized (osteo)arthritis: Secondary | ICD-10-CM | POA: Diagnosis not present

## 2017-09-07 DIAGNOSIS — M2550 Pain in unspecified joint: Secondary | ICD-10-CM | POA: Diagnosis not present

## 2017-09-07 DIAGNOSIS — Z6828 Body mass index (BMI) 28.0-28.9, adult: Secondary | ICD-10-CM | POA: Diagnosis not present

## 2017-09-07 DIAGNOSIS — R21 Rash and other nonspecific skin eruption: Secondary | ICD-10-CM | POA: Diagnosis not present

## 2017-09-17 DIAGNOSIS — J301 Allergic rhinitis due to pollen: Secondary | ICD-10-CM | POA: Diagnosis not present

## 2017-09-17 DIAGNOSIS — M064 Inflammatory polyarthropathy: Secondary | ICD-10-CM | POA: Diagnosis not present

## 2017-09-17 DIAGNOSIS — E1121 Type 2 diabetes mellitus with diabetic nephropathy: Secondary | ICD-10-CM | POA: Diagnosis not present

## 2017-09-17 DIAGNOSIS — I10 Essential (primary) hypertension: Secondary | ICD-10-CM | POA: Diagnosis not present

## 2017-09-21 DIAGNOSIS — I10 Essential (primary) hypertension: Secondary | ICD-10-CM | POA: Diagnosis not present

## 2017-09-21 DIAGNOSIS — M064 Inflammatory polyarthropathy: Secondary | ICD-10-CM | POA: Diagnosis not present

## 2017-09-21 DIAGNOSIS — J301 Allergic rhinitis due to pollen: Secondary | ICD-10-CM | POA: Diagnosis not present

## 2017-09-21 DIAGNOSIS — E1121 Type 2 diabetes mellitus with diabetic nephropathy: Secondary | ICD-10-CM | POA: Diagnosis not present

## 2017-09-21 LAB — BASIC METABOLIC PANEL
BUN: 9 (ref 4–21)
Creatinine: 0.9 (ref 0.5–1.1)
Glucose: 99

## 2017-09-21 LAB — COMPREHENSIVE METABOLIC PANEL: Calcium: 9.3 (ref 8.7–10.7)

## 2017-09-25 NOTE — Progress Notes (Addendum)
[facility] Note  09/28/2017     CHIEF COMPLAINT Patient presents for Retina Evaluation and Diabetic Eye Exam   HISTORY OF PRESENT ILLNESS: Rebecca Hartman is a 72 y.o. female who presents to the clinic today for:   HPI    Retina Evaluation    In both eyes.  This started 3 months ago.  Associated Symptoms Fatigue and Photophobia.  Negative for Distortion, Redness, Trauma, Shoulder/Hip pain, Flashes, Blind Spot, Scalp Tenderness, Fever, Weight Loss, Jaw Claudication, Glare, Pain and Floaters.  Context:  distance vision, mid-range vision and near vision.  Treatments tried include surgery.  Response to treatment was significant improvement.  I, the attending physician,  performed the HPI with the patient and updated documentation appropriately.          Diabetic Eye Exam    Vision fluctuates with blood sugars.  Associated Symptoms Negative for Distortion, Redness, Trauma, Shoulder/Hip pain, Fatigue, Weight Loss, Jaw Claudication, Glare, Pain, Floaters, Flashes, Blind Spot, Photophobia, Scalp Tenderness and Fever.  Diabetes characteristics include Type 2, on insulin and taking oral medications.  This started 10 years ago.  Blood sugar level is controlled.  Last Blood Glucose 89.  Last A1C 5.  I, the attending physician,  performed the HPI with the patient and updated documentation appropriately.          Comments    Referral of DR.E Hawkins DME. Patient states DR. Hawks wanted her to have retina evaluation due to she is on Plaquenil,she states she has had light sensitivity for appx 3-4 yrs and she has had blurred vision for appx 3 months. Patient is DM x 10 yrs, her BS fluctuates "especially in the morning, it is usually low" . BS 89 this am, A1C 5. (3 months ago), Pt had A1C done yesterday does not know results. Pt reports she had cataract sx OU 04/23/16 with significant improvement, she had eyelid sx appx 10 yrs ago. Denies gtt's/vit's       Last edited by  Bernarda Caffey, MD on 09/28/2017 10:48 PM. (History)    Pt states she was sent from Dr. Luan Pulling; Pt states she sees Dr. Trudie Reed her rheumatologist and wanted her seen; Pt states she has had "three eye bleeds" on the white part of her eye since taking plaquenil; Pt states she has been DM x 2009; Pt states she normally sees Dr. Hassell Done in [place] for specs; Pt states she had cataract sx by Dr. Alanda Slim in 2018; Pt states she has noticed changes in [place] OU this past year; Pt states CBG is well controlled, pt states last A1C was "in the 5s";   Referring physician: Sinda Du, MD [place],  [id number]  HISTORICAL INFORMATION:   Selected notes from the MEDICAL RECORD NUMBER Referred by Dr. Susann Givens (PCP) for DM exam LEE-  Ocular Hx-  PMH- HTN, hypothyroidism, DM (on levemir and jardiance), arthritis/myalgia (on methotrexate)    CURRENT MEDICATIONS: Current Outpatient Medications (Ophthalmic Drugs)  Medication Sig  . Propylene Glycol (SYSTANE BALANCE OP) Apply 2 drops to eye daily as needed. FOR DRY EYES     No current facility-administered medications for this visit.  (Ophthalmic Drugs)   Current Outpatient Medications (Other)  Medication Sig  . acetaminophen (TYLENOL) 500 MG tablet Take 500 mg by mouth as needed.  . diltiazem (CARDIZEM) 120 MG tablet Take 120 mg by mouth 2 (two) times daily.   . empagliflozin (JARDIANCE) 10 MG TABS tablet  Take 10 mg by mouth daily.  Marland Kitchen escitalopram (LEXAPRO) 10 MG tablet Take 5 mg by mouth daily. Patient takes as needed.  . gabapentin (NEURONTIN) 600 MG tablet Take 800 mg by mouth 2 (two) times daily.   . hydroxychloroquine (PLAQUENIL) 200 MG tablet Take 200 mg by mouth 2 (two) times daily.   . insulin detemir (LEVEMIR) 100 UNIT/ML injection Inject 0.25 mLs (25 Units total) into the skin at bedtime.  . isosorbide mononitrate (IMDUR) 30 MG 24 hr tablet TAKE 1 TABLET BY MOUTH ONCE DAILY.  Marland Kitchen levothyroxine (SYNTHROID, LEVOTHROID) 50  MCG tablet Take 50 mcg by mouth daily.    Marland Kitchen loperamide (IMODIUM) 2 MG capsule Take 2 mg by mouth as needed.  . methotrexate 2.5 MG tablet Take 2.5 mg by mouth once a week. 4 tablets on Wednesdays  . metoprolol (LOPRESSOR) 50 MG tablet Take 50 mg by mouth daily.   . nitroGLYCERIN (NITROSTAT) 0.4 MG SL tablet Place 1 tablet (0.4 mg total) under the tongue every 5 (five) minutes as needed for chest pain.  Marland Kitchen ondansetron (ZOFRAN) 4 MG tablet Take 4 mg by mouth every 8 (eight) hours as needed for nausea or vomiting.  Marland Kitchen OVER THE COUNTER MEDICATION OTC Antacid Magnesia/ Simethicone Oral Suspension The patient states that she takes 1 Tablespoon as needed  . pantoprazole (PROTONIX) 40 MG tablet Take 40 mg by mouth daily before breakfast.  . pravastatin (PRAVACHOL) 20 MG tablet Take 20 mg by mouth daily.   . pseudoephedrine-guaifenesin (MUCINEX D) 60-600 MG per tablet Take 1 tablet by mouth as needed.  . temazepam (RESTORIL) 15 MG capsule Take 15 mg by mouth at bedtime as needed for sleep.  . valsartan (DIOVAN) 160 MG tablet Take 160 mg by mouth daily.    No current facility-administered medications for this visit.  (Other)      REVIEW OF SYSTEMS: ROS    Positive for: Neurological, Endocrine, Eyes   Negative for: Constitutional, Gastrointestinal, Skin, Genitourinary, Musculoskeletal, HENT, Cardiovascular, Respiratory, Psychiatric, Allergic/Imm, Heme/Lymph   Last edited by Zenovia Jordan, LPN on 06/29/9620  2:97 PM. (History)       ALLERGIES Allergies  Allergen Reactions  . Shellfish Allergy Itching and Nausea And Vomiting  . Tussigon [Hydrocodone-Homatropine]     Per patient's husband , this medication caused confusion.  . Hydrocodone Itching  . Glyburide     Sudden Drop in Blood Sugars  . Levofloxacin Itching  . Lisinopril Other (See Comments)    SEVERE DROP IN PRESSURE  . Morphine Itching and Nausea Only  . Pantoprazole Sodium [Pantoprazole Sodium] Nausea And Vomiting    PATIENT IS  UNSURE OF REACTION    PAST MEDICAL HISTORY Past Medical History:  Diagnosis Date  . Abnormal myocardial perfusion study    November 2015, mid to apical anteroseptal ischemia   . Anemia   . Anxiety   . Chronic back pain   . Essential hypertension   . Fibromyalgia   . Hiatal hernia   . Hypercholesteremia   . Hypothyroidism   . Type 2 diabetes mellitus ([place])    Past Surgical History:  Procedure Laterality Date  . ABDOMINAL HYSTERECTOMY    . BACK SURGERY    . C-EYE SURGERY PROCEDURE    . CARPAL TUNNEL RELEASE    . CATARACT EXTRACTION    . CHOLECYSTECTOMY    . COLONOSCOPY  08/06/06 [facility]  . DENTAL SURGERY  04/2015   reconstructive surgery lower  . ESOPHAGOGASTRODUODENOSCOPY  10/31/2010   Procedure: ESOPHAGOGASTRODUODENOSCOPY (EGD);  Surgeon: [name], MD;  [facility];  Service: Endoscopy;  Laterality: N/A;  . ESOPHAGOGASTRODUODENOSCOPY  03/05/2011   Procedure: ESOPHAGOGASTRODUODENOSCOPY (EGD);  Surgeon: [name], MD;  Location: [facility];  Service: Endoscopy;  Laterality: N/A;  12:00  . ESOPHAGOGASTRODUODENOSCOPY  04/28/2012   Procedure: ESOPHAGOGASTRODUODENOSCOPY (EGD);  Surgeon: [name], MD;  Location: [facility];  Service: Endoscopy;  Laterality: N/A;  140-moved to 10:30 Ann notified pt  . EYE SURGERY    . TONSILLECTOMY    . TOTAL HIP REVISION    . UPPER GASTROINTESTINAL ENDOSCOPY  10/03/2010   EGD DILATN PYLORIC CHANNEL  . UPPER GASTROINTESTINAL ENDOSCOPY  07/08/2010  . UPPER GASTROINTESTINAL ENDOSCOPY  08/06/06   FIELDS  . UPPER GASTROINTESTINAL ENDOSCOPY  06/29/2008   EGD    FAMILY HISTORY Family History  Problem Relation Age of Onset  . Diabetes Mother   . Stroke Mother   . Stroke Father   . Healthy Son     SOCIAL HISTORY Social History   Tobacco Use  . Smoking status: Never Smoker  . Smokeless tobacco: Never Used  Substance Use Topics  . Alcohol use: No    Alcohol/week: 0.0 oz  . Drug use: No          OPHTHALMIC EXAM:  Base Eye Exam    Visual Acuity (Snellen - Linear)      Right Left   Dist cc 20/200 20/60 +1   Dist ph cc 20/100 20/40   Correction:  Glasses       Tonometry (Tonopen, 1:19 PM)      Right Left   Pressure 18 19       Pupils      Dark Light Shape React APD   Right 4 3 Round Brisk None   Left 4 3 Round Brisk None       Visual Fields (Counting fingers)      Left Right    Full Full       Extraocular Movement      Right Left    Full Full       Neuro/Psych    Oriented x3:  Yes   Mood/Affect:  Normal       Dilation    Both eyes:  1.0% Mydriacyl, Paremyd @ 1:17 PM        Slit Lamp and Fundus Exam    Slit Lamp Exam      Right Left   Lids/Lashes Dermatochalasis - upper lid Dermatochalasis - upper lid   Conjunctiva/Sclera White and quiet White and quiet   Cornea Debris in tear film, Arcus, Temporal Well healed cataract wounds Debris in tear film, Arcus, Temporal Well healed cataract wounds   Anterior Chamber Deep and quiet Deep and quiet   Iris Round and dilated Round and dilated   Lens PC IOL in good position, 1+ Posterior capsular opacification PC IOL in good position, trace Posterior capsular opacification   Vitreous Vitreous syneresis Vitreous syneresis, Posterior vitreous detachment       Fundus Exam      Right Left   Disc Temporal Peripapillary atrophy Pink and Sharp   C/D Ratio 0.55 0.6   Macula Blunted foveal reflex, trace Epiretinal membrane, Retinal pigment epithelial mottling, No heme or edema Blunted foveal reflex, Epiretinal membrane, mild RPE changes, No heme or edema   Vessels Mildly Tortuous, mild AV crossing changes Mildly Tortuous, mild AV crossing changes   Periphery Attached, no heme Attached, no heme  Refraction    Manifest Refraction      Sphere Cylinder Axis Dist VA   Right -1.00 +0.75 010 20/25   Left -2.00 +1.50 180 20/30-2          IMAGING AND PROCEDURES  Imaging and Procedures for @TODAY @  OCT,  Retina - OU - Both Eyes       Right Eye Quality was good. Central Foveal Thickness: 271. Progression has no prior data. Findings include normal foveal contour, epiretinal membrane, no IRF, no SRF, macular pucker.   Left Eye Quality was good. Central Foveal Thickness: 265. Progression has no prior data. Findings include normal foveal contour, no IRF, no SRF (Trace ERM).   Notes *Images captured and stored on drive  Diagnosis / Impression:  No DME, No plaquenil toxicity/ellipsoid loss, mild ERM OU  Clinical management:  See below  Abbreviations: NFP - Normal foveal profile. CME - cystoid macular edema. PED - pigment epithelial detachment. IRF - intraretinal fluid. SRF - subretinal fluid. EZ - ellipsoid zone. ERM - epiretinal membrane. ORA - outer retinal atrophy. ORT - outer retinal tubulation. SRHM - subretinal hyper-reflective material                  ASSESSMENT/PLAN:    ICD-10-CM   1. Diabetes mellitus type 2 without retinopathy ([place]) E11.9   2. Epiretinal membrane (ERM) of both eyes H35.373   3. Hypertensive retinopathy of both eyes H35.033   4. Essential hypertension I10   5. Long-term use of Plaquenil Z79.899   6. Retinal edema H35.81 OCT, Retina - OU - Both Eyes  7. Pseudophakia of both eyes Z96.1     1. Diabetes mellitus, type 2 without retinopathy - The incidence, risk factors for progression, natural history and treatment options for diabetic retinopathy  were discussed with patient.   - The need for close monitoring of blood glucose, blood pressure, and serum lipids, avoiding cigarette or any type of tobacco, and the need for long term follow up was also discussed with patient. - f/u in 1 year, sooner prn  2. Epiretinal membrane OU The natural history, anatomy, potential for loss of vision, and treatment options including vitrectomy techniques and the complications of endophthalmitis, retinal detachment, vitreous hemorrhage, cataract progression and  permanent vision loss discussed with the patient. - mild ERM OD, tr OS - no indication for surgery at this time - monitor  3,4. Hypertensive retinopathy OU - discussed importance of tight BP control - monitor  5. Long term use of Plaquenil-  - pt currently taking 400 mg daily (200mg  BID) -- has been on it for less than a year for psoriatic arthritis -- managed by Dr. Trudie Reed (Rheumatology) - no evidence of toxicity on OCT today 7.1.19 - weight 163 lb (73.9kg)  - 400mg /day / 73.9 kg = 5.41 mg per kg per day - The American Academy of Ophthalmology recommends daily dosing of <5 mg/kg/day to prevent retinal toxicity - F/U 1 month for HVF 10-2 and autofluorescence to complete screening exams  6. No retinal edema on exam or OCT  7. Pseudophakia OU  - s/p CE/IOL by Dr. Alanda Slim  - beautiful surgeries, doing well  - monitor   Ophthalmic Meds Ordered this visit:  No orders of the defined types were placed in this encounter.      Return in about 1 month (around 10/29/2017) for F/U plaq, DFE, OCT, 10-2, autoflur..  There are no Patient Instructions on file for this visit.   Explained the diagnoses, plan,  and follow up with the patient and they expressed understanding.  Patient expressed understanding of the importance of proper follow up care.   This document serves as a record of services personally performed by Gardiner Sleeper, MD, PhD. It was created on their behalf by Catha Brow, [place], a certified ophthalmic assistant. The creation of this record is the provider's dictation and/or activities during the visit.  Electronically signed by: Catha Brow, [place]  06.28.19 10:51 PM   Gardiner Sleeper, M.D., Ph.D. Diseases & Surgery of the Retina and Vitreous [facility]  I have reviewed the above documentation for accuracy and completeness, and I agree with the above. Gardiner Sleeper, M.D., Ph.D. 09/28/17 11:03 PM     Abbreviations: M myopia (nearsighted);  A astigmatism; H hyperopia (farsighted); P presbyopia; Mrx spectacle prescription;  CTL contact lenses; OD right eye; OS left eye; OU both eyes  XT exotropia; ET esotropia; PEK punctate epithelial keratitis; PEE punctate epithelial erosions; DES dry eye syndrome; MGD meibomian gland dysfunction; ATs artificial tears; PFAT's preservative free artificial tears; Paden nuclear sclerotic cataract; PSC posterior subcapsular cataract; ERM epi-retinal membrane; PVD posterior vitreous detachment; RD retinal detachment; DM diabetes mellitus; DR diabetic retinopathy; NPDR non-proliferative diabetic retinopathy; PDR proliferative diabetic retinopathy; CSME clinically significant macular edema; DME diabetic macular edema; dbh dot blot hemorrhages; CWS cotton wool spot; POAG primary open angle glaucoma; C/D cup-to-disc ratio; HVF humphrey visual field; GVF goldmann visual field; OCT optical coherence tomography; IOP intraocular pressure; BRVO Branch retinal vein occlusion; CRVO central retinal vein occlusion; CRAO central retinal artery occlusion; BRAO branch retinal artery occlusion; RT retinal tear; SB scleral buckle; PPV pars plana vitrectomy; VH Vitreous hemorrhage; PRP panretinal laser photocoagulation; IVK intravitreal kenalog; VMT vitreomacular traction; MH Macular hole;  NVD neovascularization of the disc; NVE neovascularization elsewhere; AREDS age related eye disease study; ARMD age related macular degeneration; POAG primary open angle glaucoma; EBMD epithelial/anterior basement membrane dystrophy; ACIOL anterior chamber intraocular lens; IOL intraocular lens; PCIOL posterior chamber intraocular lens; Phaco/IOL phacoemulsification with intraocular lens placement; [place] photorefractive keratectomy; LASIK laser assisted in situ keratomileusis; HTN hypertension; DM diabetes mellitus; COPD chronic obstructive pulmonary disease

## 2017-09-28 ENCOUNTER — Ambulatory Visit (INDEPENDENT_AMBULATORY_CARE_PROVIDER_SITE_OTHER): Payer: Medicare Other | Admitting: Ophthalmology

## 2017-09-28 ENCOUNTER — Encounter (INDEPENDENT_AMBULATORY_CARE_PROVIDER_SITE_OTHER): Payer: Self-pay | Admitting: Ophthalmology

## 2017-09-28 DIAGNOSIS — H35033 Hypertensive retinopathy, bilateral: Secondary | ICD-10-CM | POA: Diagnosis not present

## 2017-09-28 DIAGNOSIS — Z79899 Other long term (current) drug therapy: Secondary | ICD-10-CM | POA: Diagnosis not present

## 2017-09-28 DIAGNOSIS — I10 Essential (primary) hypertension: Secondary | ICD-10-CM

## 2017-09-28 DIAGNOSIS — Z961 Presence of intraocular lens: Secondary | ICD-10-CM

## 2017-09-28 DIAGNOSIS — E119 Type 2 diabetes mellitus without complications: Secondary | ICD-10-CM | POA: Diagnosis not present

## 2017-09-28 DIAGNOSIS — H35373 Puckering of macula, bilateral: Secondary | ICD-10-CM | POA: Diagnosis not present

## 2017-09-28 DIAGNOSIS — H3581 Retinal edema: Secondary | ICD-10-CM

## 2017-10-03 ENCOUNTER — Other Ambulatory Visit: Payer: Self-pay | Admitting: Cardiology

## 2017-10-08 DIAGNOSIS — M064 Inflammatory polyarthropathy: Secondary | ICD-10-CM | POA: Diagnosis not present

## 2017-10-29 NOTE — Progress Notes (Addendum)
[facility] Note  11/02/2017     CHIEF COMPLAINT Patient presents for Retina Follow Up   HISTORY OF PRESENT ILLNESS: Rebecca Hartman is a 72 y.o. female who presents to the clinic today for:   HPI    Retina Follow Up    Patient presents with  Other.  In both eyes.  This started 1 month ago.  Severity is mild.  Since onset it is stable.  I, the attending physician,  performed the HPI with the patient and updated documentation appropriately.          Comments    F/U DM/Plaquenil. Patient states her vision is blurry occasionally , she is on Plaquenil 200 mg Bid. Bs this 75, A1c 5.7(09/2017). Denies new visual issues.       Last edited by Bernarda Caffey, MD on 11/02/2017  2:46 PM. (History)    Pt states she was sent from Dr. Luan Pulling; Pt states she sees Dr. Trudie Reed her rheumatologist and wanted her seen; Pt states she has had "three eye bleeds" on the white part of her eye since taking plaquenil; Pt states she has been DM x 2009; Pt states she normally sees Dr. Hassell Done in [place] for specs; Pt states she had cataract sx by Dr. Alanda Slim in 2018; Pt states she has noticed changes in [place] OU this past year; Pt states CBG is well controlled, pt states last A1C was "in the 5s";   Referring physician: Sinda Du, MD [place], [place] [address]  HISTORICAL INFORMATION:   Selected notes from the MEDICAL RECORD NUMBER Referred by Dr. Susann Givens (PCP) for DM exam LEE-  Ocular Hx-  PMH- HTN, hypothyroidism, DM (on levemir and jardiance), arthritis/myalgia (on methotrexate)    CURRENT MEDICATIONS: Current Outpatient Medications (Ophthalmic Drugs)  Medication Sig  . Propylene Glycol (SYSTANE BALANCE OP) Apply 2 drops to eye daily as needed. FOR DRY EYES     No current facility-administered medications for this visit.  (Ophthalmic Drugs)   Current Outpatient Medications (Other)  Medication Sig  . acetaminophen (TYLENOL) 500 MG tablet Take  500 mg by mouth as needed.  . diltiazem (CARDIZEM) 120 MG tablet Take 120 mg by mouth 2 (two) times daily.   . empagliflozin (JARDIANCE) 10 MG TABS tablet Take 10 mg by mouth daily.  Marland Kitchen escitalopram (LEXAPRO) 10 MG tablet Take 5 mg by mouth daily. Patient takes as needed.  . gabapentin (NEURONTIN) 600 MG tablet Take 800 mg by mouth 2 (two) times daily.   . hydroxychloroquine (PLAQUENIL) 200 MG tablet Take 200 mg by mouth 2 (two) times daily.   . insulin detemir (LEVEMIR) 100 UNIT/ML injection Inject 0.25 mLs (25 Units total) into the skin at bedtime.  . isosorbide mononitrate (IMDUR) 30 MG 24 hr tablet TAKE 1 TABLET BY MOUTH ONCE DAILY.  . isosorbide mononitrate (IMDUR) 30 MG 24 hr tablet TAKE 1 TABLET BY MOUTH ONCE DAILY.  Marland Kitchen levothyroxine (SYNTHROID, LEVOTHROID) 50 MCG tablet Take 50 mcg by mouth daily.    Marland Kitchen loperamide (IMODIUM) 2 MG capsule Take 2 mg by mouth as needed.  . methotrexate 2.5 MG tablet Take 2.5 mg by mouth once a week. 6 tablets on Wednesdays  . metoprolol (LOPRESSOR) 50 MG tablet Take 50 mg by mouth daily.   . nitroGLYCERIN (NITROSTAT) 0.4 MG SL tablet Place 1 tablet (0.4 mg total) under the tongue every 5 (five) minutes as needed for chest pain.  Marland Kitchen ondansetron (ZOFRAN)  4 MG tablet Take 4 mg by mouth every 8 (eight) hours as needed for nausea or vomiting.  Marland Kitchen OVER THE COUNTER MEDICATION OTC Antacid Magnesia/ Simethicone Oral Suspension The patient states that she takes 1 Tablespoon as needed  . pantoprazole (PROTONIX) 40 MG tablet Take 40 mg by mouth daily before breakfast.  . pravastatin (PRAVACHOL) 20 MG tablet Take 20 mg by mouth daily.   . pseudoephedrine-guaifenesin (MUCINEX D) 60-600 MG per tablet Take 1 tablet by mouth as needed.  . temazepam (RESTORIL) 15 MG capsule Take 15 mg by mouth at bedtime as needed for sleep.  . valsartan (DIOVAN) 160 MG tablet Take 160 mg by mouth daily.    No current facility-administered medications for this visit.  (Other)      REVIEW OF  SYSTEMS: ROS    Positive for: Endocrine, Eyes   Negative for: Constitutional, Gastrointestinal, Neurological, Skin, Genitourinary, Musculoskeletal, HENT, Cardiovascular, Respiratory, Psychiatric, Allergic/Imm, Heme/Lymph   Last edited by Zenovia Jordan, LPN on 12/06/3380  5:05 PM. (History)       ALLERGIES Allergies  Allergen Reactions  . Shellfish Allergy Itching and Nausea And Vomiting  . Tussigon [Hydrocodone-Homatropine]     Per patient's husband , this medication caused confusion.  . Hydrocodone Itching  . Glyburide     Sudden Drop in Blood Sugars  . Levofloxacin Itching  . Lisinopril Other (See Comments)    SEVERE DROP IN PRESSURE  . Morphine Itching and Nausea Only  . Pantoprazole Sodium [Pantoprazole Sodium] Nausea And Vomiting    PATIENT IS UNSURE OF REACTION    PAST MEDICAL HISTORY Past Medical History:  Diagnosis Date  . Abnormal myocardial perfusion study    November 2015, mid to apical anteroseptal ischemia   . Anemia   . Anxiety   . Chronic back pain   . Essential hypertension   . Fibromyalgia   . Hiatal hernia   . Hypercholesteremia   . Hypothyroidism   . Type 2 diabetes mellitus ([place])    Past Surgical History:  Procedure Laterality Date  . ABDOMINAL HYSTERECTOMY    . BACK SURGERY    . C-EYE SURGERY PROCEDURE    . CARPAL TUNNEL RELEASE    . CATARACT EXTRACTION    . CHOLECYSTECTOMY    . COLONOSCOPY  08/06/06 FIELDS  . DENTAL SURGERY  04/2015   reconstructive surgery lower  . ESOPHAGOGASTRODUODENOSCOPY  10/31/2010   Procedure: ESOPHAGOGASTRODUODENOSCOPY (EGD);  Surgeon: [name], MD;  [facility];  Service: Endoscopy;  Laterality: N/A;  . ESOPHAGOGASTRODUODENOSCOPY  03/05/2011   Procedure: ESOPHAGOGASTRODUODENOSCOPY (EGD);  Surgeon: [name], MD;  [facility];  Service: Endoscopy;  Laterality: N/A;  12:00  . ESOPHAGOGASTRODUODENOSCOPY  04/28/2012   Procedure: ESOPHAGOGASTRODUODENOSCOPY (EGD);  Surgeon: [name], MD;  [facility]: [facility];  Service: Endoscopy;  Laterality: N/A;  140-moved to 10:30 Ann notified pt  . EYE SURGERY    . TONSILLECTOMY    . TOTAL HIP REVISION    . UPPER GASTROINTESTINAL ENDOSCOPY  10/03/2010   EGD DILATN PYLORIC CHANNEL  . UPPER GASTROINTESTINAL ENDOSCOPY  07/08/2010  . UPPER GASTROINTESTINAL ENDOSCOPY  08/06/06   FIELDS  . UPPER GASTROINTESTINAL ENDOSCOPY  06/29/2008   EGD    FAMILY HISTORY Family History  Problem Relation Age of Onset  . Diabetes Mother   . Stroke Mother   . Stroke Father   . Healthy Son     SOCIAL HISTORY Social History   Tobacco Use  . Smoking  status: Never Smoker  . Smokeless tobacco: Never Used  Substance Use Topics  . Alcohol use: No    Alcohol/week: 0.0 oz  . Drug use: No         OPHTHALMIC EXAM:  Base Eye Exam    Visual Acuity (Snellen - Linear)      Right Left   Dist [place] 20/80 -2 20/30 -1   Dist ph [place] 20/70 +2 NI   Correction:  Glasses       Tonometry (Tonopen, 1:15 PM)      Right Left   Pressure 07 09       Pupils      Dark Light Shape React APD   Right 4 3 Round Brisk None   Left 4 3 Round Brisk None       Visual Fields (Counting fingers)      Left Right    Full Full       Extraocular Movement      Right Left    Full, Ortho Full, Ortho       Neuro/Psych    Oriented x3:  Yes   Mood/Affect:  Normal       Dilation    Both eyes:  1.0% Mydriacyl, 2.5% Phenylephrine @ 1:15 PM        Slit Lamp and Fundus Exam    Slit Lamp Exam      Right Left   Lids/Lashes Dermatochalasis - upper lid, Meibomian gland dysfunction Dermatochalasis - upper lid, Meibomian gland dysfunction   Conjunctiva/Sclera White and quiet White and quiet   Cornea Debris in tear film, Arcus, Temporal Well healed cataract wounds, 1-2+ diffuse Punctate epithelial erosions Debris in tear film, Arcus, Temporal Well healed cataract wounds, 3+ central Punctate epithelial erosions   Anterior Chamber Deep and quiet Deep and  quiet   Iris Round and dilated Round and dilated   Lens PC IOL in good position, trace Posterior capsular opacification PC IOL in good position, trace Posterior capsular opacification   Vitreous Vitreous syneresis Vitreous syneresis, Posterior vitreous detachment       Fundus Exam      Right Left   Disc Temporal Peripapillary atrophy, Tilted disc Pink and Sharp, mild Tilted disc   C/D Ratio 0.55 0.55   Macula Blunted foveal reflex, trace Epiretinal membrane, Retinal pigment epithelial mottling, [place], No heme or edema Blunted foveal reflex, Epiretinal membrane, mild Retinal pigment epithelial mottling, No heme or edema   Vessels Mildly Tortuous, mild AV crossing changes, Vascular attenuation Mildly Tortuous, mild AV crossing changes, Vascular attenuation   Periphery Attached, no heme Attached, no heme          IMAGING AND PROCEDURES  Imaging and Procedures for @TODAY @  OCT, Retina - OU - Both Eyes       Right Eye Quality was good. Central Foveal Thickness: 272. Progression has been stable. Findings include normal foveal contour, epiretinal membrane, no IRF, no SRF, macular pucker.   Left Eye Quality was good. Central Foveal Thickness: 268. Progression has been stable. Findings include normal foveal contour, no IRF, no SRF (Trace ERM).   Notes *Images captured and stored on drive  Diagnosis / Impression:  No DME, No plaquenil toxicity/ellipsoid loss, mild ERM OU  Clinical management:  See below  Abbreviations: NFP - Normal foveal profile. CME - cystoid macular edema. PED - pigment epithelial detachment. IRF - intraretinal fluid. SRF - subretinal fluid. EZ - ellipsoid zone. ERM - epiretinal membrane. ORA - outer retinal atrophy. ORT -  outer retinal tubulation. SRHM - subretinal hyper-reflective material         Humphrey Visual Field - OU - Both Eyes       Right Eye Reliability was good. Progression has no prior data. Findings include non-specific defects, superior  paracentral defect, inferior paracentral defect.   Left Eye Reliability was good. Progression has no prior data. Findings include superior arcuate defect, inferior arcuate defect, non-specific defects.                 ASSESSMENT/PLAN:    ICD-10-CM   1. Diabetes mellitus type 2 without retinopathy ([place]) E11.9 OCT, Retina - OU - Both Eyes  2. Epiretinal membrane (ERM) of both eyes H35.373   3. Essential hypertension I10   4. Hypertensive retinopathy of both eyes H35.033   5. Long-term use of Plaquenil Z79.899 Humphrey Visual Field - OU - Both Eyes  6. Retinal edema H35.81 OCT, Retina - OU - Both Eyes  7. Pseudophakia of both eyes Z96.1   8. Dry eye syndrome of both eyes H04.123     1. Diabetes mellitus, type 2 without retinopathy - The incidence, risk factors for progression, natural history and treatment options for diabetic retinopathy  were discussed with patient.   - The need for close monitoring of blood glucose, blood pressure, and serum lipids, avoiding cigarette or any type of tobacco, and the need for long term follow up was also discussed with patient. - f/u in 1 year, sooner prn  2. Epiretinal membrane OU The natural history, anatomy, potential for loss of vision, and treatment options including vitrectomy techniques and the complications of endophthalmitis, retinal detachment, vitreous hemorrhage, cataract progression and permanent vision loss discussed with the patient. - mild ERM OD, tr OS - no indication for surgery at this time - monitor  3,4. Hypertensive retinopathy OU - discussed importance of tight BP control - monitor  5. Long term use of Plaquenil-  - pt currently taking 400 mg daily (200mg  BID) -- has been on it for less than a year for psoriatic arthritis -- managed by Dr. Trudie Reed (Rheumatology) - baseline FAF and HVF 10-2 obtained today - no evidence of toxicity on OCT, FAF or HVF 10-2 - weight 163 lb (73.9kg)  - 400mg /day / 73.9 kg = 5.41 mg per kg  per day - The American Academy of Ophthalmology recommends daily dosing of <5 mg/kg/day to prevent retinal toxicity - F/U 1 year  6. No retinal edema on exam or OCT  7. Pseudophakia OU  - s/p CE/IOL by Dr. Alanda Slim  - beautiful surgeries, doing well  - monitor  8. Dry eyes OU- recommend artificial tears and lubricating ointment as needed    Ophthalmic Meds Ordered this visit:  No orders of the defined types were placed in this encounter.      Return in about 1 year (around 11/03/2018) for DM/Plaq exam, DFE, OCT, HVF (10-2), autoflur.  There are no Patient Instructions on file for this visit.   Explained the diagnoses, plan, and follow up with the patient and they expressed understanding.  Patient expressed understanding of the importance of proper follow up care.   This document serves as a record of services personally performed by Gardiner Sleeper, MD, PhD. It was created on their behalf by Catha Brow, [place], a certified ophthalmic assistant. The creation of this record is the provider's dictation and/or activities during the visit.  Electronically signed by: Catha Brow, [place]  08.01.19 2:52 PM    Aaron Edelman  Antony Haste, M.D., Ph.D. Diseases & Surgery of the Retina and Vitreous [facility]   I have reviewed the above documentation for accuracy and completeness, and I agree with the above. Gardiner Sleeper, M.D., Ph.D. 11/02/17 2:52 PM    Abbreviations: M myopia (nearsighted); A astigmatism; H hyperopia (farsighted); P presbyopia; Mrx spectacle prescription;  CTL contact lenses; OD right eye; OS left eye; OU both eyes  XT exotropia; ET esotropia; PEK punctate epithelial keratitis; PEE punctate epithelial erosions; DES dry eye syndrome; MGD meibomian gland dysfunction; ATs artificial tears; PFAT's preservative free artificial tears; [place] nuclear sclerotic cataract; PSC posterior subcapsular cataract; ERM epi-retinal membrane; PVD posterior vitreous  detachment; RD retinal detachment; DM diabetes mellitus; DR diabetic retinopathy; NPDR non-proliferative diabetic retinopathy; PDR proliferative diabetic retinopathy; CSME clinically significant macular edema; DME diabetic macular edema; dbh dot blot hemorrhages; CWS cotton wool spot; POAG primary open angle glaucoma; C/D cup-to-disc ratio; HVF humphrey visual field; GVF goldmann visual field; OCT optical coherence tomography; IOP intraocular pressure; BRVO Branch retinal vein occlusion; CRVO central retinal vein occlusion; CRAO central retinal artery occlusion; BRAO branch retinal artery occlusion; RT retinal tear; SB scleral buckle; PPV pars plana vitrectomy; VH Vitreous hemorrhage; PRP panretinal laser photocoagulation; IVK intravitreal kenalog; VMT vitreomacular traction; MH Macular hole;  NVD neovascularization of the disc; NVE neovascularization elsewhere; AREDS age related eye disease study; ARMD age related macular degeneration; POAG primary open angle glaucoma; EBMD epithelial/anterior basement membrane dystrophy; ACIOL anterior chamber intraocular lens; IOL intraocular lens; PCIOL posterior chamber intraocular lens; Phaco/IOL phacoemulsification with intraocular lens placement; [place] photorefractive keratectomy; LASIK laser assisted in situ keratomileusis; HTN hypertension; DM diabetes mellitus; COPD chronic obstructive pulmonary disease

## 2017-11-02 ENCOUNTER — Ambulatory Visit (INDEPENDENT_AMBULATORY_CARE_PROVIDER_SITE_OTHER): Payer: PPO | Admitting: Ophthalmology

## 2017-11-02 ENCOUNTER — Encounter (INDEPENDENT_AMBULATORY_CARE_PROVIDER_SITE_OTHER): Payer: Self-pay | Admitting: Ophthalmology

## 2017-11-02 DIAGNOSIS — I10 Essential (primary) hypertension: Secondary | ICD-10-CM

## 2017-11-02 DIAGNOSIS — E119 Type 2 diabetes mellitus without complications: Secondary | ICD-10-CM | POA: Diagnosis not present

## 2017-11-02 DIAGNOSIS — H35373 Puckering of macula, bilateral: Secondary | ICD-10-CM | POA: Diagnosis not present

## 2017-11-02 DIAGNOSIS — H04123 Dry eye syndrome of bilateral lacrimal glands: Secondary | ICD-10-CM

## 2017-11-02 DIAGNOSIS — Z79899 Other long term (current) drug therapy: Secondary | ICD-10-CM | POA: Diagnosis not present

## 2017-11-02 DIAGNOSIS — Z961 Presence of intraocular lens: Secondary | ICD-10-CM | POA: Diagnosis not present

## 2017-11-02 DIAGNOSIS — H35033 Hypertensive retinopathy, bilateral: Secondary | ICD-10-CM | POA: Diagnosis not present

## 2017-11-02 DIAGNOSIS — H3581 Retinal edema: Secondary | ICD-10-CM

## 2017-11-12 DIAGNOSIS — Z79899 Other long term (current) drug therapy: Secondary | ICD-10-CM | POA: Diagnosis not present

## 2017-11-12 DIAGNOSIS — M2550 Pain in unspecified joint: Secondary | ICD-10-CM | POA: Diagnosis not present

## 2017-11-12 DIAGNOSIS — E663 Overweight: Secondary | ICD-10-CM | POA: Diagnosis not present

## 2017-11-12 DIAGNOSIS — M064 Inflammatory polyarthropathy: Secondary | ICD-10-CM | POA: Diagnosis not present

## 2017-11-12 DIAGNOSIS — R21 Rash and other nonspecific skin eruption: Secondary | ICD-10-CM | POA: Diagnosis not present

## 2017-11-12 DIAGNOSIS — Z6828 Body mass index (BMI) 28.0-28.9, adult: Secondary | ICD-10-CM | POA: Diagnosis not present

## 2017-11-12 DIAGNOSIS — M150 Primary generalized (osteo)arthritis: Secondary | ICD-10-CM | POA: Diagnosis not present

## 2017-11-12 DIAGNOSIS — M3500 Sicca syndrome, unspecified: Secondary | ICD-10-CM | POA: Diagnosis not present

## 2017-12-17 DIAGNOSIS — I10 Essential (primary) hypertension: Secondary | ICD-10-CM | POA: Diagnosis not present

## 2017-12-17 DIAGNOSIS — E1121 Type 2 diabetes mellitus with diabetic nephropathy: Secondary | ICD-10-CM | POA: Diagnosis not present

## 2017-12-17 DIAGNOSIS — Z23 Encounter for immunization: Secondary | ICD-10-CM | POA: Diagnosis not present

## 2017-12-17 DIAGNOSIS — J301 Allergic rhinitis due to pollen: Secondary | ICD-10-CM | POA: Diagnosis not present

## 2017-12-17 DIAGNOSIS — M069 Rheumatoid arthritis, unspecified: Secondary | ICD-10-CM | POA: Diagnosis not present

## 2018-01-12 DIAGNOSIS — Z79899 Other long term (current) drug therapy: Secondary | ICD-10-CM | POA: Diagnosis not present

## 2018-01-12 DIAGNOSIS — M2550 Pain in unspecified joint: Secondary | ICD-10-CM | POA: Diagnosis not present

## 2018-01-12 DIAGNOSIS — M064 Inflammatory polyarthropathy: Secondary | ICD-10-CM | POA: Diagnosis not present

## 2018-01-12 DIAGNOSIS — Z6828 Body mass index (BMI) 28.0-28.9, adult: Secondary | ICD-10-CM | POA: Diagnosis not present

## 2018-01-12 DIAGNOSIS — E663 Overweight: Secondary | ICD-10-CM | POA: Diagnosis not present

## 2018-01-12 DIAGNOSIS — R21 Rash and other nonspecific skin eruption: Secondary | ICD-10-CM | POA: Diagnosis not present

## 2018-01-12 DIAGNOSIS — M3500 Sicca syndrome, unspecified: Secondary | ICD-10-CM | POA: Diagnosis not present

## 2018-01-12 DIAGNOSIS — M150 Primary generalized (osteo)arthritis: Secondary | ICD-10-CM | POA: Diagnosis not present

## 2018-01-12 LAB — BASIC METABOLIC PANEL
BUN: 9 (ref 4–21)
Creatinine: 0.8 (ref <=1.1)
Glucose: 63
Potassium: 4.3 (ref 3.4–5.3)
Sodium: 138 (ref 137–147)

## 2018-01-12 LAB — CBC AND DIFFERENTIAL
HCT: 37 (ref 36–46)
Hemoglobin: 12.3 (ref 12.0–16.0)
Platelets: 193 (ref 150–399)
WBC: 7.4

## 2018-01-12 LAB — HEPATIC FUNCTION PANEL
ALT: 26 (ref 7–35)
AST: 26 (ref 13–35)
Alkaline Phosphatase: 67 (ref 25–125)

## 2018-03-01 DIAGNOSIS — L4050 Arthropathic psoriasis, unspecified: Secondary | ICD-10-CM | POA: Diagnosis not present

## 2018-03-18 DIAGNOSIS — Z0000 Encounter for general adult medical examination without abnormal findings: Secondary | ICD-10-CM | POA: Diagnosis not present

## 2018-04-06 ENCOUNTER — Other Ambulatory Visit (HOSPITAL_COMMUNITY): Payer: Self-pay | Admitting: Pulmonary Disease

## 2018-04-06 ENCOUNTER — Other Ambulatory Visit: Payer: Self-pay | Admitting: Pulmonary Disease

## 2018-04-06 DIAGNOSIS — R1031 Right lower quadrant pain: Secondary | ICD-10-CM

## 2018-04-16 DIAGNOSIS — E1165 Type 2 diabetes mellitus with hyperglycemia: Secondary | ICD-10-CM | POA: Diagnosis not present

## 2018-04-16 DIAGNOSIS — D649 Anemia, unspecified: Secondary | ICD-10-CM | POA: Diagnosis not present

## 2018-04-16 DIAGNOSIS — E039 Hypothyroidism, unspecified: Secondary | ICD-10-CM | POA: Diagnosis not present

## 2018-04-16 DIAGNOSIS — I10 Essential (primary) hypertension: Secondary | ICD-10-CM | POA: Diagnosis not present

## 2018-04-16 LAB — COMPREHENSIVE METABOLIC PANEL: Calcium: 9.2 (ref 8.7–10.7)

## 2018-04-16 LAB — HEMOGLOBIN A1C
Hemoglobin A1C: 5.5
Hemoglobin A1C: 5.7

## 2018-04-16 LAB — CBC AND DIFFERENTIAL
HCT: 38 (ref 36–46)
Hemoglobin: 12.9 (ref 12.0–16.0)
Platelets: 191 (ref 150–399)
WBC: 6.1

## 2018-04-16 LAB — LIPID PANEL
Cholesterol: 115 (ref 0–200)
HDL: 39 (ref 35–70)
LDL Cholesterol: 58
Triglycerides: 93 (ref 40–160)

## 2018-04-16 LAB — BASIC METABOLIC PANEL
BUN: 10 (ref 4–21)
Creatinine: 0.8 (ref <=1.1)
Glucose: 102

## 2018-04-16 LAB — HEPATIC FUNCTION PANEL
ALT: 34 (ref 7–35)
AST: 37 — AB (ref 13–35)
Alkaline Phosphatase: 65 (ref 25–125)

## 2018-04-16 LAB — TSH: TSH: 1.65 (ref <=5.90)

## 2018-04-19 DIAGNOSIS — M150 Primary generalized (osteo)arthritis: Secondary | ICD-10-CM | POA: Diagnosis not present

## 2018-04-19 DIAGNOSIS — Z79899 Other long term (current) drug therapy: Secondary | ICD-10-CM | POA: Diagnosis not present

## 2018-04-19 DIAGNOSIS — Z6828 Body mass index (BMI) 28.0-28.9, adult: Secondary | ICD-10-CM | POA: Diagnosis not present

## 2018-04-19 DIAGNOSIS — R21 Rash and other nonspecific skin eruption: Secondary | ICD-10-CM | POA: Diagnosis not present

## 2018-04-19 DIAGNOSIS — E663 Overweight: Secondary | ICD-10-CM | POA: Diagnosis not present

## 2018-04-19 DIAGNOSIS — M064 Inflammatory polyarthropathy: Secondary | ICD-10-CM | POA: Diagnosis not present

## 2018-04-19 DIAGNOSIS — M2550 Pain in unspecified joint: Secondary | ICD-10-CM | POA: Diagnosis not present

## 2018-04-20 ENCOUNTER — Ambulatory Visit (HOSPITAL_COMMUNITY): Payer: PPO

## 2018-06-17 DIAGNOSIS — J301 Allergic rhinitis due to pollen: Secondary | ICD-10-CM | POA: Diagnosis not present

## 2018-06-17 DIAGNOSIS — M069 Rheumatoid arthritis, unspecified: Secondary | ICD-10-CM | POA: Diagnosis not present

## 2018-06-17 DIAGNOSIS — E1169 Type 2 diabetes mellitus with other specified complication: Secondary | ICD-10-CM | POA: Diagnosis not present

## 2018-06-17 DIAGNOSIS — I10 Essential (primary) hypertension: Secondary | ICD-10-CM | POA: Diagnosis not present

## 2018-07-13 DIAGNOSIS — I10 Essential (primary) hypertension: Secondary | ICD-10-CM | POA: Diagnosis not present

## 2018-07-13 DIAGNOSIS — Z125 Encounter for screening for malignant neoplasm of prostate: Secondary | ICD-10-CM | POA: Diagnosis not present

## 2018-07-13 DIAGNOSIS — Z79899 Other long term (current) drug therapy: Secondary | ICD-10-CM | POA: Diagnosis not present

## 2018-07-13 DIAGNOSIS — M2550 Pain in unspecified joint: Secondary | ICD-10-CM | POA: Diagnosis not present

## 2018-07-13 DIAGNOSIS — K2100 Gastro-esophageal reflux disease with esophagitis, without bleeding: Secondary | ICD-10-CM | POA: Diagnosis not present

## 2018-07-13 DIAGNOSIS — G4733 Obstructive sleep apnea (adult) (pediatric): Secondary | ICD-10-CM | POA: Diagnosis not present

## 2018-07-21 ENCOUNTER — Telehealth: Payer: Self-pay | Admitting: Cardiology

## 2018-07-21 NOTE — Telephone Encounter (Signed)
Virtual Visit Pre-Appointment Phone Call  "(Name), I am calling you today to discuss your upcoming appointment. We are currently trying to limit exposure to the virus that causes COVID-19 by seeing patients at home rather than in the office."  1. "What is the BEST phone number to call the day of the visit?" - include this in appointment notes  2. Do you have or have access to (through a family member/friend) a smartphone with video capability that we can use for your visit?" a. If yes - list this number in appt notes as cell (if different from BEST phone #) and list the appointment type as a VIDEO visit in appointment notes b. If no - list the appointment type as a PHONE visit in appointment notes  3. Confirm consent - "In the setting of the current Covid19 crisis, you are scheduled for a (phone or video) visit with your provider on (date) at (time).  Just as we do with many in-office visits, in order for you to participate in this visit, we must obtain consent.  If you'd like, I can send this to your mychart (if signed up) or email for you to review.  Otherwise, I can obtain your verbal consent now.  All virtual visits are billed to your insurance company just like a normal visit would be.  By agreeing to a virtual visit, we'd like you to understand that the technology does not allow for your provider to perform an examination, and thus may limit your provider's ability to fully assess your condition. If your provider identifies any concerns that need to be evaluated in person, we will make arrangements to do so.  Finally, though the technology is pretty good, we cannot assure that it will always work on either your or our end, and in the setting of a video visit, we may have to convert it to a phone-only visit.  In either situation, we cannot ensure that we have a secure connection.  Are you willing to proceed?" STAFF: Did the patient verbally acknowledge consent to telehealth visit? Document  YES/NO here: Yes  4. Advise patient to be prepared - "Two hours prior to your appointment, go ahead and check your blood pressure, pulse, oxygen saturation, and your weight (if you have the equipment to check those) and write them all down. When your visit starts, your provider will ask you for this information. If you have an Apple Watch or Kardia device, please plan to have heart rate information ready on the day of your appointment. Please have a pen and paper handy nearby the day of the visit as well."  5. Give patient instructions for MyChart download to smartphone OR [email or web address] as below if video visit (depending on what platform provider is using)  6. Inform patient they will receive a phone call 15 minutes prior to their appointment time (may be from unknown caller ID) so they should be prepared to answer    TELEPHONE CALL NOTE  Rebecca Hartman has been deemed a candidate for a follow-up tele-health visit to limit community exposure during the Covid-19 pandemic. I spoke with the patient via phone to ensure availability of phone/video source, confirm preferred email & phone number, and discuss instructions and expectations.  I reminded Rebecca Hartman to be prepared with any vital sign and/or heart rhythm information that could potentially be obtained via home monitoring, at the time of her visit. I reminded Rebecca Hartman to expect a phone call prior to  her visit.  Orinda Kenner 07/21/2018 12:53 PM

## 2018-07-29 NOTE — Progress Notes (Signed)
Virtual Visit via Telephone Note   This visit type was conducted due to national recommendations for restrictions regarding the COVID-19 Pandemic (e.g. social distancing) in an effort to limit this patient's exposure and mitigate transmission in our community.  Due to her co-morbid illnesses, this patient is at least at moderate risk for complications without adequate follow up.  This format is felt to be most appropriate for this patient at this time.  The patient did not have access to video technology/had technical difficulties with video requiring transitioning to audio format only (telephone).  All issues noted in this document were discussed and addressed.  No physical exam could be performed with this format.  Please refer to the patient's chart for her  consent to telehealth for [facility].   Evaluation Performed:  Follow-up visit  Date:  07/30/2018   ID:  Rebecca Hartman, DOB [birth year], MRN [id number]  Patient Location: Home Provider Location: Office  PCP:  Sinda Du, MD  Cardiologist:  Rozann Lesches, MD  Chief Complaint:  Cardiac follow-up  History of Present Illness:    Rebecca Hartman is a 73 y.o. female last seen in May 2019.  She did not have video access and we spoke by phone today.  From a cardiac perspective, she does not report any obvious angina symptoms or increasing shortness of breath.  No palpitations or syncope.  She continues to follow with Dr. Luan Pulling.  I reviewed her medications which are listed below.  She states that she has done fairly well with her diabetes control.  Last hemoglobin A1c was 5.9%.  Vital signs look good today.  She has been social distancing, stays at home.  She wears a mask and uses gloves when she goes out.  The patient does not have symptoms concerning for COVID-19 infection (fever, chills, cough, or new shortness of breath).    Past Medical History:  Diagnosis Date  . Abnormal myocardial perfusion study    November 2015,  mid to apical anteroseptal ischemia   . Anemia   . Anxiety   . Chronic back pain   . Essential hypertension   . Fibromyalgia   . Hiatal hernia   . Hypercholesteremia   . Hypothyroidism   . Type 2 diabetes mellitus ([place])    Past Surgical History:  Procedure Laterality Date  . ABDOMINAL HYSTERECTOMY    . BACK SURGERY    . C-EYE SURGERY PROCEDURE    . CARPAL TUNNEL RELEASE    . CATARACT EXTRACTION    . CHOLECYSTECTOMY    . COLONOSCOPY  08/06/06 FIELDS  . DENTAL SURGERY  04/2015   reconstructive surgery lower  . ESOPHAGOGASTRODUODENOSCOPY  10/31/2010   Procedure: ESOPHAGOGASTRODUODENOSCOPY (EGD);  Surgeon: Rebecca Houston, MD;  [facility];  Service: Endoscopy;  Laterality: N/A;  . ESOPHAGOGASTRODUODENOSCOPY  03/05/2011   Procedure: ESOPHAGOGASTRODUODENOSCOPY (EGD);  Surgeon: Rebecca Houston, MD;  [facility];  Service: Endoscopy;  Laterality: N/A;  12:00  . ESOPHAGOGASTRODUODENOSCOPY  04/28/2012   Procedure: ESOPHAGOGASTRODUODENOSCOPY (EGD);  Surgeon: Rebecca Houston, MD;  [facility];  Service: Endoscopy;  Laterality: N/A;  140-moved to 10:30 Ann notified pt  . EYE SURGERY    . TONSILLECTOMY    . TOTAL HIP REVISION    . UPPER GASTROINTESTINAL ENDOSCOPY  10/03/2010   EGD DILATN PYLORIC CHANNEL  . UPPER GASTROINTESTINAL ENDOSCOPY  07/08/2010  . UPPER GASTROINTESTINAL ENDOSCOPY  08/06/06   FIELDS  . UPPER GASTROINTESTINAL ENDOSCOPY  06/29/2008  EGD     Current Meds  Medication Sig  . acetaminophen (TYLENOL) 500 MG tablet Take 500 mg by mouth as needed.  . diltiazem (CARDIZEM) 120 MG tablet Take 120 mg by mouth 2 (two) times daily.   . empagliflozin (JARDIANCE) 10 MG TABS tablet Take 10 mg by mouth daily.  Marland Kitchen escitalopram (LEXAPRO) 10 MG tablet Take 5 mg by mouth daily. Patient takes as needed.  . folic acid (FOLVITE) 1 MG tablet Take 1 mg by mouth daily.  Marland Kitchen gabapentin (NEURONTIN) 800 MG tablet Take 800 mg by mouth 2 (two) times daily.  .  hydroxychloroquine (PLAQUENIL) 200 MG tablet Take 200 mg by mouth 2 (two) times daily.   . insulin detemir (LEVEMIR) 100 UNIT/ML injection Inject 0.25 mLs (25 Units total) into the skin at bedtime.  . isosorbide mononitrate (IMDUR) 30 MG 24 hr tablet TAKE 1 TABLET BY MOUTH ONCE DAILY.  Marland Kitchen levothyroxine (SYNTHROID, LEVOTHROID) 50 MCG tablet Take 50 mcg by mouth daily.    Marland Kitchen loperamide (IMODIUM) 2 MG capsule Take 2 mg by mouth as needed.  . methotrexate 2.5 MG tablet Take 2.5 mg by mouth once a week. 10 tablets on Wednesdays  . metoprolol (LOPRESSOR) 50 MG tablet Take 50 mg by mouth daily.   . nitroGLYCERIN (NITROSTAT) 0.4 MG SL tablet Place 1 tablet (0.4 mg total) under the tongue every 5 (five) minutes as needed for chest pain.  Marland Kitchen ondansetron (ZOFRAN) 4 MG tablet Take 4 mg by mouth every 8 (eight) hours as needed for nausea or vomiting.  Marland Kitchen OVER THE COUNTER MEDICATION OTC Antacid Magnesia/ Simethicone Oral Suspension The patient states that she takes 1 Tablespoon as needed  . pravastatin (PRAVACHOL) 20 MG tablet Take 20 mg by mouth daily.   Marland Kitchen Propylene Glycol (SYSTANE BALANCE OP) Apply 2 drops to eye daily as needed. FOR DRY EYES    . pseudoephedrine-guaifenesin (MUCINEX D) 60-600 MG per tablet Take 1 tablet by mouth as needed.  . temazepam (RESTORIL) 15 MG capsule Take 15 mg by mouth at bedtime as needed for sleep.  . valsartan (DIOVAN) 160 MG tablet Take 160 mg by mouth daily.   Marland Kitchen VITAMIN A PO Take 1 tablet by mouth daily.  . [DISCONTINUED] nitroGLYCERIN (NITROSTAT) 0.4 MG SL tablet Place 1 tablet (0.4 mg total) under the tongue every 5 (five) minutes as needed for chest pain.     Allergies:   Shellfish allergy; Tussigon [hydrocodone-homatropine]; Hydrocodone; Glyburide; Levofloxacin; Lisinopril; Morphine; and Pantoprazole sodium [pantoprazole sodium]   Social History   Tobacco Use  . Smoking status: Never Smoker  . Smokeless tobacco: Never Used  Substance Use Topics  . Alcohol use: No     Alcohol/week: 0.0 standard drinks  . Drug use: No     Family Hx: The patient's family history includes Diabetes in her mother; Healthy in her son; Stroke in her father and mother.  ROS:   Please see the history of present illness.    All other systems reviewed and are negative.   Prior CV studies:   The following studies were reviewed today:  Echocardiogram 01/02/2016: Study Conclusions  - Left ventricle: The cavity size was normal. Wall thickness was increased in a pattern of mild LVH. Systolic function was normal. The estimated ejection fraction was in the range of 60% to 65%. Wall motion was normal; there were no regional wall motion abnormalities. Doppler parameters are consistent with abnormal left ventricular relaxation (grade 1 diastolic dysfunction). - Aortic valve: Mildly calcified annulus.  Trileaflet; mildly calcified leaflets. There was mild regurgitation. Mean gradient (S): 7 mm Hg. Peak gradient (S): 16 mm Hg. Valve area (VTI): 2.21 cm^2. - Mitral valve: Calcified annulus. There was trivial regurgitation. - Left atrium: The atrium was mildly dilated. - Right atrium: Central venous pressure (est): 3 mm Hg. - Tricuspid valve: There was trivial regurgitation. - Pulmonary arteries: PA peak pressure: 19 mm Hg (S). - Pericardium, extracardiac: There was no pericardial effusion.  Impressions:  - Mild LVH with LVEF 60-65%. Grade 1 diastolic dysfunction. Mild left atrial enlargement. MAC with trivial mitral regurgitation. Mildly calcified aortic valve with mild aortic regurgitation. Trivial tricuspid regurgitation with normal estimated PASP.  Lexiscan Myoview 02/06/2014: IMPRESSION: 1. Study consistent with mid to apical anteroseptal ischemia as detailed above.  2. Normal left ventricular wall motion.  3. Left ventricular ejection fraction 72%  4. Intermediate-risk stress test findings*.  Labs/Other Tests and Data Reviewed:    EKG:   An ECG dated 07/30/2017 was personally reviewed today and demonstrated:  Normal sinus rhythm.  October 2018: Hemoglobin A1c 5.06 May 2017: Hemoglobin 12.2, platelets 202 January 2020: Hemoglobin A1c 5.9  Wt Readings from Last 3 Encounters:  07/30/18 164 lb (74.4 kg)  07/30/17 163 lb (73.9 kg)  04/27/17 165 lb 1.6 oz (74.9 kg)     Objective:    Vital Signs:  BP 128/66   Pulse 71   Temp 98.1 F (36.7 C)   Ht _0  (1.626 m)   Wt 164 lb (74.4 kg)   SpO2 98%   BMI 28.15 kg/m    Patient was not breathless while speaking in full sentences on the phone.  Voice tone and speech pattern were normal.  No audible wheezing.  ASSESSMENT & PLAN:    1.  History of abnormal Myoview as outlined above, managing medically per patient preference and without obvious angina symptoms.  No changes are being made today.  She does have nitroglycerin available as needed.  2.  Mixed hyperlipidemia, continues on Pravachol with follow-up by Dr. Luan Pulling.  3.  Essential hypertension, blood pressure control is adequate today.  She is on Diovan, Lopressor, and Cardizem CD.  4.  Type 2 diabetes mellitus, followed by Dr. Luan Pulling.  Most recent hemoglobin A1c 5.9%.  COVID-19 Education: The signs and symptoms of COVID-19 were discussed with the patient and how to seek care for testing (follow up with PCP or arrange E-visit).  The importance of social distancing was discussed today.  Time:   Today, I have spent 8 minutes with the patient with telehealth technology discussing the above problems.     Medication Adjustments/Labs and Tests Ordered: Current medicines are reviewed at length with the patient today.  Concerns regarding medicines are outlined above.   Tests Ordered: No orders of the defined types were placed in this encounter.   Medication Changes: Meds ordered this encounter  Medications  . nitroGLYCERIN (NITROSTAT) 0.4 MG SL tablet    Sig: Place 1 tablet (0.4 mg total) under the tongue  every 5 (five) minutes as needed for chest pain.    Dispense:  25 tablet    Refill:  3    Disposition:  Follow up 6 months in [place] office with ECG.  Signed, Rozann Lesches, MD  07/30/2018 1:05 PM    [facility]

## 2018-07-30 ENCOUNTER — Telehealth (INDEPENDENT_AMBULATORY_CARE_PROVIDER_SITE_OTHER): Payer: PPO | Admitting: Cardiology

## 2018-07-30 ENCOUNTER — Encounter: Payer: Self-pay | Admitting: Cardiology

## 2018-07-30 VITALS — BP 128/66 | HR 71 | Temp 98.1°F | Ht 64.0 in | Wt 164.0 lb

## 2018-07-30 DIAGNOSIS — R9439 Abnormal result of other cardiovascular function study: Secondary | ICD-10-CM

## 2018-07-30 DIAGNOSIS — E119 Type 2 diabetes mellitus without complications: Secondary | ICD-10-CM | POA: Diagnosis not present

## 2018-07-30 DIAGNOSIS — E782 Mixed hyperlipidemia: Secondary | ICD-10-CM

## 2018-07-30 DIAGNOSIS — Z7189 Other specified counseling: Secondary | ICD-10-CM | POA: Diagnosis not present

## 2018-07-30 DIAGNOSIS — Z794 Long term (current) use of insulin: Secondary | ICD-10-CM

## 2018-07-30 DIAGNOSIS — I10 Essential (primary) hypertension: Secondary | ICD-10-CM

## 2018-07-30 MED ORDER — NITROGLYCERIN 0.4 MG SL SUBL: 0.4000 mg | SUBLINGUAL_TABLET | SUBLINGUAL | 3 refills | Status: DC | PRN

## 2018-07-30 NOTE — Patient Instructions (Addendum)

## 2018-09-15 ENCOUNTER — Telehealth: Payer: Self-pay | Admitting: Pulmonary Disease

## 2018-09-15 ENCOUNTER — Telehealth: Payer: Self-pay | Admitting: *Deleted

## 2018-09-15 DIAGNOSIS — J301 Allergic rhinitis due to pollen: Secondary | ICD-10-CM | POA: Diagnosis not present

## 2018-09-15 DIAGNOSIS — M069 Rheumatoid arthritis, unspecified: Secondary | ICD-10-CM | POA: Diagnosis not present

## 2018-09-15 DIAGNOSIS — E1121 Type 2 diabetes mellitus with diabetic nephropathy: Secondary | ICD-10-CM | POA: Diagnosis not present

## 2018-09-15 DIAGNOSIS — E039 Hypothyroidism, unspecified: Secondary | ICD-10-CM | POA: Diagnosis not present

## 2018-09-15 DIAGNOSIS — Z20822 Contact with and (suspected) exposure to covid-19: Secondary | ICD-10-CM

## 2018-09-15 NOTE — Telephone Encounter (Signed)
Dr. Luan Pulling would like to place an order for covid testing. Office closes at 3pm today. Best contact number [phone]

## 2018-09-15 NOTE — Telephone Encounter (Signed)
Returned call to Dr. Luan Pulling who would like the pt to be scheduled for COVID-19 testing on Friday if possible at the same time as her husband. Pt's husband is going to have surgery on the 23rd for bladder cancer and is going to be tested for COVID-19 so the pt would like to to be tested as well since she is taking care of him. Unknown if pt has been in contact with someone who tested positive.   Patient called and scheduled for testing on 09/17/18 at the [facility]. Pt advised to wear a mask and to remain in car at appt time. Pt verbalized understanding.

## 2018-09-15 NOTE — Telephone Encounter (Signed)
Returned call to Dr. Luan Pulling who would like the pt to be scheduled for COVID-19 testing on Friday if possible at the same time as her husband. Pt's husband is going to have surgery on the 23rd for bladder cancer and is going to be tested for COVID-19 so the pt would like to to be tested as well since she is taking care of him. Unknown if pt has been in contact with someone who tested positive.   Patient called and scheduled for testing on 09/17/18 at the [facility]. Pt advised to wear a mask and to remain in car at appt time. Pt verbalized understanding.

## 2018-09-17 ENCOUNTER — Other Ambulatory Visit: Payer: PPO

## 2018-09-17 DIAGNOSIS — R6889 Other general symptoms and signs: Secondary | ICD-10-CM | POA: Diagnosis not present

## 2018-09-17 DIAGNOSIS — Z20822 Contact with and (suspected) exposure to covid-19: Secondary | ICD-10-CM

## 2018-09-23 LAB — NOVEL CORONAVIRUS, NAA: SARS-CoV-2, NAA: NOT DETECTED

## 2018-10-18 DIAGNOSIS — R21 Rash and other nonspecific skin eruption: Secondary | ICD-10-CM | POA: Diagnosis not present

## 2018-10-18 DIAGNOSIS — Z79899 Other long term (current) drug therapy: Secondary | ICD-10-CM | POA: Diagnosis not present

## 2018-10-18 DIAGNOSIS — L4050 Arthropathic psoriasis, unspecified: Secondary | ICD-10-CM | POA: Diagnosis not present

## 2018-10-18 DIAGNOSIS — M150 Primary generalized (osteo)arthritis: Secondary | ICD-10-CM | POA: Diagnosis not present

## 2018-10-18 DIAGNOSIS — M064 Inflammatory polyarthropathy: Secondary | ICD-10-CM | POA: Diagnosis not present

## 2018-10-18 DIAGNOSIS — M2550 Pain in unspecified joint: Secondary | ICD-10-CM | POA: Diagnosis not present

## 2018-11-01 ENCOUNTER — Other Ambulatory Visit: Payer: Self-pay

## 2018-12-01 DIAGNOSIS — Z23 Encounter for immunization: Secondary | ICD-10-CM | POA: Diagnosis not present

## 2018-12-20 DIAGNOSIS — E1169 Type 2 diabetes mellitus with other specified complication: Secondary | ICD-10-CM | POA: Diagnosis not present

## 2018-12-20 DIAGNOSIS — E039 Hypothyroidism, unspecified: Secondary | ICD-10-CM | POA: Diagnosis not present

## 2018-12-20 DIAGNOSIS — J301 Allergic rhinitis due to pollen: Secondary | ICD-10-CM | POA: Diagnosis not present

## 2018-12-20 DIAGNOSIS — I10 Essential (primary) hypertension: Secondary | ICD-10-CM | POA: Diagnosis not present

## 2018-12-25 ENCOUNTER — Other Ambulatory Visit: Payer: Self-pay | Admitting: Cardiology

## 2019-01-24 DIAGNOSIS — M3500 Sicca syndrome, unspecified: Secondary | ICD-10-CM | POA: Diagnosis not present

## 2019-01-24 DIAGNOSIS — L4050 Arthropathic psoriasis, unspecified: Secondary | ICD-10-CM | POA: Diagnosis not present

## 2019-01-24 DIAGNOSIS — M150 Primary generalized (osteo)arthritis: Secondary | ICD-10-CM | POA: Diagnosis not present

## 2019-01-24 DIAGNOSIS — M2550 Pain in unspecified joint: Secondary | ICD-10-CM | POA: Diagnosis not present

## 2019-01-24 DIAGNOSIS — Z79899 Other long term (current) drug therapy: Secondary | ICD-10-CM | POA: Diagnosis not present

## 2019-01-24 DIAGNOSIS — M064 Inflammatory polyarthropathy: Secondary | ICD-10-CM | POA: Diagnosis not present

## 2019-02-21 ENCOUNTER — Other Ambulatory Visit: Payer: Self-pay

## 2019-02-28 NOTE — Progress Notes (Signed)
Virtual Visit via Telephone Note   This visit type was conducted due to national recommendations for restrictions regarding the COVID-19 Pandemic (e.g. social distancing) in an effort to limit this patient's exposure and mitigate transmission in our community.  Due to her co-morbid illnesses, this patient is at least at moderate risk for complications without adequate follow up.  This format is felt to be most appropriate for this patient at this time.  The patient did not have access to video technology/had technical difficulties with video requiring transitioning to audio format only (telephone).  All issues noted in this document were discussed and addressed.  No physical exam could be performed with this format.  Please refer to the patient's chart for her  consent to telehealth for [facility]   Date:  03/01/2019   ID:  Rebecca Hartman, DOB [birth year], MRN [id number]  Patient Location: Home Provider Location: Office  PCP:  Sinda Du, MD  Cardiologist:  Rozann Lesches, MD Electrophysiologist:  None   Evaluation Performed:  Follow-Up Visit  Chief Complaint:  Cardiac follow-up  History of Present Illness:    Rebecca Hartman is a 73 y.o. female last assessed via telehealth encounter in May.  We spoke by phone today.  She tells me that she has been doing well, no exertional chest pain or shortness of breath with typical ADLs around the house.  She has had no palpitations or syncope.  I reviewed her medications which are outlined below and stable from a cardiac perspective.  She has not used any nitroglycerin.  Most recent lab work as outlined below.  She plans to transition from Dr. Luan Pulling to Dr. Holly Bodily.  The patient does not have symptoms concerning for COVID-19 infection (fever, chills, cough, or new shortness of breath).    Past Medical History:  Diagnosis Date  . Abnormal myocardial perfusion study    November 2015, mid to apical anteroseptal ischemia   . Anemia   .  Anxiety   . Chronic back pain   . Essential hypertension   . Fibromyalgia   . Hiatal hernia   . Hypercholesteremia   . Hypothyroidism   . Type 2 diabetes mellitus ([place])    Past Surgical History:  Procedure Laterality Date  . ABDOMINAL HYSTERECTOMY    . BACK SURGERY    . C-EYE SURGERY PROCEDURE    . CARPAL TUNNEL RELEASE    . CATARACT EXTRACTION    . CHOLECYSTECTOMY    . COLONOSCOPY  08/06/06 FIELDS  . DENTAL SURGERY  04/2015   reconstructive surgery lower  . ESOPHAGOGASTRODUODENOSCOPY  10/31/2010   Procedure: ESOPHAGOGASTRODUODENOSCOPY (EGD);  Surgeon: [name], MD;  [facility];  Service: Endoscopy;  Laterality: N/A;  . ESOPHAGOGASTRODUODENOSCOPY  03/05/2011   Procedure: ESOPHAGOGASTRODUODENOSCOPY (EGD);  Surgeon: [name], MD;  [facility];  Service: Endoscopy;  Laterality: N/A;  12:00  . ESOPHAGOGASTRODUODENOSCOPY  04/28/2012   Procedure: ESOPHAGOGASTRODUODENOSCOPY (EGD);  Surgeon: [name], MD;  [facility];  Service: Endoscopy;  Laterality: N/A;  140-moved to 10:30 Ann notified pt  . EYE SURGERY    . TONSILLECTOMY    . TOTAL HIP REVISION    . UPPER GASTROINTESTINAL ENDOSCOPY  10/03/2010   EGD DILATN PYLORIC CHANNEL  . UPPER GASTROINTESTINAL ENDOSCOPY  07/08/2010  . UPPER GASTROINTESTINAL ENDOSCOPY  08/06/06   FIELDS  . UPPER GASTROINTESTINAL ENDOSCOPY  06/29/2008   EGD     Current Meds  Medication Sig  . acetaminophen (TYLENOL)  500 MG tablet Take 500 mg by mouth as needed.  . diltiazem (CARDIZEM) 120 MG tablet Take 120 mg by mouth 2 (two) times daily.   . empagliflozin (JARDIANCE) 10 MG TABS tablet Take 10 mg by mouth daily.  Marland Kitchen escitalopram (LEXAPRO) 10 MG tablet Take 10 mg by mouth daily.   Marland Kitchen etanercept (ENBREL) 50 MG/ML injection Inject 50 mg into the skin once a week.  . folic acid (FOLVITE) 1 MG tablet Take 1 mg by mouth daily.  Marland Kitchen gabapentin (NEURONTIN) 800 MG tablet Take 800 mg by mouth 2 (two) times daily.   . hydroxychloroquine (PLAQUENIL) 200 MG tablet Take 200 mg by mouth 2 (two) times daily.   . insulin detemir (LEVEMIR) 100 UNIT/ML injection Inject 0.25 mLs (25 Units total) into the skin at bedtime.  . isosorbide mononitrate (IMDUR) 30 MG 24 hr tablet TAKE 1 TABLET BY MOUTH ONCE DAILY.  Marland Kitchen levothyroxine (SYNTHROID, LEVOTHROID) 50 MCG tablet Take 50 mcg by mouth daily.    . methotrexate 2.5 MG tablet Take 2.5 mg by mouth once a week. 10 tablets on Wednesdays  . metoprolol (LOPRESSOR) 50 MG tablet Take 50 mg by mouth daily.   . nitroGLYCERIN (NITROSTAT) 0.4 MG SL tablet Place 1 tablet (0.4 mg total) under the tongue every 5 (five) minutes as needed for chest pain.  Marland Kitchen ondansetron (ZOFRAN) 4 MG tablet Take 4 mg by mouth every 8 (eight) hours as needed for nausea or vomiting.  Marland Kitchen OVER THE COUNTER MEDICATION OTC Antacid Magnesia/ Simethicone Oral Suspension The patient states that she takes 1 Tablespoon as needed  . pravastatin (PRAVACHOL) 20 MG tablet Take 20 mg by mouth daily.   Marland Kitchen Propylene Glycol (SYSTANE BALANCE OP) Apply 2 drops to eye daily as needed. FOR DRY EYES    . pseudoephedrine-guaifenesin (MUCINEX D) 60-600 MG per tablet Take 1 tablet by mouth as needed.  . temazepam (RESTORIL) 15 MG capsule Take 15 mg by mouth at bedtime as needed for sleep.  . valsartan (DIOVAN) 160 MG tablet Take 160 mg by mouth daily.   Marland Kitchen VITAMIN A PO Take 1 tablet by mouth daily.     Allergies:   Shellfish allergy, Tussigon [hydrocodone-homatropine], Hydrocodone, Glyburide, Levofloxacin, Lisinopril, Morphine, and Pantoprazole sodium [pantoprazole sodium]   Social History   Tobacco Use  . Smoking status: Never Smoker  . Smokeless tobacco: Never Used  Substance Use Topics  . Alcohol use: No    Alcohol/week: 0.0 standard drinks  . Drug use: No     Family Hx: The patient's family history includes Diabetes in her mother; Healthy in her son; Stroke in her father and mother.  ROS:   Please see the history of  present illness. Stable symptoms related to psoriatic arthritis.  She sees a rheumatologist. All other systems reviewed and are negative.   Prior CV studies:   The following studies were reviewed today:  Echocardiogram 01/02/2016: Study Conclusions  - Left ventricle: The cavity size was normal. Wall thickness was increased in a pattern of mild LVH. Systolic function was normal. The estimated ejection fraction was in the range of 60% to 65%. Wall motion was normal; there were no regional wall motion abnormalities. Doppler parameters are consistent with abnormal left ventricular relaxation (grade 1 diastolic dysfunction). - Aortic valve: Mildly calcified annulus. Trileaflet; mildly calcified leaflets. There was mild regurgitation. Mean gradient (S): 7 mm Hg. Peak gradient (S): 16 mm Hg. Valve area (VTI): 2.21 cm^2. - Mitral valve: Calcified annulus. There was trivial regurgitation. -  Left atrium: The atrium was mildly dilated. - Right atrium: Central venous pressure (est): 3 mm Hg. - Tricuspid valve: There was trivial regurgitation. - Pulmonary arteries: PA peak pressure: 19 mm Hg (S). - Pericardium, extracardiac: There was no pericardial effusion.  Impressions:  - Mild LVH with LVEF 60-65%. Grade 1 diastolic dysfunction. Mild left atrial enlargement. MAC with trivial mitral regurgitation. Mildly calcified aortic valve with mild aortic regurgitation. Trivial tricuspid regurgitation with normal estimated PASP.  Lexiscan Myoview 02/06/2014: IMPRESSION: 1. Study consistent with mid to apical anteroseptal ischemia as detailed above.  2. Normal left ventricular wall motion.  3. Left ventricular ejection fraction 72%  4. Intermediate-risk stress test findings*.  Labs/Other Tests and Data Reviewed:    EKG:  An ECG dated 07/30/2017 was personally reviewed today and demonstrated:  Normal sinus rhythm.  Recent Labs: 04/16/2018: ALT 34; BUN 10; Creatinine  0.8; Hemoglobin 12.9; Platelets 191; TSH 1.65, hemoglobin A1c 5.9%  Recent Lipid Panel Lab Results  Component Value Date/Time   CHOL 115 04/16/2018   TRIG 93 04/16/2018   HDL 39 04/16/2018   LDLCALC 58 04/16/2018    Wt Readings from Last 3 Encounters:  03/01/19 168 lb (76.2 kg)  07/30/18 164 lb (74.4 kg)  07/30/17 163 lb (73.9 kg)     Objective:    Vital Signs:  BP (!) 130/58   Ht _0  (1.626 m)   Wt 168 lb (76.2 kg)   BMI 28.84 kg/m    Patient spoke in full sentences, not short of breath. No audible wheezing or coughing. Speech pattern normal.  ASSESSMENT & PLAN:    1.  Ischemic heart disease based on previous Myoview as outlined above, stable without active angina symptoms.  Plan to continue medical therapy and observation.  Current regimen includes statin, ARB, beta-blocker, long-acting nitrate, and calcium channel blocker.  2.  Mixed hyperlipidemia, on Pravachol.  Last LDL 58.  3.  Essential hypertension, systolic in the 037V today.  No changes made to current regimen.  4.  Type 2 diabetes mellitus, continues to follow with endocrinology.  She is on Jardiance and Levemir.  Last hemoglobin A1c 5.9%.  COVID-19 Education: The signs and symptoms of COVID-19 were discussed with the patient and how to seek care for testing (follow up with PCP or arrange E-visit).  The importance of social distancing was discussed today.  Time:   Today, I have spent 8 minutes with the patient with telehealth technology discussing the above problems.     Medication Adjustments/Labs and Tests Ordered: Current medicines are reviewed at length with the patient today.  Concerns regarding medicines are outlined above.   Tests Ordered: No orders of the defined types were placed in this encounter.   Medication Changes: No orders of the defined types were placed in this encounter.   Follow Up:  In Person 6 months in the [place] office.  Signed, Rozann Lesches, MD  03/01/2019 9:32  AM    [place]

## 2019-03-01 ENCOUNTER — Telehealth (INDEPENDENT_AMBULATORY_CARE_PROVIDER_SITE_OTHER): Payer: PPO | Admitting: Cardiology

## 2019-03-01 ENCOUNTER — Encounter: Payer: Self-pay | Admitting: Cardiology

## 2019-03-01 VITALS — BP 130/58 | Ht 64.0 in | Wt 168.0 lb

## 2019-03-01 DIAGNOSIS — E782 Mixed hyperlipidemia: Secondary | ICD-10-CM

## 2019-03-01 DIAGNOSIS — E119 Type 2 diabetes mellitus without complications: Secondary | ICD-10-CM

## 2019-03-01 DIAGNOSIS — I10 Essential (primary) hypertension: Secondary | ICD-10-CM | POA: Diagnosis not present

## 2019-03-01 DIAGNOSIS — Z794 Long term (current) use of insulin: Secondary | ICD-10-CM

## 2019-03-01 DIAGNOSIS — R9439 Abnormal result of other cardiovascular function study: Secondary | ICD-10-CM

## 2019-03-01 NOTE — Patient Instructions (Signed)
Medication Instructions:  Your physician recommends that you continue on your current medications as directed. Please refer to the Current Medication list given to you today.  *If you need a refill on your cardiac medications before your next appointment, please call your pharmacy*  Lab Work: None today If you have labs (blood work) drawn today and your tests are completely normal, you will receive your results only by: . MyChart Message (if you have MyChart) OR . A paper copy in the mail If you have any lab test that is abnormal or we need to change your treatment, we will call you to review the results.  Testing/Procedures: None today  Follow-Up: At [facility], you and your health needs are our priority.  As part of our continuing mission to provide you with exceptional heart care, we have created designated Provider Care Teams.  These Care Teams include your primary Cardiologist (physician) and Advanced Practice Providers (APPs -  Physician Assistants and Nurse Practitioners) who all work together to provide you with the care you need, when you need it.  Your next appointment:   6 months  The format for your next appointment:   In Person  Provider:   Samuel McDowell, MD  Other Instructions None     Thank you for choosing [facility] !         

## 2019-03-27 ENCOUNTER — Other Ambulatory Visit: Payer: Self-pay | Admitting: Cardiology

## 2019-04-05 ENCOUNTER — Encounter: Payer: Self-pay | Admitting: Family Medicine

## 2019-04-05 ENCOUNTER — Ambulatory Visit (INDEPENDENT_AMBULATORY_CARE_PROVIDER_SITE_OTHER): Payer: PPO | Admitting: Family Medicine

## 2019-04-05 ENCOUNTER — Other Ambulatory Visit: Payer: Self-pay

## 2019-04-05 VITALS — BP 120/63 | HR 65 | Temp 97.6°F | Ht 64.0 in | Wt 171.8 lb

## 2019-04-05 DIAGNOSIS — I10 Essential (primary) hypertension: Secondary | ICD-10-CM | POA: Diagnosis not present

## 2019-04-05 DIAGNOSIS — D509 Iron deficiency anemia, unspecified: Secondary | ICD-10-CM

## 2019-04-05 DIAGNOSIS — E039 Hypothyroidism, unspecified: Secondary | ICD-10-CM | POA: Diagnosis not present

## 2019-04-05 DIAGNOSIS — L4050 Arthropathic psoriasis, unspecified: Secondary | ICD-10-CM | POA: Insufficient documentation

## 2019-04-05 DIAGNOSIS — E118 Type 2 diabetes mellitus with unspecified complications: Secondary | ICD-10-CM | POA: Insufficient documentation

## 2019-04-05 DIAGNOSIS — E785 Hyperlipidemia, unspecified: Secondary | ICD-10-CM

## 2019-04-05 DIAGNOSIS — G4700 Insomnia, unspecified: Secondary | ICD-10-CM | POA: Insufficient documentation

## 2019-04-05 MED ORDER — INSULIN DETEMIR 100 UNIT/ML ~~LOC~~ SOLN: SUBCUTANEOUS | 2 refills | Status: DC

## 2019-04-05 MED ORDER — TRAZODONE HCL 50 MG PO TABS: 50.0000 mg | ORAL_TABLET | Freq: Every day | ORAL | 1 refills | Status: DC

## 2019-04-05 NOTE — Patient Instructions (Addendum)
Lower levemir to 20units at night Check glucose in the morning and before lunch-write down readings Check glucose after evening meal-write down readings  Keep appointments with Gertie Fey, rheumatology, cardiology  Discuss change in medication for insomnia in 2 weeks  Make an appointment with podiatrist for evaluation

## 2019-04-05 NOTE — Progress Notes (Signed)
New Patient Office Visit  Subjective:  Patient ID: Rebecca Hartman, female    DOB: 04-07-1945  Age: 74 y.o. MRN: [id number]  CC:  Chief Complaint  Patient presents with  . Establish Care  . Insomnia    HPI Rebecca Hartman presents for HTN-diltiazem/valsartan/lopressor-stable-no recent medication changes-cardio following-12/20 virtual appt Hyperlipidemia-pravachol-04/16/18 DM-glucose readings at home-89 fasting in the morning, levemir-25units at night, 10 mg Jardiance -taken for 2 years-pt eats breakfast-cereal, sandwich-low readings prior to lunch 50  Echocardiogram 01/02/2016: Study Conclusions  - Left ventricle: The cavity size was normal. Wall thickness was increased in a pattern of mild LVH. Systolic function was normal. The estimated ejection fraction was in the range of 60% to 65%. Wall motion was normal; there were no regional wall motion abnormalities. Doppler parameters are consistent with abnormal left ventricular relaxation (grade 1 diastolic dysfunction). - Aortic valve: Mildly calcified annulus. Trileaflet; mildly calcified leaflets. There was mild regurgitation. Mean gradient (S): 7 mm Hg. Peak gradient (S): 16 mm Hg. Valve area (VTI): 2.21 cm^2. - Mitral valve: Calcified annulus. There was trivial regurgitation. - Left atrium: The atrium was mildly dilated. - Right atrium: Central venous pressure (est): 3 mm Hg. - Tricuspid valve: There was trivial regurgitation. - Pulmonary arteries: PA peak pressure: 19 mm Hg (S). - Pericardium, extracardiac: There was no pericardial effusion.  Impressions:  - Mild LVH with LVEF 60-65%. Grade 1 diastolic dysfunction. Mild left atrial enlargement. MAC with trivial mitral regurgitation. Mildly calcified aortic valve with mild aortic regurgitation. Trivial tricuspid regurgitation with normal estimated PASP.  Lexiscan Myoview 02/06/2014: IMPRESSION: 1. Study consistent with mid to apical anteroseptal  ischemia as detailed above.  2. Normal left ventricular wall motion.  3. Left ventricular ejection fraction 72%  4. Intermediate-risk stress test findings*.  Dr. Haynes Bast 3 months-rheumatology Long term use of Plaquenil-  - pt currently taking 400 mg daily (231m BID) -- has been on plaquenil for less than a year for psoriatic arthritis -- managed by Dr. HTrudie Reed(Rheumatology) - baseline FAF and HVF 10-2 obtained today - no evidence of toxicity on OCT, FAF or HVF 10-2 - weight 163 lb (73.9kg)  - 4087mday / 73.9 kg = 5.41 mg per kg per day - The American Academy of Ophthalmology recommends daily dosing of <5 mg/kg/day to prevent retinal toxicity - F/U 1 year   Past Medical History:  Diagnosis Date  . Abnormal myocardial perfusion study    November 2015, mid to apical anteroseptal ischemia   . Anemia   . Anxiety   . Arthritis   . Blood transfusion without reported diagnosis   . Cataract   . Chronic back pain   . Essential hypertension   . Fibromyalgia   . Hiatal hernia   . Hypercholesteremia   . Hypothyroidism   . Osteoporosis   . Type 2 diabetes mellitus ([facility]    Past Surgical History:  Procedure Laterality Date  . ABDOMINAL HYSTERECTOMY    . BACK SURGERY    . C-EYE SURGERY PROCEDURE    . CARPAL TUNNEL RELEASE    . CATARACT EXTRACTION    . CHOLECYSTECTOMY    . COLONOSCOPY  08/06/06 [facility]  . DENTAL SURGERY  04/2015   reconstructive surgery lower  . ESOPHAGOGASTRODUODENOSCOPY  10/31/2010   Procedure: ESOPHAGOGASTRODUODENOSCOPY (EGD);  Surgeon: NaRogene HoustonMD;  [facility];  Service: Endoscopy;  Laterality: N/A;  . ESOPHAGOGASTRODUODENOSCOPY  03/05/2011   Procedure: ESOPHAGOGASTRODUODENOSCOPY (EGD);  Surgeon: NaRogene HoustonMD;  [facility]  [facility];  Service: Endoscopy;  Laterality: N/A;  12:00  . ESOPHAGOGASTRODUODENOSCOPY  04/28/2012   Procedure: ESOPHAGOGASTRODUODENOSCOPY (EGD);  Surgeon: [name], MD;  Location: [facility];   Service: Endoscopy;  Laterality: N/A;  140-moved to 10:30 Ann notified pt  . EYE SURGERY    . TONSILLECTOMY    . TOTAL HIP REVISION    . UPPER GASTROINTESTINAL ENDOSCOPY  10/03/2010   EGD DILATN PYLORIC CHANNEL  . UPPER GASTROINTESTINAL ENDOSCOPY  07/08/2010  . UPPER GASTROINTESTINAL ENDOSCOPY  08/06/06   FIELDS  . UPPER GASTROINTESTINAL ENDOSCOPY  06/29/2008   EGD    Family History  Problem Relation Age of Onset  . Diabetes Mother   . Stroke Mother   . Hyperlipidemia Mother   . Hypertension Mother   . Stroke Father   . Heart disease Father   . Hyperlipidemia Father   . Hypertension Father   . Healthy Son     Social History   Socioeconomic History  . Marital status: Married    Spouse name: Not on file  . Number of children: Not on file  . Years of education: Not on file  . Highest education level: Not on file  Occupational History  . Occupation: retired  Tobacco Use  . Smoking status: Never Smoker  . Smokeless tobacco: Never Used  Substance and Sexual Activity  . Alcohol use: No    Alcohol/week: 0.0 standard drinks  . Drug use: No  . Sexual activity: Yes    Birth control/protection: Post-menopausal  Other Topics Concern  . Not on file  Social History Narrative  . Not on file   Social Determinants of Health   Financial Resource Strain:   . Difficulty of Paying Living Expenses: Not on file  Food Insecurity:   . Worried About Charity fundraiser in the Last Year: Not on file  . Ran Out of Food in the Last Year: Not on file  Transportation Needs:   . Lack of Transportation (Medical): Not on file  . Lack of Transportation (Non-Medical): Not on file  Physical Activity:   . Days of Exercise per Week: Not on file  . Minutes of Exercise per Session: Not on file  Stress:   . Feeling of Stress : Not on file  Social Connections:   . Frequency of Communication with Friends and Family: Not on file  . Frequency of Social Gatherings with Friends and Family: Not on  file  . Attends Religious Services: Not on file  . Active Member of Clubs or Organizations: Not on file  . Attends Archivist Meetings: Not on file  . Marital Status: Not on file  Intimate Partner Violence:   . Fear of Current or Ex-Partner: Not on file  . Emotionally Abused: Not on file  . Physically Abused: Not on file  . Sexually Abused: Not on file    ROS Review of Systems  Constitutional: Positive for fatigue.  HENT: Positive for dental problem, mouth sores, rhinorrhea and sinus pressure.   Eyes: Positive for photophobia, discharge and visual disturbance.  Cardiovascular: Positive for palpitations.  Endocrine: Positive for polydipsia.  Genitourinary: Positive for frequency.  Musculoskeletal: Positive for arthralgias and joint swelling.  Skin: Positive for rash.  Allergic/Immunologic: Positive for environmental allergies and food allergies.  Hematological: Bruises/bleeds easily.    Objective:   Today's Vitals: BP 120/63 (BP Location: Right Arm, Patient Position: Sitting, Cuff Size: Normal)   Pulse 65   Temp 97.6 F (36.4 C) (  Oral)   Ht _0  (1.626 m)   Wt 171 lb 12.8 oz (77.9 kg)   SpO2 97%   BMI 29.49 kg/m   Physical Exam  Assessment & Plan:  1. Hypothyroidism, unspecified type Long term use of synthroid-taken away from other medication-no recent dose change - TSH 2. Iron deficiency anemia, unspecified iron deficiency anemia type Bleeding ulcers-GI follows 3. Hyperlipidemia, unspecified hyperlipidemia type pravachol daily-no recent dose change - COMPLETE METABOLIC PANEL WITH GFR - Lipid panel 4. Hypertension, unspecified type Cardizem/lopressor/diovan-no recent dosage change-cardiology yearly - COMPLETE METABOLIC PANEL WITH GFR - Urinalysis 5/19 ecg 5. Controlled type 2 diabetes mellitus with complication, without long-term current use of insulin (HCC) Decrease levemir to 20units at night-continue checking glucose in the morning and before lunch  daily. Check glucose before bedtime Continue jardiance-recheck in 2 weeks - Hemoglobin A1c 6. Arthritis with psoriasis [facility] Rheumatology following 7. Insomnia, unspecified type D/w pt change from restoril to trazodone-rx-trial-risk/benefit/side effects-sleep referral if not able to tolerate Outpatient Encounter Medications as of 04/05/2019  Medication Sig  . acetaminophen (TYLENOL) 500 MG tablet Take 500 mg by mouth as needed.  . diltiazem (CARDIZEM) 120 MG tablet Take 120 mg by mouth 2 (two) times daily.   . empagliflozin (JARDIANCE) 10 MG TABS tablet Take 10 mg by mouth daily.  Marland Kitchen escitalopram (LEXAPRO) 10 MG tablet Take 10 mg by mouth daily.   Marland Kitchen etanercept (ENBREL) 50 MG/ML injection Inject 50 mg into the skin once a week.  . folic acid (FOLVITE) 1 MG tablet Take 1 mg by mouth daily.  Marland Kitchen gabapentin (NEURONTIN) 800 MG tablet Take 800 mg by mouth 2 (two) times daily.  . hydroxychloroquine (PLAQUENIL) 200 MG tablet Take 200 mg by mouth 2 (two) times daily.   . insulin detemir (LEVEMIR) 100 UNIT/ML injection Inject 0.25 mLs (25 Units total) into the skin at bedtime.  . isosorbide mononitrate (IMDUR) 30 MG 24 hr tablet TAKE 1 TABLET BY MOUTH ONCE DAILY.  Marland Kitchen levothyroxine (SYNTHROID, LEVOTHROID) 50 MCG tablet Take 50 mcg by mouth daily.    . methotrexate 2.5 MG tablet Take 2.5 mg by mouth once a week. 10 tablets on Wednesdays  . metoprolol (LOPRESSOR) 50 MG tablet Take 50 mg by mouth daily.   . nitroGLYCERIN (NITROSTAT) 0.4 MG SL tablet Place 1 tablet (0.4 mg total) under the tongue every 5 (five) minutes as needed for chest pain.  Marland Kitchen ondansetron (ZOFRAN) 4 MG tablet Take 4 mg by mouth every 8 (eight) hours as needed for nausea or vomiting.  Marland Kitchen OVER THE COUNTER MEDICATION OTC Antacid Magnesia/ Simethicone Oral Suspension The patient states that she takes 1 Tablespoon as needed  . pravastatin (PRAVACHOL) 20 MG tablet Take 20 mg by mouth daily.   Marland Kitchen Propylene Glycol (SYSTANE BALANCE OP) Apply 2 drops  to eye daily as needed. FOR DRY EYES    . pseudoephedrine-guaifenesin (MUCINEX D) 60-600 MG per tablet Take 1 tablet by mouth as needed.  . temazepam (RESTORIL) 15 MG capsule Take 15 mg by mouth at bedtime as needed for sleep.  . valsartan (DIOVAN) 160 MG tablet Take 160 mg by mouth daily.   Marland Kitchen VITAMIN A PO Take 1 tablet by mouth daily.   No facility-administered encounter medications on file as of 04/05/2019.  Greater than 45 minutes to review old records from specialist, review 2020 labwork, discuss history with patient, examination, assessment and plan  Follow-up: 2 weeks-DM/insomnia  Nikeya Maxim Hannah Beat, MD

## 2019-04-12 ENCOUNTER — Other Ambulatory Visit: Payer: Self-pay | Admitting: Family Medicine

## 2019-04-12 DIAGNOSIS — E785 Hyperlipidemia, unspecified: Secondary | ICD-10-CM | POA: Diagnosis not present

## 2019-04-12 DIAGNOSIS — I10 Essential (primary) hypertension: Secondary | ICD-10-CM | POA: Diagnosis not present

## 2019-04-12 DIAGNOSIS — E039 Hypothyroidism, unspecified: Secondary | ICD-10-CM | POA: Diagnosis not present

## 2019-04-12 DIAGNOSIS — E118 Type 2 diabetes mellitus with unspecified complications: Secondary | ICD-10-CM | POA: Diagnosis not present

## 2019-04-13 LAB — COMPLETE METABOLIC PANEL WITH GFR
AG Ratio: 1.9 (calc) (ref 1.0–2.5)
ALT: 22 U/L (ref 6–29)
AST: 28 U/L (ref 10–35)
Albumin: 4.3 g/dL (ref 3.6–5.1)
Alkaline phosphatase (APISO): 66 U/L (ref 37–153)
BUN: 12 mg/dL (ref 7–25)
CO2: 25 mmol/L (ref 20–32)
Calcium: 8.8 mg/dL (ref 8.6–10.4)
Chloride: 105 mmol/L (ref 98–110)
Creat: 0.81 mg/dL (ref 0.60–0.93)
GFR, Est African American: 84 mL/min/{1.73_m2} (ref >=60)
GFR, Est Non African American: 73 mL/min/{1.73_m2} (ref >=60)
Globulin: 2.3 g/dL (calc) (ref 1.9–3.7)
Glucose, Bld: 97 mg/dL (ref 65–139)
Potassium: 3.9 mmol/L (ref 3.5–5.3)
Sodium: 139 mmol/L (ref 135–146)
Total Bilirubin: 0.3 mg/dL (ref 0.2–1.2)
Total Protein: 6.6 g/dL (ref 6.1–8.1)

## 2019-04-13 LAB — HEMOGLOBIN A1C
Hgb A1c MFr Bld: 6.2 % of total Hgb — ABNORMAL HIGH (ref <5.7)
Mean Plasma Glucose: 131 (calc)
eAG (mmol/L): 7.3 (calc)

## 2019-04-13 LAB — URINALYSIS
Bilirubin Urine: NEGATIVE
Hgb urine dipstick: NEGATIVE
Leukocytes,Ua: NEGATIVE
Nitrite: NEGATIVE
Protein, ur: NEGATIVE
Specific Gravity, Urine: 1.039 — ABNORMAL HIGH (ref 1.001–1.03)
pH: 6.5 (ref 5.0–8.0)

## 2019-04-13 LAB — TSH: TSH: 1.32 mIU/L (ref 0.40–4.50)

## 2019-04-18 ENCOUNTER — Telehealth (INDEPENDENT_AMBULATORY_CARE_PROVIDER_SITE_OTHER): Payer: PPO | Admitting: Family Medicine

## 2019-04-18 ENCOUNTER — Other Ambulatory Visit: Payer: Self-pay

## 2019-04-18 ENCOUNTER — Encounter: Payer: Self-pay | Admitting: Family Medicine

## 2019-04-18 VITALS — BP 120/63 | Ht 64.0 in | Wt 171.0 lb

## 2019-04-18 DIAGNOSIS — Z794 Long term (current) use of insulin: Secondary | ICD-10-CM

## 2019-04-18 DIAGNOSIS — E118 Type 2 diabetes mellitus with unspecified complications: Secondary | ICD-10-CM

## 2019-04-18 DIAGNOSIS — G4700 Insomnia, unspecified: Secondary | ICD-10-CM | POA: Diagnosis not present

## 2019-04-18 DIAGNOSIS — I10 Essential (primary) hypertension: Secondary | ICD-10-CM | POA: Diagnosis not present

## 2019-04-18 NOTE — Progress Notes (Signed)
Virtual Visit via Telephone Note  I connected with Rebecca Hartman on 04/18/19 at  9:40 AM EST by telephone and verified that I am speaking with the correct person using two identifiers. DOB/address  Location: Patient: home Provider: clinic   I discussed the limitations, risks, security and privacy concerns of performing an evaluation and management service by telephone and the availability of in person appointments. I also discussed with the patient that there may be a patient responsible charge related to this service. The patient expressed understanding and agreed to proceed.   History of Present Illness:DM-taking Jardiance and insulin Podiatry appt this week Decreased insulin Levemir to 20units at night    Observations/Objective: 04/07/19 am 141-fasting, 81-glucose prior to lunch 1:10pm 04/08/19 am 154-fasting, 98 glucose prior to lunch 1/9/21am 161-fasting Jardiance taken with morning meal 1/7 -163 prior to dinner, 1/8-119 prior to dinner 1/9-157 prior to dinner Assessment and Plan:  1. Hypertension, unspecified type cardizem-check bp daily diovan-check bp daily 2. Insomnia, unspecified type Trazodone-risk/benefit/side effects-trial  3. Controlled type 2 diabetes mellitus with complication, with long-term current use of insulin ([place])   Levemir-now taking 20units-continue I discussed the assessment and treatment plan with the patient. The patient was provided an opportunity to ask questions and all were answered. The patient agreed with the plan and demonstrated an understanding of the instructions.   The patient was advised to call back or seek an in-person evaluation if the symptoms worsen or if the condition fails to improve as anticipated.  I provided 12 minutes of non-face-to-face time during this encounter.   Churchill Grimsley Hannah Beat, MD

## 2019-04-20 DIAGNOSIS — E118 Type 2 diabetes mellitus with unspecified complications: Secondary | ICD-10-CM | POA: Insufficient documentation

## 2019-04-27 DIAGNOSIS — Z79899 Other long term (current) drug therapy: Secondary | ICD-10-CM | POA: Diagnosis not present

## 2019-04-27 DIAGNOSIS — M150 Primary generalized (osteo)arthritis: Secondary | ICD-10-CM | POA: Diagnosis not present

## 2019-04-27 DIAGNOSIS — R21 Rash and other nonspecific skin eruption: Secondary | ICD-10-CM | POA: Diagnosis not present

## 2019-04-27 DIAGNOSIS — M064 Inflammatory polyarthropathy: Secondary | ICD-10-CM | POA: Diagnosis not present

## 2019-04-27 DIAGNOSIS — M2550 Pain in unspecified joint: Secondary | ICD-10-CM | POA: Diagnosis not present

## 2019-05-02 ENCOUNTER — Other Ambulatory Visit: Payer: Self-pay | Admitting: Family Medicine

## 2019-05-09 ENCOUNTER — Other Ambulatory Visit: Payer: Self-pay | Admitting: Family Medicine

## 2019-05-09 DIAGNOSIS — M064 Inflammatory polyarthropathy: Secondary | ICD-10-CM | POA: Diagnosis not present

## 2019-05-10 ENCOUNTER — Telehealth (INDEPENDENT_AMBULATORY_CARE_PROVIDER_SITE_OTHER): Payer: PPO | Admitting: Family Medicine

## 2019-05-10 ENCOUNTER — Other Ambulatory Visit: Payer: Self-pay

## 2019-05-10 ENCOUNTER — Encounter: Payer: Self-pay | Admitting: Family Medicine

## 2019-05-10 VITALS — BP 120/63 | Ht 64.0 in | Wt 171.0 lb

## 2019-05-10 DIAGNOSIS — I10 Essential (primary) hypertension: Secondary | ICD-10-CM | POA: Diagnosis not present

## 2019-05-10 DIAGNOSIS — E118 Type 2 diabetes mellitus with unspecified complications: Secondary | ICD-10-CM | POA: Diagnosis not present

## 2019-05-10 DIAGNOSIS — J0101 Acute recurrent maxillary sinusitis: Secondary | ICD-10-CM | POA: Diagnosis not present

## 2019-05-10 MED ORDER — GABAPENTIN 300 MG PO CAPS: 300.0000 mg | ORAL_CAPSULE | Freq: Two times a day (BID) | ORAL | 3 refills | Status: DC

## 2019-05-10 MED ORDER — AMOXICILLIN 875 MG PO TABS: 875.0000 mg | ORAL_TABLET | Freq: Two times a day (BID) | ORAL | 0 refills | Status: DC

## 2019-05-10 NOTE — Progress Notes (Signed)
Virtual Visit via Telephone Note  I connected with Rebecca Hartman on 05/10/19 at  8:40 AM EST by telephone and verified that I am speaking with the correct person using two identifiers.DOB/Address  Location: Patient: home Provider: clinic   I discussed the limitations, risks, security and privacy concerns of performing an evaluation and management service by telephone and the availability of in person appointments. I also discussed with the patient that there may be a patient responsible charge related to this service. The patient expressed understanding and agreed to proceed.   History of Present Illness:  Pt with sinus congestion, blood nasal drainage. Pt notes she has experienced sinus infections in the past-"at least one a year" NO temp elevation. No cough. Facial pressure-around the eyes. Onset 2 weeks ago.  Sore throat from the drainage.  Nasal saline.  Netti Pot Observations/Objective: 97.8-temp 110/70-am bp Glucose 100-fasting, 120-130-after evening meal Assessment and Plan: 1. Acute recurrent maxillary sinusitis Amoxil-risk/benefit/side effect d/w pt-pt has taken Penicillin in the past, continue mucinex and Netti pot 2. Essential hypertension cardizem BID Metoprolol BID Valsartan qd Pt followed by cardiology for ongoing evaluation and treatment-no CP currently-no nitro 3. Controlled type 2 diabetes mellitus with complication, without long-term current use of insulin (HCC) Levemir 20units, Jardiance -pt states no low readings with change to 20units Levemir.  400 twice a day-neuropathy -cut from 800mg , d/w pt decrease to 300mg  BID -less side of effect of drowsiness.  Follow Up Instructions: Follow up if sinus infection does not resolve   I discussed the assessment and treatment plan with the patient. The patient was provided an opportunity to ask questions and all were answered. The patient agreed with the plan and demonstrated an understanding of the instructions.   The  patient was advised to call back or seek an in-person evaluation if the symptoms worsen or if the condition fails to improve as anticipated.  I provided 15 minutes of non-face-to-face time during this encounter.   Valyncia Wiens Hannah Beat, MD

## 2019-05-10 NOTE — Patient Instructions (Signed)
Continue mucinex, netti Pot Take Amoxil twice a day with food-prescription sent to pharmacy  Decrease Neurontin to 300mg  twice a day-presciption sent to pharmacy

## 2019-05-11 ENCOUNTER — Other Ambulatory Visit: Payer: Self-pay | Admitting: Family Medicine

## 2019-05-14 ENCOUNTER — Ambulatory Visit: Payer: PPO | Attending: Internal Medicine

## 2019-05-14 ENCOUNTER — Other Ambulatory Visit: Payer: Self-pay

## 2019-05-14 DIAGNOSIS — Z23 Encounter for immunization: Secondary | ICD-10-CM

## 2019-05-14 NOTE — Progress Notes (Signed)
   Covid-19 Vaccination Clinic  Name:  Rebecca Hartman    MRN: [id number] DOB: Aug 19, 1945  05/14/2019  Rebecca Hartman was observed post Covid-19 immunization for 15 minutes without incidence. She was provided with Vaccine Information Sheet and instruction to access the V-Safe system.   Rebecca Hartman was instructed to call 911 with any severe reactions post vaccine: Marland Kitchen Difficulty breathing  . Swelling of your face and throat  . A fast heartbeat  . A bad rash all over your body  . Dizziness and weakness    Immunizations Administered    Name Date Dose VIS Date Route   Moderna COVID-19 Vaccine 05/14/2019 12:27 PM 0.5 mL 03/01/2019 Intramuscular   Manufacturer: Moderna   Lot: [id number]   [id number]

## 2019-06-11 ENCOUNTER — Ambulatory Visit: Payer: PPO | Attending: Internal Medicine

## 2019-06-11 DIAGNOSIS — Z23 Encounter for immunization: Secondary | ICD-10-CM

## 2019-06-11 NOTE — Progress Notes (Signed)
   Covid-19 Vaccination Clinic  Name:  Rebecca Hartman    MRN: [id number] DOB: 12-12-1945  06/11/2019  Rebecca Hartman was observed post Covid-19 immunization for 15 minutes without incident. She was provided with Vaccine Information Sheet and instruction to access the V-Safe system.   Rebecca Hartman was instructed to call 911 with any severe reactions post vaccine: Marland Kitchen Difficulty breathing  . Swelling of face and throat  . A fast heartbeat  . A bad rash all over body  . Dizziness and weakness   Immunizations Administered    Name Date Dose VIS Date Route   Moderna COVID-19 Vaccine 06/11/2019 11:01 AM 0.5 mL 03/01/2019 Intramuscular   Manufacturer: Moderna   Lot: [id number]   [id number]

## 2019-06-23 ENCOUNTER — Other Ambulatory Visit: Payer: Self-pay | Admitting: Cardiology

## 2019-07-01 ENCOUNTER — Other Ambulatory Visit: Payer: Self-pay | Admitting: Family Medicine

## 2019-07-04 NOTE — Telephone Encounter (Signed)
Please advise 

## 2019-07-22 ENCOUNTER — Other Ambulatory Visit (HOSPITAL_COMMUNITY): Payer: Self-pay | Admitting: Internal Medicine

## 2019-07-22 DIAGNOSIS — G4700 Insomnia, unspecified: Secondary | ICD-10-CM | POA: Diagnosis not present

## 2019-07-22 DIAGNOSIS — E1165 Type 2 diabetes mellitus with hyperglycemia: Secondary | ICD-10-CM | POA: Diagnosis not present

## 2019-07-22 DIAGNOSIS — Z0189 Encounter for other specified special examinations: Secondary | ICD-10-CM | POA: Diagnosis not present

## 2019-07-22 DIAGNOSIS — L4059 Other psoriatic arthropathy: Secondary | ICD-10-CM | POA: Diagnosis not present

## 2019-07-22 DIAGNOSIS — K219 Gastro-esophageal reflux disease without esophagitis: Secondary | ICD-10-CM | POA: Diagnosis not present

## 2019-07-22 DIAGNOSIS — E785 Hyperlipidemia, unspecified: Secondary | ICD-10-CM | POA: Diagnosis not present

## 2019-07-22 DIAGNOSIS — F419 Anxiety disorder, unspecified: Secondary | ICD-10-CM | POA: Diagnosis not present

## 2019-07-22 DIAGNOSIS — E039 Hypothyroidism, unspecified: Secondary | ICD-10-CM | POA: Diagnosis not present

## 2019-07-22 DIAGNOSIS — J302 Other seasonal allergic rhinitis: Secondary | ICD-10-CM | POA: Diagnosis not present

## 2019-07-22 DIAGNOSIS — Z1231 Encounter for screening mammogram for malignant neoplasm of breast: Secondary | ICD-10-CM

## 2019-07-22 DIAGNOSIS — I10 Essential (primary) hypertension: Secondary | ICD-10-CM | POA: Diagnosis not present

## 2019-07-22 DIAGNOSIS — Z13820 Encounter for screening for osteoporosis: Secondary | ICD-10-CM

## 2019-07-27 DIAGNOSIS — Z6828 Body mass index (BMI) 28.0-28.9, adult: Secondary | ICD-10-CM | POA: Diagnosis not present

## 2019-07-27 DIAGNOSIS — M2550 Pain in unspecified joint: Secondary | ICD-10-CM | POA: Diagnosis not present

## 2019-07-27 DIAGNOSIS — E663 Overweight: Secondary | ICD-10-CM | POA: Diagnosis not present

## 2019-07-27 DIAGNOSIS — L4050 Arthropathic psoriasis, unspecified: Secondary | ICD-10-CM | POA: Diagnosis not present

## 2019-07-27 DIAGNOSIS — Z79899 Other long term (current) drug therapy: Secondary | ICD-10-CM | POA: Diagnosis not present

## 2019-07-27 DIAGNOSIS — M150 Primary generalized (osteo)arthritis: Secondary | ICD-10-CM | POA: Diagnosis not present

## 2019-07-29 ENCOUNTER — Other Ambulatory Visit: Payer: Self-pay | Admitting: Family Medicine

## 2019-08-01 NOTE — Telephone Encounter (Signed)
Please advise 

## 2019-08-03 ENCOUNTER — Ambulatory Visit (HOSPITAL_COMMUNITY)
Admission: RE | Admit: 2019-08-03 | Discharge: 2019-08-03 | Disposition: A | Discharge status: Home / Self Care | Payer: PPO | Source: Ambulatory Visit | Attending: Internal Medicine | Admitting: Internal Medicine

## 2019-08-03 ENCOUNTER — Other Ambulatory Visit: Payer: Self-pay

## 2019-08-03 DIAGNOSIS — Z13820 Encounter for screening for osteoporosis: Secondary | ICD-10-CM | POA: Diagnosis not present

## 2019-08-03 DIAGNOSIS — Z1231 Encounter for screening mammogram for malignant neoplasm of breast: Secondary | ICD-10-CM | POA: Diagnosis not present

## 2019-08-03 DIAGNOSIS — M810 Age-related osteoporosis without current pathological fracture: Secondary | ICD-10-CM | POA: Insufficient documentation

## 2019-08-08 ENCOUNTER — Other Ambulatory Visit: Payer: Self-pay | Admitting: Family Medicine

## 2019-08-10 NOTE — Progress Notes (Deleted)
Cardiology Office Note  Date: 08/10/2019   ID: Rebecca Hartman, DOB Jul 10, 1945, MRN [id number]  PCP:  [name], MD  Cardiologist:  Rozann Lesches, MD Electrophysiologist:  None   No chief complaint on file.   History of Present Illness: Rebecca Hartman is a 74 y.o. female last assessed via telehealth encounter in December 2020.  Past Medical History:  Diagnosis Date  . Abnormal myocardial perfusion study    November 2015, mid to apical anteroseptal ischemia   . Anemia   . Anxiety   . Arthritis   . Blood transfusion without reported diagnosis   . Cataract   . Chronic back pain   . Essential hypertension   . Fibromyalgia   . Hiatal hernia   . Hypercholesteremia   . Hypothyroidism   . Osteoporosis   . Type 2 diabetes mellitus ([place])     Past Surgical History:  Procedure Laterality Date  . ABDOMINAL HYSTERECTOMY    . BACK SURGERY    . C-EYE SURGERY PROCEDURE    . CARPAL TUNNEL RELEASE    . CATARACT EXTRACTION    . CHOLECYSTECTOMY    . COLONOSCOPY  08/06/06 [facility]  . DENTAL SURGERY  04/2015   reconstructive surgery lower  . ESOPHAGOGASTRODUODENOSCOPY  10/31/2010   Procedure: ESOPHAGOGASTRODUODENOSCOPY (EGD);  Surgeon: [name], MD;  [facility];  Service: Endoscopy;  Laterality: N/A;  . ESOPHAGOGASTRODUODENOSCOPY  03/05/2011   Procedure: ESOPHAGOGASTRODUODENOSCOPY (EGD);  Surgeon: [name], MD;  [facility];  Service: Endoscopy;  Laterality: N/A;  12:00  . ESOPHAGOGASTRODUODENOSCOPY  04/28/2012   Procedure: ESOPHAGOGASTRODUODENOSCOPY (EGD);  Surgeon: [name], MD;  [facility];  Service: Endoscopy;  Laterality: N/A;  140-moved to 10:30 Ann notified pt  . EYE SURGERY    . TONSILLECTOMY    . TOTAL HIP REVISION    . UPPER GASTROINTESTINAL ENDOSCOPY  10/03/2010   EGD DILATN PYLORIC CHANNEL  . UPPER GASTROINTESTINAL ENDOSCOPY  07/08/2010  . UPPER GASTROINTESTINAL ENDOSCOPY  08/06/06   FIELDS  . UPPER  GASTROINTESTINAL ENDOSCOPY  06/29/2008   EGD    Current Outpatient Medications  Medication Sig Dispense Refill  . acetaminophen (TYLENOL) 500 MG tablet Take 500 mg by mouth as needed.    Marland Kitchen amoxicillin (AMOXIL) 875 MG tablet Take 1 tablet (875 mg total) by mouth 2 (two) times daily. 20 tablet 0  . diltiazem (CARDIZEM) 120 MG tablet Take 120 mg by mouth 2 (two) times daily.     . empagliflozin (JARDIANCE) 10 MG TABS tablet Take 10 mg by mouth daily.    Marland Kitchen escitalopram (LEXAPRO) 10 MG tablet Take 10 mg by mouth daily.     Marland Kitchen etanercept (ENBREL) 50 MG/ML injection Inject 50 mg into the skin once a week.    . folic acid (FOLVITE) 1 MG tablet Take 1 mg by mouth daily.    Marland Kitchen gabapentin (NEURONTIN) 300 MG capsule Take 1 capsule (300 mg total) by mouth 2 (two) times daily. 60 capsule 3  . hydroxychloroquine (PLAQUENIL) 200 MG tablet Take 200 mg by mouth 2 (two) times daily.     . insulin detemir (LEVEMIR) 100 UNIT/ML injection Take 20 units at night 15 mL 2  . isosorbide mononitrate (IMDUR) 30 MG 24 hr tablet TAKE 1 TABLET BY MOUTH ONCE DAILY. 90 tablet 0  . levothyroxine (SYNTHROID, LEVOTHROID) 50 MCG tablet Take 50 mcg by mouth daily.      . methotrexate 2.5 MG tablet Take  2.5 mg by mouth once a week. 10 tablets on Wednesdays    . metoprolol tartrate (LOPRESSOR) 50 MG tablet TAKE (1) TABLET BY MOUTH TWICE DAILY. 180 tablet 0  . nitroGLYCERIN (NITROSTAT) 0.4 MG SL tablet Place 1 tablet (0.4 mg total) under the tongue every 5 (five) minutes as needed for chest pain. 25 tablet 3  . ondansetron (ZOFRAN) 4 MG tablet Take 4 mg by mouth every 8 (eight) hours as needed for nausea or vomiting.    Marland Kitchen OVER THE COUNTER MEDICATION OTC Antacid Magnesia/ Simethicone Oral Suspension The patient states that she takes 1 Tablespoon as needed    . pantoprazole (PROTONIX) 40 MG tablet TAKE 1 TABLET BY MOUTH ONCE DAILY. 90 tablet 0  . pravastatin (PRAVACHOL) 20 MG tablet Take 20 mg by mouth daily.     Marland Kitchen Propylene Glycol  (SYSTANE BALANCE OP) Apply 2 drops to eye daily as needed. FOR DRY EYES      . pseudoephedrine-guaifenesin (MUCINEX D) 60-600 MG per tablet Take 1 tablet by mouth as needed.    . SURE COMFORT PEN NEEDLES 31G X 8 MM MISC USE AS DIRECTED DAILY. 100 each 0  . traZODone (DESYREL) 50 MG tablet TAKE (1) TABLET BY MOUTH AT BEDTIME. 30 tablet 0  . valsartan (DIOVAN) 160 MG tablet Take 160 mg by mouth daily.     Marland Kitchen VITAMIN A PO Take 1 tablet by mouth daily.     No current facility-administered medications for this visit.   Allergies:  Shellfish allergy, Tussigon [hydrocodone-homatropine], Hydrocodone, Glyburide, Levofloxacin, Lisinopril, Morphine, and Pantoprazole sodium [pantoprazole sodium]   ROS:  Please see the history of present illness. Otherwise, complete review of systems is positive for {NONE DEFAULTED:18576::"none"}.  All other systems are reviewed and negative.   Physical Exam: VS:  There were no vitals taken for this visit., BMI There is no height or weight on file to calculate BMI.  Wt Readings from Last 3 Encounters:  05/10/19 171 lb (77.6 kg)  04/18/19 171 lb (77.6 kg)  04/05/19 171 lb 12.8 oz (77.9 kg)    General: Patient appears comfortable at rest. HEENT: Conjunctiva and lids normal, oropharynx clear with moist mucosa. Neck: Supple, no elevated JVP or carotid bruits, no thyromegaly. Lungs: Clear to auscultation, nonlabored breathing at rest. Cardiac: Regular rate and rhythm, no S3 or significant systolic murmur, no pericardial rub. Abdomen: Soft, nontender, no hepatomegaly, bowel sounds present, no guarding or rebound. Extremities: No pitting edema, distal pulses 2+. Skin: Warm and dry. Musculoskeletal: No kyphosis. Neuropsychiatric: Alert and oriented x3, affect grossly appropriate.  ECG:  An ECG dated 07/30/2017 was personally reviewed today and demonstrated:  Normal sinus rhythm.  Recent Labwork: 04/12/2019: ALT 22; AST 28; BUN 12; Creat 0.81; Potassium 3.9; Sodium 139;  TSH 1.32     Component Value Date/Time   CHOL 115 04/16/2018 0000   TRIG 93 04/16/2018 0000   HDL 39 04/16/2018 0000   [place] 58 04/16/2018 0000    Other Studies Reviewed Today:  Echocardiogram 01/02/2016: Study Conclusions  - Left ventricle: The cavity size was normal. Wall thickness was increased in a pattern of mild LVH. Systolic function was normal. The estimated ejection fraction was in the range of 60% to 65%. Wall motion was normal; there were no regional wall motion abnormalities. Doppler parameters are consistent with abnormal left ventricular relaxation (grade 1 diastolic dysfunction). - Aortic valve: Mildly calcified annulus. Trileaflet; mildly calcified leaflets. There was mild regurgitation. Mean gradient (S): 7 mm Hg. Peak gradient (  S): 16 mm Hg. Valve area (VTI): 2.21 cm^2. - Mitral valve: Calcified annulus. There was trivial regurgitation. - Left atrium: The atrium was mildly dilated. - Right atrium: Central venous pressure (est): 3 mm Hg. - Tricuspid valve: There was trivial regurgitation. - Pulmonary arteries: PA peak pressure: 19 mm Hg (S). - Pericardium, extracardiac: There was no pericardial effusion.  Impressions:  - Mild LVH with LVEF 60-65%. Grade 1 diastolic dysfunction. Mild left atrial enlargement. MAC with trivial mitral regurgitation. Mildly calcified aortic valve with mild aortic regurgitation. Trivial tricuspid regurgitation with normal estimated PASP.  Lexiscan Myoview 02/06/2014: IMPRESSION: 1. Study consistent with mid to apical anteroseptal ischemia as detailed above.  2. Normal left ventricular wall motion.  3. Left ventricular ejection fraction 72%  4. Intermediate-risk stress test findings*.  Assessment and Plan:    Medication Adjustments/Labs and Tests Ordered: Current medicines are reviewed at length with the patient today.  Concerns regarding medicines are outlined above.   Tests Ordered: No  orders of the defined types were placed in this encounter.   Medication Changes: No orders of the defined types were placed in this encounter.   Disposition:  Follow up {follow up:15908}  Signed, Satira Sark, MD, [facility] 08/10/2019 10:28 AM    [facility] [place], [place], [place] [address] Phone: [phone]; Fax: [phone]

## 2019-08-11 ENCOUNTER — Ambulatory Visit: Payer: PPO | Admitting: Cardiology

## 2019-08-30 ENCOUNTER — Encounter: Payer: Self-pay | Admitting: Cardiology

## 2019-08-30 ENCOUNTER — Other Ambulatory Visit: Payer: Self-pay | Admitting: Family Medicine

## 2019-08-30 ENCOUNTER — Ambulatory Visit (INDEPENDENT_AMBULATORY_CARE_PROVIDER_SITE_OTHER): Payer: PPO | Admitting: Cardiology

## 2019-08-30 ENCOUNTER — Other Ambulatory Visit: Payer: Self-pay

## 2019-08-30 VITALS — BP 118/62 | HR 58 | Ht 65.0 in | Wt 165.0 lb

## 2019-08-30 DIAGNOSIS — R9439 Abnormal result of other cardiovascular function study: Secondary | ICD-10-CM | POA: Diagnosis not present

## 2019-08-30 DIAGNOSIS — R011 Cardiac murmur, unspecified: Secondary | ICD-10-CM | POA: Diagnosis not present

## 2019-08-30 DIAGNOSIS — I10 Essential (primary) hypertension: Secondary | ICD-10-CM | POA: Diagnosis not present

## 2019-08-30 NOTE — Telephone Encounter (Signed)
Please advise 

## 2019-08-30 NOTE — Patient Instructions (Addendum)
Medication Instructions:   Your physician recommends that you continue on your current medications as directed. Please refer to the Current Medication list given to you today.  Labwork:  NONE  Testing/Procedures: Your physician has requested that you have an echocardiogram at [facility]. Echocardiography is a painless test that uses sound waves to create images of your heart. It provides your doctor with information about the size and shape of your heart and how well your heart's chambers and valves are working. This procedure takes approximately one hour. There are no restrictions for this procedure.  Follow-Up:  Your physician recommends that you schedule a follow-up appointment in: 6 months ([place] office).  Any Other Special Instructions Will Be Listed Below (If Applicable).  If you need a refill on your cardiac medications before your next appointment, please call your pharmacy.

## 2019-08-30 NOTE — Progress Notes (Signed)
Cardiology Office Note  Date: 08/30/2019   ID: Rebecca Hartman, DOB July 10, 1945, MRN [id number]  PCP:  Celene Squibb, MD  Cardiologist:  Rozann Lesches, MD Electrophysiologist:  None   Chief Complaint  Patient presents with  . Cardiac follow-up    History of Present Illness: Rebecca Hartman is a 74 y.o. female last assessed via telehealth encounter in December 2020.  She presents for a routine visit.  Overall doing well, she does not report any active chest pain with exertion, no use of nitroglycerin.  I personally reviewed her ECG today which shows a sinus bradycardia.  We went over her medications and she remains on Cardizem CD, Lopressor, Imdur, Pravachol, and has nitroglycerin available.  Her last echocardiogram was in 2017.  Past Medical History:  Diagnosis Date  . Abnormal myocardial perfusion study    November 2015, mid to apical anteroseptal ischemia   . Anemia   . Anxiety   . Arthritis   . Cataract   . Chronic back pain   . Essential hypertension   . Fibromyalgia   . Hiatal hernia   . History of blood transfusion   . Hypercholesteremia   . Hypothyroidism   . Osteoporosis   . Type 2 diabetes mellitus ([place])     Past Surgical History:  Procedure Laterality Date  . ABDOMINAL HYSTERECTOMY    . BACK SURGERY    . C-EYE SURGERY PROCEDURE    . CARPAL TUNNEL RELEASE    . CATARACT EXTRACTION    . CHOLECYSTECTOMY    . COLONOSCOPY  08/06/06 [facility]  . DENTAL SURGERY  04/2015   reconstructive surgery lower  . ESOPHAGOGASTRODUODENOSCOPY  10/31/2010   Procedure: ESOPHAGOGASTRODUODENOSCOPY (EGD);  Surgeon: [name], MD;  [facility];  Service: Endoscopy;  Laterality: N/A;  . ESOPHAGOGASTRODUODENOSCOPY  03/05/2011   Procedure: ESOPHAGOGASTRODUODENOSCOPY (EGD);  Surgeon: [name], MD;  [facility];  Service: Endoscopy;  Laterality: N/A;  12:00  . ESOPHAGOGASTRODUODENOSCOPY  04/28/2012   Procedure: ESOPHAGOGASTRODUODENOSCOPY (EGD);   Surgeon: [name], MD;  [facility];  Service: Endoscopy;  Laterality: N/A;  140-moved to 10:30 Ann notified pt  . EYE SURGERY    . TONSILLECTOMY    . TOTAL HIP REVISION    . UPPER GASTROINTESTINAL ENDOSCOPY  10/03/2010   EGD DILATN PYLORIC CHANNEL  . UPPER GASTROINTESTINAL ENDOSCOPY  07/08/2010  . UPPER GASTROINTESTINAL ENDOSCOPY  08/06/06   FIELDS  . UPPER GASTROINTESTINAL ENDOSCOPY  06/29/2008   EGD    Current Outpatient Medications  Medication Sig Dispense Refill  . acetaminophen (TYLENOL) 500 MG tablet Take 500 mg by mouth as needed.    . diltiazem (CARDIZEM) 120 MG tablet Take 120 mg by mouth 2 (two) times daily.     . empagliflozin (JARDIANCE) 10 MG TABS tablet Take 10 mg by mouth daily.    Marland Kitchen escitalopram (LEXAPRO) 10 MG tablet Take 10 mg by mouth daily.     Marland Kitchen etanercept (ENBREL) 50 MG/ML injection Inject 50 mg into the skin once a week.    . folic acid (FOLVITE) 1 MG tablet Take 1 mg by mouth daily.    Marland Kitchen gabapentin (NEURONTIN) 300 MG capsule TAKE (1) CAPSULE BY MOUTH TWICE DAILY. 60 capsule 0  . hydroxychloroquine (PLAQUENIL) 200 MG tablet Take 200 mg by mouth 2 (two) times daily.     . insulin detemir (LEVEMIR) 100 UNIT/ML injection Take 20 units at night 15 mL 2  . isosorbide mononitrate (  IMDUR) 30 MG 24 hr tablet TAKE 1 TABLET BY MOUTH ONCE DAILY. 90 tablet 0  . levothyroxine (SYNTHROID, LEVOTHROID) 50 MCG tablet Take 50 mcg by mouth daily.      . methotrexate 2.5 MG tablet Take 2.5 mg by mouth once a week. 10 tablets on Wednesdays    . metoprolol tartrate (LOPRESSOR) 50 MG tablet TAKE (1) TABLET BY MOUTH TWICE DAILY. 180 tablet 0  . nitroGLYCERIN (NITROSTAT) 0.4 MG SL tablet Place 1 tablet (0.4 mg total) under the tongue every 5 (five) minutes as needed for chest pain. 25 tablet 3  . ondansetron (ZOFRAN) 4 MG tablet Take 4 mg by mouth every 8 (eight) hours as needed for nausea or vomiting.    Marland Kitchen OVER THE COUNTER MEDICATION OTC Antacid Magnesia/ Simethicone  Oral Suspension The patient states that she takes 1 Tablespoon as needed    . pantoprazole (PROTONIX) 40 MG tablet TAKE 1 TABLET BY MOUTH ONCE DAILY. 90 tablet 0  . pravastatin (PRAVACHOL) 20 MG tablet Take 20 mg by mouth daily.     Marland Kitchen Propylene Glycol (SYSTANE BALANCE OP) Apply 2 drops to eye daily as needed. FOR DRY EYES      . pseudoephedrine-guaifenesin (MUCINEX D) 60-600 MG per tablet Take 1 tablet by mouth as needed.    . SURE COMFORT PEN NEEDLES 31G X 8 MM MISC USE AS DIRECTED DAILY. 100 each 0  . traZODone (DESYREL) 50 MG tablet TAKE (1) TABLET BY MOUTH AT BEDTIME. 30 tablet 0  . valsartan (DIOVAN) 160 MG tablet Take 160 mg by mouth daily.     Marland Kitchen VITAMIN A PO Take 1 tablet by mouth daily.     No current facility-administered medications for this visit.   Allergies:  Shellfish allergy, Tussigon [hydrocodone-homatropine], Hydrocodone, Glyburide, Levofloxacin, Lisinopril, Morphine, and Pantoprazole sodium [pantoprazole sodium]   ROS:  No palpitations or syncope.  Physical Exam: VS:  BP 118/62   Pulse (!) 58   Ht _0  (1.651 m)   Wt 165 lb (74.8 kg)   SpO2 97%   BMI 27.46 kg/m , BMI Body mass index is 27.46 kg/m.  Wt Readings from Last 3 Encounters:  08/30/19 165 lb (74.8 kg)  05/10/19 171 lb (77.6 kg)  04/18/19 171 lb (77.6 kg)    General: Patient appears comfortable at rest. HEENT: Conjunctiva and lids normal, wearing a mask. Neck: Supple, no elevated JVP or carotid bruits, no thyromegaly. Lungs: Clear to auscultation, nonlabored breathing at rest. Cardiac: Regular rate and rhythm, no S3, 3/6 systolic murmur, no pericardial rub. Extremities: No pitting edema, distal pulses 2+.  ECG:  An ECG dated 07/30/2017 was personally reviewed today and demonstrated:  Normal sinus rhythm.  Recent Labwork: 04/12/2019: ALT 22; AST 28; BUN 12; Creat 0.81; Potassium 3.9; Sodium 139; TSH 1.32     Component Value Date/Time   CHOL 115 04/16/2018 0000   TRIG 93 04/16/2018 0000   HDL 39  04/16/2018 0000   [place] 58 04/16/2018 0000    Other Studies Reviewed Today:  Echocardiogram 01/02/2016: Study Conclusions  - Left ventricle: The cavity size was normal. Wall thickness was increased in a pattern of mild LVH. Systolic function was normal. The estimated ejection fraction was in the range of 60% to 65%. Wall motion was normal; there were no regional wall motion abnormalities. Doppler parameters are consistent with abnormal left ventricular relaxation (grade 1 diastolic dysfunction). - Aortic valve: Mildly calcified annulus. Trileaflet; mildly calcified leaflets. There was mild regurgitation. Mean gradient (S):  7 mm Hg. Peak gradient (S): 16 mm Hg. Valve area (VTI): 2.21 cm^2. - Mitral valve: Calcified annulus. There was trivial regurgitation. - Left atrium: The atrium was mildly dilated. - Right atrium: Central venous pressure (est): 3 mm Hg. - Tricuspid valve: There was trivial regurgitation. - Pulmonary arteries: PA peak pressure: 19 mm Hg (S). - Pericardium, extracardiac: There was no pericardial effusion.  Impressions:  - Mild LVH with LVEF 60-65%. Grade 1 diastolic dysfunction. Mild left atrial enlargement. MAC with trivial mitral regurgitation. Mildly calcified aortic valve with mild aortic regurgitation. Trivial tricuspid regurgitation with normal estimated PASP.  Lexiscan Myoview 02/06/2014: IMPRESSION: 1. Study consistent with mid to apical anteroseptal ischemia as detailed above.  2. Normal left ventricular wall motion.  3. Left ventricular ejection fraction 72%  4. Intermediate-risk stress test findings*.  Assessment and Plan:  1.  History of abnormal Myoview indicating mid to apical anteroseptal ischemia.  She does not describe any recurring exertional chest pain and has preferred medical therapy over time.  Continue Lopressor, Cardizem CD, Imdur, and Pravachol.  I reviewed her ECG.  2.  Essential hypertension,  blood pressure is well controlled today.  In addition to the above medications she is also on Diovan.  3.  Heart murmur with sclerotic aortic valve and mild aortic regurgitation by echocardiogram in 2017.  We will obtain a follow-up study.  Medication Adjustments/Labs and Tests Ordered: Current medicines are reviewed at length with the patient today.  Concerns regarding medicines are outlined above.   Tests Ordered: Orders Placed This Encounter  Procedures  . EKG 12-Lead  . ECHOCARDIOGRAM COMPLETE    Medication Changes: No orders of the defined types were placed in this encounter.   Disposition:  Follow up 6 months in the [place] office.  Signed, Satira Sark, MD, [facility] 08/30/2019 3:49 PM    [facility] [facility] [place], [place],  [address] Phone: [phone]; Fax: [phone]

## 2019-09-01 DIAGNOSIS — D696 Thrombocytopenia, unspecified: Secondary | ICD-10-CM | POA: Diagnosis not present

## 2019-09-09 ENCOUNTER — Other Ambulatory Visit: Payer: Self-pay

## 2019-09-09 ENCOUNTER — Ambulatory Visit (HOSPITAL_COMMUNITY)
Admission: RE | Admit: 2019-09-09 | Discharge: 2019-09-09 | Disposition: A | Discharge status: Home / Self Care | Payer: PPO | Source: Ambulatory Visit | Attending: Cardiology | Admitting: Cardiology

## 2019-09-09 DIAGNOSIS — R011 Cardiac murmur, unspecified: Secondary | ICD-10-CM | POA: Diagnosis not present

## 2019-09-09 NOTE — Progress Notes (Signed)
*  PRELIMINARY RESULTS* Echocardiogram 2D Echocardiogram has been performed.  Rebecca Hartman 09/09/2019, 10:31 AM

## 2019-09-19 DIAGNOSIS — H16223 Keratoconjunctivitis sicca, not specified as Sjogren's, bilateral: Secondary | ICD-10-CM | POA: Diagnosis not present

## 2019-09-19 DIAGNOSIS — H26493 Other secondary cataract, bilateral: Secondary | ICD-10-CM | POA: Diagnosis not present

## 2019-09-19 DIAGNOSIS — Z961 Presence of intraocular lens: Secondary | ICD-10-CM | POA: Diagnosis not present

## 2019-09-19 DIAGNOSIS — M0570 Rheumatoid arthritis with rheumatoid factor of unspecified site without organ or systems involvement: Secondary | ICD-10-CM | POA: Diagnosis not present

## 2019-09-20 ENCOUNTER — Other Ambulatory Visit: Payer: Self-pay | Admitting: Cardiology

## 2019-09-26 DIAGNOSIS — E785 Hyperlipidemia, unspecified: Secondary | ICD-10-CM | POA: Diagnosis not present

## 2019-09-26 DIAGNOSIS — E1165 Type 2 diabetes mellitus with hyperglycemia: Secondary | ICD-10-CM | POA: Diagnosis not present

## 2019-09-26 DIAGNOSIS — D519 Vitamin B12 deficiency anemia, unspecified: Secondary | ICD-10-CM | POA: Diagnosis not present

## 2019-09-26 DIAGNOSIS — E559 Vitamin D deficiency, unspecified: Secondary | ICD-10-CM | POA: Diagnosis not present

## 2019-09-26 DIAGNOSIS — K219 Gastro-esophageal reflux disease without esophagitis: Secondary | ICD-10-CM | POA: Diagnosis not present

## 2019-09-26 DIAGNOSIS — I10 Essential (primary) hypertension: Secondary | ICD-10-CM | POA: Diagnosis not present

## 2019-10-03 DIAGNOSIS — G4700 Insomnia, unspecified: Secondary | ICD-10-CM | POA: Diagnosis not present

## 2019-10-03 DIAGNOSIS — D649 Anemia, unspecified: Secondary | ICD-10-CM | POA: Diagnosis not present

## 2019-10-03 DIAGNOSIS — J302 Other seasonal allergic rhinitis: Secondary | ICD-10-CM | POA: Diagnosis not present

## 2019-10-03 DIAGNOSIS — F419 Anxiety disorder, unspecified: Secondary | ICD-10-CM | POA: Diagnosis not present

## 2019-10-03 DIAGNOSIS — E1165 Type 2 diabetes mellitus with hyperglycemia: Secondary | ICD-10-CM | POA: Diagnosis not present

## 2019-10-03 DIAGNOSIS — E039 Hypothyroidism, unspecified: Secondary | ICD-10-CM | POA: Diagnosis not present

## 2019-10-03 DIAGNOSIS — E785 Hyperlipidemia, unspecified: Secondary | ICD-10-CM | POA: Diagnosis not present

## 2019-10-03 DIAGNOSIS — I10 Essential (primary) hypertension: Secondary | ICD-10-CM | POA: Diagnosis not present

## 2019-10-03 DIAGNOSIS — K219 Gastro-esophageal reflux disease without esophagitis: Secondary | ICD-10-CM | POA: Diagnosis not present

## 2019-10-03 DIAGNOSIS — L4059 Other psoriatic arthropathy: Secondary | ICD-10-CM | POA: Diagnosis not present

## 2019-10-26 DIAGNOSIS — Z79899 Other long term (current) drug therapy: Secondary | ICD-10-CM | POA: Diagnosis not present

## 2019-10-26 DIAGNOSIS — Z6828 Body mass index (BMI) 28.0-28.9, adult: Secondary | ICD-10-CM | POA: Diagnosis not present

## 2019-10-26 DIAGNOSIS — M2550 Pain in unspecified joint: Secondary | ICD-10-CM | POA: Diagnosis not present

## 2019-10-26 DIAGNOSIS — M064 Inflammatory polyarthropathy: Secondary | ICD-10-CM | POA: Diagnosis not present

## 2019-10-26 DIAGNOSIS — M150 Primary generalized (osteo)arthritis: Secondary | ICD-10-CM | POA: Diagnosis not present

## 2019-10-26 DIAGNOSIS — E663 Overweight: Secondary | ICD-10-CM | POA: Diagnosis not present

## 2019-10-28 ENCOUNTER — Other Ambulatory Visit: Payer: Self-pay | Admitting: Family Medicine

## 2019-11-06 ENCOUNTER — Other Ambulatory Visit: Payer: Self-pay | Admitting: Family Medicine

## 2019-11-10 ENCOUNTER — Other Ambulatory Visit: Payer: Self-pay | Admitting: Family Medicine

## 2019-12-14 DIAGNOSIS — Z23 Encounter for immunization: Secondary | ICD-10-CM | POA: Diagnosis not present

## 2019-12-14 DIAGNOSIS — G4700 Insomnia, unspecified: Secondary | ICD-10-CM | POA: Diagnosis not present

## 2020-01-09 ENCOUNTER — Ambulatory Visit (HOSPITAL_COMMUNITY)
Admission: RE | Admit: 2020-01-09 | Discharge: 2020-01-09 | Disposition: A | Discharge status: Home / Self Care | Payer: PPO | Source: Ambulatory Visit | Attending: Internal Medicine | Admitting: Internal Medicine

## 2020-01-09 ENCOUNTER — Other Ambulatory Visit (HOSPITAL_COMMUNITY): Payer: Self-pay | Admitting: Internal Medicine

## 2020-01-09 ENCOUNTER — Other Ambulatory Visit: Payer: Self-pay

## 2020-01-09 DIAGNOSIS — W19XXXA Unspecified fall, initial encounter: Secondary | ICD-10-CM | POA: Diagnosis not present

## 2020-01-09 DIAGNOSIS — M4319 Spondylolisthesis, multiple sites in spine: Secondary | ICD-10-CM | POA: Diagnosis not present

## 2020-01-09 DIAGNOSIS — Z0000 Encounter for general adult medical examination without abnormal findings: Secondary | ICD-10-CM | POA: Diagnosis not present

## 2020-01-09 DIAGNOSIS — M533 Sacrococcygeal disorders, not elsewhere classified: Secondary | ICD-10-CM | POA: Insufficient documentation

## 2020-01-09 DIAGNOSIS — S300XXA Contusion of lower back and pelvis, initial encounter: Secondary | ICD-10-CM | POA: Diagnosis not present

## 2020-01-09 DIAGNOSIS — M5136 Other intervertebral disc degeneration, lumbar region: Secondary | ICD-10-CM | POA: Diagnosis not present

## 2020-01-09 DIAGNOSIS — M5137 Other intervertebral disc degeneration, lumbosacral region: Secondary | ICD-10-CM | POA: Diagnosis not present

## 2020-01-09 DIAGNOSIS — M549 Dorsalgia, unspecified: Secondary | ICD-10-CM | POA: Diagnosis not present

## 2020-01-18 DIAGNOSIS — L4059 Other psoriatic arthropathy: Secondary | ICD-10-CM | POA: Diagnosis not present

## 2020-01-18 DIAGNOSIS — K219 Gastro-esophageal reflux disease without esophagitis: Secondary | ICD-10-CM | POA: Diagnosis not present

## 2020-01-18 DIAGNOSIS — I10 Essential (primary) hypertension: Secondary | ICD-10-CM | POA: Diagnosis not present

## 2020-01-18 DIAGNOSIS — E785 Hyperlipidemia, unspecified: Secondary | ICD-10-CM | POA: Diagnosis not present

## 2020-01-18 DIAGNOSIS — D649 Anemia, unspecified: Secondary | ICD-10-CM | POA: Diagnosis not present

## 2020-01-18 DIAGNOSIS — J302 Other seasonal allergic rhinitis: Secondary | ICD-10-CM | POA: Diagnosis not present

## 2020-01-18 DIAGNOSIS — E559 Vitamin D deficiency, unspecified: Secondary | ICD-10-CM | POA: Diagnosis not present

## 2020-01-18 DIAGNOSIS — E1165 Type 2 diabetes mellitus with hyperglycemia: Secondary | ICD-10-CM | POA: Diagnosis not present

## 2020-01-18 DIAGNOSIS — E039 Hypothyroidism, unspecified: Secondary | ICD-10-CM | POA: Diagnosis not present

## 2020-01-18 DIAGNOSIS — F419 Anxiety disorder, unspecified: Secondary | ICD-10-CM | POA: Diagnosis not present

## 2020-01-18 DIAGNOSIS — G4700 Insomnia, unspecified: Secondary | ICD-10-CM | POA: Diagnosis not present

## 2020-01-18 DIAGNOSIS — Z0001 Encounter for general adult medical examination with abnormal findings: Secondary | ICD-10-CM | POA: Diagnosis not present

## 2020-01-26 DIAGNOSIS — M2550 Pain in unspecified joint: Secondary | ICD-10-CM | POA: Diagnosis not present

## 2020-01-26 DIAGNOSIS — M7071 Other bursitis of hip, right hip: Secondary | ICD-10-CM | POA: Diagnosis not present

## 2020-01-26 DIAGNOSIS — M3500 Sicca syndrome, unspecified: Secondary | ICD-10-CM | POA: Diagnosis not present

## 2020-01-26 DIAGNOSIS — D649 Anemia, unspecified: Secondary | ICD-10-CM | POA: Diagnosis not present

## 2020-01-26 DIAGNOSIS — L4050 Arthropathic psoriasis, unspecified: Secondary | ICD-10-CM | POA: Diagnosis not present

## 2020-01-26 DIAGNOSIS — M150 Primary generalized (osteo)arthritis: Secondary | ICD-10-CM | POA: Diagnosis not present

## 2020-01-26 DIAGNOSIS — Z6828 Body mass index (BMI) 28.0-28.9, adult: Secondary | ICD-10-CM | POA: Diagnosis not present

## 2020-01-26 DIAGNOSIS — E663 Overweight: Secondary | ICD-10-CM | POA: Diagnosis not present

## 2020-01-26 DIAGNOSIS — Z79899 Other long term (current) drug therapy: Secondary | ICD-10-CM | POA: Diagnosis not present

## 2020-02-02 DIAGNOSIS — Z96641 Presence of right artificial hip joint: Secondary | ICD-10-CM | POA: Diagnosis not present

## 2020-02-29 NOTE — Progress Notes (Signed)
Cardiology Office Note  Date: 03/01/2020   ID: Rebecca Hartman, Rebecca Hartman 11-03-1945, MRN [id number]  PCP:  Celene Squibb, MD  Cardiologist:  Rozann Lesches, MD Electrophysiologist:  None   Chief Complaint  Patient presents with  . Cardiac follow-up    History of Present Illness: Rebecca Hartman is a 74 y.o. female last seen in June.  She is here for a routine visit.  Overall doing well from a cardiac perspective, she does not report any obvious angina symptoms or worsening shortness of breath with typical activities.  I reviewed her medications are outlined below and stable.  She continues to follow-up with Dr. Juel Burrow office for primary care.  Current cardiac regimen includes Cardizem CD, Imdur, Lopressor, Diovan, and Pravachol.  Today's blood pressure is normal.  Follow-up echocardiogram in June of this year revealed LVEF 60 to 65% with mild LVH, sclerotic aortic valve without stenosis.  Past Medical History:  Diagnosis Date  . Abnormal myocardial perfusion study    November 2015, mid to apical anteroseptal ischemia   . Anemia   . Anxiety   . Arthritis   . Cataract   . Chronic back pain   . Essential hypertension   . Fibromyalgia   . Hiatal hernia   . History of blood transfusion   . Hypercholesteremia   . Hypothyroidism   . Osteoporosis   . Type 2 diabetes mellitus ([place])     Past Surgical History:  Procedure Laterality Date  . ABDOMINAL HYSTERECTOMY    . BACK SURGERY    . C-EYE SURGERY PROCEDURE    . CARPAL TUNNEL RELEASE    . CATARACT EXTRACTION    . CHOLECYSTECTOMY    . COLONOSCOPY  08/06/06 FIELDS  . DENTAL SURGERY  04/2015   reconstructive surgery lower  . ESOPHAGOGASTRODUODENOSCOPY  10/31/2010   Procedure: ESOPHAGOGASTRODUODENOSCOPY (EGD);  Surgeon: [name], MD;  [facility];  Service: Endoscopy;  Laterality: N/A;  . ESOPHAGOGASTRODUODENOSCOPY  03/05/2011   Procedure: ESOPHAGOGASTRODUODENOSCOPY (EGD);  Surgeon: [name], MD;   [facility];  Service: Endoscopy;  Laterality: N/A;  12:00  . ESOPHAGOGASTRODUODENOSCOPY  04/28/2012   Procedure: ESOPHAGOGASTRODUODENOSCOPY (EGD);  Surgeon: [name], MD;  [facility];  Service: Endoscopy;  Laterality: N/A;  140-moved to 10:30 Ann notified pt  . EYE SURGERY    . TONSILLECTOMY    . TOTAL HIP REVISION    . UPPER GASTROINTESTINAL ENDOSCOPY  10/03/2010   EGD DILATN PYLORIC CHANNEL  . UPPER GASTROINTESTINAL ENDOSCOPY  07/08/2010  . UPPER GASTROINTESTINAL ENDOSCOPY  08/06/06   FIELDS  . UPPER GASTROINTESTINAL ENDOSCOPY  06/29/2008   EGD    Current Outpatient Medications  Medication Sig Dispense Refill  . acetaminophen (TYLENOL) 500 MG tablet Take 500 mg by mouth as needed.    . diltiazem (CARDIZEM) 120 MG tablet Take 120 mg by mouth 2 (two) times daily.     . empagliflozin (JARDIANCE) 10 MG TABS tablet Take 10 mg by mouth daily.    Marland Kitchen escitalopram (LEXAPRO) 10 MG tablet Take 10 mg by mouth daily.     Marland Kitchen etanercept (ENBREL) 50 MG/ML injection Inject 50 mg into the skin once a week.    . folic acid (FOLVITE) 1 MG tablet Take 1 mg by mouth daily.    Marland Kitchen gabapentin (NEURONTIN) 300 MG capsule TAKE (1) CAPSULE BY MOUTH TWICE DAILY. 60 capsule 0  . hydroxychloroquine (PLAQUENIL) 200 MG tablet Take 200 mg by mouth 2 (  two) times daily.     . insulin detemir (LEVEMIR) 100 UNIT/ML injection Take 20 units at night 15 mL 2  . isosorbide mononitrate (IMDUR) 30 MG 24 hr tablet TAKE 1 TABLET BY MOUTH ONCE DAILY. 90 tablet 3  . levothyroxine (SYNTHROID, LEVOTHROID) 50 MCG tablet Take 50 mcg by mouth daily.      . methotrexate 2.5 MG tablet Take 2.5 mg by mouth once a week. 10 tablets on Wednesdays    . metoprolol tartrate (LOPRESSOR) 50 MG tablet TAKE (1) TABLET BY MOUTH TWICE DAILY. 180 tablet 0  . nitroGLYCERIN (NITROSTAT) 0.4 MG SL tablet Place 1 tablet (0.4 mg total) under the tongue every 5 (five) minutes as needed for chest pain. 25 tablet 3  . OVER THE  COUNTER MEDICATION OTC Antacid Magnesia/ Simethicone Oral Suspension The patient states that she takes 1 Tablespoon as needed    . pantoprazole (PROTONIX) 40 MG tablet TAKE 1 TABLET BY MOUTH ONCE DAILY. 90 tablet 0  . pravastatin (PRAVACHOL) 20 MG tablet Take 20 mg by mouth daily.     Marland Kitchen Propylene Glycol (SYSTANE BALANCE OP) Apply 2 drops to eye daily as needed. FOR DRY EYES      . pseudoephedrine-guaifenesin (MUCINEX D) 60-600 MG per tablet Take 1 tablet by mouth as needed.    . SURE COMFORT PEN NEEDLES 31G X 8 MM MISC USE AS DIRECTED DAILY. 100 each 0  . traZODone (DESYREL) 50 MG tablet TAKE (1) TABLET BY MOUTH AT BEDTIME. 30 tablet 0  . valsartan (DIOVAN) 160 MG tablet Take 160 mg by mouth daily.     Marland Kitchen VITAMIN A PO Take 1 tablet by mouth daily.     No current facility-administered medications for this visit.   Allergies:  Shellfish allergy, Tussigon [hydrocodone-homatropine], Hydrocodone, Glyburide, Levofloxacin, Lisinopril, Morphine, and Pantoprazole sodium [pantoprazole sodium]   ROS:  No syncope.  Physical Exam: VS:  BP 120/74   Pulse 69   Ht 5' 4.5" (1.638 m)   Wt 163 lb (73.9 kg)   SpO2 98%   BMI 27.55 kg/m , BMI Body mass index is 27.55 kg/m.  Wt Readings from Last 3 Encounters:  03/01/20 163 lb (73.9 kg)  08/30/19 165 lb (74.8 kg)  05/10/19 171 lb (77.6 kg)    General: Patient appears comfortable at rest. HEENT: Conjunctiva and lids normal, wearing a mask. Neck: Supple, no elevated JVP or carotid bruits, no thyromegaly. Lungs: Clear to auscultation, nonlabored breathing at rest. Cardiac: Regular rate and rhythm, no S3, 2/6 systolic murmur, no pericardial rub. Extremities: No pitting edema.  ECG:  An ECG dated 08/30/2019 was personally reviewed today and demonstrated:  Sinus bradycardia.  Recent Labwork: 04/12/2019: ALT 22; AST 28; BUN 12; Creat 0.81; Potassium 3.9; Sodium 139; TSH 1.32     Component Value Date/Time   CHOL 115 04/16/2018 0000   TRIG 93 04/16/2018  0000   HDL 39 04/16/2018 0000   LDLCALC 58 04/16/2018 0000    Other Studies Reviewed Today:  Echocardiogram 09/09/2019: 1. Left ventricular ejection fraction, by estimation, is 60 to 65%. The  left ventricle has normal function. The left ventricle has no regional  wall motion abnormalities. There is mild left ventricular hypertrophy.  Left ventricular diastolic parameters  were normal.  2. Right ventricular systolic function is normal. The right ventricular  size is normal. There is normal pulmonary artery systolic pressure.  3. The mitral valve is normal in structure. Trivial mitral valve  regurgitation. No evidence of mitral stenosis.  4. The aortic valve is tricuspid. Aortic valve regurgitation is trivial.  Mild to moderate aortic valve sclerosis/calcification is present, without  any evidence of aortic stenosis.  5. The inferior vena cava is normal in size with greater than 50%  respiratory variability, suggesting right atrial pressure of 3 mmHg.   Assessment and Plan:  1.  Medically managed ischemic heart disease based on previous abnormal Myoview.  She does not describe any obvious angina symptoms and remains clinically stable on medical therapy including Lopressor, Cardizem CD, Imdur, Diovan, and Pravachol.  2.  Aortic valve sclerosis without stenosis.  Echocardiogram from June is noted above.  3.  Essential hypertension, blood pressure is well controlled today on medical therapy.  Medication Adjustments/Labs and Tests Ordered: Current medicines are reviewed at length with the patient today.  Concerns regarding medicines are outlined above.   Tests Ordered: No orders of the defined types were placed in this encounter.   Medication Changes: No orders of the defined types were placed in this encounter.   Disposition:  Follow up 6 months in the [place] office.  Signed, Satira Sark, MD, [facility] 03/01/2020 1:24 PM    [place] at  [facility] [address] [place], [place] [address] Phone: [phone]; Fax: [phone]

## 2020-03-01 ENCOUNTER — Encounter: Payer: Self-pay | Admitting: Cardiology

## 2020-03-01 ENCOUNTER — Other Ambulatory Visit: Payer: Self-pay

## 2020-03-01 ENCOUNTER — Ambulatory Visit: Payer: PPO | Admitting: Cardiology

## 2020-03-01 VITALS — BP 120/74 | HR 69 | Ht 64.5 in | Wt 163.0 lb

## 2020-03-01 DIAGNOSIS — I358 Other nonrheumatic aortic valve disorders: Secondary | ICD-10-CM

## 2020-03-01 DIAGNOSIS — I10 Essential (primary) hypertension: Secondary | ICD-10-CM | POA: Diagnosis not present

## 2020-03-01 DIAGNOSIS — R9439 Abnormal result of other cardiovascular function study: Secondary | ICD-10-CM

## 2020-03-01 NOTE — Patient Instructions (Signed)
Medication Instructions:  °Your physician recommends that you continue on your current medications as directed. Please refer to the Current Medication list given to you today. ° °*If you need a refill on your cardiac medications before your next appointment, please call your pharmacy* ° ° °Lab Work: °None today °If you have labs (blood work) drawn today and your tests are completely normal, you will receive your results only by: °• MyChart Message (if you have MyChart) OR °• A paper copy in the mail °If you have any lab test that is abnormal or we need to change your treatment, we will call you to review the results. ° ° °Testing/Procedures: °None today ° ° °Follow-Up: °At [facility], you and your health needs are our priority.  As part of our continuing mission to provide you with exceptional heart care, we have created designated Provider Care Teams.  These Care Teams include your primary Cardiologist (physician) and Advanced Practice Providers (APPs -  Physician Assistants and Nurse Practitioners) who all work together to provide you with the care you need, when you need it. ° °We recommend signing up for the patient portal called "MyChart".  Sign up information is provided on this After Visit Summary.  MyChart is used to connect with patients for Virtual Visits (Telemedicine).  Patients are able to view lab/test results, encounter notes, upcoming appointments, etc.  Non-urgent messages can be sent to your provider as well.   °To learn more about what you can do with MyChart, go to [email or web address].   ° °Your next appointment:   °6 month(s) ° °The format for your next appointment:   °In Person ° °Provider:   °Samuel McDowell, MD ° ° °Other Instructions °None ° ° ° ° °Thank you for choosing [facility] ! ° ° ° ° ° ° ° ° °

## 2020-03-14 DIAGNOSIS — W19XXXA Unspecified fall, initial encounter: Secondary | ICD-10-CM | POA: Diagnosis not present

## 2020-03-14 DIAGNOSIS — S300XXA Contusion of lower back and pelvis, initial encounter: Secondary | ICD-10-CM | POA: Diagnosis not present

## 2020-03-14 DIAGNOSIS — Z712 Person consulting for explanation of examination or test findings: Secondary | ICD-10-CM | POA: Diagnosis not present

## 2020-03-14 DIAGNOSIS — M533 Sacrococcygeal disorders, not elsewhere classified: Secondary | ICD-10-CM | POA: Diagnosis not present

## 2020-03-14 DIAGNOSIS — M549 Dorsalgia, unspecified: Secondary | ICD-10-CM | POA: Diagnosis not present

## 2020-03-14 DIAGNOSIS — Z0001 Encounter for general adult medical examination with abnormal findings: Secondary | ICD-10-CM | POA: Diagnosis not present

## 2020-03-25 ENCOUNTER — Other Ambulatory Visit: Payer: Self-pay | Admitting: Nurse Practitioner

## 2020-04-27 DIAGNOSIS — W19XXXA Unspecified fall, initial encounter: Secondary | ICD-10-CM | POA: Diagnosis not present

## 2020-04-27 DIAGNOSIS — Z0001 Encounter for general adult medical examination with abnormal findings: Secondary | ICD-10-CM | POA: Diagnosis not present

## 2020-04-27 DIAGNOSIS — M533 Sacrococcygeal disorders, not elsewhere classified: Secondary | ICD-10-CM | POA: Diagnosis not present

## 2020-04-27 DIAGNOSIS — M549 Dorsalgia, unspecified: Secondary | ICD-10-CM | POA: Diagnosis not present

## 2020-04-27 DIAGNOSIS — S300XXA Contusion of lower back and pelvis, initial encounter: Secondary | ICD-10-CM | POA: Diagnosis not present

## 2020-04-27 DIAGNOSIS — Z712 Person consulting for explanation of examination or test findings: Secondary | ICD-10-CM | POA: Diagnosis not present

## 2020-04-30 DIAGNOSIS — L4050 Arthropathic psoriasis, unspecified: Secondary | ICD-10-CM | POA: Diagnosis not present

## 2020-04-30 DIAGNOSIS — M150 Primary generalized (osteo)arthritis: Secondary | ICD-10-CM | POA: Diagnosis not present

## 2020-04-30 DIAGNOSIS — Z6827 Body mass index (BMI) 27.0-27.9, adult: Secondary | ICD-10-CM | POA: Diagnosis not present

## 2020-04-30 DIAGNOSIS — M3500 Sicca syndrome, unspecified: Secondary | ICD-10-CM | POA: Diagnosis not present

## 2020-04-30 DIAGNOSIS — M2550 Pain in unspecified joint: Secondary | ICD-10-CM | POA: Diagnosis not present

## 2020-04-30 DIAGNOSIS — Z79899 Other long term (current) drug therapy: Secondary | ICD-10-CM | POA: Diagnosis not present

## 2020-04-30 DIAGNOSIS — E663 Overweight: Secondary | ICD-10-CM | POA: Diagnosis not present

## 2020-05-02 ENCOUNTER — Other Ambulatory Visit: Payer: Self-pay | Admitting: Cardiology

## 2020-05-04 ENCOUNTER — Ambulatory Visit (INDEPENDENT_AMBULATORY_CARE_PROVIDER_SITE_OTHER): Payer: HMO | Admitting: Podiatry

## 2020-05-04 ENCOUNTER — Other Ambulatory Visit: Payer: Self-pay

## 2020-05-04 ENCOUNTER — Encounter: Payer: Self-pay | Admitting: Podiatry

## 2020-05-04 DIAGNOSIS — L603 Nail dystrophy: Secondary | ICD-10-CM

## 2020-05-04 DIAGNOSIS — E118 Type 2 diabetes mellitus with unspecified complications: Secondary | ICD-10-CM | POA: Diagnosis not present

## 2020-05-04 NOTE — Progress Notes (Signed)
This patient presents to the office with chief complaint of long thick nails and diabetic feet.  This patient  says there  is  no pain and discomfort in her feet.  This patient says there are long thick painful nail left big toe..  These nail is  painful walking and wearing shoes.  Patient has no history of infection or drainage from both feet.  Patient is unable to  self treat her own nails . This patient presents  to the office today for treatment of her  nails and a foot evaluation due to history of  diabetes.  General Appearance  Alert, conversant and in no acute stress.  Vascular  Dorsalis pedis and posterior tibial  pulses are palpable  bilaterally.  Capillary return is within normal limits  bilaterally. Temperature is within normal limits  bilaterally.  Neurologic  Senn-Weinstein monofilament wire test within normal limits  bilaterally. Muscle power within normal limits bilaterally.  Nails Thick disfigured discolored nails with subungual debris  Left hallux nail.. No evidence of bacterial infection or drainage bilaterally.  Orthopedic  No limitations of motion of motion feet .  No crepitus or effusions noted.  No bony pathology or digital deformities noted. HAV 1st MPJ  B/L.  Skin  normotropic skin with no porokeratosis noted bilaterally.  No signs of infections or ulcers noted.     Onychomycosis Left Hallux.  Diabetes with no foot complications  IE  Debride nails x 1.  A diabetic foot exam was performed and there is no evidence of any vascular or neurologic pathology.   RTC 1 year.   Gardiner Barefoot DPM

## 2020-05-10 DIAGNOSIS — L821 Other seborrheic keratosis: Secondary | ICD-10-CM | POA: Diagnosis not present

## 2020-05-10 DIAGNOSIS — L858 Other specified epidermal thickening: Secondary | ICD-10-CM | POA: Diagnosis not present

## 2020-05-17 ENCOUNTER — Other Ambulatory Visit: Payer: Self-pay | Admitting: Cardiology

## 2020-05-17 ENCOUNTER — Other Ambulatory Visit: Payer: Self-pay

## 2020-05-17 ENCOUNTER — Ambulatory Visit: Payer: HMO | Admitting: Cardiology

## 2020-05-17 ENCOUNTER — Other Ambulatory Visit (HOSPITAL_COMMUNITY)
Admission: RE | Admit: 2020-05-17 | Discharge: 2020-05-17 | Disposition: A | Discharge status: Home / Self Care | Payer: HMO | Source: Ambulatory Visit | Attending: Cardiology | Admitting: Cardiology

## 2020-05-17 ENCOUNTER — Encounter: Payer: Self-pay | Admitting: Cardiology

## 2020-05-17 VITALS — BP 132/64 | HR 70 | Ht 64.0 in | Wt 164.0 lb

## 2020-05-17 DIAGNOSIS — R0789 Other chest pain: Secondary | ICD-10-CM | POA: Diagnosis not present

## 2020-05-17 DIAGNOSIS — Z01818 Encounter for other preprocedural examination: Secondary | ICD-10-CM | POA: Insufficient documentation

## 2020-05-17 DIAGNOSIS — R9439 Abnormal result of other cardiovascular function study: Secondary | ICD-10-CM | POA: Diagnosis not present

## 2020-05-17 DIAGNOSIS — I200 Unstable angina: Secondary | ICD-10-CM

## 2020-05-17 LAB — BASIC METABOLIC PANEL
Anion gap: 5 (ref 5–15)
BUN: 8 mg/dL (ref 8–23)
CO2: 23 mmol/L (ref 22–32)
Calcium: 8.6 mg/dL — ABNORMAL LOW (ref 8.9–10.3)
Chloride: 104 mmol/L (ref 98–111)
Creatinine, Ser: 0.74 mg/dL (ref 0.44–1.00)
GFR, Estimated: >60 mL/min (ref >60)
Glucose, Bld: 108 mg/dL — ABNORMAL HIGH (ref 70–99)
Potassium: 4.3 mmol/L (ref 3.5–5.1)
Sodium: 132 mmol/L — ABNORMAL LOW (ref 135–145)

## 2020-05-17 LAB — CBC
HCT: 37.4 % (ref 36.0–46.0)
Hemoglobin: 12.8 g/dL (ref 12.0–15.0)
MCH: 33.9 pg (ref 26.0–34.0)
MCHC: 34.2 g/dL (ref 30.0–36.0)
MCV: 98.9 fL (ref 80.0–100.0)
Platelets: 142 10*3/uL — ABNORMAL LOW (ref 150–400)
RBC: 3.78 MIL/uL — ABNORMAL LOW (ref 3.87–5.11)
RDW: 13.4 % (ref 11.5–15.5)
WBC: 5.9 10*3/uL (ref 4.0–10.5)
nRBC: 0 % (ref 0.0–0.2)

## 2020-05-17 MED ORDER — SODIUM CHLORIDE 0.9% FLUSH: 3.0000 mL | Freq: Two times a day (BID) | INTRAVENOUS | Status: DC

## 2020-05-17 NOTE — Patient Instructions (Addendum)
    [place] [address] Dept: [phone] Loc: [phone]  Rebecca Hartman  05/17/2020  You are scheduled for a Cardiac Catheterization on Thursday, February 24 with Dr. Lauree Chandler.  1. Please arrive at the [facility] (Main Entrance A) at [facility]: [address] [place], [place] [address] at 7:00 AM (This time is two hours before your procedure to ensure your preparation). Free valet parking service is available.   Special note: Every effort is made to have your procedure done on time. Please understand that emergencies sometimes delay scheduled procedures.  2. Diet: Do not eat solid foods after midnight.  The patient may have clear liquids until 5am upon the day of the procedure.  3. Labs: You will need to have blood drawn today  at  [facility], go to Main Entrance to register.  COVID Test Tuesday 2/22 at [facility] site  4. Medication instructions in preparation for your procedure:   Contrast Allergy: No  HOLD Jardiance the morning of cath and for 2 days after.  On the morning of your procedure, take your Aspirin 81 mg and any morning medicines NOT listed above.  You may use sips of water.  5. Plan for one night stay--bring personal belongings. 6. Bring a current list of your medications and current insurance cards. 7. You MUST have a responsible person to drive you home. 8. Someone MUST be with you the first 24 hours after you arrive home or your discharge will be delayed. 9. Please wear clothes that are easy to get on and off and wear slip-on shoes.  Thank you for allowing [place] to care for you!   -- [facility]

## 2020-05-17 NOTE — Progress Notes (Signed)
Cardiology Office Note  Date: 05/17/2020   ID: Rebecca Hartman, DOB 08-20-1945, MRN [id number]  PCP:  Celene Squibb, MD  Cardiologist:  Rozann Lesches, MD Electrophysiologist:  None   Chief Complaint  Patient presents with  . Cardiac follow-up    History of Present Illness: Rebecca Hartman is a 74 y.o. female last seen in December 2021.  She is here with her husband for a follow-up visit.  She called to schedule a follow-up visit secondary to recurring episodes of chest discomfort radiating to the left shoulder and upper arm.  This has been going on intermittently over the last few weeks, prior to that she was not having any symptoms on medical therapy.  She has been using sublingual nitroglycerin during this time.  We have been managing her for ischemic heart disease based on previous abnormal Myoview in November 2015 indicating mid to apical anteroseptal ischemia.  We had discussed cardiac catheterization in the past, however with no progressive symptoms, she was comfortable with observation.  In light of the recent increasing symptoms however, we discussed risks and benefits of proceeding to a cardiac catheterization with eye toward revascularization, and she is in agreement to proceed.  She reports compliance with her medications as noted below.  I reviewed her recent lab work.  Past Medical History:  Diagnosis Date  . Abnormal myocardial perfusion study    November 2015, mid to apical anteroseptal ischemia   . Anemia   . Anxiety   . Arthritis   . Cataract   . Chronic back pain   . Essential hypertension   . Fibromyalgia   . Hiatal hernia   . History of blood transfusion   . Hypercholesteremia   . Hypothyroidism   . Osteoporosis   . Type 2 diabetes mellitus ([place])     Past Surgical History:  Procedure Laterality Date  . ABDOMINAL HYSTERECTOMY    . BACK SURGERY    . C-EYE SURGERY PROCEDURE    . CARPAL TUNNEL RELEASE    . CATARACT EXTRACTION    . CHOLECYSTECTOMY     . COLONOSCOPY  08/06/06 FIELDS  . DENTAL SURGERY  04/2015   reconstructive surgery lower  . ESOPHAGOGASTRODUODENOSCOPY  10/31/2010   Procedure: ESOPHAGOGASTRODUODENOSCOPY (EGD);  Surgeon: [name], MD;  [facility];  Service: Endoscopy;  Laterality: N/A;  . ESOPHAGOGASTRODUODENOSCOPY  03/05/2011   Procedure: ESOPHAGOGASTRODUODENOSCOPY (EGD);  Surgeon: [name], MD;  [facility];  Service: Endoscopy;  Laterality: N/A;  12:00  . ESOPHAGOGASTRODUODENOSCOPY  04/28/2012   Procedure: ESOPHAGOGASTRODUODENOSCOPY (EGD);  Surgeon: [name], MD;  [facility];  Service: Endoscopy;  Laterality: N/A;  140-moved to 10:30 Ann notified pt  . EYE SURGERY    . TONSILLECTOMY    . TOTAL HIP REVISION    . UPPER GASTROINTESTINAL ENDOSCOPY  10/03/2010   EGD DILATN PYLORIC CHANNEL  . UPPER GASTROINTESTINAL ENDOSCOPY  07/08/2010  . UPPER GASTROINTESTINAL ENDOSCOPY  08/06/06   FIELDS  . UPPER GASTROINTESTINAL ENDOSCOPY  06/29/2008   EGD    Current Outpatient Medications  Medication Sig Dispense Refill  . acetaminophen (TYLENOL) 500 MG tablet Take 500 mg by mouth as needed.    . diltiazem (CARDIZEM CD) 120 MG 24 hr capsule Take 120 mg by mouth 2 (two) times daily.    . empagliflozin (JARDIANCE) 10 MG TABS tablet Take 10 mg by mouth daily.    Marland Kitchen escitalopram (LEXAPRO) 10 MG tablet Take 10 mg by  mouth daily.     Marland Kitchen etanercept (ENBREL) 50 MG/ML injection Inject 50 mg into the skin once a week.    . fluconazole (DIFLUCAN) 100 MG tablet fluconazole 100 mg tablet    . folic acid (FOLVITE) 1 MG tablet Take 1 mg by mouth daily.    Marland Kitchen gabapentin (NEURONTIN) 300 MG capsule TAKE (1) CAPSULE BY MOUTH TWICE DAILY. 60 capsule 0  . hydroxychloroquine (PLAQUENIL) 200 MG tablet Take 200 mg by mouth 2 (two) times daily.     . isosorbide mononitrate (IMDUR) 30 MG 24 hr tablet TAKE 1 TABLET BY MOUTH ONCE DAILY. 90 tablet 3  . levothyroxine (SYNTHROID, LEVOTHROID) 50 MCG tablet  Take 50 mcg by mouth daily.    . methotrexate 2.5 MG tablet Take 2.5 mg by mouth once a week. 10 tablets on Wednesdays    . metoprolol tartrate (LOPRESSOR) 50 MG tablet TAKE (1) TABLET BY MOUTH TWICE DAILY. 180 tablet 0  . mupirocin ointment (BACTROBAN) 2 % mupirocin 2 % topical ointment    . nitroGLYCERIN (NITROSTAT) 0.4 MG SL tablet PLACE 1 TAB UNDER TONGUE EVERY 5 MIN IF NEEDED FOR CHEST PAIN. MAY USE 3 TIMES.NO RELIEF CALL 911. 25 tablet 1  . OVER THE COUNTER MEDICATION OTC Antacid Magnesia/ Simethicone Oral Suspension The patient states that she takes 1 Tablespoon as needed    . pantoprazole (PROTONIX) 40 MG tablet TAKE 1 TABLET BY MOUTH ONCE DAILY. 90 tablet 0  . pravastatin (PRAVACHOL) 20 MG tablet Take 20 mg by mouth daily.     Marland Kitchen Propylene Glycol (SYSTANE BALANCE OP) Apply 2 drops to eye daily as needed. FOR DRY EYES    . pseudoephedrine-guaifenesin (MUCINEX D) 60-600 MG per tablet Take 1 tablet by mouth as needed.    . temazepam (RESTORIL) 30 MG capsule Take 30 mg by mouth at bedtime.    . traZODone (DESYREL) 50 MG tablet TAKE (1) TABLET BY MOUTH AT BEDTIME. 30 tablet 0  . valsartan (DIOVAN) 160 MG tablet Take 160 mg by mouth daily.    Marland Kitchen VITAMIN A PO Take 1 tablet by mouth daily.     No current facility-administered medications for this visit.   Allergies:  Shellfish allergy, Tussigon [hydrocodone-homatropine], Hydrocodone, Glyburide, Levofloxacin, Levofloxacin, Lisinopril, Pantoprazole, Morphine, and Pantoprazole sodium [pantoprazole sodium]   Social History: The patient  reports that she has never smoked. She has never used smokeless tobacco. She reports that she does not drink alcohol and does not use drugs.   Family History: The patient's family history includes Diabetes in her mother; Healthy in her son; Heart disease in her father; Hyperlipidemia in her father and mother; Hypertension in her father and mother; Stroke in her father and mother.   ROS: No palpitations or  syncope.  Physical Exam: VS:  BP 132/64   Pulse 70   Ht 5\' 4"  (1.626 m)   Wt 164 lb (74.4 kg)   SpO2 98%   BMI 28.15 kg/m , BMI Body mass index is 28.15 kg/m.  Wt Readings from Last 3 Encounters:  05/17/20 164 lb (74.4 kg)  03/01/20 163 lb (73.9 kg)  08/30/19 165 lb (74.8 kg)    General: Patient appears comfortable at rest. HEENT: Conjunctiva and lids normal, wearing a mask. Neck: Supple, no elevated JVP or carotid bruits, no thyromegaly. Lungs: Clear to auscultation, nonlabored breathing at rest. Cardiac: Regular rate and rhythm, no S3, 2/6 systolic murmur, no pericardial rub. Abdomen: Soft, nontender, bowel sounds present. Extremities: No pitting edema, distal pulses  2+. Skin: Warm and dry. Musculoskeletal: No kyphosis. Neuropsychiatric: Alert and oriented x3, affect grossly appropriate.  ECG:  An ECG dated 08/30/2019 was personally reviewed today and demonstrated:  Sinus bradycardia.  Recent Labwork:    Component Value Date/Time   CHOL 115 04/16/2018 0000   TRIG 93 04/16/2018 0000   HDL 39 04/16/2018 0000   LDLCALC 58 04/16/2018 0000  January 2022: Hemoglobin 13.3, platelets 142, BUN 8, creatinine 0.92, potassium 4.2, AST 42, ALT 22, cholesterol 118, triglycerides 78, HDL 34, LDL 68, hemoglobin A1c 5.7%  Other Studies Reviewed Today:  Lexiscan Myoview 02/06/2014: IMPRESSION: 1. Study consistent with mid to apical anteroseptal ischemia as detailed above.  2. Normal left ventricular wall motion.  3. Left ventricular ejection fraction 72%  4. Intermediate-risk stress test findings*.  Echocardiogram 09/09/2019: 1. Left ventricular ejection fraction, by estimation, is 60 to 65%. The  left ventricle has normal function. The left ventricle has no regional  wall motion abnormalities. There is mild left ventricular hypertrophy.  Left ventricular diastolic parameters  were normal.  2. Right ventricular systolic function is normal. The right ventricular  size is  normal. There is normal pulmonary artery systolic pressure.  3. The mitral valve is normal in structure. Trivial mitral valve  regurgitation. No evidence of mitral stenosis.  4. The aortic valve is tricuspid. Aortic valve regurgitation is trivial.  Mild to moderate aortic valve sclerosis/calcification is present, without  any evidence of aortic stenosis.  5. The inferior vena cava is normal in size with greater than 50%  respiratory variability, suggesting right atrial pressure of 3 mmHg.   Assessment and Plan:  1.  Accelerating angina in the setting of suspected ischemic heart disease based on previous Myoview study in 2015 indicating mid apical anteroseptal ischemia.  She has done well over time on medical therapy and preference for conservative management.  In light of worsening symptoms however, we have discussed the risk and benefits of proceeding with a diagnostic cardiac catheterization with eye toward potential revascularization options.  She is in agreement to proceed.  Continue aspirin, Diovan, Pravachol, Lopressor, Imdur, and Cardizem CD.  2.  Mixed hyperlipidemia on Pravachol.  Recent LDL 68.  3.  Type 2 diabetes mellitus, she is on Jardiance along with Levemir per PCP.  Recent hemoglobin A1c 5.7%.  Medication Adjustments/Labs and Tests Ordered: Current medicines are reviewed at length with the patient today.  Concerns regarding medicines are outlined above.   Tests Ordered: Orders Placed This Encounter  Procedures  . CBC  . Basic metabolic panel  . EKG 12-Lead    Medication Changes: No orders of the defined types were placed in this encounter.   Disposition:  Follow up after procedure.  Signed, Satira Sark, MD, [facility] 05/17/2020 2:51 PM    [facility] at [facility] [address], [place], [place] [address] Phone: [phone]; Fax: [phone]

## 2020-05-17 NOTE — H&P (View-Only) (Signed)
Cardiology Office Note  Date: 05/17/2020   ID: Rebecca Hartman, DOB 21-Oct-1945, MRN [id number]  PCP:  Celene Squibb, MD  Cardiologist:  Rozann Lesches, MD Electrophysiologist:  None   Chief Complaint  Patient presents with  . Cardiac follow-up    History of Present Illness: Rebecca Hartman is a 75 y.o. female last seen in December 2021.  She is here with her husband for a follow-up visit.  She called to schedule a follow-up visit secondary to recurring episodes of chest discomfort radiating to the left shoulder and upper arm.  This has been going on intermittently over the last few weeks, prior to that she was not having any symptoms on medical therapy.  She has been using sublingual nitroglycerin during this time.  We have been managing her for ischemic heart disease based on previous abnormal Myoview in November 2015 indicating mid to apical anteroseptal ischemia.  We had discussed cardiac catheterization in the past, however with no progressive symptoms, she was comfortable with observation.  In light of the recent increasing symptoms however, we discussed risks and benefits of proceeding to a cardiac catheterization with eye toward revascularization, and she is in agreement to proceed.  She reports compliance with her medications as noted below.  I reviewed her recent lab work.  Past Medical History:  Diagnosis Date  . Abnormal myocardial perfusion study    November 2015, mid to apical anteroseptal ischemia   . Anemia   . Anxiety   . Arthritis   . Cataract   . Chronic back pain   . Essential hypertension   . Fibromyalgia   . Hiatal hernia   . History of blood transfusion   . Hypercholesteremia   . Hypothyroidism   . Osteoporosis   . Type 2 diabetes mellitus ([place])     Past Surgical History:  Procedure Laterality Date  . ABDOMINAL HYSTERECTOMY    . BACK SURGERY    . C-EYE SURGERY PROCEDURE    . CARPAL TUNNEL RELEASE    . CATARACT EXTRACTION    . CHOLECYSTECTOMY     . COLONOSCOPY  08/06/06 FIELDS  . DENTAL SURGERY  04/2015   reconstructive surgery lower  . ESOPHAGOGASTRODUODENOSCOPY  10/31/2010   Procedure: ESOPHAGOGASTRODUODENOSCOPY (EGD);  Surgeon: [name], MD;  [facility];  Service: Endoscopy;  Laterality: N/A;  . ESOPHAGOGASTRODUODENOSCOPY  03/05/2011   Procedure: ESOPHAGOGASTRODUODENOSCOPY (EGD);  Surgeon: [name], MD;  [facility];  Service: Endoscopy;  Laterality: N/A;  12:00  . ESOPHAGOGASTRODUODENOSCOPY  04/28/2012   Procedure: ESOPHAGOGASTRODUODENOSCOPY (EGD);  Surgeon: [name], MD;  [facility];  Service: Endoscopy;  Laterality: N/A;  140-moved to 10:30 Ann notified pt  . EYE SURGERY    . TONSILLECTOMY    . TOTAL HIP REVISION    . UPPER GASTROINTESTINAL ENDOSCOPY  10/03/2010   EGD DILATN PYLORIC CHANNEL  . UPPER GASTROINTESTINAL ENDOSCOPY  07/08/2010  . UPPER GASTROINTESTINAL ENDOSCOPY  08/06/06   FIELDS  . UPPER GASTROINTESTINAL ENDOSCOPY  06/29/2008   EGD    Current Outpatient Medications  Medication Sig Dispense Refill  . acetaminophen (TYLENOL) 500 MG tablet Take 500 mg by mouth as needed.    . diltiazem (CARDIZEM CD) 120 MG 24 hr capsule Take 120 mg by mouth 2 (two) times daily.    . empagliflozin (JARDIANCE) 10 MG TABS tablet Take 10 mg by mouth daily.    Marland Kitchen escitalopram (LEXAPRO) 10 MG tablet Take 10 mg by  mouth daily.     Marland Kitchen etanercept (ENBREL) 50 MG/ML injection Inject 50 mg into the skin once a week.    . fluconazole (DIFLUCAN) 100 MG tablet fluconazole 100 mg tablet    . folic acid (FOLVITE) 1 MG tablet Take 1 mg by mouth daily.    Marland Kitchen gabapentin (NEURONTIN) 300 MG capsule TAKE (1) CAPSULE BY MOUTH TWICE DAILY. 60 capsule 0  . hydroxychloroquine (PLAQUENIL) 200 MG tablet Take 200 mg by mouth 2 (two) times daily.     . isosorbide mononitrate (IMDUR) 30 MG 24 hr tablet TAKE 1 TABLET BY MOUTH ONCE DAILY. 90 tablet 3  . levothyroxine (SYNTHROID, LEVOTHROID) 50 MCG tablet  Take 50 mcg by mouth daily.    . methotrexate 2.5 MG tablet Take 2.5 mg by mouth once a week. 10 tablets on Wednesdays    . metoprolol tartrate (LOPRESSOR) 50 MG tablet TAKE (1) TABLET BY MOUTH TWICE DAILY. 180 tablet 0  . mupirocin ointment (BACTROBAN) 2 % mupirocin 2 % topical ointment    . nitroGLYCERIN (NITROSTAT) 0.4 MG SL tablet PLACE 1 TAB UNDER TONGUE EVERY 5 MIN IF NEEDED FOR CHEST PAIN. MAY USE 3 TIMES.NO RELIEF CALL 911. 25 tablet 1  . OVER THE COUNTER MEDICATION OTC Antacid Magnesia/ Simethicone Oral Suspension The patient states that she takes 1 Tablespoon as needed    . pantoprazole (PROTONIX) 40 MG tablet TAKE 1 TABLET BY MOUTH ONCE DAILY. 90 tablet 0  . pravastatin (PRAVACHOL) 20 MG tablet Take 20 mg by mouth daily.     Marland Kitchen Propylene Glycol (SYSTANE BALANCE OP) Apply 2 drops to eye daily as needed. FOR DRY EYES    . pseudoephedrine-guaifenesin (MUCINEX D) 60-600 MG per tablet Take 1 tablet by mouth as needed.    . temazepam (RESTORIL) 30 MG capsule Take 30 mg by mouth at bedtime.    . traZODone (DESYREL) 50 MG tablet TAKE (1) TABLET BY MOUTH AT BEDTIME. 30 tablet 0  . valsartan (DIOVAN) 160 MG tablet Take 160 mg by mouth daily.    Marland Kitchen VITAMIN A PO Take 1 tablet by mouth daily.     No current facility-administered medications for this visit.   Allergies:  Shellfish allergy, Tussigon [hydrocodone-homatropine], Hydrocodone, Glyburide, Levofloxacin, Levofloxacin, Lisinopril, Pantoprazole, Morphine, and Pantoprazole sodium [pantoprazole sodium]   Social History: The patient  reports that she has never smoked. She has never used smokeless tobacco. She reports that she does not drink alcohol and does not use drugs.   Family History: The patient's family history includes Diabetes in her mother; Healthy in her son; Heart disease in her father; Hyperlipidemia in her father and mother; Hypertension in her father and mother; Stroke in her father and mother.   ROS: No palpitations or  syncope.  Physical Exam: VS:  BP 132/64   Pulse 70   Ht 5\' 4"  (1.626 m)   Wt 164 lb (74.4 kg)   SpO2 98%   BMI 28.15 kg/m , BMI Body mass index is 28.15 kg/m.  Wt Readings from Last 3 Encounters:  05/17/20 164 lb (74.4 kg)  03/01/20 163 lb (73.9 kg)  08/30/19 165 lb (74.8 kg)    General: Patient appears comfortable at rest. HEENT: Conjunctiva and lids normal, wearing a mask. Neck: Supple, no elevated JVP or carotid bruits, no thyromegaly. Lungs: Clear to auscultation, nonlabored breathing at rest. Cardiac: Regular rate and rhythm, no S3, 2/6 systolic murmur, no pericardial rub. Abdomen: Soft, nontender, bowel sounds present. Extremities: No pitting edema, distal pulses  2+. Skin: Warm and dry. Musculoskeletal: No kyphosis. Neuropsychiatric: Alert and oriented x3, affect grossly appropriate.  ECG:  An ECG dated 08/30/2019 was personally reviewed today and demonstrated:  Sinus bradycardia.  Recent Labwork:    Component Value Date/Time   CHOL 115 04/16/2018 0000   TRIG 93 04/16/2018 0000   HDL 39 04/16/2018 0000   LDLCALC 58 04/16/2018 0000  January 2022: Hemoglobin 13.3, platelets 142, BUN 8, creatinine 0.92, potassium 4.2, AST 42, ALT 22, cholesterol 118, triglycerides 78, HDL 34, LDL 68, hemoglobin A1c 5.7%  Other Studies Reviewed Today:  Lexiscan Myoview 02/06/2014: IMPRESSION: 1. Study consistent with mid to apical anteroseptal ischemia as detailed above.  2. Normal left ventricular wall motion.  3. Left ventricular ejection fraction 72%  4. Intermediate-risk stress test findings*.  Echocardiogram 09/09/2019: 1. Left ventricular ejection fraction, by estimation, is 60 to 65%. The  left ventricle has normal function. The left ventricle has no regional  wall motion abnormalities. There is mild left ventricular hypertrophy.  Left ventricular diastolic parameters  were normal.  2. Right ventricular systolic function is normal. The right ventricular  size is  normal. There is normal pulmonary artery systolic pressure.  3. The mitral valve is normal in structure. Trivial mitral valve  regurgitation. No evidence of mitral stenosis.  4. The aortic valve is tricuspid. Aortic valve regurgitation is trivial.  Mild to moderate aortic valve sclerosis/calcification is present, without  any evidence of aortic stenosis.  5. The inferior vena cava is normal in size with greater than 50%  respiratory variability, suggesting right atrial pressure of 3 mmHg.   Assessment and Plan:  1.  Accelerating angina in the setting of suspected ischemic heart disease based on previous Myoview study in 2015 indicating mid apical anteroseptal ischemia.  She has done well over time on medical therapy and preference for conservative management.  In light of worsening symptoms however, we have discussed the risk and benefits of proceeding with a diagnostic cardiac catheterization with eye toward potential revascularization options.  She is in agreement to proceed.  Continue aspirin, Diovan, Pravachol, Lopressor, Imdur, and Cardizem CD.  2.  Mixed hyperlipidemia on Pravachol.  Recent LDL 68.  3.  Type 2 diabetes mellitus, she is on Jardiance along with Levemir per PCP.  Recent hemoglobin A1c 5.7%.  Medication Adjustments/Labs and Tests Ordered: Current medicines are reviewed at length with the patient today.  Concerns regarding medicines are outlined above.   Tests Ordered: Orders Placed This Encounter  Procedures  . CBC  . Basic metabolic panel  . EKG 12-Lead    Medication Changes: No orders of the defined types were placed in this encounter.   Disposition:  Follow up after procedure.  Signed, Satira Sark, MD, [facility] 05/17/2020 2:51 PM    [facility] [address], [place], [place] [address] Phone: [phone]; Fax: [phone]

## 2020-05-22 ENCOUNTER — Other Ambulatory Visit (HOSPITAL_COMMUNITY)
Admission: RE | Admit: 2020-05-22 | Discharge: 2020-05-22 | Disposition: A | Discharge status: Home / Self Care | Payer: HMO | Source: Ambulatory Visit | Attending: Cardiovascular Disease | Admitting: Cardiovascular Disease

## 2020-05-22 DIAGNOSIS — Z20822 Contact with and (suspected) exposure to covid-19: Secondary | ICD-10-CM | POA: Insufficient documentation

## 2020-05-22 DIAGNOSIS — Z01812 Encounter for preprocedural laboratory examination: Secondary | ICD-10-CM | POA: Diagnosis not present

## 2020-05-22 LAB — SARS CORONAVIRUS 2 (TAT 6-24 HRS): SARS Coronavirus 2: NEGATIVE

## 2020-05-23 ENCOUNTER — Telehealth: Payer: Self-pay | Admitting: *Deleted

## 2020-05-23 NOTE — Telephone Encounter (Addendum)
Pt contacted pre-catheterization scheduled at [facility] for: Thursday May 24, 2020 9 AM Verified arrival time and place: [facility]) at: 7 AM   No solid food after midnight prior to cath, clear liquids until 5 AM day of procedure.  Hold: Jardiance -AM of procedure   Except hold medications AM meds can be  taken pre-cath with sips of water including: ASA 81 mg   Confirmed patient has responsible adult to drive home post procedure and be with patient first 24 hours after arriving home: yes  You are allowed ONE visitor in the waiting room during the time you are at the hospital for your procedure. Both you and your visitor must wear a mask once you enter the hospital.    Reviewed procedure/mask/visitor instructions with patient.    Patient reports eating shellfish causes severe nausea, to her knowledge she does not have allergy to IV contrast.

## 2020-05-24 ENCOUNTER — Ambulatory Visit (HOSPITAL_BASED_OUTPATIENT_CLINIC_OR_DEPARTMENT_OTHER): Payer: HMO

## 2020-05-24 ENCOUNTER — Ambulatory Visit (HOSPITAL_COMMUNITY)
Admission: RE | Disposition: A | Discharge status: Home / Self Care | Payer: Self-pay | Source: Home / Self Care | Attending: Cardiovascular Disease

## 2020-05-24 ENCOUNTER — Encounter (HOSPITAL_COMMUNITY): Payer: Self-pay | Admitting: Cardiovascular Disease

## 2020-05-24 ENCOUNTER — Other Ambulatory Visit: Payer: Self-pay

## 2020-05-24 ENCOUNTER — Ambulatory Visit (HOSPITAL_COMMUNITY)
Admission: RE | Admit: 2020-05-24 | Discharge: 2020-05-25 | Disposition: A | Discharge status: Home / Self Care | Payer: HMO | Attending: Cardiovascular Disease | Admitting: Cardiovascular Disease

## 2020-05-24 DIAGNOSIS — I25110 Atherosclerotic heart disease of native coronary artery with unstable angina pectoris: Secondary | ICD-10-CM | POA: Diagnosis not present

## 2020-05-24 DIAGNOSIS — Z955 Presence of coronary angioplasty implant and graft: Secondary | ICD-10-CM | POA: Diagnosis not present

## 2020-05-24 DIAGNOSIS — M797 Fibromyalgia: Secondary | ICD-10-CM | POA: Diagnosis not present

## 2020-05-24 DIAGNOSIS — Z794 Long term (current) use of insulin: Secondary | ICD-10-CM | POA: Diagnosis not present

## 2020-05-24 DIAGNOSIS — E785 Hyperlipidemia, unspecified: Secondary | ICD-10-CM | POA: Diagnosis not present

## 2020-05-24 DIAGNOSIS — E782 Mixed hyperlipidemia: Secondary | ICD-10-CM | POA: Diagnosis not present

## 2020-05-24 DIAGNOSIS — Z888 Allergy status to other drugs, medicaments and biological substances status: Secondary | ICD-10-CM | POA: Diagnosis not present

## 2020-05-24 DIAGNOSIS — E119 Type 2 diabetes mellitus without complications: Secondary | ICD-10-CM | POA: Diagnosis not present

## 2020-05-24 DIAGNOSIS — I724 Aneurysm of artery of lower extremity: Secondary | ICD-10-CM | POA: Diagnosis not present

## 2020-05-24 DIAGNOSIS — D509 Iron deficiency anemia, unspecified: Secondary | ICD-10-CM | POA: Diagnosis not present

## 2020-05-24 DIAGNOSIS — I97630 Postprocedural hematoma of a circulatory system organ or structure following a cardiac catheterization: Secondary | ICD-10-CM | POA: Diagnosis not present

## 2020-05-24 DIAGNOSIS — Z885 Allergy status to narcotic agent status: Secondary | ICD-10-CM | POA: Diagnosis not present

## 2020-05-24 DIAGNOSIS — I200 Unstable angina: Secondary | ICD-10-CM

## 2020-05-24 DIAGNOSIS — I10 Essential (primary) hypertension: Secondary | ICD-10-CM | POA: Diagnosis not present

## 2020-05-24 DIAGNOSIS — Z79899 Other long term (current) drug therapy: Secondary | ICD-10-CM | POA: Insufficient documentation

## 2020-05-24 DIAGNOSIS — Z881 Allergy status to other antibiotic agents status: Secondary | ICD-10-CM | POA: Insufficient documentation

## 2020-05-24 DIAGNOSIS — Z91013 Allergy to seafood: Secondary | ICD-10-CM | POA: Diagnosis not present

## 2020-05-24 DIAGNOSIS — Z79890 Hormone replacement therapy (postmenopausal): Secondary | ICD-10-CM | POA: Diagnosis not present

## 2020-05-24 DIAGNOSIS — E039 Hypothyroidism, unspecified: Secondary | ICD-10-CM | POA: Diagnosis not present

## 2020-05-24 DIAGNOSIS — E118 Type 2 diabetes mellitus with unspecified complications: Secondary | ICD-10-CM | POA: Diagnosis present

## 2020-05-24 HISTORY — PX: CORONARY STENT INTERVENTION: CATH118234

## 2020-05-24 HISTORY — DX: Atherosclerotic heart disease of native coronary artery without angina pectoris: I25.10

## 2020-05-24 HISTORY — PX: INTRAVASCULAR IMAGING/OCT: CATH118326

## 2020-05-24 HISTORY — PX: LEFT HEART CATH AND CORONARY ANGIOGRAPHY: CATH118249

## 2020-05-24 HISTORY — PX: INTRAVASCULAR PRESSURE WIRE/FFR STUDY: CATH118243

## 2020-05-24 LAB — POCT ACTIVATED CLOTTING TIME
Activated Clotting Time: 297 seconds
Activated Clotting Time: 410 seconds
Activated Clotting Time: 291 seconds
Activated Clotting Time: 231 seconds
Activated Clotting Time: 196 seconds
Activated Clotting Time: 190 seconds

## 2020-05-24 LAB — GLUCOSE, CAPILLARY
Glucose-Capillary: 148 mg/dL — ABNORMAL HIGH (ref 70–99)
Glucose-Capillary: 109 mg/dL — ABNORMAL HIGH (ref 70–99)
Glucose-Capillary: 107 mg/dL — ABNORMAL HIGH (ref 70–99)
Glucose-Capillary: 138 mg/dL — ABNORMAL HIGH (ref 70–99)
Glucose-Capillary: 165 mg/dL — ABNORMAL HIGH (ref 70–99)

## 2020-05-24 SURGERY — LEFT HEART CATH AND CORONARY ANGIOGRAPHY
Anesthesia: LOCAL

## 2020-05-24 MED ORDER — TEMAZEPAM 15 MG PO CAPS
30.0000 mg | ORAL_CAPSULE | Freq: Every day | ORAL | Status: DC
  Administered 2020-05-24: 30 mg via ORAL
  Filled 2020-05-24: qty 2

## 2020-05-24 MED ORDER — HYDRALAZINE HCL 20 MG/ML IJ SOLN: 10.0000 mg | INTRAMUSCULAR | Status: AC | PRN

## 2020-05-24 MED ORDER — NITROGLYCERIN 0.4 MG SL SUBL: 0.4000 mg | SUBLINGUAL_TABLET | SUBLINGUAL | Status: DC | PRN

## 2020-05-24 MED ORDER — ESCITALOPRAM OXALATE 10 MG PO TABS: 10.0000 mg | ORAL_TABLET | Freq: Every evening | ORAL | Status: DC

## 2020-05-24 MED ORDER — ONDANSETRON HCL 4 MG/2ML IJ SOLN
INTRAMUSCULAR | Status: AC
  Filled 2020-05-24: qty 2

## 2020-05-24 MED ORDER — HEPARIN (PORCINE) IN NACL 1000-0.9 UT/500ML-% IV SOLN
INTRAVENOUS | Status: AC
  Filled 2020-05-24: qty 500

## 2020-05-24 MED ORDER — EMPAGLIFLOZIN 10 MG PO TABS
10.0000 mg | ORAL_TABLET | Freq: Every day | ORAL | Status: DC
  Filled 2020-05-24: qty 1

## 2020-05-24 MED ORDER — FENTANYL CITRATE (PF) 100 MCG/2ML IJ SOLN
INTRAMUSCULAR | Status: AC
  Filled 2020-05-24: qty 2

## 2020-05-24 MED ORDER — CLOPIDOGREL BISULFATE 300 MG PO TABS
ORAL_TABLET | ORAL | Status: DC | PRN
  Administered 2020-05-24: 600 mg via ORAL

## 2020-05-24 MED ORDER — HEPARIN SODIUM (PORCINE) 1000 UNIT/ML IJ SOLN
INTRAMUSCULAR | Status: DC | PRN
  Administered 2020-05-24: 3000 [IU] via INTRAVENOUS
  Administered 2020-05-24: 10000 [IU] via INTRAVENOUS

## 2020-05-24 MED ORDER — CLOPIDOGREL BISULFATE 75 MG PO TABS
75.0000 mg | ORAL_TABLET | Freq: Every day | ORAL | Status: DC
  Administered 2020-05-25: 75 mg via ORAL
  Filled 2020-05-24: qty 1

## 2020-05-24 MED ORDER — SODIUM CHLORIDE 0.9 % IV SOLN: 250.0000 mL | INTRAVENOUS | Status: DC | PRN

## 2020-05-24 MED ORDER — SODIUM CHLORIDE 0.9 % WEIGHT BASED INFUSION
1.0000 mL/kg/h | INTRAVENOUS | Status: DC
  Administered 2020-05-24: 1 mL/kg/h via INTRAVENOUS

## 2020-05-24 MED ORDER — NITROGLYCERIN 1 MG/10 ML FOR IR/CATH LAB
INTRA_ARTERIAL | Status: AC
  Filled 2020-05-24: qty 10

## 2020-05-24 MED ORDER — SODIUM CHLORIDE 0.9% FLUSH: 3.0000 mL | INTRAVENOUS | Status: DC | PRN

## 2020-05-24 MED ORDER — SODIUM CHLORIDE 0.9 % WEIGHT BASED INFUSION
3.0000 mL/kg/h | INTRAVENOUS | Status: DC
  Administered 2020-05-24: 3 mL/kg/h via INTRAVENOUS

## 2020-05-24 MED ORDER — LIDOCAINE HCL (PF) 1 % IJ SOLN
INTRAMUSCULAR | Status: DC | PRN
  Administered 2020-05-24: 15 mL
  Administered 2020-05-24: 2 mL

## 2020-05-24 MED ORDER — VERAPAMIL HCL 2.5 MG/ML IV SOLN
INTRAVENOUS | Status: AC
  Filled 2020-05-24: qty 2

## 2020-05-24 MED ORDER — ACETAMINOPHEN 325 MG PO TABS
650.0000 mg | ORAL_TABLET | ORAL | Status: DC | PRN
  Administered 2020-05-24 – 2020-05-25 (×3): 650 mg via ORAL
  Filled 2020-05-24 (×3): qty 2

## 2020-05-24 MED ORDER — CLOPIDOGREL BISULFATE 300 MG PO TABS
ORAL_TABLET | ORAL | Status: AC
  Filled 2020-05-24: qty 1

## 2020-05-24 MED ORDER — IRBESARTAN 150 MG PO TABS
150.0000 mg | ORAL_TABLET | Freq: Every day | ORAL | Status: DC
  Administered 2020-05-25: 150 mg via ORAL
  Filled 2020-05-24: qty 1

## 2020-05-24 MED ORDER — SODIUM CHLORIDE 0.9 % IV SOLN: INTRAVENOUS | Status: AC

## 2020-05-24 MED ORDER — ATORVASTATIN CALCIUM 40 MG PO TABS
40.0000 mg | ORAL_TABLET | Freq: Every day | ORAL | Status: DC
  Administered 2020-05-25: 40 mg via ORAL
  Filled 2020-05-24: qty 1

## 2020-05-24 MED ORDER — IOHEXOL 350 MG/ML SOLN
INTRAVENOUS | Status: DC | PRN
  Administered 2020-05-24: 195 mL via INTRA_ARTERIAL

## 2020-05-24 MED ORDER — ISOSORBIDE MONONITRATE ER 30 MG PO TB24
30.0000 mg | ORAL_TABLET | Freq: Every evening | ORAL | Status: DC
  Administered 2020-05-24: 30 mg via ORAL
  Filled 2020-05-24: qty 1

## 2020-05-24 MED ORDER — ASPIRIN 81 MG PO CHEW: 81.0000 mg | CHEWABLE_TABLET | ORAL | Status: DC

## 2020-05-24 MED ORDER — HEPARIN (PORCINE) IN NACL 1000-0.9 UT/500ML-% IV SOLN
INTRAVENOUS | Status: DC | PRN
  Administered 2020-05-24 (×2): 500 mL

## 2020-05-24 MED ORDER — LEVOTHYROXINE SODIUM 50 MCG PO TABS
50.0000 ug | ORAL_TABLET | Freq: Every day | ORAL | Status: DC
  Administered 2020-05-25: 50 ug via ORAL
  Filled 2020-05-24: qty 1

## 2020-05-24 MED ORDER — GABAPENTIN 300 MG PO CAPS
300.0000 mg | ORAL_CAPSULE | Freq: Every day | ORAL | Status: DC
  Administered 2020-05-25: 300 mg via ORAL
  Filled 2020-05-24: qty 1

## 2020-05-24 MED ORDER — HYDRALAZINE HCL 20 MG/ML IJ SOLN
INTRAMUSCULAR | Status: AC
  Filled 2020-05-24: qty 1

## 2020-05-24 MED ORDER — MIDAZOLAM HCL 2 MG/2ML IJ SOLN
INTRAMUSCULAR | Status: AC
  Filled 2020-05-24: qty 2

## 2020-05-24 MED ORDER — ONDANSETRON HCL 4 MG/2ML IJ SOLN: 4.0000 mg | Freq: Four times a day (QID) | INTRAMUSCULAR | Status: DC | PRN

## 2020-05-24 MED ORDER — PANTOPRAZOLE SODIUM 40 MG PO TBEC
40.0000 mg | DELAYED_RELEASE_TABLET | Freq: Every day | ORAL | Status: DC
  Administered 2020-05-25: 40 mg via ORAL
  Filled 2020-05-24: qty 1

## 2020-05-24 MED ORDER — FENTANYL CITRATE (PF) 100 MCG/2ML IJ SOLN
INTRAMUSCULAR | Status: DC | PRN
  Administered 2020-05-24 (×2): 25 ug via INTRAVENOUS

## 2020-05-24 MED ORDER — MIDAZOLAM HCL 2 MG/2ML IJ SOLN
INTRAMUSCULAR | Status: DC | PRN
  Administered 2020-05-24 (×2): 1 mg via INTRAVENOUS

## 2020-05-24 MED ORDER — DILTIAZEM HCL ER COATED BEADS 120 MG PO CP24
120.0000 mg | ORAL_CAPSULE | Freq: Two times a day (BID) | ORAL | Status: DC
  Administered 2020-05-24 – 2020-05-25 (×2): 120 mg via ORAL
  Filled 2020-05-24 (×2): qty 1

## 2020-05-24 MED ORDER — HEPARIN SODIUM (PORCINE) 1000 UNIT/ML IJ SOLN
INTRAMUSCULAR | Status: AC
  Filled 2020-05-24: qty 1

## 2020-05-24 MED ORDER — ONDANSETRON HCL 4 MG/2ML IJ SOLN
INTRAMUSCULAR | Status: DC | PRN
  Administered 2020-05-24: 4 mg via INTRAVENOUS

## 2020-05-24 MED ORDER — LABETALOL HCL 5 MG/ML IV SOLN: 10.0000 mg | INTRAVENOUS | Status: AC | PRN

## 2020-05-24 MED ORDER — HYDROXYCHLOROQUINE SULFATE 200 MG PO TABS
200.0000 mg | ORAL_TABLET | Freq: Every evening | ORAL | Status: DC
  Administered 2020-05-24: 200 mg via ORAL
  Filled 2020-05-24: qty 1

## 2020-05-24 MED ORDER — ASPIRIN 81 MG PO CHEW
81.0000 mg | CHEWABLE_TABLET | Freq: Every day | ORAL | Status: DC
  Administered 2020-05-25: 81 mg via ORAL
  Filled 2020-05-24: qty 1

## 2020-05-24 MED ORDER — SODIUM CHLORIDE 0.9% FLUSH
3.0000 mL | Freq: Two times a day (BID) | INTRAVENOUS | Status: DC
  Administered 2020-05-24 – 2020-05-25 (×2): 3 mL via INTRAVENOUS

## 2020-05-24 MED ORDER — METOPROLOL TARTRATE 50 MG PO TABS
50.0000 mg | ORAL_TABLET | Freq: Two times a day (BID) | ORAL | Status: DC
  Administered 2020-05-24 – 2020-05-25 (×2): 50 mg via ORAL
  Filled 2020-05-24 (×2): qty 1

## 2020-05-24 MED ORDER — LIDOCAINE HCL (PF) 1 % IJ SOLN
INTRAMUSCULAR | Status: AC
  Filled 2020-05-24: qty 30

## 2020-05-24 MED ORDER — SODIUM CHLORIDE 0.9% FLUSH
3.0000 mL | INTRAVENOUS | Status: DC | PRN
Start: 2020-05-24 — End: 2020-05-25

## 2020-05-24 SURGICAL SUPPLY — 27 items
BAG SNAP BAND KOVER 36X36 (MISCELLANEOUS) ×1 IMPLANT
BALLN SAPPHIRE 2.5X12 (BALLOONS) ×2
BALLN SAPPHIRE ~~LOC~~ 3.25X15 (BALLOONS) ×1 IMPLANT
BALLOON SAPPHIRE 2.5X12 (BALLOONS) IMPLANT
CATH DRAGONFLY OPSTAR (CATHETERS) ×1 IMPLANT
CATH INFINITI 5 FR JL3.5 (CATHETERS) ×1 IMPLANT
CATH INFINITI 5FR AL1 (CATHETERS) ×1 IMPLANT
CATH INFINITI 5FR MULTPACK ANG (CATHETERS) ×1 IMPLANT
CATH LAUNCHER 5F EBU3.5 (CATHETERS) ×1 IMPLANT
CATH LAUNCHER 6FR JR4 (CATHETERS) ×1 IMPLANT
CATH VISTA GUIDE 6FR XBLAD3.5 (CATHETERS) ×1 IMPLANT
COVER DOME SNAP 22 D (MISCELLANEOUS) ×1 IMPLANT
GLIDESHEATH SLEND SS 6F .021 (SHEATH) ×1 IMPLANT
GUIDEWIRE INQWIRE 1.5J.035X260 (WIRE) IMPLANT
GUIDEWIRE PRESSURE X 175 (WIRE) ×1 IMPLANT
INQWIRE 1.5J .035X260CM (WIRE) ×2
KIT ENCORE 26 ADVANTAGE (KITS) ×1 IMPLANT
KIT HEART LEFT (KITS) ×2 IMPLANT
PACK CARDIAC CATHETERIZATION (CUSTOM PROCEDURE TRAY) ×2 IMPLANT
SHEATH PINNACLE 5F 10CM (SHEATH) ×1 IMPLANT
SHEATH PINNACLE 6F 10CM (SHEATH) ×1 IMPLANT
SHEATH PROBE COVER 6X72 (BAG) ×1 IMPLANT
STENT RESOLUTE ONYX 3.0X18 (Permanent Stent) ×1 IMPLANT
TRANSDUCER W/STOPCOCK (MISCELLANEOUS) ×2 IMPLANT
TUBING CIL FLEX 10 FLL-RA (TUBING) ×2 IMPLANT
WIRE COUGAR XT STRL 190CM (WIRE) ×1 IMPLANT
WIRE EMERALD 3MM-J .035X150CM (WIRE) ×1 IMPLANT

## 2020-05-24 NOTE — Progress Notes (Addendum)
SITE AREA: right groin/femoral  SITE PRIOR TO REMOVAL:  LEVEL 0  PRESSURE APPLIED FOR: approximately 25 minutes  MANUAL: YES  PATIENT STATUS DURING PULL: wnl  POST PULL SITE:  LEVEL 0, puffiness noted, bruising around insertion site present  POST PULL INSTRUCTIONS GIVEN: YES  POST PULL PULSES PRESENT: bilateral pedal pulses at +2  DRESSING APPLIED: gauze with tegaderm  BEDREST BEGINS @ 1605  COMMENTS:

## 2020-05-24 NOTE — Progress Notes (Signed)
Cardiology PM Note:   Called to bedside by nursing staff due to right groin hematoma post PCI today. Cath lab staff present holding pressure on the right groin. Groin soft with no expansion of the hematoma after I arrived. Vascular surgery was called. Appreciate Dr. Mora Appl assistance. Will arrange a vascular u/s tonight to exclude pseudoaneursym.   Pt stable currently with good BP. I updated her son by telephone. She will remain on bedrest for 4 hours.   Lauree Chandler 05/24/2020 6:41 PM

## 2020-05-24 NOTE — Consult Note (Signed)
ASSESSMENT & PLAN:  75 y.o. female with right groin hematoma after cardiac catheterization. Physical exam benign beyond expected ecchymosis. Duplex ultrasound shows no pseudoaneurysm, sizable collection, or AVF. Will plan to observe. OK for diet. Check CBC in AM.  CHIEF COMPLAINT:   Groin hematoma after cardiac catheterization  HISTORY:  HISTORY OF PRESENT ILLNESS: Rebecca Hartman is a 75 y.o. female underwent a percutaneous coronary intervention earlier today via right common femoral artery access.  Extensive pressure was held to the groin over the course of the afternoon.  Severe ecchymosis was noted to the groin. vascular surgery consultation was requested for concern for vascular injury.  On my evaluation, the patient is in no distress.  I joined the team holding pressure on the groin and examined the patient.  She is mentating well and has no complaints.  She just wants to eat.  Past Medical History:  Diagnosis Date  . Abnormal myocardial perfusion study    November 2015, mid to apical anteroseptal ischemia   . Anemia   . Anxiety   . Arthritis   . Cataract   . Chronic back pain   . Coronary artery disease   . Essential hypertension   . Fibromyalgia   . Hiatal hernia   . History of blood transfusion   . Hypercholesteremia   . Hypothyroidism   . Osteoporosis   . Type 2 diabetes mellitus ([place])     Past Surgical History:  Procedure Laterality Date  . ABDOMINAL HYSTERECTOMY    . BACK SURGERY    . C-EYE SURGERY PROCEDURE    . CARPAL TUNNEL RELEASE    . CATARACT EXTRACTION    . CHOLECYSTECTOMY    . COLONOSCOPY  08/06/06 FIELDS  . CORONARY STENT INTERVENTION  05/24/2020  . DENTAL SURGERY  04/2015   reconstructive surgery lower  . ESOPHAGOGASTRODUODENOSCOPY  10/31/2010   Procedure: ESOPHAGOGASTRODUODENOSCOPY (EGD);  Surgeon: [name], MD;  [facility];  Service: Endoscopy;  Laterality: N/A;  . ESOPHAGOGASTRODUODENOSCOPY  03/05/2011   Procedure:  ESOPHAGOGASTRODUODENOSCOPY (EGD);  Surgeon: [name], MD;  [facility];  Service: Endoscopy;  Laterality: N/A;  12:00  . ESOPHAGOGASTRODUODENOSCOPY  04/28/2012   Procedure: ESOPHAGOGASTRODUODENOSCOPY (EGD);  Surgeon: [name], MD;  [facility];  Service: Endoscopy;  Laterality: N/A;  140-moved to 10:30 Ann notified pt  . EYE SURGERY    . TONSILLECTOMY    . TOTAL HIP REVISION    . UPPER GASTROINTESTINAL ENDOSCOPY  10/03/2010   EGD DILATN PYLORIC CHANNEL  . UPPER GASTROINTESTINAL ENDOSCOPY  07/08/2010  . UPPER GASTROINTESTINAL ENDOSCOPY  08/06/06   FIELDS  . UPPER GASTROINTESTINAL ENDOSCOPY  06/29/2008   EGD    Family History  Problem Relation Age of Onset  . Diabetes Mother   . Stroke Mother   . Hyperlipidemia Mother   . Hypertension Mother   . Stroke Father   . Heart disease Father   . Hyperlipidemia Father   . Hypertension Father   . Healthy Son     Social History   Socioeconomic History  . Marital status: Married    Spouse name: Not on file  . Number of children: Not on file  . Years of education: Not on file  . Highest education level: Not on file  Occupational History  . Occupation: retired  Tobacco Use  . Smoking status: Never Smoker  . Smokeless tobacco: Never Used  Vaping Use  . Vaping Use: Never used  Substance  and Sexual Activity  . Alcohol use: No    Alcohol/week: 0.0 standard drinks  . Drug use: No  . Sexual activity: Yes    Birth control/protection: Post-menopausal  Other Topics Concern  . Not on file  Social History Narrative  . Not on file   Social Determinants of Health   Financial Resource Strain: Not on file  Food Insecurity: Not on file  Transportation Needs: Not on file  Physical Activity: Not on file  Stress: Not on file  Social Connections: Not on file  Intimate Partner Violence: Not on file    Allergies  Allergen Reactions  . Shellfish Allergy Itching and Nausea And Vomiting  . Tussigon  [Hydrocodone-Homatropine]     Per patient's husband , this medication caused confusion.  . Hydrocodone Itching  . Glyburide     Sudden Drop in Blood Sugars  . Levofloxacin Itching  . Lisinopril Other (See Comments)    SEVERE DROP IN PRESSURE  . Morphine Itching and Nausea Only  . Pantoprazole Sodium [Pantoprazole Sodium] Nausea And Vomiting    PATIENT IS UNSURE OF REACTION    Current Facility-Administered Medications  Medication Dose Route Frequency Provider Last Rate Last Admin  . 0.9 %  sodium chloride infusion  250 mL Intravenous PRN Burnell Blanks, MD      . acetaminophen (TYLENOL) tablet 650 mg  650 mg Oral Q4H PRN Burnell Blanks, MD   650 mg at 05/24/20 1420  . [START ON 05/25/2020] aspirin chewable tablet 81 mg  81 mg Oral Daily Burnell Blanks, MD      . atorvastatin (LIPITOR) tablet 40 mg  40 mg Oral Daily Burnell Blanks, MD      . Derrill Memo ON 05/25/2020] clopidogrel (PLAVIX) tablet 75 mg  75 mg Oral Q breakfast Burnell Blanks, MD      . diltiazem (CARDIZEM CD) 24 hr capsule 120 mg  120 mg Oral BID Burnell Blanks, MD      . Derrill Memo ON 05/25/2020] empagliflozin (JARDIANCE) tablet 10 mg  10 mg Oral Daily Burnell Blanks, MD      . Derrill Memo ON 05/25/2020] escitalopram (LEXAPRO) tablet 10 mg  10 mg Oral QPM Burnell Blanks, MD      . Derrill Memo ON 05/25/2020] gabapentin (NEURONTIN) capsule 300 mg  300 mg Oral Daily Burnell Blanks, MD      . hydroxychloroquine (PLAQUENIL) tablet 200 mg  200 mg Oral QPM Burnell Blanks, MD      . Derrill Memo ON 05/25/2020] irbesartan (AVAPRO) tablet 150 mg  150 mg Oral Daily Burnell Blanks, MD      . isosorbide mononitrate (IMDUR) 24 hr tablet 30 mg  30 mg Oral QPM Burnell Blanks, MD      . Derrill Memo ON 05/25/2020] levothyroxine (SYNTHROID) tablet 50 mcg  50 mcg Oral Daily Burnell Blanks, MD      . metoprolol tartrate (LOPRESSOR) tablet 50 mg  50 mg Oral BID  Burnell Blanks, MD      . nitroGLYCERIN (NITROSTAT) SL tablet 0.4 mg  0.4 mg Sublingual Q5 min PRN Burnell Blanks, MD      . ondansetron [facility]) injection 4 mg  4 mg Intravenous Q6H PRN Burnell Blanks, MD      . Derrill Memo ON 05/25/2020] pantoprazole (PROTONIX) EC tablet 40 mg  40 mg Oral Daily Lauree Chandler D, MD      . sodium chloride flush (NS) 0.9 % injection 3 mL  3 mL Intravenous Q12H Burnell Blanks, MD      . sodium chloride flush (NS) 0.9 % injection 3 mL  3 mL Intravenous PRN Burnell Blanks, MD      . temazepam (RESTORIL) capsule 30 mg  30 mg Oral QHS Burnell Blanks, MD        REVIEW OF SYSTEMS:  [X]  denotes positive finding, [ ]  denotes negative finding Cardiac  Comments:  Chest pain or chest pressure:    Shortness of breath upon exertion:    Short of breath when lying flat:    Irregular heart rhythm:        Vascular    Pain in calf, thigh, or hip brought on by ambulation:    Pain in feet at night that wakes you up from your sleep:     Blood clot in your veins:    Leg swelling:         Pulmonary    Oxygen at home:    Productive cough:     Wheezing:         Neurologic    Sudden weakness in arms or legs:     Sudden numbness in arms or legs:     Sudden onset of difficulty speaking or slurred speech:    Temporary loss of vision in one eye:     Problems with dizziness:         Gastrointestinal    Blood in stool:     Vomited blood:         Genitourinary    Burning when urinating:     Blood in urine:        Psychiatric    Major depression:         Hematologic    Bleeding problems:    Problems with blood clotting too easily:        Skin    Rashes or ulcers:        Constitutional    Fever or chills:     PHYSICAL EXAM:   Vitals:   05/24/20 1845 05/24/20 1900 05/24/20 1915 05/24/20 1930  BP: (!) 141/53 (!) 146/44 (!) 147/54 (!) 147/59  Pulse: 62 66 65 64  Resp: 14 14 13 13   Temp:      TempSrc:       SpO2: 98% 98% 99% 98%  Weight:      Height:       Constitutional: well appearing elderly woman in no distress. Appears well nourished.  Neurologic: CN intact. No focal findings. No sensory loss. Psychiatric: Mood and affect symmetric and appropriate. Eyes: No icterus. No conjunctival pallor. Ears, nose, throat: mucous membranes moist. Midline trachea.  Cardiac: regular rate and rhythm.  Respiratory: unlabored. Abdominal: soft, non-tender, non-distended.  Peripheral vascular:  2+ R femoral pulse  No palpable fluid collection in groin  Extensive ecchymosis across the groin  No pulsatile mass noted in the groin Extremity: No edema. No cyanosis. No pallor.  Skin: No gangrene. No ulceration.  Lymphatic: No Stemmer's sign. No palpable lymphadenopathy.  DATA REVIEW:    Most recent CBC CBC Latest Ref Rng & Units 05/17/2020 04/16/2018 01/12/2018  WBC 4.0 - 10.5 K/uL 5.9 6.1 7.4  Hemoglobin 12.0 - 15.0 g/dL 12.8 12.9 12.3  Hematocrit 36.0 - 46.0 % 37.4 38 37  Platelets 150 - 400 K/uL 142(L) 191 193     Most recent CMP CMP Latest Ref Rng & Units 05/17/2020 04/12/2019 04/16/2018  Glucose 70 - 99 mg/dL 108(H) 97 -  BUN 8 - 23 mg/dL 8 12 10   Creatinine 0.44 - 1.00 mg/dL 0.74 0.81 0.8  Sodium 135 - 145 mmol/L 132(L) 139 -  Potassium 3.5 - 5.1 mmol/L 4.3 3.9 -  Chloride 98 - 111 mmol/L 104 105 -  CO2 22 - 32 mmol/L 23 25 -  Calcium 8.9 - 10.3 mg/dL 8.6(L) 8.8 9.2  Total Protein 6.1 - 8.1 g/dL - 6.6 -  Total Bilirubin 0.2 - 1.2 mg/dL - 0.3 -  Alkaline Phos 25 - 125 - - 65  AST 10 - 35 U/L - 28 37(A)  ALT 6 - 29 U/L - 22 34    Renal function Estimated Creatinine Clearance: 61.9 mL/min (by C-G formula based on SCr of 0.74 mg/dL).  Hemoglobin A1C (no units)  Date Value  04/16/2018 5.5  04/16/2018 5.7   Hgb A1c MFr Bld (% of total Hgb)  Date Value  04/12/2019 6.2 (H)    LDL Cholesterol  Date Value Ref Range Status  04/16/2018 58  Final     Vascular Imaging: Duplex  ultrasound of right groin 05/24/2020 No evidence of pseudoaneurysm, arteriovenous fistula, DVT, large fluid collection.  Yevonne Aline. Stanford Breed, MD [facility] Phone Number: [phone] 05/24/2020 7:53 PM

## 2020-05-24 NOTE — Interval H&P Note (Signed)
History and Physical Interval Note:  05/24/2020 8:38 AM  Rebecca Hartman  has presented today for surgery, with the diagnosis of accelerating angina.  The various methods of treatment have been discussed with the patient and family. After consideration of risks, benefits and other options for treatment, the patient has consented to  Procedure(s): LEFT HEART CATH AND CORONARY ANGIOGRAPHY (N/A) as a surgical intervention.  The patient's history has been reviewed, patient examined, no change in status, stable for surgery.  I have reviewed the patient's chart and labs.  Questions were answered to the patient's satisfaction.    Cath Lab Visit (complete for each Cath Lab visit)  Clinical Evaluation Leading to the Procedure:   ACS: No.  Non-ACS:    Anginal Classification: CCS III  Anti-ischemic medical therapy: Maximal Therapy (2 or more classes of medications)  Non-Invasive Test Results: No non-invasive testing performed  Prior CABG: No previous CABG        Lauree Chandler

## 2020-05-24 NOTE — Progress Notes (Signed)
6E nurse called to cath lab regarding patient Rt groin bleeding. Vaughan Basta and I came to check patient. Upon arrival to room there was a football size hematoma to the Rt Groin. Manual pressure was held for 10 min by Thedore Mins. Dr Angelena Form called and assessed groin and also held pressure. Dr. Roselie Awkward (vascular surgeon) notified and came to assess patient as well. Dr Roselie Awkward ordered a vascular ultrasound and states will come assess groin after ultrasound preformed. Rt groin soft at this time. Bruising noted to the site. VSS. Nurse informed of condition and orders. Nurse states will continue to monitor site. Patient also informed to keep Rt leg straight and to call if any changes to groin.

## 2020-05-24 NOTE — Progress Notes (Signed)
Dr Angelena Form notified of pt's ACT level, see ISTAT results, ok to remove sheath, safety maintained

## 2020-05-24 NOTE — Progress Notes (Signed)
Right groin [place] completed.  Results given to RN    Please see CV Proc for preliminary results.   Vonzell Schlatter, RVT

## 2020-05-24 NOTE — Progress Notes (Signed)
Pt ate grits this am at [place], Rn informed Pt states she felt like her blood sugar was low, pt states she didn't check her CBG

## 2020-05-24 NOTE — Progress Notes (Signed)
Purewick placed for pt comfort, +void noted with clear, yellow urine, safety maintained

## 2020-05-25 ENCOUNTER — Other Ambulatory Visit: Payer: Self-pay | Admitting: Physician Assistant

## 2020-05-25 ENCOUNTER — Encounter (HOSPITAL_COMMUNITY): Payer: Self-pay | Admitting: Cardiovascular Disease

## 2020-05-25 DIAGNOSIS — Z885 Allergy status to narcotic agent status: Secondary | ICD-10-CM | POA: Diagnosis not present

## 2020-05-25 DIAGNOSIS — I10 Essential (primary) hypertension: Secondary | ICD-10-CM | POA: Diagnosis not present

## 2020-05-25 DIAGNOSIS — I200 Unstable angina: Secondary | ICD-10-CM

## 2020-05-25 DIAGNOSIS — E119 Type 2 diabetes mellitus without complications: Secondary | ICD-10-CM | POA: Diagnosis not present

## 2020-05-25 DIAGNOSIS — I25110 Atherosclerotic heart disease of native coronary artery with unstable angina pectoris: Secondary | ICD-10-CM | POA: Diagnosis not present

## 2020-05-25 DIAGNOSIS — Z794 Long term (current) use of insulin: Secondary | ICD-10-CM | POA: Diagnosis not present

## 2020-05-25 DIAGNOSIS — E782 Mixed hyperlipidemia: Secondary | ICD-10-CM

## 2020-05-25 DIAGNOSIS — D509 Iron deficiency anemia, unspecified: Secondary | ICD-10-CM | POA: Diagnosis not present

## 2020-05-25 DIAGNOSIS — M797 Fibromyalgia: Secondary | ICD-10-CM | POA: Diagnosis not present

## 2020-05-25 DIAGNOSIS — E039 Hypothyroidism, unspecified: Secondary | ICD-10-CM | POA: Diagnosis not present

## 2020-05-25 DIAGNOSIS — Z955 Presence of coronary angioplasty implant and graft: Secondary | ICD-10-CM | POA: Diagnosis not present

## 2020-05-25 DIAGNOSIS — E785 Hyperlipidemia, unspecified: Secondary | ICD-10-CM | POA: Diagnosis not present

## 2020-05-25 DIAGNOSIS — I97630 Postprocedural hematoma of a circulatory system organ or structure following a cardiac catheterization: Secondary | ICD-10-CM | POA: Diagnosis not present

## 2020-05-25 LAB — BASIC METABOLIC PANEL
Anion gap: 8 (ref 5–15)
BUN: 9 mg/dL (ref 8–23)
CO2: 21 mmol/L — ABNORMAL LOW (ref 22–32)
Calcium: 8.2 mg/dL — ABNORMAL LOW (ref 8.9–10.3)
Chloride: 100 mmol/L (ref 98–111)
Creatinine, Ser: 0.82 mg/dL (ref 0.44–1.00)
GFR, Estimated: >60 mL/min (ref >60)
Glucose, Bld: 154 mg/dL — ABNORMAL HIGH (ref 70–99)
Potassium: 4.6 mmol/L (ref 3.5–5.1)
Sodium: 129 mmol/L — ABNORMAL LOW (ref 135–145)

## 2020-05-25 LAB — CBC
HCT: 31.7 % — ABNORMAL LOW (ref 36.0–46.0)
Hemoglobin: 10.7 g/dL — ABNORMAL LOW (ref 12.0–15.0)
MCH: 33.6 pg (ref 26.0–34.0)
MCHC: 33.8 g/dL (ref 30.0–36.0)
MCV: 99.7 fL (ref 80.0–100.0)
Platelets: 160 10*3/uL (ref 150–400)
RBC: 3.18 MIL/uL — ABNORMAL LOW (ref 3.87–5.11)
RDW: 13.3 % (ref 11.5–15.5)
WBC: 6.9 10*3/uL (ref 4.0–10.5)
nRBC: 0 % (ref 0.0–0.2)

## 2020-05-25 LAB — GLUCOSE, CAPILLARY
Glucose-Capillary: 122 mg/dL — ABNORMAL HIGH (ref 70–99)
Glucose-Capillary: 136 mg/dL — ABNORMAL HIGH (ref 70–99)

## 2020-05-25 MED ORDER — ASPIRIN 81 MG PO CHEW: 81.0000 mg | CHEWABLE_TABLET | Freq: Every day | ORAL | 3 refills | Status: DC

## 2020-05-25 MED ORDER — CLOPIDOGREL BISULFATE 75 MG PO TABS: 75.0000 mg | ORAL_TABLET | Freq: Every day | ORAL | 3 refills | Status: DC

## 2020-05-25 MED ORDER — ATORVASTATIN CALCIUM 40 MG PO TABS: 40.0000 mg | ORAL_TABLET | Freq: Every day | ORAL | 3 refills | Status: DC

## 2020-05-25 MED FILL — Verapamil HCl IV Soln 2.5 MG/ML: INTRAVENOUS | Qty: 2 | Status: AC

## 2020-05-25 MED FILL — ASPIRIN LOW DOSE 81 MG CHEW: 81 | 90 days supply | Qty: 90 | Fill #0

## 2020-05-25 MED FILL — CLOPIDOGREL 75 MG TABLET: 75 | 90 days supply | Qty: 90 | Fill #0

## 2020-05-25 MED FILL — ATORVASTATIN CALCIUM 40 MG: 40 | 90 days supply | Qty: 90 | Fill #0

## 2020-05-25 NOTE — Progress Notes (Signed)
CARDIAC REHAB PHASE I   PRE:  Rate/Rhythm: 75 SR    BP: sitting 143/53    SaO2: 96 RA  MODE:  Ambulation: 340 ft   POST:  Rate/Rhythm: 85 SR    BP: sitting 167/72     SaO2: 99 RA  Pt somewhat weak but motivated. Ambulated without c/o with contact guard. To recliner. Discussed stent, Plavix, restrictions, diet, exercise, NTG and CRPII. Pt voiced understanding and I will refer to [place], ACSM 05/25/2020 9:08 AM

## 2020-05-25 NOTE — Discharge Instructions (Signed)
Information about your medication: Plavix (anti-platelet agent)  Generic Name (Brand): clopidogrel (Plavix), once daily medication  PURPOSE: You are taking this medication along with aspirin to lower your chance of having a heart attack, stroke, or blood clots in your heart stent. These can be fatal. Plavix and aspirin help prevent platelets from sticking together and forming a clot that can block an artery or your stent.   Common SIDE EFFECTS you may experience include: bruising or bleeding more easily, shortness of breath  Do not stop taking PLAVIX without talking to the doctor who prescribes it for you. People who are treated with a stent and stop taking Plavix too soon, have a higher risk of getting a blood clot in the stent, having a heart attack, or dying. If you stop Plavix because of bleeding, or for other reasons, your risk of a heart attack or stroke may increase.   Avoid taking NSAID agents or anti-inflammatory medications such as ibuprofen, naproxen given increased bleed risk with plavix - can use acetaminophen (Tylenol) if needed for pain.  Avoid taking over the counter stomach medications omeprazole (Prilosec) or esomeprazole (Nexium) since these do interact and make plavix less effective - ask your pharmacist or doctor for alterative agents if needed for heartburn or GERD.   Tell all of your doctors and dentists that you are taking Plavix. They should talk to the doctor who prescribed Plavix for you before you have any surgery or invasive procedure.   Contact your health care provider if you experience: severe or uncontrollable bleeding, pink/red/brown urine, vomiting blood or vomit that looks like "coffee grounds", red or black stools (looks like tar), coughing up blood or blood clots ----------------------------------------------------------------------------------------------------------------------    Femoral Site Care  This sheet gives you information about how to care for  yourself after your procedure. Your health care provider may also give you more specific instructions. If you have problems or questions, contact your health care provider. What can I expect after the procedure? After the procedure, it is common to have:  Bruising that usually fades within 1-2 weeks.  Tenderness at the site. Follow these instructions at home: Wound care  Follow instructions from your health care provider about how to take care of your insertion site. Make sure you: ? Wash your hands with soap and water before you change your bandage (dressing). If soap and water are not available, use hand sanitizer. ? Change your dressing as told by your health care provider. ? Leave stitches (sutures), skin glue, or adhesive strips in place. These skin closures may need to stay in place for 2 weeks or longer. If adhesive strip edges start to loosen and curl up, you may trim the loose edges. Do not remove adhesive strips completely unless your health care provider tells you to do that.  Do not take baths, swim, or use a hot tub until your health care provider approves.  You may shower 24-48 hours after the procedure or as told by your health care provider. ? Gently wash the site with plain soap and water. ? Pat the area dry with a clean towel. ? Do not rub the site. This may cause bleeding.  Do not apply powder or lotion to the site. Keep the site clean and dry.  Check your femoral site every day for signs of infection. Check for: ? Redness, swelling, or pain. ? Fluid or blood. ? Warmth. ? Pus or a bad smell. Activity  For the first 2-3 days after your procedure,  or as long as directed: ? Avoid climbing stairs as much as possible. ? Do not squat.  Do not lift anything that is heavier than 10 lb (4.5 kg), or the limit that you are told, until your health care provider says that it is safe.  Rest as directed. ? Avoid sitting for a long time without moving. Get up to take short  walks every 1-2 hours.  Do not drive for 24 hours if you were given a medicine to help you relax (sedative). General instructions  Take over-the-counter and prescription medicines only as told by your health care provider.  Keep all follow-up visits as told by your health care provider. This is important. Contact a health care provider if you have:  A fever or chills.  You have redness, swelling, or pain around your insertion site. Get help right away if:  The catheter insertion area swells very fast.  You pass out.  You suddenly start to sweat or your skin gets clammy.  The catheter insertion area is bleeding, and the bleeding does not stop when you hold steady pressure on the area.  The area near or just beyond the catheter insertion site becomes pale, cool, tingly, or numb. These symptoms may represent a serious problem that is an emergency. Do not wait to see if the symptoms will go away. Get medical help right away. Call your local emergency services (911 in the [place] Do not drive yourself to the hospital. Summary  After the procedure, it is common to have bruising that usually fades within 1-2 weeks.  Check your femoral site every day for signs of infection.  Do not lift anything that is heavier than 10 lb (4.5 kg), or the limit that you are told, until your health care provider says that it is safe. This information is not intended to replace advice given to you by your health care provider. Make sure you discuss any questions you have with your health care provider. Document Revised: 11/18/2019 Document Reviewed: 11/18/2019 Elsevier Patient Education  2021 [employer].    Low-Sodium Eating Plan Sodium, which is an element that makes up salt, helps you maintain a healthy balance of fluids in your body. Too much sodium can increase your blood pressure and cause fluid and waste to be held in your body. Your health care provider or dietitian may recommend following this  plan if you have high blood pressure (hypertension), kidney disease, liver disease, or heart failure. Eating less sodium can help lower your blood pressure, reduce swelling, and protect your heart, liver, and kidneys. What are tips for following this plan? Reading food labels  The Nutrition Facts label lists the amount of sodium in one serving of the food. If you eat more than one serving, you must multiply the listed amount of sodium by the number of servings.  Choose foods with less than 140 mg of sodium per serving.  Avoid foods with 300 mg of sodium or more per serving. Shopping  Look for lower-sodium products, often labeled as "low-sodium" or "no salt added."  Always check the sodium content, even if foods are labeled as "unsalted" or "no salt added."  Buy fresh foods. ? Avoid canned foods and pre-made or frozen meals. ? Avoid canned, cured, or processed meats.  Buy breads that have less than 80 mg of sodium per slice.   Cooking  Eat more home-cooked food and less restaurant, buffet, and fast food.  Avoid adding salt when cooking. Use salt-free seasonings or  herbs instead of table salt or sea salt. Check with your health care provider or pharmacist before using salt substitutes.  Cook with plant-based oils, such as canola, sunflower, or olive oil.   Meal planning  When eating at a restaurant, ask that your food be prepared with less salt or no salt, if possible. Avoid dishes labeled as brined, pickled, cured, smoked, or made with soy sauce, miso, or teriyaki sauce.  Avoid foods that contain MSG (monosodium glutamate). MSG is sometimes added to [place] food, bouillon, and some canned foods.  Make meals that can be grilled, baked, poached, roasted, or steamed. These are generally made with less sodium. General information Most people on this plan should limit their sodium intake to 1,500-2,000 mg (milligrams) of sodium each day. What foods should I eat? Fruits Fresh, frozen,  or canned fruit. Fruit juice. Vegetables Fresh or frozen vegetables. "No salt added" canned vegetables. "No salt added" tomato sauce and paste. Low-sodium or reduced-sodium tomato and vegetable juice. Grains Low-sodium cereals, including oats, puffed wheat and rice, and shredded wheat. Low-sodium crackers. Unsalted rice. Unsalted pasta. Low-sodium bread. Whole-grain breads and whole-grain pasta. Meats and other proteins Fresh or frozen (no salt added) meat, poultry, seafood, and fish. Low-sodium canned tuna and salmon. Unsalted nuts. Dried peas, beans, and lentils without added salt. Unsalted canned beans. Eggs. Unsalted nut butters. Dairy Milk. Soy milk. Cheese that is naturally low in sodium, such as ricotta cheese, fresh mozzarella, or Swiss cheese. Low-sodium or reduced-sodium cheese. Cream cheese. Yogurt. Seasonings and condiments Fresh and dried herbs and spices. Salt-free seasonings. Low-sodium mustard and ketchup. Sodium-free salad dressing. Sodium-free light mayonnaise. Fresh or refrigerated horseradish. Lemon juice. Vinegar. Other foods Homemade, reduced-sodium, or low-sodium soups. Unsalted popcorn and pretzels. Low-salt or salt-free chips. The items listed above may not be a complete list of foods and beverages you can eat. Contact a dietitian for more information. What foods should I avoid? Vegetables Sauerkraut, pickled vegetables, and relishes. Olives. [place] fries. Onion rings. Regular canned vegetables (not low-sodium or reduced-sodium). Regular canned tomato sauce and paste (not low-sodium or reduced-sodium). Regular tomato and vegetable juice (not low-sodium or reduced-sodium). Frozen vegetables in sauces. Grains Instant hot cereals. Bread stuffing, pancake, and biscuit mixes. Croutons. Seasoned rice or pasta mixes. Noodle soup cups. Boxed or frozen macaroni and cheese. Regular salted crackers. Self-rising flour. Meats and other proteins Meat or fish that is salted, canned,  smoked, spiced, or pickled. Precooked or cured meat, such as sausages or meat loaves. Berniece Salines. Ham. Pepperoni. Hot dogs. Corned beef. Chipped beef. Salt pork. Jerky. Pickled herring. Anchovies and sardines. Regular canned tuna. Salted nuts. Dairy Processed cheese and cheese spreads. Hard cheeses. Cheese curds. Blue cheese. Feta cheese. String cheese. Regular cottage cheese. Buttermilk. Canned milk. Fats and oils Salted butter. Regular margarine. Ghee. Bacon fat. Seasonings and condiments Onion salt, garlic salt, seasoned salt, table salt, and sea salt. Canned and packaged gravies. Worcestershire sauce. Tartar sauce. Barbecue sauce. Teriyaki sauce. Soy sauce, including reduced-sodium. Steak sauce. Fish sauce. Oyster sauce. Cocktail sauce. Horseradish that you find on the shelf. Regular ketchup and mustard. Meat flavorings and tenderizers. Bouillon cubes. Hot sauce. Pre-made or packaged marinades. Pre-made or packaged taco seasonings. Relishes. Regular salad dressings. Salsa. Other foods Salted popcorn and pretzels. Corn chips and puffs. Potato and tortilla chips. Canned or dried soups. Pizza. Frozen entrees and pot pies. The items listed above may not be a complete list of foods and beverages you should avoid. Contact a dietitian for more information. Summary  Eating less sodium can help lower your blood pressure, reduce swelling, and protect your heart, liver, and kidneys.  Most people on this plan should limit their sodium intake to 1,500-2,000 mg (milligrams) of sodium each day.  Canned, boxed, and frozen foods are high in sodium. Restaurant foods, fast foods, and pizza are also very high in sodium. You also get sodium by adding salt to food.  Try to cook at home, eat more fresh fruits and vegetables, and eat less fast food and canned, processed, or prepared foods. This information is not intended to replace advice given to you by your health care provider. Make sure you discuss any questions you  have with your health care provider. Document Revised: 04/22/2019 Document Reviewed: 02/16/2019 Elsevier Patient Education  2021 [employer].

## 2020-05-25 NOTE — Progress Notes (Signed)
[facility]  Subjective  - Doing much better.   Objective (!) 164/65 75 98 F (36.7 C) (Oral) 12 99%  Intake/Output Summary (Last 24 hours) at 05/25/2020 0731 Last data filed at 05/25/2020 0600 Gross per 24 hour  Intake 1473.46 ml  Output 1200 ml  Net 273.46 ml    Dressing changed at MN pin point bleeding on old dressing, no active bleeding at stick site.  Dressing placed over stick site. Palpable left pedal pulse Right groin relatively soft without expansion. Lungs non labored breathing  Assessment/Planning: POD # 1 right groin hematoma post cardiac cath.  Right groin with ecchymosis, mild/min firmness.  No frank hematoma.  Duplex ultrasound shows no pseudoaneurysm, sizable collection.   HGB 10.7 stable OK to mobilize  Roxy Horseman 05/25/2020 7:31 AM --  Laboratory Lab Results: Recent Labs    05/25/20 0128  WBC 6.9  HGB 10.7*  HCT 31.7*  PLT 160   BMET Recent Labs    05/25/20 0128  NA 129*  K 4.6  CL 100  CO2 21*  GLUCOSE 154*  BUN 9  CREATININE 0.82  CALCIUM 8.2*    COAG No results found for: INR, PROTIME No results found for: PTT

## 2020-05-25 NOTE — Discharge Summary (Signed)
Discharge Summary    Patient ID: JNIYA [name] MRN: [id number]; DOB: Aug 21, 1945  Admit date: 05/24/2020 Discharge date: 05/25/2020  PCP:  Celene Squibb, MD   [facility]  Cardiologist:  Rozann Lesches, MD    Discharge Diagnoses    Principal Problem:   Accelerating angina [facility]) Active Problems:   Hypothyroidism   Fibromyalgia   Hypertension   Anemia, iron deficiency   Hyperlipidemia   Controlled type 2 diabetes mellitus with complication, without long-term current use of insulin ([place])   Unstable angina [facility])   Diagnostic Studies/Procedures    Left heart cath 05/24/20:  Mid RCA lesion is 60% stenosed.  Ost Cx to Prox Cx lesion is 65% stenosed.  Dist Cx lesion is 60% stenosed.  Mid LAD lesion is 90% stenosed.  A drug-eluting stent was successfully placed using a STENT RESOLUTE ONYX 3.0X18.  Post intervention, there is a 0% residual stenosis.   1. Severe stenosis mid LAD 2. Successful OCT guided PCI/DES x 1 mid LAD 3. Moderate, heavily calcified ostial Circumflex stenosis.  4. Moderate mid RCA stenosis  Recommendations: Will continue DAPT with ASA and Plavix for at least six months. Will change Pravachol to Lipitor. Continue beta blocker. Medical management of moderate ostial Circumflex and moderate mid RCA disease.   _____________   LE [place] 05/24/20: Negative for PSA, AVF, or DVT    History of Present Illness     Rebecca Hartman is a 75 y.o. female last seen in December 2021.  She presented to clinic with her husband for a follow-up visit.  She called to schedule a follow-up visit secondary to recurring episodes of chest discomfort radiating to the left shoulder and upper arm.  This has been going on intermittently over the last few weeks, prior to that she was not having any symptoms on medical therapy.  She has been using sublingual nitroglycerin during this time.  We have been managing her for ischemic heart disease based on previous  abnormal Myoview in November 2015 indicating mid to apical anteroseptal ischemia.  We had discussed cardiac catheterization in the past, however with no progressive symptoms, she was comfortable with observation.  In light of the recent increasing symptoms however, we discussed risks and benefits of proceeding to a cardiac catheterization with eye toward revascularization, and she is in agreement to proceed.  Hospital Course     Consultants: [facility]  CAD Chest pain concerning for accelerating angina She presented for scheduled heart catheterization that showed severe 90% stenosis in the mid LAD treated with DES. She was discharged on ASA and plavix x at least 6 months. Residual disease in the Cx and RCA will be managed medically. Continue BB and statin.    Groin hematoma Dr. Angelena Form was called to bedside last evening for groin hematoma. Pressure was held by nursing. [facility]VS consulted. [place] negative for PSA. Groin examined again this morning and was stable.    Hyperlipidemia with LDL goal < 7 LDL 68 Pravachol changed to 40 mg lipitor.   DM A1c 5.7% Continue levemir and jardiance.   Did the patient have an acute coronary syndrome (MI, NSTEMI, STEMI, etc) this admission?:  No                               Did the patient have a percutaneous coronary intervention (stent / angioplasty)?:  Yes.     Cath/PCI Registry Performance & Quality Measures: 1. Aspirin  prescribed? - Yes 2. ADP Receptor Inhibitor (Plavix/Clopidogrel, Brilinta/Ticagrelor or Effient/Prasugrel) prescribed (includes medically managed patients)? - Yes 3. High Intensity Statin (Lipitor 40-80mg  or Crestor 20-40mg ) prescribed? - Yes 4. For EF <40%, was ACEI/ARB prescribed? - Not Applicable (EF >/= 62%) 5. For EF <40%, Aldosterone Antagonist (Spironolactone or Eplerenone) prescribed? - Not Applicable (EF >/= 95%) 6. Cardiac Rehab Phase II ordered? - Yes       _____________  Discharge Vitals Blood pressure (!) 126/57, pulse 68,  temperature 98.2 F (36.8 C), temperature source Oral, resp. rate 14, height 5\' 4"  (1.626 m), weight 77.6 kg, SpO2 96 %.  Filed Weights   05/24/20 0704 05/25/20 0458  Weight: 74.4 kg 77.6 kg    Labs & Radiologic Studies    CBC Recent Labs    05/25/20 0128  WBC 6.9  HGB 10.7*  HCT 31.7*  MCV 99.7  PLT 284   Basic Metabolic Panel Recent Labs    05/25/20 0128  NA 129*  K 4.6  CL 100  CO2 21*  GLUCOSE 154*  BUN 9  CREATININE 0.82  CALCIUM 8.2*   Liver Function Tests No results for input(s): AST, ALT, ALKPHOS, BILITOT, PROT, ALBUMIN in the last 72 hours. No results for input(s): LIPASE, AMYLASE in the last 72 hours. High Sensitivity Troponin:   No results for input(s): TROPONINIHS in the last 720 hours.  BNP Invalid input(s): POCBNP D-Dimer No results for input(s): DDIMER in the last 72 hours. Hemoglobin A1C No results for input(s): HGBA1C in the last 72 hours. Fasting Lipid Panel No results for input(s): CHOL, HDL, LDLCALC, TRIG, CHOLHDL, LDLDIRECT in the last 72 hours. Thyroid Function Tests No results for input(s): TSH, T4TOTAL, T3FREE, THYROIDAB in the last 72 hours.  Invalid input(s): FREET3 _____________  CARDIAC CATHETERIZATION  Result Date: 05/24/2020  Mid RCA lesion is 60% stenosed.  Ost Cx to Prox Cx lesion is 65% stenosed.  Dist Cx lesion is 60% stenosed.  Mid LAD lesion is 90% stenosed.  A drug-eluting stent was successfully placed using a STENT RESOLUTE ONYX 3.0X18.  Post intervention, there is a 0% residual stenosis.  1. Severe stenosis mid LAD 2. Successful OCT guided PCI/DES x 1 mid LAD 3. Moderate, heavily calcified ostial Circumflex stenosis. 4. Moderate mid RCA stenosis Recommendations: Will continue DAPT with ASA and Plavix for at least six months. Will change Pravachol to Lipitor. Continue beta blocker. Medical management of moderate ostial Circumflex and moderate mid RCA disease.   VAS [place] GROIN PSEUDOANEURYSM  Result Date: 05/24/2020   ARTERIAL PSEUDOANEURYSM  Exam: Right groin Indications: Patient complains of groin pain, bruising and s/p cath excessive bleeding. Comparison Study: No previous Performing Technologist: Vonzell Schlatter RVT  Examination Guidelines: A complete evaluation includes B-mode imaging, spectral Doppler, color Doppler, and power Doppler as needed of all accessible portions of each vessel. Bilateral testing is considered an integral part of a complete examination. Limited examinations for reoccurring indications may be performed as noted. +------------+----------+--------+------+----------+ Right DuplexPSV (cm/s)WaveformPlaqueComment(s) +------------+----------+--------+------+----------+ Ext.Iliac      132    biphasic                 +------------+----------+--------+------+----------+ CFA            124    biphasic                 +------------+----------+--------+------+----------+ PFA             84    biphasic                 +------------+----------+--------+------+----------+  Prox SFA       115    biphasic                 +------------+----------+--------+------+----------+ Right Vein comments:Patent  Summary: No evidence of pseudoaneurysm, AVF or DVT  Diagnosing physician: Jamelle Haring Electronically signed by Jamelle Haring on 05/24/2020 at 84:04:13 PM.   --------------------------------------------------------------------------------    Final    Disposition   Pt is being discharged home today in good condition.  Follow-up Plans & Appointments   Cardiology follow up has been arranged.  Discharge Instructions    Amb Referral to Cardiac Rehabilitation   Complete by: As directed    Diagnosis:  Coronary Stents PTCA     After initial evaluation and assessments completed: Virtual Based Care may be provided alone or in conjunction with Phase 2 Cardiac Rehab based on patient barriers.: Yes   Diet - low sodium heart healthy   Complete by: As directed    Discharge instructions    Complete by: As directed    No driving for 1 week. No lifting over 5 lbs for 1 week. No sexual activity for 1 week. Keep procedure site clean & dry. If you notice increased pain, swelling, bleeding or pus, call/return!  You may shower, but no soaking baths/hot tubs/pools for 1 week.   Increase activity slowly   Complete by: As directed    No wound care   Complete by: As directed       Discharge Medications   Allergies as of 05/25/2020      Reactions   Shellfish Allergy Itching, Nausea And Vomiting   [place] [hydrocodone-homatropine]    Per patient's husband , this medication caused confusion.   Hydrocodone Itching   Glyburide    Sudden Drop in Blood Sugars   Levofloxacin Itching   Lisinopril Other (See Comments)   SEVERE DROP IN PRESSURE   Morphine Itching, Nausea Only   Pantoprazole Sodium [pantoprazole Sodium] Nausea And Vomiting   PATIENT IS UNSURE OF REACTION      Medication List    STOP taking these medications   pravastatin 20 MG tablet Commonly known as: PRAVACHOL     TAKE these medications   acetaminophen 500 MG tablet Commonly known as: TYLENOL Take 500-1,000 mg by mouth every 6 (six) hours as needed (pain).   alum & mag hydroxide-simeth 200-200-20 MG/5ML suspension Commonly known as: MAALOX/MYLANTA Take 15-30 mLs by mouth every 6 (six) hours as needed for indigestion or heartburn.   aspirin 81 MG chewable tablet Chew 1 tablet (81 mg total) by mouth daily. Start taking on: May 26, 2020   atorvastatin 40 MG tablet Commonly known as: LIPITOR Take 1 tablet (40 mg total) by mouth daily. Start taking on: May 26, 2020   clopidogrel 75 MG tablet Commonly known as: PLAVIX Take 1 tablet (75 mg total) by mouth daily with breakfast. Start taking on: May 26, 2020   diltiazem 120 MG 24 hr capsule Commonly known as: CARDIZEM CD Take 120 mg by mouth 2 (two) times daily.   empagliflozin 10 MG Tabs tablet Commonly known as: JARDIANCE Take 10 mg  by mouth daily.   Enbrel 50 MG/ML injection Generic drug: etanercept Inject 50 mg into the skin every Monday.   escitalopram 10 MG tablet Commonly known as: LEXAPRO Take 10 mg by mouth every evening.   fluconazole 100 MG tablet Commonly known as: DIFLUCAN Take 100 mg by mouth daily as needed (yeast).   fluocinonide-emollient 0.05 % cream Commonly known as: LIDEX-E  Apply 1 application topically 2 (two) times daily as needed (skin irritation).   folic acid 1 MG tablet Commonly known as: FOLVITE Take 1 mg by mouth every evening.   gabapentin 300 MG capsule Commonly known as: NEURONTIN TAKE (1) CAPSULE BY MOUTH TWICE DAILY. What changed: See the new instructions.   hydroxychloroquine 200 MG tablet Commonly known as: PLAQUENIL Take 200 mg by mouth every evening.   isosorbide mononitrate 30 MG 24 hr tablet Commonly known as: IMDUR TAKE 1 TABLET BY MOUTH ONCE DAILY. What changed: when to take this   levothyroxine 50 MCG tablet Commonly known as: SYNTHROID Take 50 mcg by mouth daily.   methotrexate 2.5 MG tablet Take 25 mg by mouth every Wednesday. 10 tablets on Wednesdays   metoprolol tartrate 50 MG tablet Commonly known as: LOPRESSOR TAKE (1) TABLET BY MOUTH TWICE DAILY. What changed: See the new instructions.   mupirocin ointment 2 % Commonly known as: BACTROBAN Apply 1 application topically 2 (two) times daily as needed (wound care).   nitroGLYCERIN 0.4 MG SL tablet Commonly known as: NITROSTAT PLACE 1 TAB UNDER TONGUE EVERY 5 MIN IF NEEDED FOR CHEST PAIN. MAY USE 3 TIMES.NO RELIEF CALL 911. What changed: See the new instructions.   pantoprazole 40 MG tablet Commonly known as: PROTONIX TAKE 1 TABLET BY MOUTH ONCE DAILY.   pseudoephedrine-guaifenesin 60-600 MG 12 hr tablet Commonly known as: MUCINEX D Take 1 tablet by mouth every 12 (twelve) hours as needed for congestion.   SYSTANE BALANCE OP Apply 2 drops to eye 3 (three) times daily as needed  (dry/irritated eyes).   temazepam 30 MG capsule Commonly known as: RESTORIL Take 30 mg by mouth at bedtime.   valsartan 80 MG tablet Commonly known as: DIOVAN Take 80 mg by mouth 2 (two) times daily.   VITAMIN A PO Take 1 tablet by mouth daily.   VITAMIN D3 PO Take 1 tablet by mouth daily.          Outstanding Labs/Studies     Duration of Discharge Encounter   Greater than 30 minutes including physician time.  Signed, Tami Lin Duke, PA 05/25/2020, 12:11 PM

## 2020-06-04 ENCOUNTER — Encounter: Payer: Self-pay | Admitting: Physician Assistant

## 2020-06-04 ENCOUNTER — Ambulatory Visit: Payer: HMO | Admitting: Physician Assistant

## 2020-06-04 ENCOUNTER — Other Ambulatory Visit: Payer: Self-pay

## 2020-06-04 VITALS — BP 128/64 | HR 70 | Ht 64.0 in | Wt 166.2 lb

## 2020-06-04 DIAGNOSIS — M069 Rheumatoid arthritis, unspecified: Secondary | ICD-10-CM

## 2020-06-04 DIAGNOSIS — S301XXA Contusion of abdominal wall, initial encounter: Secondary | ICD-10-CM

## 2020-06-04 DIAGNOSIS — I2510 Atherosclerotic heart disease of native coronary artery without angina pectoris: Secondary | ICD-10-CM | POA: Diagnosis not present

## 2020-06-04 DIAGNOSIS — I10 Essential (primary) hypertension: Secondary | ICD-10-CM

## 2020-06-04 MED ORDER — ATORVASTATIN CALCIUM 40 MG PO TABS: 40.0000 mg | ORAL_TABLET | Freq: Every day | ORAL | 3 refills | Status: DC

## 2020-06-04 MED ORDER — CLOPIDOGREL BISULFATE 75 MG PO TABS: 75.0000 mg | ORAL_TABLET | Freq: Every day | ORAL | 3 refills | Status: DC

## 2020-06-04 NOTE — Progress Notes (Addendum)
Cardiology Office Note   Date:  06/04/2020   ID:  Rebecca Hartman, DOB Mar 15, 1946, MRN [id number]  PCP:  Celene Squibb, MD Cardiologist:  Rozann Lesches, MD 05/17/2020 Electrphysiologist: None Rosaria Ferries, PA-C   No chief complaint on file.   History of Present Illness: Rebecca Hartman is a 75 y.o. female with a history of abnl MV 2015 >> med mgt, DM2, HTN, HLD, hypothyroid, osteoporosis, fibromyalgia.  02/17 office visit, pt w/ increased anginal sx >> cath w/ DES LAD  Rebecca Hartman presents for cardiology follow up.  She has hx PUD, but takes Protonix daily. She has not had any GI symptoms. She takes iron daily for anemia.   No obvious bleeding from the ASA, Plavix.   She had one episode of CP since d/c, yesterday. Relieved with SL NTG x 1.  She did a significant amount of walking this weekend, did well w/ this.  Groin bleed caused significant ecchymosis, it spread over her abd and part of her back. Still w/ some soreness, but doing ok.  The ecchymosis is slowly resolving.  She is a reluctant do cardiac rehab, they do not live nearby. Church members walk in a group, she will walk with them.  No lower extremity edema, denies orthopnea or PND.  No palpitations, no presyncope or syncope.  She feels better in unexpected ways, has more energy and feels more like herself than she did before the heart cath.   Past Medical History:  Diagnosis Date  . Abnormal myocardial perfusion study    November 2015, mid to apical anteroseptal ischemia   . Anemia   . Anxiety   . Arthritis   . Cataract   . Chronic back pain   . Coronary artery disease   . Essential hypertension   . Fibromyalgia   . Hiatal hernia   . History of blood transfusion   . Hypercholesteremia   . Hypothyroidism   . Osteoporosis   . Type 2 diabetes mellitus ([place])     Past Surgical History:  Procedure Laterality Date  . ABDOMINAL HYSTERECTOMY    . BACK SURGERY    . C-EYE SURGERY PROCEDURE    .  CARPAL TUNNEL RELEASE    . CATARACT EXTRACTION    . CHOLECYSTECTOMY    . COLONOSCOPY  08/06/06 FIELDS  . CORONARY STENT INTERVENTION  05/24/2020  . CORONARY STENT INTERVENTION N/A 05/24/2020   Procedure: CORONARY STENT INTERVENTION;  Surgeon: Burnell Blanks, MD;  Location: [place] [facility];  Service: Cardiovascular;  Laterality: N/A;  . DENTAL SURGERY  04/2015   reconstructive surgery lower  . ESOPHAGOGASTRODUODENOSCOPY  10/31/2010   Procedure: ESOPHAGOGASTRODUODENOSCOPY (EGD);  Surgeon: [name], MD;  [facility];  Service: Endoscopy;  Laterality: N/A;  . ESOPHAGOGASTRODUODENOSCOPY  03/05/2011   Procedure: ESOPHAGOGASTRODUODENOSCOPY (EGD);  Surgeon: [name], MD;  [facility];  Service: Endoscopy;  Laterality: N/A;  12:00  . ESOPHAGOGASTRODUODENOSCOPY  04/28/2012   Procedure: ESOPHAGOGASTRODUODENOSCOPY (EGD);  Surgeon: [name], MD;  [facility];  Service: Endoscopy;  Laterality: N/A;  140-moved to 10:30 Ann notified pt  . EYE SURGERY    . INTRAVASCULAR IMAGING/OCT N/A 05/24/2020   Procedure: INTRAVASCULAR IMAGING/OCT;  Surgeon: Burnell Blanks, MD;  Location: [place] [facility];  Service: Cardiovascular;  Laterality: N/A;  . INTRAVASCULAR PRESSURE WIRE/FFR STUDY N/A 05/24/2020   Procedure: INTRAVASCULAR PRESSURE WIRE/FFR STUDY;  Surgeon: Burnell Blanks, MD;  Location: [place] [facility];  Service: Cardiovascular;  Laterality: N/A;  . LEFT HEART CATH AND CORONARY ANGIOGRAPHY N/A 05/24/2020   Procedure: LEFT HEART CATH AND CORONARY ANGIOGRAPHY;  Surgeon: Burnell Blanks, MD;  Location: [facility] [place] [facility];  Service: Cardiovascular;  Laterality: N/A;  . TONSILLECTOMY    . TOTAL HIP REVISION    . UPPER GASTROINTESTINAL ENDOSCOPY  10/03/2010   EGD DILATN PYLORIC CHANNEL  . UPPER GASTROINTESTINAL ENDOSCOPY  07/08/2010  . UPPER GASTROINTESTINAL ENDOSCOPY  08/06/06   FIELDS  . UPPER GASTROINTESTINAL ENDOSCOPY   06/29/2008   EGD    Current Outpatient Medications  Medication Sig Dispense Refill  . acetaminophen (TYLENOL) 500 MG tablet Take 500-1,000 mg by mouth every 6 (six) hours as needed (pain).    Marland Kitchen alum & mag hydroxide-simeth (MAALOX/MYLANTA) 200-200-20 MG/5ML suspension Take 15-30 mLs by mouth every 6 (six) hours as needed for indigestion or heartburn.    Marland Kitchen aspirin 81 MG chewable tablet Chew 1 tablet (81 mg total) by mouth daily. 90 tablet 3  . atorvastatin (LIPITOR) 40 MG tablet Take 1 tablet (40 mg total) by mouth daily. 90 tablet 3  . Cholecalciferol (VITAMIN D3 PO) Take 1 tablet by mouth daily.    . clopidogrel (PLAVIX) 75 MG tablet Take 1 tablet (75 mg total) by mouth daily with breakfast. 180 tablet 3  . diltiazem (CARDIZEM CD) 120 MG 24 hr capsule Take 120 mg by mouth 2 (two) times daily.    . empagliflozin (JARDIANCE) 10 MG TABS tablet Take 10 mg by mouth daily.    Marland Kitchen escitalopram (LEXAPRO) 10 MG tablet Take 10 mg by mouth every evening.    . etanercept (ENBREL) 50 MG/ML injection Inject 50 mg into the skin every Monday.    . fluconazole (DIFLUCAN) 100 MG tablet Take 100 mg by mouth daily as needed (yeast).    . fluocinonide-emollient (LIDEX-E) 0.05 % cream Apply 1 application topically 2 (two) times daily as needed (skin irritation).    . folic acid (FOLVITE) 1 MG tablet Take 1 mg by mouth every evening.    . gabapentin (NEURONTIN) 300 MG capsule TAKE (1) CAPSULE BY MOUTH TWICE DAILY. (Patient taking differently: Take 300 mg by mouth daily.) 60 capsule 0  . hydroxychloroquine (PLAQUENIL) 200 MG tablet Take 200 mg by mouth every evening.    . isosorbide mononitrate (IMDUR) 30 MG 24 hr tablet TAKE 1 TABLET BY MOUTH ONCE DAILY. (Patient taking differently: Take 30 mg by mouth every evening.) 90 tablet 3  . levothyroxine (SYNTHROID, LEVOTHROID) 50 MCG tablet Take 50 mcg by mouth daily.    . methotrexate 2.5 MG tablet Take 25 mg by mouth every Wednesday. 10 tablets on Wednesdays    .  metoprolol tartrate (LOPRESSOR) 50 MG tablet TAKE (1) TABLET BY MOUTH TWICE DAILY. (Patient taking differently: Take 50 mg by mouth 2 (two) times daily.) 180 tablet 0  . nitroGLYCERIN (NITROSTAT) 0.4 MG SL tablet PLACE 1 TAB UNDER TONGUE EVERY 5 MIN IF NEEDED FOR CHEST PAIN. MAY USE 3 TIMES.NO RELIEF CALL 911. (Patient taking differently: Place 0.4 mg under the tongue every 5 (five) minutes x 3 doses as needed.) 25 tablet 1  . pantoprazole (PROTONIX) 40 MG tablet TAKE 1 TABLET BY MOUTH ONCE DAILY. (Patient taking differently: Take 40 mg by mouth daily.) 90 tablet 0  . Propylene Glycol (SYSTANE BALANCE OP) Apply 2 drops to eye 3 (three) times daily as needed (dry/irritated eyes).    . pseudoephedrine-guaifenesin (MUCINEX D) 60-600 MG per tablet Take  1 tablet by mouth every 12 (twelve) hours as needed for congestion.    . temazepam (RESTORIL) 30 MG capsule Take 30 mg by mouth at bedtime.    . valsartan (DIOVAN) 80 MG tablet Take 80 mg by mouth 2 (two) times daily.    Marland Kitchen VITAMIN A PO Take 1 tablet by mouth daily.     Current Facility-Administered Medications  Medication Dose Route Frequency Provider Last Rate Last Admin  . sodium chloride flush (NS) 0.9 % injection 3 mL  3 mL Intravenous Q12H Satira Sark, MD        Allergies:   Shellfish allergy, [place] [hydrocodone-homatropine], Hydrocodone, Glyburide, Levofloxacin, Lisinopril, Morphine, and Pantoprazole sodium [pantoprazole sodium]    Social History:  The patient  reports that she has never smoked. She has never used smokeless tobacco. She reports that she does not drink alcohol and does not use drugs.   Family History:  The patient's family history includes Diabetes in her mother; Healthy in her son; Heart disease in her father; Hyperlipidemia in her father and mother; Hypertension in her father and mother; Stroke in her father and mother.  She indicated that her mother is deceased. She indicated that her father is deceased. She indicated  that her brother is deceased. She indicated that her son is alive.   ROS:  Please see the history of present illness. All other systems are reviewed and negative.    PHYSICAL EXAM: VS:  BP 128/64   Pulse 70   Ht 5\' 4"  (1.626 m)   Wt 166 lb 3.2 oz (75.4 kg)   SpO2 98%   BMI 28.53 kg/m  , BMI Body mass index is 28.53 kg/m. GEN: Well nourished, well developed, female in no acute distress HEENT: normal for age  Neck: no JVD, no carotid bruit, no masses Cardiac: RRR; soft murmur, no rubs, or gallops Respiratory:  clear to auscultation bilaterally, normal work of breathing GI: soft, nontender, nondistended, + BS MS: no deformity or atrophy; no edema; distal pulses are 2+ in all 4 extremities; small hematoma right groin, no bruit Skin: warm and dry, no rash, scattered areas of ecchymosis over the right side of her buttocks and going around to her groin and across her lower abdomen, they are resolving. Neuro:  Strength and sensation are intact Psych: euthymic mood, full affect   EKG:  EKG is not ordered today.   ECHO: 09/09/2019 1. Left ventricular ejection fraction, by estimation, is 60 to 65%. The  left ventricle has normal function. The left ventricle has no regional  wall motion abnormalities. There is mild left ventricular hypertrophy.  Left ventricular diastolic parameters  were normal.  2. Right ventricular systolic function is normal. The right ventricular  size is normal. There is normal pulmonary artery systolic pressure.  3. The mitral valve is normal in structure. Trivial mitral valve  regurgitation. No evidence of mitral stenosis.  4. The aortic valve is tricuspid. Aortic valve regurgitation is trivial.  Mild to moderate aortic valve sclerosis/calcification is present, without  any evidence of aortic stenosis.  5. The inferior vena cava is normal in size with greater than 50%  respiratory variability, suggesting right atrial pressure of 3 mmHg.   CATH:  05/24/2020  Mid RCA lesion is 60% stenosed.  Ost Cx to Prox Cx lesion is 65% stenosed.  Dist Cx lesion is 60% stenosed.  Mid LAD lesion is 90% stenosed.  A drug-eluting stent was successfully placed using a STENT RESOLUTE ONYX 3.0X18.  Post  intervention, there is a 0% residual stenosis.   1. Severe stenosis mid LAD 2. Successful OCT guided PCI/DES x 1 mid LAD 3. Moderate, heavily calcified ostial Circumflex stenosis.  4. Moderate mid RCA stenosis  Recommendations: Will continue DAPT with ASA and Plavix for at least six months. Will change Pravachol to Lipitor. Continue beta blocker. Medical management of moderate ostial Circumflex and moderate mid RCA disease.  Intervention       Recent Labs: 05/25/2020: BUN 9; Creatinine, Ser 0.82; Hemoglobin 10.7; Platelets 160; Potassium 4.6; Sodium 129  CBC    Component Value Date/Time   WBC 6.9 05/25/2020 0128   RBC 3.18 (L) 05/25/2020 0128   HGB 10.7 (L) 05/25/2020 0128   HCT 31.7 (L) 05/25/2020 0128   PLT 160 05/25/2020 0128   MCV 99.7 05/25/2020 0128   MCH 33.6 05/25/2020 0128   MCHC 33.8 05/25/2020 0128   RDW 13.3 05/25/2020 0128   LYMPHSABS 1.7 11/27/2010 0500   MONOABS 0.8 11/27/2010 0500   EOSABS 0.1 11/27/2010 0500   BASOSABS 0.0 11/27/2010 0500   CMP Latest Ref Rng & Units 05/25/2020 05/17/2020 04/12/2019  Glucose 70 - 99 mg/dL 154(H) 108(H) 97  BUN 8 - 23 mg/dL 9 8 12   Creatinine 0.44 - 1.00 mg/dL 0.82 0.74 0.81  Sodium 135 - 145 mmol/L 129(L) 132(L) 139  Potassium 3.5 - 5.1 mmol/L 4.6 4.3 3.9  Chloride 98 - 111 mmol/L 100 104 105  CO2 22 - 32 mmol/L 21(L) 23 25  Calcium 8.9 - 10.3 mg/dL 8.2(L) 8.6(L) 8.8  Total Protein 6.1 - 8.1 g/dL - - 6.6  Total Bilirubin 0.2 - 1.2 mg/dL - - 0.3  Alkaline Phos 25 - 125 - - -  AST 10 - 35 U/L - - 28  ALT 6 - 29 U/L - - 22     Lipid Panel Lab Results  Component Value Date   CHOL 115 04/16/2018   HDL 39 04/16/2018   LDLCALC 58 04/16/2018   TRIG 93 04/16/2018       Wt Readings from Last 3 Encounters:  06/04/20 166 lb 3.2 oz (75.4 kg)  05/25/20 171 lb 1.2 oz (77.6 kg)  05/17/20 164 lb (74.4 kg)     Other studies Reviewed: Additional studies/ records that were reviewed today include: Office notes, hospital records and testing.  ASSESSMENT AND PLAN:  1.  CAD: -She is doing well post cath -She is tolerating aspirin and Plavix well.  She is encouraged to contact us if she feels she is starting to get GI symptoms from them. -Continue Lipitor 40 mg daily, Imdur 30 mg a day and metoprolol 50 mg twice daily. -I showed her the diagram with the moderate lesions in the RCA and circumflex.  I explained if she continues to get chest pain, we can increase the Imdur to 60 mg a day.  She will let us know. -She was given guidelines for a cardiac rehab walking program, she is encouraged to follow those.  She does not wish to drive into [place] 3 times a week to do cardiac rehab here. -Follow-up with Dr. Domenic Polite.  2.  Rheumatoid arthritis -She is on Enbrel and methotrexate per her rheumatologist. -Her AST was within normal limits 03/2020 -Follow-up with rheumatology and check LFTs as scheduled.  3.  Groin hematoma: -She has chronic problems with anemia, and is on iron supplement for this -Her hematoma caused extensive ecchymosis, but this is resolving -She had a two-point drop in her hemoglobin post cath, follow-up  labs as scheduled  4.  Hypertension: -Her blood pressure is well controlled on current therapy, no changes   Current medicines are reviewed at length with the patient today.  The patient does not have concerns regarding medicines.  The following changes have been made:  no change  Labs/ tests ordered today include:  No orders of the defined types were placed in this encounter.    Disposition:   FU with Rozann Lesches, MD  Signed, Rosaria Ferries, PA-C  06/04/2020 3:52 PM    [facility] Phone: [phone];  Fax: [phone]

## 2020-06-04 NOTE — Patient Instructions (Signed)
Medication Instructions:  Your physician recommends that you continue on your current medications as directed. Please refer to the Current Medication list given to you today.  *If you need a refill on your cardiac medications before your next appointment, please call your pharmacy*   Lab Work: None If you have labs (blood work) drawn today and your tests are completely normal, you will receive your results only by: Marland Kitchen MyChart Message (if you have MyChart) OR . A paper copy in the mail If you have any lab test that is abnormal or we need to change your treatment, we will call you to review the results.   Testing/Procedures: None   Follow-Up: At [facility], you and your health needs are our priority.  As part of our continuing mission to provide you with exceptional heart care, we have created designated Provider Care Teams.  These Care Teams include your primary Cardiologist (physician) and Advanced Practice Providers (APPs -  Physician Assistants and Nurse Practitioners) who all work together to provide you with the care you need, when you need it.  We recommend signing up for the patient portal called "MyChart".  Sign up information is provided on this After Visit Summary.  MyChart is used to connect with patients for Virtual Visits (Telemedicine).  Patients are able to view lab/test results, encounter notes, upcoming appointments, etc.  Non-urgent messages can be sent to your provider as well.   To learn more about what you can do with MyChart, go to [email or web address].    Your next appointment:   3 month(s)  The format for your next appointment:   In Person  Provider:   Rozann Lesches, MD   Other Instructions

## 2020-06-18 ENCOUNTER — Other Ambulatory Visit: Payer: Self-pay | Admitting: Cardiology

## 2020-07-30 DIAGNOSIS — D649 Anemia, unspecified: Secondary | ICD-10-CM | POA: Diagnosis not present

## 2020-07-30 DIAGNOSIS — E039 Hypothyroidism, unspecified: Secondary | ICD-10-CM | POA: Diagnosis not present

## 2020-07-30 DIAGNOSIS — E785 Hyperlipidemia, unspecified: Secondary | ICD-10-CM | POA: Diagnosis not present

## 2020-07-30 DIAGNOSIS — E559 Vitamin D deficiency, unspecified: Secondary | ICD-10-CM | POA: Diagnosis not present

## 2020-07-30 DIAGNOSIS — E1165 Type 2 diabetes mellitus with hyperglycemia: Secondary | ICD-10-CM | POA: Diagnosis not present

## 2020-07-30 DIAGNOSIS — I10 Essential (primary) hypertension: Secondary | ICD-10-CM | POA: Diagnosis not present

## 2020-08-02 DIAGNOSIS — E559 Vitamin D deficiency, unspecified: Secondary | ICD-10-CM | POA: Diagnosis not present

## 2020-08-02 DIAGNOSIS — E039 Hypothyroidism, unspecified: Secondary | ICD-10-CM | POA: Diagnosis not present

## 2020-08-02 DIAGNOSIS — M810 Age-related osteoporosis without current pathological fracture: Secondary | ICD-10-CM | POA: Diagnosis not present

## 2020-08-02 DIAGNOSIS — I358 Other nonrheumatic aortic valve disorders: Secondary | ICD-10-CM | POA: Diagnosis not present

## 2020-08-02 DIAGNOSIS — G4700 Insomnia, unspecified: Secondary | ICD-10-CM | POA: Diagnosis not present

## 2020-08-02 DIAGNOSIS — E1169 Type 2 diabetes mellitus with other specified complication: Secondary | ICD-10-CM | POA: Diagnosis not present

## 2020-08-02 DIAGNOSIS — L4050 Arthropathic psoriasis, unspecified: Secondary | ICD-10-CM | POA: Diagnosis not present

## 2020-08-02 DIAGNOSIS — I2510 Atherosclerotic heart disease of native coronary artery without angina pectoris: Secondary | ICD-10-CM | POA: Diagnosis not present

## 2020-08-02 DIAGNOSIS — B373 Candidiasis of vulva and vagina: Secondary | ICD-10-CM | POA: Diagnosis not present

## 2020-08-02 DIAGNOSIS — D509 Iron deficiency anemia, unspecified: Secondary | ICD-10-CM | POA: Diagnosis not present

## 2020-08-02 DIAGNOSIS — E782 Mixed hyperlipidemia: Secondary | ICD-10-CM | POA: Diagnosis not present

## 2020-08-02 DIAGNOSIS — K219 Gastro-esophageal reflux disease without esophagitis: Secondary | ICD-10-CM | POA: Diagnosis not present

## 2020-08-09 DIAGNOSIS — M150 Primary generalized (osteo)arthritis: Secondary | ICD-10-CM | POA: Diagnosis not present

## 2020-08-09 DIAGNOSIS — Z6827 Body mass index (BMI) 27.0-27.9, adult: Secondary | ICD-10-CM | POA: Diagnosis not present

## 2020-08-09 DIAGNOSIS — M2550 Pain in unspecified joint: Secondary | ICD-10-CM | POA: Diagnosis not present

## 2020-08-09 DIAGNOSIS — E663 Overweight: Secondary | ICD-10-CM | POA: Diagnosis not present

## 2020-08-09 DIAGNOSIS — L4050 Arthropathic psoriasis, unspecified: Secondary | ICD-10-CM | POA: Diagnosis not present

## 2020-08-09 DIAGNOSIS — Z79899 Other long term (current) drug therapy: Secondary | ICD-10-CM | POA: Diagnosis not present

## 2020-08-09 DIAGNOSIS — M3500 Sicca syndrome, unspecified: Secondary | ICD-10-CM | POA: Diagnosis not present

## 2020-08-29 DIAGNOSIS — J069 Acute upper respiratory infection, unspecified: Secondary | ICD-10-CM | POA: Diagnosis not present

## 2020-08-29 DIAGNOSIS — J040 Acute laryngitis: Secondary | ICD-10-CM | POA: Diagnosis not present

## 2020-08-29 DIAGNOSIS — R059 Cough, unspecified: Secondary | ICD-10-CM | POA: Diagnosis not present

## 2020-09-10 DIAGNOSIS — J0100 Acute maxillary sinusitis, unspecified: Secondary | ICD-10-CM | POA: Diagnosis not present

## 2020-09-10 DIAGNOSIS — J029 Acute pharyngitis, unspecified: Secondary | ICD-10-CM | POA: Diagnosis not present

## 2020-09-24 DIAGNOSIS — H26493 Other secondary cataract, bilateral: Secondary | ICD-10-CM | POA: Diagnosis not present

## 2020-09-24 DIAGNOSIS — Z961 Presence of intraocular lens: Secondary | ICD-10-CM | POA: Diagnosis not present

## 2020-09-24 DIAGNOSIS — H16223 Keratoconjunctivitis sicca, not specified as Sjogren's, bilateral: Secondary | ICD-10-CM | POA: Diagnosis not present

## 2020-09-24 DIAGNOSIS — H35033 Hypertensive retinopathy, bilateral: Secondary | ICD-10-CM | POA: Diagnosis not present

## 2020-09-24 DIAGNOSIS — E119 Type 2 diabetes mellitus without complications: Secondary | ICD-10-CM | POA: Diagnosis not present

## 2020-09-27 ENCOUNTER — Encounter: Payer: Self-pay | Admitting: Cardiology

## 2020-09-27 NOTE — Progress Notes (Signed)
Cardiology Office Note  Date: 09/28/2020   ID: Rebecca Hartman, DOB 1945-08-14, MRN [id number]  PCP:  Celene Squibb, MD  Cardiologist:  Rozann Lesches, MD Electrophysiologist:  None   Chief Complaint  Patient presents with   Cardiac follow-up     History of Present Illness: Rebecca Hartman is a 75 y.o. female last seen in March by Ms. Barrett PA-C.  She is here for a follow-up visit.  Reports doing well, no active angina on current medications.  She does not report any spontaneous bleeding problems on aspirin and Plavix.  She has felt somewhat fatigued however, noted to be bradycardic today.  I went over her medications and we discussed reducing her Cardizem CD dose for now.  Cardiac catheterization results from February are noted below.  LDL in January was 68.  Past Medical History:  Diagnosis Date   Anemia    Anxiety    Arthritis    Cataract    Chronic back pain    Coronary artery disease    Multivessel disease, status post DES to mid LAD 05/2020   Essential hypertension    Fibromyalgia    Hiatal hernia    History of blood transfusion    Hypercholesteremia    Hypothyroidism    Osteoporosis    Type 2 diabetes mellitus ([place])     Past Surgical History:  Procedure Laterality Date   ABDOMINAL HYSTERECTOMY     BACK SURGERY     C-EYE SURGERY PROCEDURE     CARPAL TUNNEL RELEASE     CATARACT EXTRACTION     CHOLECYSTECTOMY     COLONOSCOPY  08/06/06 FIELDS   CORONARY STENT INTERVENTION  05/24/2020   CORONARY STENT INTERVENTION N/A 05/24/2020   Procedure: CORONARY STENT INTERVENTION;  Surgeon: Burnell Blanks, MD;  Location: [place] [facility];  Service: Cardiovascular;  Laterality: N/A;   DENTAL SURGERY  04/2015   reconstructive surgery lower   ESOPHAGOGASTRODUODENOSCOPY  10/31/2010   Procedure: ESOPHAGOGASTRODUODENOSCOPY (EGD);  Surgeon: [name], MD;  [facility];  Service: Endoscopy;  Laterality: N/A;   ESOPHAGOGASTRODUODENOSCOPY  03/05/2011    Procedure: ESOPHAGOGASTRODUODENOSCOPY (EGD);  Surgeon: [name], MD;  [facility];  Service: Endoscopy;  Laterality: N/A;  12:00   ESOPHAGOGASTRODUODENOSCOPY  04/28/2012   Procedure: ESOPHAGOGASTRODUODENOSCOPY (EGD);  Surgeon: [name], MD;  [facility];  Service: Endoscopy;  Laterality: N/A;  140-moved to 10:30 Ann notified pt   EYE SURGERY     INTRAVASCULAR IMAGING/OCT N/A 05/24/2020   Procedure: INTRAVASCULAR IMAGING/OCT;  Surgeon: Burnell Blanks, MD;  Location: [place] [facility];  Service: Cardiovascular;  Laterality: N/A;   INTRAVASCULAR PRESSURE WIRE/FFR STUDY N/A 05/24/2020   Procedure: INTRAVASCULAR PRESSURE WIRE/FFR STUDY;  Surgeon: Burnell Blanks, MD;  Location: [place] [facility];  Service: Cardiovascular;  Laterality: N/A;   LEFT HEART CATH AND CORONARY ANGIOGRAPHY N/A 05/24/2020   Procedure: LEFT HEART CATH AND CORONARY ANGIOGRAPHY;  Surgeon: Burnell Blanks, MD;  Location: [place] [facility];  Service: Cardiovascular;  Laterality: N/A;   TONSILLECTOMY     TOTAL HIP REVISION     UPPER GASTROINTESTINAL ENDOSCOPY  10/03/2010   EGD DILATN PYLORIC CHANNEL   UPPER GASTROINTESTINAL ENDOSCOPY  07/08/2010   UPPER GASTROINTESTINAL ENDOSCOPY  08/06/06   FIELDS   UPPER GASTROINTESTINAL ENDOSCOPY  06/29/2008   EGD    Current Outpatient Medications  Medication Sig Dispense Refill   acetaminophen (TYLENOL) 500 MG tablet Take  500-1,000 mg by mouth every 6 (six) hours as needed (pain).     alum & mag hydroxide-simeth (MAALOX/MYLANTA) 200-200-20 MG/5ML suspension Take 15-30 mLs by mouth every 6 (six) hours as needed for indigestion or heartburn.     aspirin 81 MG chewable tablet Chew 1 tablet (81 mg total) by mouth daily. 90 tablet 3   atorvastatin (LIPITOR) 40 MG tablet Take 1 tablet (40 mg total) by mouth daily. 90 tablet 3   Cholecalciferol (VITAMIN D3 PO) Take 1 tablet by mouth daily.     clopidogrel (PLAVIX) 75 MG tablet Take 1  tablet (75 mg total) by mouth daily with breakfast. 90 tablet 3   diltiazem (CARDIZEM CD) 120 MG 24 hr capsule Take 1 capsule (120 mg total) by mouth daily. 90 capsule 3   empagliflozin (JARDIANCE) 10 MG TABS tablet Take 10 mg by mouth daily.     etanercept (ENBREL) 50 MG/ML injection Inject 50 mg into the skin every Monday.     fluconazole (DIFLUCAN) 100 MG tablet Take 100 mg by mouth daily as needed (yeast).     folic acid (FOLVITE) 1 MG tablet Take 1 mg by mouth every evening.     hydroxychloroquine (PLAQUENIL) 200 MG tablet Take 200 mg by mouth every evening.     isosorbide mononitrate (IMDUR) 30 MG 24 hr tablet TAKE 1 TABLET BY MOUTH ONCE DAILY. 90 tablet 3   levothyroxine (SYNTHROID, LEVOTHROID) 50 MCG tablet Take 50 mcg by mouth daily.     methotrexate 2.5 MG tablet Take 25 mg by mouth every Wednesday. 10 tablets on Wednesdays     metoprolol tartrate (LOPRESSOR) 50 MG tablet TAKE (1) TABLET BY MOUTH TWICE DAILY. (Patient taking differently: Take 50 mg by mouth 2 (two) times daily.) 180 tablet 0   nitroGLYCERIN (NITROSTAT) 0.4 MG SL tablet PLACE 1 TAB UNDER TONGUE EVERY 5 MIN IF NEEDED FOR CHEST PAIN. MAY USE 3 TIMES.NO RELIEF CALL 911. (Patient taking differently: Place 0.4 mg under the tongue every 5 (five) minutes x 3 doses as needed.) 25 tablet 1   Propylene Glycol (SYSTANE BALANCE OP) Apply 2 drops to eye 3 (three) times daily as needed (dry/irritated eyes).     pseudoephedrine-guaifenesin (MUCINEX D) 60-600 MG per tablet Take 1 tablet by mouth every 12 (twelve) hours as needed for congestion.     temazepam (RESTORIL) 30 MG capsule Take 30 mg by mouth at bedtime.     valsartan (DIOVAN) 80 MG tablet Take 80 mg by mouth 2 (two) times daily.     VITAMIN A PO Take 1 tablet by mouth daily.     JARDIANCE 25 MG TABS tablet Take 25 mg by mouth daily.     Current Facility-Administered Medications  Medication Dose Route Frequency Provider Last Rate Last Admin   sodium chloride flush (NS) 0.9 %  injection 3 mL  3 mL Intravenous Q12H Satira Sark, MD       Allergies:  Shellfish allergy, Alben Spittle [hydrocodone bit-homatrop mbr], Hydrocodone, Glyburide, Levofloxacin, Lisinopril, Morphine, and Pantoprazole sodium [pantoprazole sodium]   ROS: No palpitations or syncope.  Physical Exam: VS:  BP 116/60   Pulse (!) 57   Ht 5\' 5"  (1.651 m)   Wt 156 lb (70.8 kg)   SpO2 98%   BMI 25.96 kg/m , BMI Body mass index is 25.96 kg/m.  Wt Readings from Last 3 Encounters:  09/28/20 156 lb (70.8 kg)  06/04/20 166 lb 3.2 oz (75.4 kg)  05/25/20 171 lb 1.2 oz (77.6  kg)    General: Patient appears comfortable at rest. HEENT: Conjunctiva and lids normal, wearing a mask. Neck: Supple, no elevated JVP or carotid bruits, no thyromegaly. Lungs: Clear to auscultation, nonlabored breathing at rest. Cardiac: Regular rate and rhythm, no S3 or significant systolic murmur, no pericardial rub. Extremities: No pitting edema.  ECG:  An ECG dated 05/25/2020 was personally reviewed today and demonstrated:  Sinus rhythm.  Recent Labwork: 05/25/2020: BUN 9; Creatinine, Ser 0.82; Hemoglobin 10.7; Platelets 160; Potassium 4.6; Sodium 129  January 2022: Cholesterol 118, triglycerides 78, HDL 34, LDL 68  Other Studies Reviewed Today:  Cardiac catheterization 05/24/2020: Mid RCA lesion is 60% stenosed. Ost Cx to Prox Cx lesion is 65% stenosed. Dist Cx lesion is 60% stenosed. Mid LAD lesion is 90% stenosed. A drug-eluting stent was successfully placed using a STENT RESOLUTE ONYX 3.0X18. Post intervention, there is a 0% residual stenosis.   1. Severe stenosis mid LAD 2. Successful OCT guided PCI/DES x 1 mid LAD 3. Moderate, heavily calcified ostial Circumflex stenosis. 4. Moderate mid RCA stenosis  Assessment and Plan:  1.  CAD status post DES to the mid LAD in February with otherwise medically managed circumflex and RCA disease.  She is doing well without active angina and continues on dual antiplatelet  therapy.  Given fatigue and bradycardia we will reduce Cardizem CD to 120 mg daily.  Otherwise no change in Imdur, Lipitor, Diovan, or Lopressor.  2.  Type 2 diabetes mellitus, followed by Dr. [place] Crane.  She is on Jardiance.  3.  Mixed hyperlipidemia, tolerating Lipitor.  Last LDL 68.  Medication Adjustments/Labs and Tests Ordered: Current medicines are reviewed at length with the patient today.  Concerns regarding medicines are outlined above.   Tests Ordered: No orders of the defined types were placed in this encounter.   Medication Changes: Meds ordered this encounter  Medications   diltiazem (CARDIZEM CD) 120 MG 24 hr capsule    Sig: Take 1 capsule (120 mg total) by mouth daily.    Dispense:  90 capsule    Refill:  3    09/28/20 dose decreased to qd from BID     Disposition:  Follow up  6 months.  Signed, Satira Sark, MD, [facility] 09/28/2020 2:43 PM    [facility] at [facility] [address] [place], [place] [address] Phone: [phone]; Fax: [phone]

## 2020-09-28 ENCOUNTER — Encounter: Payer: Self-pay | Admitting: Cardiology

## 2020-09-28 ENCOUNTER — Other Ambulatory Visit: Payer: Self-pay

## 2020-09-28 ENCOUNTER — Ambulatory Visit: Payer: HMO | Admitting: Cardiology

## 2020-09-28 VITALS — BP 116/60 | HR 57 | Ht 65.0 in | Wt 156.0 lb

## 2020-09-28 DIAGNOSIS — I25119 Atherosclerotic heart disease of native coronary artery with unspecified angina pectoris: Secondary | ICD-10-CM | POA: Diagnosis not present

## 2020-09-28 DIAGNOSIS — E782 Mixed hyperlipidemia: Secondary | ICD-10-CM | POA: Diagnosis not present

## 2020-09-28 MED ORDER — DILTIAZEM HCL ER COATED BEADS 120 MG PO CP24: 120.0000 mg | ORAL_CAPSULE | Freq: Every day | ORAL | 3 refills | Status: AC

## 2020-09-28 NOTE — Patient Instructions (Signed)
Medication Instructions:   DECREASE Cardizem CD to 120 mg daily    *If you need a refill on your cardiac medications before your next appointment, please call your pharmacy*   Lab Work:  None today  If you have labs (blood work) drawn today and your tests are completely normal, you will receive your results only by: [place] (if you have MyChart) OR A paper copy in the mail If you have any lab test that is abnormal or we need to change your treatment, we will call you to review the results.   Testing/Procedures: None today    Follow-Up: At [facility], you and your health needs are our priority.  As part of our continuing mission to provide you with exceptional heart care, we have created designated Provider Care Teams.  These Care Teams include your primary Cardiologist (physician) and Advanced Practice Providers (APPs -  Physician Assistants and Nurse Practitioners) who all work together to provide you with the care you need, when you need it.  We recommend signing up for the patient portal called "MyChart".  Sign up information is provided on this After Visit Summary.  MyChart is used to connect with patients for Virtual Visits (Telemedicine).  Patients are able to view lab/test results, encounter notes, upcoming appointments, etc.  Non-urgent messages can be sent to your provider as well.   To learn more about what you can do with MyChart, go to [email or web address].    Your next appointment:   6 month(s)  The format for your next appointment:   In Person  Provider:   Dr.McDowell    Other Instructions None

## 2020-11-01 DIAGNOSIS — E782 Mixed hyperlipidemia: Secondary | ICD-10-CM | POA: Diagnosis not present

## 2020-11-01 DIAGNOSIS — E039 Hypothyroidism, unspecified: Secondary | ICD-10-CM | POA: Diagnosis not present

## 2020-11-01 DIAGNOSIS — E1169 Type 2 diabetes mellitus with other specified complication: Secondary | ICD-10-CM | POA: Diagnosis not present

## 2020-11-01 DIAGNOSIS — E559 Vitamin D deficiency, unspecified: Secondary | ICD-10-CM | POA: Diagnosis not present

## 2020-11-01 DIAGNOSIS — D509 Iron deficiency anemia, unspecified: Secondary | ICD-10-CM | POA: Diagnosis not present

## 2020-11-08 ENCOUNTER — Encounter: Payer: Self-pay | Admitting: Cardiology

## 2020-11-08 DIAGNOSIS — K219 Gastro-esophageal reflux disease without esophagitis: Secondary | ICD-10-CM | POA: Diagnosis not present

## 2020-11-08 DIAGNOSIS — E782 Mixed hyperlipidemia: Secondary | ICD-10-CM | POA: Diagnosis not present

## 2020-11-08 DIAGNOSIS — E559 Vitamin D deficiency, unspecified: Secondary | ICD-10-CM | POA: Diagnosis not present

## 2020-11-08 DIAGNOSIS — M810 Age-related osteoporosis without current pathological fracture: Secondary | ICD-10-CM | POA: Diagnosis not present

## 2020-11-08 DIAGNOSIS — I2510 Atherosclerotic heart disease of native coronary artery without angina pectoris: Secondary | ICD-10-CM | POA: Diagnosis not present

## 2020-11-08 DIAGNOSIS — G4700 Insomnia, unspecified: Secondary | ICD-10-CM | POA: Diagnosis not present

## 2020-11-08 DIAGNOSIS — D509 Iron deficiency anemia, unspecified: Secondary | ICD-10-CM | POA: Diagnosis not present

## 2020-11-08 DIAGNOSIS — I358 Other nonrheumatic aortic valve disorders: Secondary | ICD-10-CM | POA: Diagnosis not present

## 2020-11-08 DIAGNOSIS — L4050 Arthropathic psoriasis, unspecified: Secondary | ICD-10-CM | POA: Diagnosis not present

## 2020-11-08 DIAGNOSIS — E1169 Type 2 diabetes mellitus with other specified complication: Secondary | ICD-10-CM | POA: Diagnosis not present

## 2020-11-08 DIAGNOSIS — B373 Candidiasis of vulva and vagina: Secondary | ICD-10-CM | POA: Diagnosis not present

## 2020-11-08 DIAGNOSIS — E039 Hypothyroidism, unspecified: Secondary | ICD-10-CM | POA: Diagnosis not present

## 2020-11-09 DIAGNOSIS — Z79899 Other long term (current) drug therapy: Secondary | ICD-10-CM | POA: Diagnosis not present

## 2020-11-09 DIAGNOSIS — E663 Overweight: Secondary | ICD-10-CM | POA: Diagnosis not present

## 2020-11-09 DIAGNOSIS — L4050 Arthropathic psoriasis, unspecified: Secondary | ICD-10-CM | POA: Diagnosis not present

## 2020-11-09 DIAGNOSIS — Z6827 Body mass index (BMI) 27.0-27.9, adult: Secondary | ICD-10-CM | POA: Diagnosis not present

## 2020-11-09 DIAGNOSIS — M2550 Pain in unspecified joint: Secondary | ICD-10-CM | POA: Diagnosis not present

## 2020-11-09 DIAGNOSIS — M3500 Sicca syndrome, unspecified: Secondary | ICD-10-CM | POA: Diagnosis not present

## 2020-11-09 DIAGNOSIS — M150 Primary generalized (osteo)arthritis: Secondary | ICD-10-CM | POA: Diagnosis not present

## 2020-11-09 DIAGNOSIS — G5602 Carpal tunnel syndrome, left upper limb: Secondary | ICD-10-CM | POA: Diagnosis not present

## 2020-12-19 DIAGNOSIS — Z23 Encounter for immunization: Secondary | ICD-10-CM | POA: Diagnosis not present

## 2020-12-28 ENCOUNTER — Ambulatory Visit: Payer: HMO | Admitting: Podiatrist

## 2020-12-28 ENCOUNTER — Encounter: Payer: Self-pay | Admitting: Podiatrist

## 2020-12-28 ENCOUNTER — Other Ambulatory Visit: Payer: Self-pay

## 2020-12-28 DIAGNOSIS — E1165 Type 2 diabetes mellitus with hyperglycemia: Secondary | ICD-10-CM | POA: Diagnosis not present

## 2020-12-28 DIAGNOSIS — I10 Essential (primary) hypertension: Secondary | ICD-10-CM | POA: Diagnosis not present

## 2020-12-28 DIAGNOSIS — L600 Ingrowing nail: Secondary | ICD-10-CM | POA: Diagnosis not present

## 2020-12-28 NOTE — Patient Instructions (Signed)

## 2020-12-28 NOTE — Progress Notes (Signed)
Chief Complaint  Patient presents with   Nail Problem    Left great nail thickness and pain. Pt states her nails need to be totally removed.      HPI: Patient is 75 y.o. female who presents today for a thickened painful left great toenail which has been causing her trouble for several months duration.  She is diabetic and states that her blood sugar is under great control at this moment.  She relates she is tried trimming the nail back in the past however it keeps returning thick and she would like it removed.  Patient Active Problem List   Diagnosis Date Noted   Unstable angina ([place]) 05/24/2020   Accelerating angina ([place])    Nail dystrophy 05/04/2020   Acute recurrent maxillary sinusitis 05/10/2019   Controlled type 2 diabetes mellitus with complication, without long-term current use of insulin ([place]) 04/20/2019   Arthritis with psoriasis ([place]) 04/05/2019   Insomnia 04/05/2019   Abnormal myocardial perfusion study 02/09/2014   Precordial pain 01/26/2014   Hyperlipidemia 01/26/2014   Anemia, iron deficiency 10/28/2010   Hypertension 10/24/2010   Hypothyroidism    Fibromyalgia     Current Outpatient Medications on File Prior to Visit  Medication Sig Dispense Refill   acetaminophen (TYLENOL) 500 MG tablet Take 500-1,000 mg by mouth every 6 (six) hours as needed (pain).     alum & mag hydroxide-simeth (MAALOX/MYLANTA) 200-200-20 MG/5ML suspension Take 15-30 mLs by mouth every 6 (six) hours as needed for indigestion or heartburn.     aspirin 81 MG chewable tablet Chew 1 tablet (81 mg total) by mouth daily. 90 tablet 3   atorvastatin (LIPITOR) 40 MG tablet Take 1 tablet (40 mg total) by mouth daily. 90 tablet 3   Cholecalciferol (VITAMIN D3 PO) Take 1 tablet by mouth daily.     clopidogrel (PLAVIX) 75 MG tablet Take 1 tablet (75 mg total) by mouth daily with breakfast. 90 tablet 3   diltiazem (CARDIZEM CD) 120 MG 24 hr capsule Take 1 capsule (120 mg total) by mouth daily. 90 capsule 3    empagliflozin (JARDIANCE) 10 MG TABS tablet Take 10 mg by mouth daily.     etanercept (ENBREL) 50 MG/ML injection Inject 50 mg into the skin every Monday.     fluconazole (DIFLUCAN) 100 MG tablet Take 100 mg by mouth daily as needed (yeast).     folic acid (FOLVITE) 1 MG tablet Take 1 mg by mouth every evening.     hydroxychloroquine (PLAQUENIL) 200 MG tablet Take 200 mg by mouth every evening.     isosorbide mononitrate (IMDUR) 30 MG 24 hr tablet TAKE 1 TABLET BY MOUTH ONCE DAILY. 90 tablet 3   JARDIANCE 25 MG TABS tablet Take 25 mg by mouth daily.     levothyroxine (SYNTHROID, LEVOTHROID) 50 MCG tablet Take 50 mcg by mouth daily.     methotrexate 2.5 MG tablet Take 25 mg by mouth every Wednesday. 10 tablets on Wednesdays     metoprolol tartrate (LOPRESSOR) 50 MG tablet TAKE (1) TABLET BY MOUTH TWICE DAILY. (Patient taking differently: Take 50 mg by mouth 2 (two) times daily.) 180 tablet 0   nitroGLYCERIN (NITROSTAT) 0.4 MG SL tablet PLACE 1 TAB UNDER TONGUE EVERY 5 MIN IF NEEDED FOR CHEST PAIN. MAY USE 3 TIMES.NO RELIEF CALL 911. (Patient taking differently: Place 0.4 mg under the tongue every 5 (five) minutes x 3 doses as needed.) 25 tablet 1   Propylene Glycol (SYSTANE BALANCE OP) Apply 2 drops to  eye 3 (three) times daily as needed (dry/irritated eyes).     pseudoephedrine-guaifenesin (MUCINEX D) 60-600 MG per tablet Take 1 tablet by mouth every 12 (twelve) hours as needed for congestion.     temazepam (RESTORIL) 30 MG capsule Take 30 mg by mouth at bedtime.     valsartan (DIOVAN) 80 MG tablet Take 80 mg by mouth 2 (two) times daily.     VITAMIN A PO Take 1 tablet by mouth daily.     Current Facility-Administered Medications on File Prior to Visit  Medication Dose Route Frequency Provider Last Rate Last Admin   sodium chloride flush (NS) 0.9 % injection 3 mL  3 mL Intravenous Q12H Satira Sark, MD        Allergies  Allergen Reactions   Shellfish Allergy Itching and Nausea And  Vomiting   Tussigon [Hydrocodone Bit-Homatrop Mbr]     Per patient's husband , this medication caused confusion.   Hydrocodone Itching   Glyburide     Sudden Drop in Blood Sugars   Levofloxacin Itching   Lisinopril Other (See Comments)    SEVERE DROP IN PRESSURE   Morphine Itching and Nausea Only   Pantoprazole Sodium [Pantoprazole Sodium] Nausea And Vomiting    PATIENT IS UNSURE OF REACTION    Review of Systems No fevers, chills, nausea, muscle aches, no difficulty breathing, no calf pain, no chest pain or shortness of breath.   Physical Exam  GENERAL APPEARANCE: Alert, conversant. Appropriately groomed. No acute distress.   VASCULAR: Pedal pulses palpable DP and PT bilateral.  Capillary refill time is immediate to all digits,  Proximal to distal cooling it warm to warm.  Digital perfusion adequate.   NEUROLOGIC: sensation is intact to 5.07 monofilament at 5/5 sites bilateral.  Light touch is intact bilateral, vibratory sensation intact bilateral  MUSCULOSKELETAL: acceptable muscle strength, tone and stability bilateral.  Bunion deformity present left first metatarsal phalangeal joint.  Elevatus of the left hallux is also noted.    DERMATOLOGIC: skin is warm, supple, and dry.  No open lesions noted.  Left hallux nail is thickened and dystrophic.  It is painful with medial to lateral pressure and dorsal pressure as well.  Some redness and tenderness is also present on the distal aspect of the toe itself.  No sign of pus or purulence is seen.   Assessment   1. Ingrown toenail of left foot      Plan  Treatment options and alternatives were discussed with the patient.  Option of debridement of the toenail versus permanent removal was given.  And the patient would like to have the nail permanent removed.  I did agree and prepped the skin with alcohol infiltrated lidocaine and Marcaine mixture in a digital block fashion.  The toe was then prepped with Betadine and exsanguinated and  the thickened and ingrown toenail was then carefully removed in total.  Phenol was applied to the matrix tissue followed by an alcohol wash and Silvadene cream and a dry sterile compressive dressing was then applied.  The patient was given instructions for soaks and aftercare and was instructed to call if she notices any redness, swelling, drainage or concern for infection.  Otherwise this should go on to heal uneventfully.

## 2021-01-28 DIAGNOSIS — I10 Essential (primary) hypertension: Secondary | ICD-10-CM | POA: Diagnosis not present

## 2021-01-28 DIAGNOSIS — E1165 Type 2 diabetes mellitus with hyperglycemia: Secondary | ICD-10-CM | POA: Diagnosis not present

## 2021-02-04 DIAGNOSIS — L4050 Arthropathic psoriasis, unspecified: Secondary | ICD-10-CM | POA: Diagnosis not present

## 2021-02-04 DIAGNOSIS — L409 Psoriasis, unspecified: Secondary | ICD-10-CM | POA: Diagnosis not present

## 2021-02-04 DIAGNOSIS — M25532 Pain in left wrist: Secondary | ICD-10-CM | POA: Diagnosis not present

## 2021-02-12 DIAGNOSIS — M150 Primary generalized (osteo)arthritis: Secondary | ICD-10-CM | POA: Diagnosis not present

## 2021-02-12 DIAGNOSIS — R5383 Other fatigue: Secondary | ICD-10-CM | POA: Diagnosis not present

## 2021-02-12 DIAGNOSIS — L4050 Arthropathic psoriasis, unspecified: Secondary | ICD-10-CM | POA: Diagnosis not present

## 2021-02-12 DIAGNOSIS — E663 Overweight: Secondary | ICD-10-CM | POA: Diagnosis not present

## 2021-02-12 DIAGNOSIS — Z79899 Other long term (current) drug therapy: Secondary | ICD-10-CM | POA: Diagnosis not present

## 2021-02-12 DIAGNOSIS — M3500 Sicca syndrome, unspecified: Secondary | ICD-10-CM | POA: Diagnosis not present

## 2021-02-12 DIAGNOSIS — Z6827 Body mass index (BMI) 27.0-27.9, adult: Secondary | ICD-10-CM | POA: Diagnosis not present

## 2021-02-12 DIAGNOSIS — M797 Fibromyalgia: Secondary | ICD-10-CM | POA: Diagnosis not present

## 2021-02-27 DIAGNOSIS — E782 Mixed hyperlipidemia: Secondary | ICD-10-CM | POA: Diagnosis not present

## 2021-02-27 DIAGNOSIS — I10 Essential (primary) hypertension: Secondary | ICD-10-CM | POA: Diagnosis not present

## 2021-03-12 DIAGNOSIS — E782 Mixed hyperlipidemia: Secondary | ICD-10-CM | POA: Diagnosis not present

## 2021-03-12 DIAGNOSIS — E1169 Type 2 diabetes mellitus with other specified complication: Secondary | ICD-10-CM | POA: Diagnosis not present

## 2021-03-12 DIAGNOSIS — D509 Iron deficiency anemia, unspecified: Secondary | ICD-10-CM | POA: Diagnosis not present

## 2021-03-15 DIAGNOSIS — I2510 Atherosclerotic heart disease of native coronary artery without angina pectoris: Secondary | ICD-10-CM | POA: Diagnosis not present

## 2021-03-15 DIAGNOSIS — E039 Hypothyroidism, unspecified: Secondary | ICD-10-CM | POA: Diagnosis not present

## 2021-03-15 DIAGNOSIS — M810 Age-related osteoporosis without current pathological fracture: Secondary | ICD-10-CM | POA: Diagnosis not present

## 2021-03-15 DIAGNOSIS — D509 Iron deficiency anemia, unspecified: Secondary | ICD-10-CM | POA: Diagnosis not present

## 2021-03-15 DIAGNOSIS — E559 Vitamin D deficiency, unspecified: Secondary | ICD-10-CM | POA: Diagnosis not present

## 2021-03-15 DIAGNOSIS — B3731 Acute candidiasis of vulva and vagina: Secondary | ICD-10-CM | POA: Diagnosis not present

## 2021-03-15 DIAGNOSIS — I358 Other nonrheumatic aortic valve disorders: Secondary | ICD-10-CM | POA: Diagnosis not present

## 2021-03-15 DIAGNOSIS — Z0001 Encounter for general adult medical examination with abnormal findings: Secondary | ICD-10-CM | POA: Diagnosis not present

## 2021-03-15 DIAGNOSIS — E782 Mixed hyperlipidemia: Secondary | ICD-10-CM | POA: Diagnosis not present

## 2021-03-15 DIAGNOSIS — K219 Gastro-esophageal reflux disease without esophagitis: Secondary | ICD-10-CM | POA: Diagnosis not present

## 2021-03-15 DIAGNOSIS — E1169 Type 2 diabetes mellitus with other specified complication: Secondary | ICD-10-CM | POA: Diagnosis not present

## 2021-03-15 DIAGNOSIS — L4050 Arthropathic psoriasis, unspecified: Secondary | ICD-10-CM | POA: Diagnosis not present

## 2021-03-18 ENCOUNTER — Encounter: Payer: Self-pay | Admitting: Cardiology

## 2021-03-29 DIAGNOSIS — I10 Essential (primary) hypertension: Secondary | ICD-10-CM | POA: Diagnosis not present

## 2021-03-29 DIAGNOSIS — E782 Mixed hyperlipidemia: Secondary | ICD-10-CM | POA: Diagnosis not present

## 2021-04-17 DIAGNOSIS — D649 Anemia, unspecified: Secondary | ICD-10-CM | POA: Diagnosis not present

## 2021-04-26 DIAGNOSIS — D509 Iron deficiency anemia, unspecified: Secondary | ICD-10-CM | POA: Diagnosis not present

## 2021-04-28 DIAGNOSIS — I10 Essential (primary) hypertension: Secondary | ICD-10-CM | POA: Diagnosis not present

## 2021-04-28 DIAGNOSIS — E039 Hypothyroidism, unspecified: Secondary | ICD-10-CM | POA: Diagnosis not present

## 2021-04-28 DIAGNOSIS — E1140 Type 2 diabetes mellitus with diabetic neuropathy, unspecified: Secondary | ICD-10-CM | POA: Diagnosis not present

## 2021-04-28 DIAGNOSIS — E782 Mixed hyperlipidemia: Secondary | ICD-10-CM | POA: Diagnosis not present

## 2021-05-09 ENCOUNTER — Ambulatory Visit: Payer: HMO | Admitting: Podiatry

## 2021-05-13 NOTE — Progress Notes (Signed)
[facility] [address], [place] [address]   CLINIC:  Medical Oncology/Hematology  CONSULT NOTE  Patient Care Team: Celene Squibb, MD as PCP - General (Internal Medicine) Satira Sark, MD as PCP - Cardiology (Cardiology)  CHIEF COMPLAINTS/PURPOSE OF CONSULTATION:  Evaluation of pancytopenia  HISTORY OF PRESENTING ILLNESS:  Rebecca Hartman 76 y.o. female is here at the request of her rheumatologist (Dr. Gavin Pound) for evaluation of pancytopenia.  Per medical records sent by Dr. Trudie Reed, patient was noted to have onset of pancytopenia in November 2022.  Due to pancytopenia, methotrexate was held by rheumatologist as of 02/18/2021.  Patient reports she also started Plaquenil around the same time.  She continues to take her Enbrel for her psoriasis and psoriatic arthritis. She is also on folic acid supplement.  Lab trends as follows: Baseline CBC (04/30/2020): WBC 5.3, hemoglobin 13.2/MCV 95, platelets 131 Onset of pancytopenia (02/12/2021): WBC 2.9, hemoglobin 10.9, platelets 95 Repeat CBC after holding MTX (03/12/2021): WBC 3.9, Hgb 10.5/MCV 97, platelets 94 Repeat CBC (04/17/2021): WBC 3.2, Hgb 10.0/MCV 92, platelets 93  Additional pertinent lab work includes: Hepatitis panel including hep A, hep B, HCV (02/12/2021): Negative Iron panel (03/12/2021): Iron saturation 17%, TIBC 328 CMP (03/12/2021): Normal creatinine 0.93, normal bilirubin 0.9  Patient reports that she had unspecified anemia in childhood.  She also had a GI bleed in 2012 due to stomach ulcer and required blood transfusions as well as iron infusions.  She denies any recent medication changes or new medications.  She does take folic acid supplement is home, but does not take any iron or B12 supplements.  She does not have any known history of liver disease, hepatitis, or HIV.  No high risk behaviors for hepatitis or HIV.  She reports that she has daily nosebleeds which she describes as  "large clots coming out of her nose when she sneezes."  She denies any hematemesis, hematochezia, melena, or hematuria.  She admits to easy bruising but denies petechial rash.  She has chronic fatigue related to her fibromyalgia, deconditioning, and history of heart disease.  She reports chronic headaches, palpitations, dyspnea on exertion.  She denies any pica, chest pain, lightheadedness, or syncope.  She has not noticed any new lumps or bumps.  She denies any B symptoms such as fever, chills, night sweats, unintentional weight loss.  She has not had any recent or recurrent infections  She reports 25% energy and 100% appetite.   MEDICAL HISTORY:  Past Medical History:  Diagnosis Date   Anemia    Anxiety    Arthritis    Cataract    Chronic back pain    Coronary artery disease    Multivessel disease, status post DES to mid LAD 05/2020   Essential hypertension    Fibromyalgia    Hiatal hernia    History of blood transfusion    Hypercholesteremia    Hypothyroidism    Osteoporosis    Type 2 diabetes mellitus ([place])     SURGICAL HISTORY: Past Surgical History:  Procedure Laterality Date   ABDOMINAL HYSTERECTOMY     BACK SURGERY     C-EYE SURGERY PROCEDURE     CARPAL TUNNEL RELEASE     CATARACT EXTRACTION     CHOLECYSTECTOMY     COLONOSCOPY  08/06/06 [facility]   CORONARY STENT INTERVENTION  05/24/2020   CORONARY STENT INTERVENTION N/A 05/24/2020   Procedure: CORONARY STENT INTERVENTION;  Surgeon: Burnell Blanks, MD;  Location:  [facility];  Service: Cardiovascular;  Laterality: N/A;   DENTAL SURGERY  04/2015   reconstructive surgery lower   ESOPHAGOGASTRODUODENOSCOPY  10/31/2010   Procedure: ESOPHAGOGASTRODUODENOSCOPY (EGD);  Surgeon: [name], MD;  [facility];  Service: Endoscopy;  Laterality: N/A;   ESOPHAGOGASTRODUODENOSCOPY  03/05/2011   Procedure: ESOPHAGOGASTRODUODENOSCOPY (EGD);  Surgeon: [name], MD;  [facility];   Service: Endoscopy;  Laterality: N/A;  12:00   ESOPHAGOGASTRODUODENOSCOPY  04/28/2012   Procedure: ESOPHAGOGASTRODUODENOSCOPY (EGD);  Surgeon: [name], MD;  [facility];  Service: Endoscopy;  Laterality: N/A;  140-moved to 10:30 Ann notified pt   EYE SURGERY     INTRAVASCULAR IMAGING/OCT N/A 05/24/2020   Procedure: INTRAVASCULAR IMAGING/OCT;  Surgeon: Burnell Blanks, MD;  Location: [facility];  Service: Cardiovascular;  Laterality: N/A;   INTRAVASCULAR PRESSURE WIRE/FFR STUDY N/A 05/24/2020   Procedure: INTRAVASCULAR PRESSURE WIRE/FFR STUDY;  Surgeon: Burnell Blanks, MD;  Location: [facility];  Service: Cardiovascular;  Laterality: N/A;   LEFT HEART CATH AND CORONARY ANGIOGRAPHY N/A 05/24/2020   Procedure: LEFT HEART CATH AND CORONARY ANGIOGRAPHY;  Surgeon: Burnell Blanks, MD;  Location: [facility];  Service: Cardiovascular;  Laterality: N/A;   TONSILLECTOMY     TOTAL HIP REVISION     UPPER GASTROINTESTINAL ENDOSCOPY  10/03/2010   EGD DILATN PYLORIC CHANNEL   UPPER GASTROINTESTINAL ENDOSCOPY  07/08/2010   UPPER GASTROINTESTINAL ENDOSCOPY  08/06/06   FIELDS   UPPER GASTROINTESTINAL ENDOSCOPY  06/29/2008   EGD    SOCIAL HISTORY: Social History   Socioeconomic History   Marital status: Married    Spouse name: Not on file   Number of children: Not on file   Years of education: Not on file   Highest education level: Not on file  Occupational History   Occupation: retired  Tobacco Use   Smoking status: Never   Smokeless tobacco: Never  Vaping Use   Vaping Use: Never used  Substance and Sexual Activity   Alcohol use: No    Alcohol/week: 0.0 standard drinks   Drug use: No   Sexual activity: Yes    Birth control/protection: Post-menopausal  Other Topics Concern   Not on file  Social History Narrative   Not on file   Social Determinants of Health   Financial Resource Strain: Not on file  Food Insecurity: Not  on file  Transportation Needs: Not on file  Physical Activity: Not on file  Stress: Not on file  Social Connections: Not on file  Intimate Partner Violence: Not on file    FAMILY HISTORY: Family History  Problem Relation Age of Onset   Diabetes Mother    Stroke Mother    Hyperlipidemia Mother    Hypertension Mother    Stroke Father    Heart disease Father    Hyperlipidemia Father    Hypertension Father    Healthy Son     ALLERGIES:  is allergic to shellfish allergy, tussigon [hydrocodone bit-homatrop mbr], hydrocodone, glyburide, levofloxacin, lisinopril, morphine, and pantoprazole sodium [pantoprazole sodium].  MEDICATIONS:  Current Outpatient Medications  Medication Sig Dispense Refill   acetaminophen (TYLENOL) 500 MG tablet Take 500-1,000 mg by mouth every 6 (six) hours as needed (pain).     alum & mag hydroxide-simeth (MAALOX/MYLANTA) 200-200-20 MG/5ML suspension Take 15-30 mLs by mouth every 6 (six) hours as needed for indigestion or heartburn.     atorvastatin (LIPITOR) 40 MG tablet Take 1 tablet (  40 mg total) by mouth daily. 90 tablet 3   Cholecalciferol (VITAMIN D3 PO) Take 1 tablet by mouth daily.     clopidogrel (PLAVIX) 75 MG tablet Take 1 tablet (75 mg total) by mouth daily with breakfast. 90 tablet 3   empagliflozin (JARDIANCE) 10 MG TABS tablet Take 10 mg by mouth daily.     etanercept (ENBREL) 50 MG/ML injection Inject 50 mg into the skin every Monday.     fluconazole (DIFLUCAN) 100 MG tablet Take 100 mg by mouth daily as needed (yeast).     folic acid (FOLVITE) 1 MG tablet Take 1 mg by mouth every evening.     isosorbide mononitrate (IMDUR) 30 MG 24 hr tablet TAKE 1 TABLET BY MOUTH ONCE DAILY. 90 tablet 3   JARDIANCE 25 MG TABS tablet Take 25 mg by mouth daily.     levothyroxine (SYNTHROID, LEVOTHROID) 50 MCG tablet Take 50 mcg by mouth daily.     metoprolol tartrate (LOPRESSOR) 50 MG tablet TAKE (1) TABLET BY MOUTH TWICE DAILY. (Patient taking differently: Take  50 mg by mouth 2 (two) times daily.) 180 tablet 0   nitroGLYCERIN (NITROSTAT) 0.4 MG SL tablet PLACE 1 TAB UNDER TONGUE EVERY 5 MIN IF NEEDED FOR CHEST PAIN. MAY USE 3 TIMES.NO RELIEF CALL 911. (Patient taking differently: Place 0.4 mg under the tongue every 5 (five) minutes x 3 doses as needed.) 25 tablet 1   Propylene Glycol (SYSTANE BALANCE OP) Apply 2 drops to eye 3 (three) times daily as needed (dry/irritated eyes).     pseudoephedrine-guaifenesin (MUCINEX D) 60-600 MG per tablet Take 1 tablet by mouth every 12 (twelve) hours as needed for congestion.     temazepam (RESTORIL) 30 MG capsule Take 30 mg by mouth at bedtime.     valsartan (DIOVAN) 80 MG tablet Take 80 mg by mouth 2 (two) times daily.     VITAMIN A PO Take 1 tablet by mouth daily.     diltiazem (CARDIZEM CD) 120 MG 24 hr capsule Take 1 capsule (120 mg total) by mouth daily. 90 capsule 3   Current Facility-Administered Medications  Medication Dose Route Frequency Provider Last Rate Last Admin   sodium chloride flush (NS) 0.9 % injection 3 mL  3 mL Intravenous Q12H Satira Sark, MD        REVIEW OF SYSTEMS:   Review of Systems  Constitutional:  Positive for fatigue. Negative for appetite change, chills, diaphoresis, fever and unexpected weight change.  HENT:   Positive for nosebleeds. Negative for lump/mass.   Eyes:  Negative for eye problems.  Respiratory:  Positive for shortness of breath (with exertion). Negative for cough and hemoptysis.   Cardiovascular:  Negative for chest pain, leg swelling and palpitations.  Gastrointestinal:  Negative for abdominal pain, blood in stool, constipation, diarrhea, nausea and vomiting.  Genitourinary:  Negative for hematuria.   Musculoskeletal:  Positive for arthralgias.  Skin: Negative.   Neurological:  Positive for numbness. Negative for dizziness, headaches and light-headedness.  Hematological:  Bruises/bleeds easily (bruising).  Psychiatric/Behavioral:  Positive for sleep  disturbance.      PHYSICAL EXAMINATION: ECOG PERFORMANCE STATUS: 1 - Symptomatic but completely ambulatory  Vitals:   05/14/21 0854  BP: (!) 148/63  Pulse: (!) 59  Resp: 18  Temp: 98.1 F (36.7 C)  SpO2: 99%   Filed Weights   05/14/21 0854  Weight: 162 lb 3.2 oz (73.6 kg)    Physical Exam Constitutional:      Appearance: Normal appearance.  She is obese.  HENT:     Head: Normocephalic and atraumatic.     Mouth/Throat:     Mouth: Mucous membranes are moist.  Eyes:     Extraocular Movements: Extraocular movements intact.     Pupils: Pupils are equal, round, and reactive to light.  Cardiovascular:     Rate and Rhythm: Normal rate and regular rhythm.     Pulses: Normal pulses.     Heart sounds: Normal heart sounds.  Pulmonary:     Effort: Pulmonary effort is normal.     Breath sounds: Normal breath sounds.  Abdominal:     General: Bowel sounds are normal.     Palpations: Abdomen is soft.     Tenderness: There is no abdominal tenderness.  Musculoskeletal:        General: No swelling.     Right lower leg: No edema.     Left lower leg: No edema.  Lymphadenopathy:     Cervical: No cervical adenopathy.  Skin:    General: Skin is warm and dry.  Neurological:     General: No focal deficit present.     Mental Status: She is alert and oriented to person, place, and time.  Psychiatric:        Mood and Affect: Mood normal.        Behavior: Behavior normal.      LABORATORY DATA:  I have reviewed the data as listed Recent Results (from the past 2160 hour(s))  Comprehensive metabolic panel     Status: Abnormal   Collection Time: 05/14/21 10:39 AM  Result Value Ref Range   Sodium 137 135 - 145 mmol/L   Potassium 3.8 3.5 - 5.1 mmol/L   Chloride 107 98 - 111 mmol/L   CO2 19 (L) 22 - 32 mmol/L   Glucose, Bld 158 (H) 70 - 99 mg/dL    Comment: Glucose reference range applies only to samples taken after fasting for at least 8 hours.   BUN 10 8 - 23 mg/dL   Creatinine, Ser  0.79 0.44 - 1.00 mg/dL   Calcium 8.5 (L) 8.9 - 10.3 mg/dL   Total Protein 6.0 (L) 6.5 - 8.1 g/dL   Albumin 3.4 (L) 3.5 - 5.0 g/dL   AST 41 15 - 41 U/L   ALT 20 0 - 44 U/L   Alkaline Phosphatase 89 38 - 126 U/L   Total Bilirubin 1.1 0.3 - 1.2 mg/dL   GFR, Estimated >60 >60 mL/min    Comment: (NOTE) Calculated using the CKD-EPI Creatinine Equation (2021)    Anion gap 11 5 - 15    Comment: Performed at [facility], [address] [place], [place] [address]  Lactate dehydrogenase     Status: Abnormal   Collection Time: 05/14/21 10:39 AM  Result Value Ref Range   LDH 194 (H) 98 - 192 U/L    Comment: Performed at [facility], [address], [place], [place] [address]  Reticulocytes     Status: Abnormal   Collection Time: 05/14/21 10:39 AM  Result Value Ref Range   Retic Ct Pct 1.6 0.4 - 3.1 %   RBC. 3.64 (L) 3.87 - 5.11 MIL/uL   Retic Count, Absolute 56.8 19.0 - 186.0 K/uL   Immature Retic Fract 7.7 2.3 - 15.9 %    Comment: Performed at [facility], [address], [place], [place] [address]    RADIOGRAPHIC STUDIES: I have personally reviewed the radiological images as listed and agreed with the findings in  the report. No results found.  ASSESSMENT & PLAN: 1.  Pancytopenia - Seen at the request of rheumatologist (Dr. Gavin Pound) for evaluation of pancytopenia. - Per medical records sent by Dr. Trudie Reed, patient was noted to have onset of pancytopenia in November 2022. - She has been on methotrexate, Plaquenil, and Enbrel for psoriatic arthritis.  She is also on folic acid supplement. - Due to pancytopenia, methotrexate and Plaquenil were held by rheumatologist as of 02/18/2021.  She continues to take her Enbrel. - Lab trends as follows: Baseline CBC (04/30/2020): WBC 5.3, hemoglobin 13.2/MCV 95, platelets 131 Onset of pancytopenia (02/12/2021): WBC 2.9, hemoglobin 10.9, platelets 95 Repeat CBC after holding MTX (03/12/2021): WBC 3.9, Hgb 10.5/MCV 97, platelets 94 Repeat CBC  (04/17/2021): WBC 3.2, Hgb 10.0/MCV 92, platelets 93 - Additional pertinent lab work includes: Hepatitis panel including hep A, hep B, HCV (02/12/2021): Negative Iron panel (03/12/2021): Iron saturation 17%, TIBC 328 CMP (03/12/2021): Normal creatinine 0.93, normal bilirubin 0.9 - History of GI bleed in 2012 due to stomach ulcer, required blood transfusions and iron infusions. - She takes folic acid supplement at home, she does not take any iron or B12. - Reports daily nosebleeds described as "large clots coming out of her nose when she sneezes" - No bright red blood per rectum, melena, or hematuria. - No B symptoms or frequent infections. - No lymphadenopathy or splenomegaly palpated on exam. - Wide differential diagnosis at this time -- would consider that she may have a single cause of pancytopenia versus various causes of individual single line cytopenias (i.e. anemia of chronic disease, immune thrombocytopenia, etc.).  Although there was no significant improvement in her pancytopenia after methotrexate was held, her Plaquenil and Enbrel also have the potential to cause myelosuppression and pancytopenia. - PLAN: Labs today for extensive evaluation of pancytopenia. Repeat CBC, CMP, LDH, reticulocytes. Nutritional panel including iron/ferritin, B12/methylmalonic acid, folate, copper Myeloma panel (SPEP, IFE, light chains) Stool cards x 3 Flow cytometry, pathologist smear review - RTC in 3 weeks to discuss results. - Referral to ENT due to daily nosebleeds - If laboratory work-up is unremarkable, we will discuss with rheumatology regarding trial of holding her Enbrel to see if this would improve her blood counts. - We would also consider CT CAP to evaluate for any lymphadenopathy caused by lymphoproliferative disorders, since these are known to have higher incidence in patients with underlying rheumatoid disorders. - We would also consider bone marrow biopsy, pending above work-up.  2.  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  No tobacco, alcohol, or illicit drug use. - FAMILY: Mother had strokes.  Father had heart problems. Brother had prostate cancer.   PLAN SUMMARY & DISPOSITION: Labs today (including stool cards x3) Referral to ENT for daily nosebleeds RTC in 3 weeks to discuss results and next steps  All questions were answered. The patient knows to call the clinic with any problems, questions or concerns.   Medical decision making: Moderate  Time spent on visit: I spent 40 minutes counseling the patient face to face. The total time spent in the appointment was 55 minutes and more than 50% was on counseling.  I, Tarri Abernethy PA-C, have seen this patient in conjunction with Dr. Derek Jack.  Greater than 50% of visit was performed by Dr. Delton Coombes.   Derek Jack, MD 05/14/2021 10:10 AM  DR.  Laia Wiley: I have independently evaluated her and formulated my assessment and plan.  I agree with HPI, assessment and plan written by Casey Burkitt, PA-C.  She has cytopenias with leukopenia and thrombocytopenia since late last year.  She was on methotrexate for inflammatory arthritis for the past few years, which was discontinued in November 2022 due to cytopenias.  Hydroxychloroquine was also discontinued during summer 2022.  She has been receiving Enbrel which is controlling her symptoms fairly well.  She is taking folic acid daily.  She complains of severe fatigue since she had MI around 05/24/2020.  We will do nutritional deficiency work-up today.  Would also recommend a flow cytometry to evaluate for LGL process.  She does not report any weight loss.  As there is high risk for lymphoproliferative disorders in patients with connective tissue disorders and the  treatments, I would be at low threshold to consider doing a CT scan of the abdomen and pelvis if there is no cause for her severe fatigue.

## 2021-05-14 ENCOUNTER — Encounter (HOSPITAL_COMMUNITY): Payer: Self-pay | Admitting: Hematology

## 2021-05-14 ENCOUNTER — Inpatient Hospital Stay (HOSPITAL_COMMUNITY): Payer: HMO

## 2021-05-14 ENCOUNTER — Other Ambulatory Visit: Payer: Self-pay

## 2021-05-14 ENCOUNTER — Inpatient Hospital Stay (HOSPITAL_COMMUNITY): Payer: HMO | Attending: Hematology | Admitting: Hematology

## 2021-05-14 VITALS — BP 148/63 | HR 59 | Temp 98.1°F | Resp 18 | Ht 63.0 in | Wt 162.2 lb

## 2021-05-14 DIAGNOSIS — D61818 Other pancytopenia: Secondary | ICD-10-CM

## 2021-05-14 DIAGNOSIS — I10 Essential (primary) hypertension: Secondary | ICD-10-CM | POA: Diagnosis not present

## 2021-05-14 DIAGNOSIS — E1136 Type 2 diabetes mellitus with diabetic cataract: Secondary | ICD-10-CM | POA: Insufficient documentation

## 2021-05-14 DIAGNOSIS — Z7984 Long term (current) use of oral hypoglycemic drugs: Secondary | ICD-10-CM | POA: Insufficient documentation

## 2021-05-14 DIAGNOSIS — L4050 Arthropathic psoriasis, unspecified: Secondary | ICD-10-CM | POA: Diagnosis not present

## 2021-05-14 DIAGNOSIS — Z8249 Family history of ischemic heart disease and other diseases of the circulatory system: Secondary | ICD-10-CM | POA: Insufficient documentation

## 2021-05-14 DIAGNOSIS — Z90710 Acquired absence of both cervix and uterus: Secondary | ICD-10-CM | POA: Diagnosis not present

## 2021-05-14 DIAGNOSIS — R5383 Other fatigue: Secondary | ICD-10-CM | POA: Diagnosis not present

## 2021-05-14 LAB — COMPREHENSIVE METABOLIC PANEL
ALT: 20 U/L (ref 0–44)
AST: 41 U/L (ref 15–41)
Albumin: 3.4 g/dL — ABNORMAL LOW (ref 3.5–5.0)
Alkaline Phosphatase: 89 U/L (ref 38–126)
Anion gap: 11 (ref 5–15)
BUN: 10 mg/dL (ref 8–23)
CO2: 19 mmol/L — ABNORMAL LOW (ref 22–32)
Calcium: 8.5 mg/dL — ABNORMAL LOW (ref 8.9–10.3)
Chloride: 107 mmol/L (ref 98–111)
Creatinine, Ser: 0.79 mg/dL (ref 0.44–1.00)
GFR, Estimated: >60 mL/min (ref >60)
Glucose, Bld: 158 mg/dL — ABNORMAL HIGH (ref 70–99)
Potassium: 3.8 mmol/L (ref 3.5–5.1)
Sodium: 137 mmol/L (ref 135–145)
Total Bilirubin: 1.1 mg/dL (ref 0.3–1.2)
Total Protein: 6 g/dL — ABNORMAL LOW (ref 6.5–8.1)

## 2021-05-14 LAB — IRON AND TIBC
Iron: 99 ug/dL (ref 28–170)
Saturation Ratios: 26 % (ref 10.4–31.8)
TIBC: 382 ug/dL (ref 250–450)
UIBC: 283 ug/dL

## 2021-05-14 LAB — RETICULOCYTES
Immature Retic Fract: 7.7 % (ref 2.3–15.9)
RBC.: 3.64 MIL/uL — ABNORMAL LOW (ref 3.87–5.11)
Retic Count, Absolute: 56.8 10*3/uL (ref 19.0–186.0)
Retic Ct Pct: 1.6 % (ref 0.4–3.1)

## 2021-05-14 LAB — CBC WITH DIFFERENTIAL/PLATELET
Abs Immature Granulocytes: 0.01 10*3/uL (ref 0.00–0.07)
Basophils Absolute: 0 10*3/uL (ref 0.0–0.1)
Basophils Relative: 1 %
Eosinophils Absolute: 0.3 10*3/uL (ref 0.0–0.5)
Eosinophils Relative: 7 %
HCT: 35.1 % — ABNORMAL LOW (ref 36.0–46.0)
Hemoglobin: 11.5 g/dL — ABNORMAL LOW (ref 12.0–15.0)
Immature Granulocytes: 0 %
Lymphocytes Relative: 25 %
Lymphs Abs: 1 10*3/uL (ref 0.7–4.0)
MCH: 32 pg (ref 26.0–34.0)
MCHC: 32.8 g/dL (ref 30.0–36.0)
MCV: 97.8 fL (ref 80.0–100.0)
Monocytes Absolute: 0.4 10*3/uL (ref 0.1–1.0)
Monocytes Relative: 9 %
Neutro Abs: 2.2 10*3/uL (ref 1.7–7.7)
Neutrophils Relative %: 58 %
Platelets: 111 10*3/uL — ABNORMAL LOW (ref 150–400)
RBC: 3.59 MIL/uL — ABNORMAL LOW (ref 3.87–5.11)
RDW: 15.2 % (ref 11.5–15.5)
WBC: 3.9 10*3/uL — ABNORMAL LOW (ref 4.0–10.5)
nRBC: 0 % (ref 0.0–0.2)

## 2021-05-14 LAB — LACTATE DEHYDROGENASE: LDH: 194 U/L — ABNORMAL HIGH (ref 98–192)

## 2021-05-14 LAB — FERRITIN: Ferritin: 38 ng/mL (ref 11–307)

## 2021-05-14 LAB — FOLATE: Folate: 37 ng/mL (ref >5.9)

## 2021-05-14 LAB — VITAMIN B12: Vitamin B-12: 295 pg/mL (ref 180–914)

## 2021-05-14 NOTE — Patient Instructions (Signed)
[facility] at [facility] Discharge Instructions  You were seen today by Dr. Delton Coombes & Tarri Abernethy PA-C for your pancytopenia (low blood counts).  As we discussed, there are many different things that could cause this.  Some of the possible causes include medication side effect, vitamin or mineral deficiencies, or certain bone marrow disorders.  We will check several labs today to help [place] determine the cause of your low blood counts.  We will also REFER YOU TO ENT (Ear Nose and Throat doctor) due to your frequent nosebleeds.  We will check stool cards x3 to see if you are losing any blood in your bowel movements.  LABS: Check labs today on the first floor of the hospital  FOLLOW-UP APPOINTMENT: Office visit in 3 weeks to discuss results and next steps   Thank you for choosing [facility] at [facility] to provide your oncology and hematology care.  To afford each patient quality time with our provider, please arrive at least 15 minutes before your scheduled appointment time.   If you have a lab appointment with the [facility] please come in thru the Main Entrance and check in at the main information desk.  You need to re-schedule your appointment should you arrive 10 or more minutes late.  We strive to give you quality time with our providers, and arriving late affects you and other patients whose appointments are after yours.  Also, if you no show three or more times for appointments you may be dismissed from the clinic at the providers discretion.     Again, thank you for choosing [facility].  Our hope is that these requests will decrease the amount of time that you wait before being seen by our physicians.       _____________________________________________________________  Should you have questions after your visit to [facility], please contact our office at [phone] and follow the prompts.  Our  office hours are 8:00 a.m. and 4:30 p.m. Monday - Friday.  Please note that voicemails left after 4:00 p.m. may not be returned until the following business day.  We are closed weekends and major holidays.  You do have access to a nurse 24-7, just call the main number to the clinic [phone] and do not press any options, hold on the line and a nurse will answer the phone.    For prescription refill requests, have your pharmacy contact our office and allow 72 hours.    Due to Covid, you will need to wear a mask upon entering the hospital. If you do not have a mask, a mask will be given to you at the Main Entrance upon arrival. For doctor visits, patients may have 1 support person age 93 or older with them. For treatment visits, patients can not have anyone with them due to social distancing guidelines and our immunocompromised population.

## 2021-05-15 DIAGNOSIS — M797 Fibromyalgia: Secondary | ICD-10-CM | POA: Diagnosis not present

## 2021-05-15 DIAGNOSIS — M2550 Pain in unspecified joint: Secondary | ICD-10-CM | POA: Diagnosis not present

## 2021-05-15 DIAGNOSIS — M3500 Sicca syndrome, unspecified: Secondary | ICD-10-CM | POA: Diagnosis not present

## 2021-05-15 DIAGNOSIS — D61818 Other pancytopenia: Secondary | ICD-10-CM | POA: Diagnosis not present

## 2021-05-15 DIAGNOSIS — Z79899 Other long term (current) drug therapy: Secondary | ICD-10-CM | POA: Diagnosis not present

## 2021-05-15 DIAGNOSIS — L4050 Arthropathic psoriasis, unspecified: Secondary | ICD-10-CM | POA: Diagnosis not present

## 2021-05-15 DIAGNOSIS — M150 Primary generalized (osteo)arthritis: Secondary | ICD-10-CM | POA: Diagnosis not present

## 2021-05-15 LAB — SURGICAL PATHOLOGY

## 2021-05-15 LAB — PROTEIN ELECTROPHORESIS, SERUM
A/G Ratio: 1.2 (ref 0.7–1.7)
Albumin ELP: 3.3 g/dL (ref 2.9–4.4)
Alpha-1-Globulin: 0.3 g/dL (ref 0.0–0.4)
Alpha-2-Globulin: 0.5 g/dL (ref 0.4–1.0)
Beta Globulin: 0.9 g/dL (ref 0.7–1.3)
Gamma Globulin: 1 g/dL (ref 0.4–1.8)
Globulin, Total: 2.7 g/dL (ref 2.2–3.9)
Total Protein ELP: 6 g/dL (ref 6.0–8.5)

## 2021-05-15 LAB — FLOW CYTOMETRY

## 2021-05-15 LAB — KAPPA/LAMBDA LIGHT CHAINS
Kappa free light chain: 51.7 mg/L — ABNORMAL HIGH (ref 3.3–19.4)
Kappa, lambda light chain ratio: 1.39 (ref 0.26–1.65)
Lambda free light chains: 37.3 mg/L — ABNORMAL HIGH (ref 5.7–26.3)

## 2021-05-16 LAB — PATHOLOGIST SMEAR REVIEW

## 2021-05-17 LAB — IMMUNOFIXATION ELECTROPHORESIS
IgA: 355 mg/dL (ref 64–422)
IgG (Immunoglobin G), Serum: 865 mg/dL (ref 586–1602)
IgM (Immunoglobulin M), Srm: 220 mg/dL — ABNORMAL HIGH (ref 26–217)
Total Protein ELP: 6.3 g/dL (ref 6.0–8.5)

## 2021-05-17 LAB — COPPER, SERUM: Copper: 89 ug/dL (ref 80–158)

## 2021-05-18 DIAGNOSIS — D61818 Other pancytopenia: Secondary | ICD-10-CM | POA: Diagnosis not present

## 2021-05-19 DIAGNOSIS — D61818 Other pancytopenia: Secondary | ICD-10-CM | POA: Diagnosis not present

## 2021-05-20 ENCOUNTER — Other Ambulatory Visit (HOSPITAL_COMMUNITY): Payer: Self-pay

## 2021-05-20 DIAGNOSIS — D61818 Other pancytopenia: Secondary | ICD-10-CM

## 2021-05-20 LAB — OCCULT BLOOD X 1 CARD TO LAB, STOOL
Fecal Occult Bld: POSITIVE — AB
Fecal Occult Bld: POSITIVE — AB
Fecal Occult Bld: POSITIVE — AB

## 2021-05-23 LAB — METHYLMALONIC ACID, SERUM: Methylmalonic Acid, Quantitative: 938 nmol/L — ABNORMAL HIGH (ref 0–378)

## 2021-06-03 NOTE — Progress Notes (Signed)
Rebecca Hartman, Rebecca Hartman [id number]   CLINIC:  Medical Oncology/Hematology  PCP:  Celene Squibb, MD [place] [address] [phone]   REASON FOR VISIT:  Follow-up for pancytopenia  PRIOR THERAPY: None  CURRENT THERAPY: Under work-up  INTERVAL HISTORY:  Rebecca Hartman 76 y.o. female returns for routine follow-up of her pancytopenia.  She was seen for initial consultation by Dr. Delton Coombes and Tarri Abernethy PA-C on 05/14/2021.  At today's visit, she continues to report significant chronic fatigue.  She denies any changes or new complaints since her last visit. She continues to have daily epistaxis associated with excessive sneezing.  Nosebleeds are more frequently from her right nostril than her left.  She reports that she has "large clots coming out of her nose when she sneezes."    She denies any hematemesis, hematochezia, melena, or hematuria.   She admits to easy bruising but denies petechial rash.   She reports chronic headaches, palpitations, dyspnea on exertion. She denies any pica, chest pain, lightheadedness, or syncope. She has not noticed any new lumps or bumps.  She denies any B symptoms such as fever, chills, night sweats, unintentional weight loss.  She has not had any recent or recurrent infections.  She saw rheumatology on 05/15/2021, and Dr. Trudie Reed recommended a trial of holding Enbrel.  Since that time, patient has had some recurrence of her psoriatic rash.  She has 25% energy and 80% appetite. She endorses that she is maintaining a stable weight.   REVIEW OF SYSTEMS:  Review of Systems  Constitutional:  Positive for fatigue. Negative for appetite change, chills, diaphoresis, fever and unexpected weight change.  HENT:   Positive for trouble swallowing (Difficulty chewing with dentures). Negative for lump/mass and nosebleeds.   Eyes:  Negative for eye problems.  Respiratory:  Negative for cough, hemoptysis and  shortness of breath.   Cardiovascular:  Positive for chest pain (Occasional, none today) and palpitations. Negative for leg swelling.  Gastrointestinal:  Negative for abdominal pain, blood in stool, constipation, diarrhea, nausea and vomiting.  Genitourinary:  Negative for hematuria.   Skin:  Positive for rash (Psoriatic rash of right knee).  Neurological:  Positive for numbness. Negative for dizziness, headaches and light-headedness.  Hematological:  Does not bruise/bleed easily.  Psychiatric/Behavioral:  Positive for depression. The patient is nervous/anxious.      PAST MEDICAL/SURGICAL HISTORY:  Past Medical History:  Diagnosis Date   Anemia    Anxiety    Arthritis    Cataract    Chronic back pain    Coronary artery disease    Multivessel disease, status post DES to mid LAD 05/2020   Essential hypertension    Fibromyalgia    Hiatal hernia    History of blood transfusion    Hypercholesteremia    Hypothyroidism    Osteoporosis    Type 2 diabetes mellitus ([place])    Past Surgical History:  Procedure Laterality Date   ABDOMINAL HYSTERECTOMY     BACK SURGERY     C-EYE SURGERY PROCEDURE     CARPAL TUNNEL RELEASE     CATARACT EXTRACTION     CHOLECYSTECTOMY     COLONOSCOPY  08/06/06 [facility]   CORONARY STENT INTERVENTION  05/24/2020   CORONARY STENT INTERVENTION N/A 05/24/2020   Procedure: CORONARY STENT INTERVENTION;  Surgeon: Burnell Blanks, MD;  Location: [facility];  Service: Cardiovascular;  Laterality: N/A;   DENTAL SURGERY  04/2015  reconstructive surgery lower   ESOPHAGOGASTRODUODENOSCOPY  10/31/2010   Procedure: ESOPHAGOGASTRODUODENOSCOPY (EGD);  Surgeon: [name], MD;  [facility];  Service: Endoscopy;  Laterality: N/A;   ESOPHAGOGASTRODUODENOSCOPY  03/05/2011   Procedure: ESOPHAGOGASTRODUODENOSCOPY (EGD);  Surgeon: [name], MD;  [facility];  Service: Endoscopy;  Laterality: N/A;  12:00   ESOPHAGOGASTRODUODENOSCOPY   04/28/2012   Procedure: ESOPHAGOGASTRODUODENOSCOPY (EGD);  Surgeon: [name], MD;  [facility];  Service: Endoscopy;  Laterality: N/A;  140-moved to 10:30 Ann notified pt   EYE SURGERY     INTRAVASCULAR IMAGING/OCT N/A 05/24/2020   Procedure: INTRAVASCULAR IMAGING/OCT;  Surgeon: Burnell Blanks, MD;  Location: [facility];  Service: Cardiovascular;  Laterality: N/A;   INTRAVASCULAR PRESSURE WIRE/FFR STUDY N/A 05/24/2020   Procedure: INTRAVASCULAR PRESSURE WIRE/FFR STUDY;  Surgeon: Burnell Blanks, MD;  Location: [facility];  Service: Cardiovascular;  Laterality: N/A;   LEFT HEART CATH AND CORONARY ANGIOGRAPHY N/A 05/24/2020   Procedure: LEFT HEART CATH AND CORONARY ANGIOGRAPHY;  Surgeon: Burnell Blanks, MD;  Location: [facility];  Service: Cardiovascular;  Laterality: N/A;   TONSILLECTOMY     TOTAL HIP REVISION     UPPER GASTROINTESTINAL ENDOSCOPY  10/03/2010   EGD DILATN PYLORIC CHANNEL   UPPER GASTROINTESTINAL ENDOSCOPY  07/08/2010   UPPER GASTROINTESTINAL ENDOSCOPY  08/06/06   [facility]   UPPER GASTROINTESTINAL ENDOSCOPY  06/29/2008   EGD     SOCIAL HISTORY:  Social History   Socioeconomic History   Marital status: Married    Spouse name: Not on file   Number of children: Not on file   Years of education: Not on file   Highest education level: Not on file  Occupational History   Occupation: retired  Tobacco Use   Smoking status: Never   Smokeless tobacco: Never  Vaping Use   Vaping Use: Never used  Substance and Sexual Activity   Alcohol use: No    Alcohol/week: 0.0 standard drinks   Drug use: No   Sexual activity: Yes    Birth control/protection: Post-menopausal  Other Topics Concern   Not on file  Social History Narrative   Not on file   Social Determinants of Health   Financial Resource Strain: Not on file  Food Insecurity: Not on file  Transportation Needs: Not on file  Physical Activity: Not on  file  Stress: Not on file  Social Connections: Not on file  Intimate Partner Violence: Not on file    FAMILY HISTORY:  Family History  Problem Relation Age of Onset   Diabetes Mother    Stroke Mother    Hyperlipidemia Mother    Hypertension Mother    Stroke Father    Heart disease Father    Hyperlipidemia Father    Hypertension Father    Healthy Son     CURRENT MEDICATIONS:  Outpatient Encounter Medications as of 06/04/2021  Medication Sig Note   acetaminophen (TYLENOL) 500 MG tablet Take 500-1,000 mg by mouth every 6 (six) hours as needed (pain).    alum & mag hydroxide-simeth (MAALOX/MYLANTA) 200-200-20 MG/5ML suspension Take 15-30 mLs by mouth every 6 (six) hours as needed for indigestion or heartburn.    atorvastatin (LIPITOR) 40 MG tablet Take 1 tablet (40 mg total) by mouth daily.    Cholecalciferol (VITAMIN D3 PO) Take 1 tablet by mouth daily.    clopidogrel (PLAVIX) 75 MG tablet Take 1 tablet (75 mg total) by mouth daily  with breakfast.    diltiazem (CARDIZEM CD) 120 MG 24 hr capsule Take 1 capsule (120 mg total) by mouth daily.    empagliflozin (JARDIANCE) 10 MG TABS tablet Take 10 mg by mouth daily.    etanercept (ENBREL) 50 MG/ML injection Inject 50 mg into the skin every Monday.    fluconazole (DIFLUCAN) 100 MG tablet Take 100 mg by mouth daily as needed (yeast).    folic acid (FOLVITE) 1 MG tablet Take 1 mg by mouth every evening.    isosorbide mononitrate (IMDUR) 30 MG 24 hr tablet TAKE 1 TABLET BY MOUTH ONCE DAILY.    JARDIANCE 25 MG TABS tablet Take 25 mg by mouth daily.    levothyroxine (SYNTHROID, LEVOTHROID) 50 MCG tablet Take 50 mcg by mouth daily.    metoprolol tartrate (LOPRESSOR) 50 MG tablet TAKE (1) TABLET BY MOUTH TWICE DAILY. (Patient taking differently: Take 50 mg by mouth 2 (two) times daily.)    nitroGLYCERIN (NITROSTAT) 0.4 MG SL tablet PLACE 1 TAB UNDER TONGUE EVERY 5 MIN IF NEEDED FOR CHEST PAIN. MAY USE 3 TIMES.NO RELIEF CALL 911. (Patient taking  differently: Place 0.4 mg under the tongue every 5 (five) minutes x 3 doses as needed.) 05/24/2020: Took 1    Propylene Glycol (SYSTANE BALANCE OP) Apply 2 drops to eye 3 (three) times daily as needed (dry/irritated eyes).    pseudoephedrine-guaifenesin (MUCINEX D) 60-600 MG per tablet Take 1 tablet by mouth every 12 (twelve) hours as needed for congestion.    temazepam (RESTORIL) 30 MG capsule Take 30 mg by mouth at bedtime.    valsartan (DIOVAN) 80 MG tablet Take 80 mg by mouth 2 (two) times daily.    VITAMIN A PO Take 1 tablet by mouth daily.    Facility-Administered Encounter Medications as of 06/04/2021  Medication   sodium chloride flush (NS) 0.9 % injection 3 mL    ALLERGIES:  Allergies  Allergen Reactions   Shellfish Allergy Itching and Nausea And Vomiting   Tussigon [Hydrocodone Bit-Homatrop Mbr]     Per patient's husband , this medication caused confusion.   Hydrocodone Itching   Glyburide     Sudden Drop in Blood Sugars   Levofloxacin Itching   Lisinopril Other (See Comments)    SEVERE DROP IN PRESSURE   Morphine Itching and Nausea Only   Pantoprazole Sodium [Pantoprazole Sodium] Nausea And Vomiting    PATIENT IS UNSURE OF REACTION     PHYSICAL EXAM:  ECOG PERFORMANCE STATUS: 2 - Symptomatic, <50% confined to bed  There were no vitals filed for this visit. There were no vitals filed for this visit. Physical Exam Constitutional:      Appearance: Normal appearance. She is obese.  HENT:     Head: Normocephalic and atraumatic.     Nose:     Comments: Scant epistaxis noted during exam    Mouth/Throat:     Mouth: Mucous membranes are moist.  Eyes:     Extraocular Movements: Extraocular movements intact.     Pupils: Pupils are equal, round, and reactive to light.  Cardiovascular:     Rate and Rhythm: Normal rate and regular rhythm.     Pulses: Normal pulses.     Heart sounds: Normal heart sounds.  Pulmonary:     Effort: Pulmonary effort is normal.     Breath  sounds: Normal breath sounds.  Abdominal:     General: Bowel sounds are normal.     Palpations: Abdomen is soft.     Tenderness:  There is no abdominal tenderness.  Musculoskeletal:        General: No swelling.     Right lower leg: No edema.     Left lower leg: No edema.  Lymphadenopathy:     Cervical: No cervical adenopathy.  Skin:    General: Skin is warm and dry.     Findings: Rash (Psoriatic rash on lateral aspect of right knee) present.  Neurological:     General: No focal deficit present.     Mental Status: She is alert and oriented to person, place, and time.  Psychiatric:        Mood and Affect: Mood normal.        Behavior: Behavior normal.     LABORATORY DATA:  I have reviewed the labs as listed.  CBC    Component Value Date/Time   WBC 3.9 (L) 05/14/2021 1039   RBC 3.59 (L) 05/14/2021 1039   RBC 3.64 (L) 05/14/2021 1039   HGB 11.5 (L) 05/14/2021 1039   HCT 35.1 (L) 05/14/2021 1039   PLT 111 (L) 05/14/2021 1039   MCV 97.8 05/14/2021 1039   MCH 32.0 05/14/2021 1039   MCHC 32.8 05/14/2021 1039   RDW 15.2 05/14/2021 1039   LYMPHSABS 1.0 05/14/2021 1039   MONOABS 0.4 05/14/2021 1039   EOSABS 0.3 05/14/2021 1039   BASOSABS 0.0 05/14/2021 1039   CMP Latest Ref Rng & Units 05/14/2021 05/25/2020 05/17/2020  Glucose 70 - 99 mg/dL 158(H) 154(H) 108(H)  BUN 8 - 23 mg/dL 10 9 8   Creatinine 0.44 - 1.00 mg/dL 0.79 0.82 0.74  Sodium 135 - 145 mmol/L 137 129(L) 132(L)  Potassium 3.5 - 5.1 mmol/L 3.8 4.6 4.3  Chloride 98 - 111 mmol/L 107 100 104  CO2 22 - 32 mmol/L 19(L) 21(L) 23  Calcium 8.9 - 10.3 mg/dL 8.5(L) 8.2(L) 8.6(L)  Total Protein 6.5 - 8.1 g/dL 6.0(L) - -  Total Bilirubin 0.3 - 1.2 mg/dL 1.1 - -  Alkaline Phos 38 - 126 U/L 89 - -  AST 15 - 41 U/L 41 - -  ALT 0 - 44 U/L 20 - -    DIAGNOSTIC IMAGING:  I have independently reviewed the relevant imaging and discussed with the patient.  ASSESSMENT & PLAN: 1.  Pancytopenia - Seen at the request of  rheumatologist (Dr. Gavin Pound) for evaluation of pancytopenia. - Per medical records sent by Dr. Trudie Reed, patient was noted to have onset of pancytopenia in November 2022. - She has been on methotrexate, Plaquenil, and Enbrel for psoriatic arthritis.  She is also on folic acid supplement. - Due to pancytopenia, methotrexate and Plaquenil were held by rheumatologist as of 02/18/2021.  She continues to take her Enbrel. - Lab trends as follows: Baseline CBC (04/30/2020): WBC 5.3, hemoglobin 13.2/MCV 95, platelets 131 Onset of pancytopenia (02/12/2021): WBC 2.9, hemoglobin 10.9, platelets 95 Repeat CBC after holding MTX (03/12/2021): WBC 3.9, Hgb 10.5/MCV 97, platelets 94 Repeat CBC (04/17/2021): WBC 3.2, Hgb 10.0/MCV 92, platelets 93 - Workup revealed Vitamin B12 deficiency, borderline iron deficiency, and hemoccult positive stool.  Otherwise, unremarkable with normal flow cytometry and SPEP.  Negative hepatitis panel. - Most recent CBC (05/14/2021): WBC 3.9/normal differential, platelets 111, Hgb 11.5/MCV 97.8 - No B symptoms or frequent infections.  - No lymphadenopathy or splenomegaly palpated on exam.  - Mild pancytopenia may do be due to vitamin B12 deficiency versus medication effect. - Per rheumatologist note (05/15/2021), Dr. Trudie Reed was considering trial of holding Enbrel - PLAN: Vitamin B12  and iron repletion as below. -  From hematology standpoint, it is okay to continue Enbrel at this time.  If she continues to have pancytopenia after B12 and iron repletion, would consider trial of holding Enbrel at that time. - RTC in 3 months for repeat labs and office visit. - No indication for bone marrow biopsy at this time, but we would consider in the future if she has further unexplained worsening of her blood counts.   2.  Mild anemia with marginal iron deficiency - History of GI bleed in 2012 due to stomach ulcer, required blood transfusions and iron infusions. - Hemoccult stool POSITIVE x3 in  February 2023 - Labs (05/14/2021): Hgb 11.5/MCV 97.8, ferritin 38 with iron saturation 26% - She does not take any iron supplementation at home - Reports daily nosebleeds described as "large clots coming out of her nose when she sneezes" - No bright red blood per rectum, melena, or hematuria  - PLAN: Recommend starting ferrous sulfate 325 mg daily along with stool softener and orange juice.   - Referral sent to ENT due to daily nosebleeds - Referral to gastroenterology due to Hemoccult positive stool (previously seen by Dr. Laural Golden) - Repeat labs and RTC in 3 months  3.  Vitamin B12 deficiency - Labs from 05/14/2021 show normal vitamin B12 at 295, but elevated methylmalonic acid 938 - PLAN: Vitamin B12 injection given today.  Patient will start vitamin B12 1000 mcg daily at home. - Repeat vitamin B12/methylmalonic acid in 3 months  4.  Fatigue - Chronic multifactorial fatigue related to fibromyalgia, autoimmune disease (psoriatic arthritis), physical deconditioning, and heart disease - She may also have some aspect of fatigue from her vitamin B12 and iron deficiency  5.  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  No tobacco, alcohol, or illicit drug use. - FAMILY: Mother had strokes.  Father had heart problems. Brother had prostate cancer.   PLAN SUMMARY & DISPOSITION: Referral to ENT for nosebleeds Referral to Dr. Laural Golden (GI) for Hemoccult positive stool x3 B12 and iron repletion via OTC supplements Labs in 3 months Office visit after labs  All questions were answered. The patient knows to call the clinic with any problems, questions or concerns.  Medical decision making: Moderate  Time spent on visit: I spent 20 minutes counseling the patient face to face. The total time spent in the  appointment was 30 minutes and more than 50% was on counseling.   Harriett Rush, PA-C  06/04/2021 12:11 PM

## 2021-06-04 ENCOUNTER — Other Ambulatory Visit: Payer: Self-pay

## 2021-06-04 ENCOUNTER — Encounter (INDEPENDENT_AMBULATORY_CARE_PROVIDER_SITE_OTHER): Payer: Self-pay | Admitting: *Deleted

## 2021-06-04 ENCOUNTER — Inpatient Hospital Stay (HOSPITAL_COMMUNITY): Payer: HMO | Attending: Hematology | Admitting: Physician Assistant

## 2021-06-04 ENCOUNTER — Inpatient Hospital Stay (HOSPITAL_COMMUNITY): Payer: HMO

## 2021-06-04 VITALS — BP 133/51 | HR 61 | Temp 98.9°F | Resp 18 | Ht 63.0 in | Wt 161.2 lb

## 2021-06-04 DIAGNOSIS — Z90710 Acquired absence of both cervix and uterus: Secondary | ICD-10-CM | POA: Diagnosis not present

## 2021-06-04 DIAGNOSIS — E611 Iron deficiency: Secondary | ICD-10-CM

## 2021-06-04 DIAGNOSIS — I10 Essential (primary) hypertension: Secondary | ICD-10-CM | POA: Insufficient documentation

## 2021-06-04 DIAGNOSIS — E538 Deficiency of other specified B group vitamins: Secondary | ICD-10-CM | POA: Diagnosis not present

## 2021-06-04 DIAGNOSIS — D509 Iron deficiency anemia, unspecified: Secondary | ICD-10-CM | POA: Diagnosis not present

## 2021-06-04 DIAGNOSIS — D61818 Other pancytopenia: Secondary | ICD-10-CM | POA: Diagnosis not present

## 2021-06-04 DIAGNOSIS — E1136 Type 2 diabetes mellitus with diabetic cataract: Secondary | ICD-10-CM | POA: Diagnosis not present

## 2021-06-04 MED ORDER — CYANOCOBALAMIN 1000 MCG/ML IJ SOLN
1000.0000 ug | Freq: Once | INTRAMUSCULAR | Status: AC
  Administered 2021-06-04: 1000 ug via INTRAMUSCULAR
  Filled 2021-06-04: qty 1

## 2021-06-04 NOTE — Progress Notes (Unsigned)
Rebecca Hartman presents today for injection per the provider's orders.  Vitamin B12 administration without incident; injection site WNL; see MAR for injection details.  Patient tolerated procedure well and without incident. Patient remained stable throughout. No questions or complaints noted at this time. Patient discharged ambulatory and in stable condition with family member. ?

## 2021-06-04 NOTE — Patient Instructions (Addendum)
[place] [facility] ?Discharge Instructions ? ?You were seen today by Tarri Abernethy PA-C for follow-up of your low blood counts.  The labs that we checked at your last visit showed that you have low vitamin B12 and slightly low iron, which may be contributing to your low blood counts. ?START taking vitamin B12 supplement (cyanocobalamin) 1000 mcg tablet daily.  We will give you vitamin B12 injection today. ?START taking iron supplement (ferrous sulfate 325 mg) once daily.  Take this medication with a glass of orange juice to help your body to absorb it better.  If this causes any significant constipation, you can take a stool softener. ?CONTINUE your other vitamins and supplements at this time. ? ?Your stool tests showed presence of blood in your bowel movements.  Therefore we will REFER YOU TO GASTROENTEROLOGY (Dr. Laural Golden) for additional testing and treatment of possible blood loss from your stomach or intestinal tract. ? ?The ENT we referred you to at your last visit is no longer in practice.  We will refer you to a new ENT doctor for your daily nosebleeds.  You can also discuss this with your primary care provider, Dr. Nevada Crane. ? ?For the time being, you can continue to take your Enbrel if your rheumatologist agrees. ? ?We will check your labs again in 3 months.  If your low blood counts have not improved after being given vitamin B12 supplements, we will see if you need to stop your Enbrel at that time. ? ?It is possible that some of your fatigue is from your vitamin B12 and iron levels being low.  Hopefully, you will feel better once your B12 and iron levels are back to normal.  However, your fatigue may also be in part due to your heart disease and autoimmune disease (psoriatic arthritis), as well as other chronic medical conditions. ? ?FOLLOW-UP APPOINTMENT: Labs in 3 months with office visit the week after labs ? ? ?Thank you for choosing [place] at [facility] to provide your oncology and hematology care.  To afford each patient quality time with our provider, please arrive at least 15 minutes before your scheduled appointment time.  ? ?If you have a lab appointment with the [facility] please come in thru the Main Entrance and check in at the main information desk. ? ?You need to re-schedule your appointment should you arrive 10 or more minutes late.  We strive to give you quality time with our providers, and arriving late affects you and other patients whose appointments are after yours.  Also, if you no show three or more times for appointments you may be dismissed from the clinic at the providers discretion.     ?Again, thank you for choosing [facility]  Our hope is that these requests will decrease the amount of time that you wait before being seen by our physicians.       ?_____________________________________________________________ ? ?Should you have questions after your visit to [facility], please contact our office at [phone] and follow the prompts.  Our office hours are 8:00 a.m. and 4:30 p.m. Monday - Friday.  Please note that voicemails left after 4:00 p.m. may not be returned until the following business day.  We are closed weekends and major holidays.  You do have access to a nurse 24-7, just call the main number to the clinic [phone] and do not press any options, hold on the line and  a nurse will answer the phone.   ? ?For prescription refill requests, have your pharmacy contact our office and allow 72 hours.   ? ?Due to Covid, you will need to wear a mask upon entering the hospital. If you do not have a mask, a mask will be given to you at the Main Entrance upon arrival. For doctor visits, patients may have 1 support person age 61 or older with them. For treatment visits, patients can not have anyone with them due to social distancing guidelines and our immunocompromised population.  ? ? ? ?

## 2021-06-06 DIAGNOSIS — E1169 Type 2 diabetes mellitus with other specified complication: Secondary | ICD-10-CM | POA: Diagnosis not present

## 2021-06-06 DIAGNOSIS — M810 Age-related osteoporosis without current pathological fracture: Secondary | ICD-10-CM | POA: Diagnosis not present

## 2021-06-06 DIAGNOSIS — D509 Iron deficiency anemia, unspecified: Secondary | ICD-10-CM | POA: Diagnosis not present

## 2021-06-06 DIAGNOSIS — E782 Mixed hyperlipidemia: Secondary | ICD-10-CM | POA: Diagnosis not present

## 2021-06-06 DIAGNOSIS — E039 Hypothyroidism, unspecified: Secondary | ICD-10-CM | POA: Diagnosis not present

## 2021-06-07 ENCOUNTER — Other Ambulatory Visit: Payer: Self-pay | Admitting: Cardiology

## 2021-06-12 DIAGNOSIS — K219 Gastro-esophageal reflux disease without esophagitis: Secondary | ICD-10-CM | POA: Diagnosis not present

## 2021-06-12 DIAGNOSIS — I358 Other nonrheumatic aortic valve disorders: Secondary | ICD-10-CM | POA: Diagnosis not present

## 2021-06-12 DIAGNOSIS — D509 Iron deficiency anemia, unspecified: Secondary | ICD-10-CM | POA: Diagnosis not present

## 2021-06-12 DIAGNOSIS — G4700 Insomnia, unspecified: Secondary | ICD-10-CM | POA: Diagnosis not present

## 2021-06-12 DIAGNOSIS — E1169 Type 2 diabetes mellitus with other specified complication: Secondary | ICD-10-CM | POA: Diagnosis not present

## 2021-06-12 DIAGNOSIS — E782 Mixed hyperlipidemia: Secondary | ICD-10-CM | POA: Diagnosis not present

## 2021-06-12 DIAGNOSIS — E039 Hypothyroidism, unspecified: Secondary | ICD-10-CM | POA: Diagnosis not present

## 2021-06-12 DIAGNOSIS — B3731 Acute candidiasis of vulva and vagina: Secondary | ICD-10-CM | POA: Diagnosis not present

## 2021-06-12 DIAGNOSIS — M810 Age-related osteoporosis without current pathological fracture: Secondary | ICD-10-CM | POA: Diagnosis not present

## 2021-06-12 DIAGNOSIS — I2510 Atherosclerotic heart disease of native coronary artery without angina pectoris: Secondary | ICD-10-CM | POA: Diagnosis not present

## 2021-06-12 DIAGNOSIS — L4050 Arthropathic psoriasis, unspecified: Secondary | ICD-10-CM | POA: Diagnosis not present

## 2021-06-12 DIAGNOSIS — E559 Vitamin D deficiency, unspecified: Secondary | ICD-10-CM | POA: Diagnosis not present

## 2021-06-15 ENCOUNTER — Other Ambulatory Visit: Payer: Self-pay | Admitting: Cardiology

## 2021-08-05 ENCOUNTER — Other Ambulatory Visit (INDEPENDENT_AMBULATORY_CARE_PROVIDER_SITE_OTHER): Payer: Self-pay

## 2021-08-05 ENCOUNTER — Encounter (INDEPENDENT_AMBULATORY_CARE_PROVIDER_SITE_OTHER): Payer: Self-pay

## 2021-08-05 ENCOUNTER — Telehealth: Payer: Self-pay | Admitting: *Deleted

## 2021-08-05 ENCOUNTER — Encounter (INDEPENDENT_AMBULATORY_CARE_PROVIDER_SITE_OTHER): Payer: Self-pay | Admitting: Gastroenterology

## 2021-08-05 ENCOUNTER — Ambulatory Visit (INDEPENDENT_AMBULATORY_CARE_PROVIDER_SITE_OTHER): Payer: HMO | Admitting: Gastroenterology

## 2021-08-05 VITALS — BP 148/60 | HR 59 | Temp 98.3°F | Ht 64.0 in | Wt 157.2 lb

## 2021-08-05 DIAGNOSIS — D509 Iron deficiency anemia, unspecified: Secondary | ICD-10-CM | POA: Diagnosis not present

## 2021-08-05 DIAGNOSIS — R7989 Other specified abnormal findings of blood chemistry: Secondary | ICD-10-CM

## 2021-08-05 NOTE — Telephone Encounter (Signed)
? ?  Pre-operative Risk Assessment  ?  ?Patient Name: Rebecca Hartman  ?DOB: 1945/04/01 ?MRN: [id number]  ? ?  ? ?Request for Surgical Clearance   ? ?Procedure:   Colonoscopy and Upper Endoscopy ? ?Date of Surgery:  Clearance 09/13/21                              ?   ?Surgeon:  Dr. [name] ?Surgeon's Group or Practice Name:  [facility]. ?Phone number:  [phone] ?Fax number:  [phone] ?  ?Type of Clearance Requested:   ?- Pharmacy:  Hold Clopidogrel (Plavix) X 5 days. ?  ?Type of Anesthesia:  MAC ?  ?Additional requests/questions:   ? ?Signed, ?Sheilia Reznick Avanell Shackleton   ?08/05/2021, 11:24 AM   ?

## 2021-08-05 NOTE — Telephone Encounter (Signed)
? ? ?  Patient Name: Rebecca Hartman  ?DOB: 08/14/1945 ?MRN: [id number] ? ?Primary Cardiologist: Rozann Lesches, MD ? ?Chart reviewed as part of pre-operative protocol coverage.  ? ?Hx of CAD status post DES to the mid LAD in February 2022 with otherwise medically managed circumflex and RCA disease. On DAPT with ASA and Plavix.  ? ?Dr. Domenic Polite, please provide clearance regarding holding Plavix ?  ?Please forward your response to [facility] PREOP.  ? ?Thank you  ?

## 2021-08-05 NOTE — Progress Notes (Signed)
[name], M.D. ?Gastroenterology & Hepatology ?[facility] ?[address] ?[place], [place] [address] ?Primary Care Physician: ?Celene Squibb, MD ?[place] ?[place] [address] ? ?Referring MD: Mammie Lorenzo ? ?Chief Complaint: Iron deficiency anemia. ? ?History of Present Illness: ?Rebecca Hartman is a 76 y.o. female with past medical history of coronary artery disease status post stent placement on Plavix, psoriatic arthritis,, hypertension, fibromyalgia, hyperlipidemia, hypothyroidism, type 2 diabetes, osteoporosis, who presents for evaluation of Iron deficiency anemia. ? ?Patient has been seen at the hematology clinic for episodes of chronic fatigue and pancytopenia.  He has been attributed to medications such as methotrexate or blood.  Most recent labs from 05/14/2021 showed CBC hemoglobin 9.5, MCV 87, iron 99, TIBC 382, ferritin 38, iron saturation 77%, folic acid 37.  Notably, her iron stores in 2016 were low.  Also, upon review of her most recent blood work-up on 05/14/2021, she was found to have an AST of 41, ALT 20, alkaline phosphatase of 89, albumin 3.4, total bili 1.1, platelets of 111. ? ?Patient was referred by Dr. Delton Coombes to our clinic after presenting positive Hemoccult testing. ? ?Patient has been asymptomatic and denies any current complaints.  Patient denies any hematochezia or melena. The patient denies having any nausea, vomiting, fever, chills,  hematemesis, abdominal distention, abdominal pain, diarrhea, jaundice, pruritus or weight loss. ? ?Does not take NSAIDs, only Tylenol. She is taking Ferosul 325 mg, was started in March 2023. ? ?Previously had elevated gastrin in the 400-500 range but most recent in 2014 was 48. ? ?Last EGD: 2014 ?Noncritical ring at GE junction and a small sliding hiatal hernia. ?Food debris noted in the stomach and duodenal bulb. ?Pyloric channel inflammation but pyloric channel was  patent. ?Stricture at duodenal angle with circumferential ulceration. Stricture was dilated to 15 mm with a balloon and appears to be source of delayed gastric emptying. ?Elevated gastrin level may be secondary to delayed gastric emptying but need to rule out gastrinoma. ? ?Last Colonoscopy: possibly 2008 , no report available ? ?FHx: neg for any gastrointestinal/liver disease, no malignancies ?Social: neg smoking, alcohol or illicit drug use ?Surgical: hysterectomy, cholecystectomy, ?gastric surgery for PUD ? ?Past Medical History: ?Past Medical History:  ?Diagnosis Date  ? Anemia   ? Anxiety   ? Arthritis   ? Cataract   ? Chronic back pain   ? Coronary artery disease   ? Multivessel disease, status post DES to mid LAD 05/2020  ? Essential hypertension   ? Fibromyalgia   ? Hiatal hernia   ? History of blood transfusion   ? Hypercholesteremia   ? Hypothyroidism   ? Osteoporosis   ? Type 2 diabetes mellitus ([place])   ? ? ?Past Surgical History: ?Past Surgical History:  ?Procedure Laterality Date  ? ABDOMINAL HYSTERECTOMY    ? BACK SURGERY    ? C-EYE SURGERY PROCEDURE    ? CARPAL TUNNEL RELEASE    ? CATARACT EXTRACTION    ? CHOLECYSTECTOMY    ? COLONOSCOPY  08/06/06 [facility]  ? CORONARY STENT INTERVENTION  05/24/2020  ? CORONARY STENT INTERVENTION N/A 05/24/2020  ? Procedure: CORONARY STENT INTERVENTION;  Surgeon: Burnell Blanks, MD;  Location: [facility];  Service: Cardiovascular;  Laterality: N/A;  ? DENTAL SURGERY  04/2015  ? reconstructive surgery lower  ? ESOPHAGOGASTRODUODENOSCOPY  10/31/2010  ? Procedure: ESOPHAGOGASTRODUODENOSCOPY (EGD);  Surgeon: [name], MD;  [facility];  Service: Endoscopy;  Laterality: N/A;  ? ESOPHAGOGASTRODUODENOSCOPY  03/05/2011  ? Procedure: ESOPHAGOGASTRODUODENOSCOPY (EGD);  Surgeon: [name], MD;  [facility];  Service: Endoscopy;  Laterality: N/A;  12:00  ? ESOPHAGOGASTRODUODENOSCOPY  04/28/2012  ? Procedure: ESOPHAGOGASTRODUODENOSCOPY  (EGD);  Surgeon: [name], MD;  [facility];  Service: Endoscopy;  Laterality: N/A;  140-moved to 10:30 Ann notified pt  ? EYE SURGERY    ? INTRAVASCULAR IMAGING/OCT N/A 05/24/2020  ? Procedure: INTRAVASCULAR IMAGING/OCT;  Surgeon: Burnell Blanks, MD;  Location: [facility];  Service: Cardiovascular;  Laterality: N/A;  ? INTRAVASCULAR PRESSURE WIRE/FFR STUDY N/A 05/24/2020  ? Procedure: INTRAVASCULAR PRESSURE WIRE/FFR STUDY;  Surgeon: Burnell Blanks, MD;  Location: [facility];  Service: Cardiovascular;  Laterality: N/A;  ? LEFT HEART CATH AND CORONARY ANGIOGRAPHY N/A 05/24/2020  ? Procedure: LEFT HEART CATH AND CORONARY ANGIOGRAPHY;  Surgeon: Burnell Blanks, MD;  Location: [facility];  Service: Cardiovascular;  Laterality: N/A;  ? TONSILLECTOMY    ? TOTAL HIP REVISION    ? UPPER GASTROINTESTINAL ENDOSCOPY  10/03/2010  ? EGD DILATN PYLORIC CHANNEL  ? UPPER GASTROINTESTINAL ENDOSCOPY  07/08/2010  ? UPPER GASTROINTESTINAL ENDOSCOPY  08/06/06  ? FI[facility]DS  ? UPPER GASTROINTESTINAL ENDOSCOPY  06/29/2008  ? EGD  ? ? ?Family History: ?Family History  ?Problem Relation Age of Onset  ? Diabetes Mother   ? Stroke Mother   ? Hyperlipidemia Mother   ? Hypertension Mother   ? Stroke Father   ? Heart disease Father   ? Hyperlipidemia Father   ? Hypertension Father   ? Healthy Son   ? ? ?Social History: ?Social History  ? ?Tobacco Use  ?Smoking Status Never  ?Smokeless Tobacco Never  ? ?Social History  ? ?Substance and Sexual Activity  ?Alcohol Use No  ? Alcohol/week: 0.0 standard drinks  ? ?Social History  ? ?Substance and Sexual Activity  ?Drug Use No  ? ? ?Allergies: ?Allergies  ?Allergen Reactions  ? Shellfish Allergy Itching and Nausea And Vomiting  ? Tussigon [Hydrocodone Bit-Homatrop Mbr]   ?  Per patient's husband , this medication caused confusion.  ? Hydrocodone Itching  ? Glyburide   ?  Sudden Drop in Blood Sugars  ? Levofloxacin Itching  ? Lisinopril Other  (See Comments)  ?  SEVERE DROP IN PRESSURE  ? Morphine Itching and Nausea Only  ? ? ?Medications: ?Current Outpatient Medications  ?Medication Sig Dispense Refill  ? ACCU-CHEK AVIVA PLUS test strip     ? acetaminophen (TYLENOL) 500 MG tablet Take 500-1,000 mg by mouth every 6 (six) hours as needed (pain).    ? alum & mag hydroxide-simeth (MAALOX/MYLANTA) 200-200-20 MG/5ML suspension Take 15-30 mLs by mouth every 6 (six) hours as needed for indigestion or heartburn.    ? atorvastatin (LIPITOR) 40 MG tablet Take 1 tablet (40 mg total) by mouth daily. 90 tablet 3  ? Beta Carotene (VITAMIN A) 25000 UNIT capsule Take 25,000 Units by mouth daily.    ? Calcium Carbonate Antacid (MAALOX) 600 MG chewable tablet Chew 600 mg by mouth. Prn.    ? clopidogrel (PLAVIX) 75 MG tablet Take 1 tablet (75 mg total) by mouth daily with breakfast. 90 tablet 3  ? diltiazem (CARDIZEM CD) 120 MG 24 hr capsule Take 1 capsule (120 mg total) by mouth daily. 90 capsule 3  ? diphenhydrAMINE (BENADRYL) 25 MG tablet 1 tablet as needed    ? empagliflozin (JARDIANCE) 10 MG TABS tablet Take  25 mg by mouth daily.    ? escitalopram (LEXAPRO) 10 MG tablet Take 10 mg by mouth daily.    ? Etanercept (ENBREL [place]) Inject into the skin. Weekly per patient.    ? FEROSUL 325 (65 Fe) MG tablet Take 325 mg by mouth at bedtime.    ? fluconazole (DIFLUCAN) 100 MG tablet Take 100 mg by mouth daily as needed (yeast).    ? folic acid (FOLVITE) 1 MG tablet Take 1 mg by mouth every evening.    ? guaiFENesin (MUCINEX) 600 MG 12 hr tablet Take by mouth 2 (two) times daily as needed.    ? isosorbide mononitrate (IMDUR) 30 MG 24 hr tablet TAKE 1 TABLET BY MOUTH ONCE DAILY. 30 tablet 0  ? JARDIANCE 25 MG TABS tablet Take 25 mg by mouth daily.    ? levothyroxine (SYNTHROID, LEVOTHROID) 50 MCG tablet Take 50 mcg by mouth daily.    ? metoprolol tartrate (LOPRESSOR) 50 MG tablet TAKE (1) TABLET BY MOUTH TWICE DAILY. (Patient taking differently: Take 50 mg by mouth 2 (two) times  daily.) 180 tablet 0  ? mupirocin ointment (BACTROBAN) 2 % 1 application. 2 (two) times daily. Prn.    ? nitroGLYCERIN (NITROSTAT) 0.4 MG SL tablet PLACE 1 TAB UNDER TONGUE EVERY 5 MIN IF NEEDED FOR CHEST PAIN.

## 2021-08-05 NOTE — Telephone Encounter (Signed)
" ?  Yes, Plavix may be held 7 days as requested.   ? ?

## 2021-08-05 NOTE — Patient Instructions (Signed)
Schedule liver elastography ?Schedule EGD and colonoscopy ? ? ?

## 2021-08-06 ENCOUNTER — Encounter (INDEPENDENT_AMBULATORY_CARE_PROVIDER_SITE_OTHER): Payer: Self-pay

## 2021-08-09 ENCOUNTER — Ambulatory Visit (HOSPITAL_COMMUNITY)
Admission: RE | Admit: 2021-08-09 | Discharge: 2021-08-09 | Disposition: A | Discharge status: Home / Self Care | Payer: HMO | Source: Ambulatory Visit | Attending: Gastroenterology | Admitting: Gastroenterology

## 2021-08-09 DIAGNOSIS — R7989 Other specified abnormal findings of blood chemistry: Secondary | ICD-10-CM | POA: Insufficient documentation

## 2021-08-19 ENCOUNTER — Other Ambulatory Visit: Payer: Self-pay | Admitting: Cardiology

## 2021-08-19 ENCOUNTER — Other Ambulatory Visit (INDEPENDENT_AMBULATORY_CARE_PROVIDER_SITE_OTHER): Payer: Self-pay | Admitting: Gastroenterology

## 2021-08-19 DIAGNOSIS — K7460 Unspecified cirrhosis of liver: Secondary | ICD-10-CM

## 2021-08-20 ENCOUNTER — Encounter (INDEPENDENT_AMBULATORY_CARE_PROVIDER_SITE_OTHER): Payer: Self-pay | Admitting: *Deleted

## 2021-08-22 DIAGNOSIS — D61818 Other pancytopenia: Secondary | ICD-10-CM | POA: Diagnosis not present

## 2021-08-22 DIAGNOSIS — M3500 Sicca syndrome, unspecified: Secondary | ICD-10-CM | POA: Diagnosis not present

## 2021-08-22 DIAGNOSIS — M797 Fibromyalgia: Secondary | ICD-10-CM | POA: Diagnosis not present

## 2021-08-22 DIAGNOSIS — Z79899 Other long term (current) drug therapy: Secondary | ICD-10-CM | POA: Diagnosis not present

## 2021-08-22 DIAGNOSIS — L409 Psoriasis, unspecified: Secondary | ICD-10-CM | POA: Diagnosis not present

## 2021-08-22 DIAGNOSIS — E663 Overweight: Secondary | ICD-10-CM | POA: Diagnosis not present

## 2021-08-22 DIAGNOSIS — L4050 Arthropathic psoriasis, unspecified: Secondary | ICD-10-CM | POA: Diagnosis not present

## 2021-08-22 DIAGNOSIS — Z6826 Body mass index (BMI) 26.0-26.9, adult: Secondary | ICD-10-CM | POA: Diagnosis not present

## 2021-08-22 DIAGNOSIS — E538 Deficiency of other specified B group vitamins: Secondary | ICD-10-CM | POA: Diagnosis not present

## 2021-09-03 ENCOUNTER — Inpatient Hospital Stay (HOSPITAL_COMMUNITY): Payer: HMO | Attending: Hematology

## 2021-09-03 DIAGNOSIS — I10 Essential (primary) hypertension: Secondary | ICD-10-CM | POA: Insufficient documentation

## 2021-09-03 DIAGNOSIS — R079 Chest pain, unspecified: Secondary | ICD-10-CM | POA: Insufficient documentation

## 2021-09-03 DIAGNOSIS — L4050 Arthropathic psoriasis, unspecified: Secondary | ICD-10-CM | POA: Insufficient documentation

## 2021-09-03 DIAGNOSIS — M1990 Unspecified osteoarthritis, unspecified site: Secondary | ICD-10-CM | POA: Diagnosis not present

## 2021-09-03 DIAGNOSIS — R5383 Other fatigue: Secondary | ICD-10-CM | POA: Diagnosis not present

## 2021-09-03 DIAGNOSIS — Z90710 Acquired absence of both cervix and uterus: Secondary | ICD-10-CM | POA: Insufficient documentation

## 2021-09-03 DIAGNOSIS — I2510 Atherosclerotic heart disease of native coronary artery without angina pectoris: Secondary | ICD-10-CM | POA: Insufficient documentation

## 2021-09-03 DIAGNOSIS — E1136 Type 2 diabetes mellitus with diabetic cataract: Secondary | ICD-10-CM | POA: Diagnosis not present

## 2021-09-03 DIAGNOSIS — D649 Anemia, unspecified: Secondary | ICD-10-CM | POA: Insufficient documentation

## 2021-09-03 DIAGNOSIS — E039 Hypothyroidism, unspecified: Secondary | ICD-10-CM | POA: Insufficient documentation

## 2021-09-03 DIAGNOSIS — K769 Liver disease, unspecified: Secondary | ICD-10-CM | POA: Diagnosis not present

## 2021-09-03 DIAGNOSIS — D696 Thrombocytopenia, unspecified: Secondary | ICD-10-CM | POA: Insufficient documentation

## 2021-09-03 DIAGNOSIS — E538 Deficiency of other specified B group vitamins: Secondary | ICD-10-CM | POA: Insufficient documentation

## 2021-09-03 DIAGNOSIS — D61818 Other pancytopenia: Secondary | ICD-10-CM | POA: Insufficient documentation

## 2021-09-03 DIAGNOSIS — E611 Iron deficiency: Secondary | ICD-10-CM

## 2021-09-03 LAB — FERRITIN: Ferritin: 67 ng/mL (ref 11–307)

## 2021-09-03 LAB — CBC WITH DIFFERENTIAL/PLATELET
Abs Immature Granulocytes: 0.01 10*3/uL (ref 0.00–0.07)
Basophils Absolute: 0 10*3/uL (ref 0.0–0.1)
Basophils Relative: 1 %
Eosinophils Absolute: 0.2 10*3/uL (ref 0.0–0.5)
Eosinophils Relative: 4 %
HCT: 35.7 % — ABNORMAL LOW (ref 36.0–46.0)
Hemoglobin: 12.2 g/dL (ref 12.0–15.0)
Immature Granulocytes: 0 %
Lymphocytes Relative: 25 %
Lymphs Abs: 1 10*3/uL (ref 0.7–4.0)
MCH: 33.2 pg (ref 26.0–34.0)
MCHC: 34.2 g/dL (ref 30.0–36.0)
MCV: 97 fL (ref 80.0–100.0)
Monocytes Absolute: 0.4 10*3/uL (ref 0.1–1.0)
Monocytes Relative: 9 %
Neutro Abs: 2.5 10*3/uL (ref 1.7–7.7)
Neutrophils Relative %: 61 %
Platelets: 73 10*3/uL — ABNORMAL LOW (ref 150–400)
RBC: 3.68 MIL/uL — ABNORMAL LOW (ref 3.87–5.11)
RDW: 13.9 % (ref 11.5–15.5)
WBC: 4.1 10*3/uL (ref 4.0–10.5)
nRBC: 0 % (ref 0.0–0.2)

## 2021-09-03 LAB — IRON AND TIBC
Iron: 104 ug/dL (ref 28–170)
Saturation Ratios: 32 % — ABNORMAL HIGH (ref 10.4–31.8)
TIBC: 321 ug/dL (ref 250–450)
UIBC: 217 ug/dL

## 2021-09-03 LAB — VITAMIN B12: Vitamin B-12: 1078 pg/mL — ABNORMAL HIGH (ref 180–914)

## 2021-09-05 LAB — METHYLMALONIC ACID, SERUM: Methylmalonic Acid, Quantitative: 163 nmol/L (ref 0–378)

## 2021-09-07 NOTE — Progress Notes (Signed)
[facility] [address], [place] [address]   CLINIC:  Medical Oncology/Hematology  PCP:  [name], MD [address] [place]urey Mor[place] [address] [phone]   REASON FOR VISIT:  Follow-up for pancytopenia  PRIOR THERAPY: None  CURRENT THERAPY: Under work-up  INTERVAL HISTORY:  Ms. Rebecca Hartman 76 y.o. female returns for routine follow-up of her pancytopenia.  She was last seen by Rojelio Brenner PA-C on 06/04/2021.  At today's visit, she continues to report significant chronic fatigue.  She denies any changes or new complaints since her last visit.  She continues to have epistaxis associated with excessive sneezing, but reports that this has not been as frequent as it was during springtime.  Nosebleeds are more frequently from her right nostril than her left.  She reports that she has "large clots coming out of her nose when she sneezes."  She is scheduled for ENT follow-up tomorrow (09/10/2021).  She reports some slight improvement in energy after starting iron and B12 supplements. She reports that her stool has been dark ever since she started iron pills, but she denies any hematemesis, hematochezia, melena, or hematuria.    She admits to easy bruising but denies petechial rash.     She has occasional chest pain (followed by cardiology).  She denies any recent headaches, palpitations, or dyspnea on exertion.  No pica, lightheadedness, or syncope. She has not noticed any new lumps or bumps.  She denies any B symptoms such as fever, chills, night sweats, unintentional weight loss.  She has not had any recent or recurrent infections.   She continues to follow-up with rheumatology (Dr. Nickola Major) regarding her psoriatic arthritis.  She remains on Enbrel at this time.  She reports that during previous instances when Enbrel was held, she had significant recurrence of her psoriatic rash and arthritis.  She has 40% energy and 80% appetite. She endorses that she is  maintaining a stable weight.   REVIEW OF SYSTEMS:    Review of Systems  Constitutional:  Positive for fatigue. Negative for appetite change, chills, diaphoresis, fever and unexpected weight change.  HENT:   Negative for lump/mass, nosebleeds and trouble swallowing.   Eyes:  Negative for eye problems.  Respiratory:  Negative for cough, hemoptysis and shortness of breath.   Cardiovascular:  Positive for chest pain (Occasional, none today). Negative for leg swelling and palpitations.  Gastrointestinal:  Positive for constipation. Negative for abdominal pain, blood in stool, diarrhea, nausea and vomiting.  Genitourinary:  Positive for bladder incontinence. Negative for hematuria.   Skin:  Rash: Psoriatic rash of right knee.  Neurological:  Positive for dizziness and numbness. Negative for headaches and light-headedness.  Hematological:  Does not bruise/bleed easily.  Psychiatric/Behavioral:  Positive for depression. The patient is nervous/anxious.       PAST MEDICAL/SURGICAL HISTORY:  Past Medical History:  Diagnosis Date   Anemia    Anxiety    Arthritis    Cataract    Chronic back pain    Coronary artery disease    Multivessel disease, status post DES to mid LAD 05/2020   Essential hypertension    Fibromyalgia    Hiatal hernia    History of blood transfusion    Hypercholesteremia    Hypothyroidism    Osteoporosis    Type 2 diabetes mellitus (HCC)    Past Surgical History:  Procedure Laterality Date   ABDOMINAL HYSTERECTOMY     BACK SURGERY     C-EYE SURGERY PROCEDURE  CARPAL TUNNEL RELEASE     CATARACT EXTRACTION     CHOLECYSTECTOMY     COLONOSCOPY  08/06/06 FIELDS   CORONARY STENT INTERVENTION  05/24/2020   CORONARY STENT INTERVENTION N/A 05/24/2020   Procedure: CORONARY STENT INTERVENTION;  Surgeon: Kathleene Hazel, MD;  Location: [facility];  Service: Cardiovascular;  Laterality: N/A;   DENTAL SURGERY  04/2015   reconstructive surgery lower    ESOPHAGOGASTRODUODENOSCOPY  10/31/2010   Procedure: ESOPHAGOGASTRODUODENOSCOPY (EGD);  Surgeon: Malissa Hippo, MD;  Location: [facility];  Service: Endoscopy;  Laterality: N/A;   ESOPHAGOGASTRODUODENOSCOPY  03/05/2011   Procedure: ESOPHAGOGASTRODUODENOSCOPY (EGD);  Surgeon: Malissa Hippo, MD;  Location: [facility];  Service: Endoscopy;  Laterality: N/A;  12:00   ESOPHAGOGASTRODUODENOSCOPY  04/28/2012   Procedure: ESOPHAGOGASTRODUODENOSCOPY (EGD);  Surgeon: Malissa Hippo, MD;  Location: [facility];  Service: Endoscopy;  Laterality: N/A;  140-moved to 10:30 Ann notified pt   EYE SURGERY     INTRAVASCULAR IMAGING/OCT N/A 05/24/2020   Procedure: INTRAVASCULAR IMAGING/OCT;  Surgeon: Kathleene Hazel, MD;  Location: [facility];  Service: Cardiovascular;  Laterality: N/A;   INTRAVASCULAR PRESSURE WIRE/FFR STUDY N/A 05/24/2020   Procedure: INTRAVASCULAR PRESSURE WIRE/FFR STUDY;  Surgeon: Kathleene Hazel, MD;  Location: [facility];  Service: Cardiovascular;  Laterality: N/A;   LEFT HEART CATH AND CORONARY ANGIOGRAPHY N/A 05/24/2020   Procedure: LEFT HEART CATH AND CORONARY ANGIOGRAPHY;  Surgeon: Kathleene Hazel, MD;  Location: [facility];  Service: Cardiovascular;  Laterality: N/A;   TONSILLECTOMY     TOTAL HIP REVISION     UPPER GASTROINTESTINAL ENDOSCOPY  10/03/2010   EGD DILATN PYLORIC CHANNEL   UPPER GASTROINTESTINAL ENDOSCOPY  07/08/2010   UPPER GASTROINTESTINAL ENDOSCOPY  08/06/06   FIELDS   UPPER GASTROINTESTINAL ENDOSCOPY  06/29/2008   EGD     SOCIAL HISTORY:  Social History   Socioeconomic History   Marital status: Married    Spouse name: Not on file   Number of children: Not on file   Years of education: Not on file   Highest education level: Not on file  Occupational History   Occupation: retired  Tobacco Use   Smoking status: Never   Smokeless tobacco: Never  Vaping Use   Vaping Use: Never used  Substance and Sexual  Activity   Alcohol use: No    Alcohol/week: 0.0 standard drinks of alcohol   Drug use: No   Sexual activity: Yes    Birth control/protection: Post-menopausal  Other Topics Concern   Not on file  Social History Narrative   Not on file   Social Determinants of Health   Financial Resource Strain: Not on file  Food Insecurity: Not on file  Transportation Needs: Not on file  Physical Activity: Not on file  Stress: Not on file  Social Connections: Not on file  Intimate Partner Violence: Not on file    FAMILY HISTORY:  Family History  Problem Relation Age of Onset   Diabetes Mother    Stroke Mother    Hyperlipidemia Mother    Hypertension Mother    Stroke Father    Heart disease Father    Hyperlipidemia Father    Hypertension Father    Healthy Son     CURRENT MEDICATIONS:  Outpatient Encounter Medications as of 09/09/2021  Medication Sig   ACCU-CHEK AVIVA PLUS test strip    acetaminophen (TYLENOL) 500 MG tablet Take 500-1,000 mg by mouth every 6 (six)  hours as needed (pain).   alum & mag hydroxide-simeth (MAALOX/MYLANTA) 200-200-20 MG/5ML suspension Take 15-30 mLs by mouth every 6 (six) hours as needed for indigestion or heartburn.   atorvastatin (LIPITOR) 40 MG tablet Take 1 tablet (40 mg total) by mouth daily.   Beta Carotene (VITAMIN A) 25000 UNIT capsule Take 25,000 Units by mouth daily.   Calcium Carbonate Antacid (MAALOX) 600 MG chewable tablet Chew 600 mg by mouth. Prn.   clopidogrel (PLAVIX) 75 MG tablet Take 1 tablet (75 mg total) by mouth daily with breakfast.   diltiazem (CARDIZEM CD) 120 MG 24 hr capsule Take 1 capsule (120 mg total) by mouth daily.   diphenhydrAMINE (BENADRYL) 25 MG tablet 1 tablet as needed   empagliflozin (JARDIANCE) 10 MG TABS tablet Take 25 mg by mouth daily.   escitalopram (LEXAPRO) 10 MG tablet Take 10 mg by mouth daily.   Etanercept (ENBREL [place]) Inject into the skin. Weekly per patient.   FEROSUL 325 (65 Fe) MG tablet Take 325 mg by mouth  at bedtime.   fluconazole (DIFLUCAN) 100 MG tablet Take 100 mg by mouth daily as needed (yeast).   folic acid (FOLVITE) 1 MG tablet Take 1 mg by mouth every evening.   guaiFENesin (MUCINEX) 600 MG 12 hr tablet Take by mouth 2 (two) times daily as needed.   isosorbide mononitrate (IMDUR) 30 MG 24 hr tablet TAKE 1 TABLET BY MOUTH ONCE DAILY.   JARDIANCE 25 MG TABS tablet Take 25 mg by mouth daily.   levothyroxine (SYNTHROID, LEVOTHROID) 50 MCG tablet Take 50 mcg by mouth daily.   metoprolol tartrate (LOPRESSOR) 50 MG tablet TAKE (1) TABLET BY MOUTH TWICE DAILY. (Patient taking differently: Take 50 mg by mouth 2 (two) times daily.)   mupirocin ointment (BACTROBAN) 2 % 1 application. 2 (two) times daily. Prn.   nitroGLYCERIN (NITROSTAT) 0.4 MG SL tablet PLACE 1 TAB UNDER TONGUE EVERY 5 MIN IF NEEDED FOR CHEST PAIN. MAY USE 3 TIMES.NO RELIEF CALL 911.   OVER THE COUNTER MEDICATION Vit D3 1,000 IU 25 mg daily   OVER THE COUNTER MEDICATION B12 1000 mcg QHS   pantoprazole (PROTONIX) 40 MG tablet Take 40 mg by mouth daily.   temazepam (RESTORIL) 30 MG capsule Take 30 mg by mouth at bedtime.   Facility-Administered Encounter Medications as of 09/09/2021  Medication   sodium chloride flush (NS) 0.9 % injection 3 mL    ALLERGIES:  Allergies  Allergen Reactions   Shellfish Allergy Itching and Nausea And Vomiting   Tussigon [Hydrocodone Bit-Homatrop Mbr]     Per patient's husband , this medication caused confusion.   Hydrocodone Itching   Glyburide     Sudden Drop in Blood Sugars   Levofloxacin Itching   Lisinopril Other (See Comments)    SEVERE DROP IN PRESSURE   Morphine Itching and Nausea Only     PHYSICAL EXAM:  ECOG PERFORMANCE STATUS: 2 - Symptomatic, <50% confined to bed    There were no vitals filed for this visit. There were no vitals filed for this visit. Physical Exam Constitutional:      Appearance: Normal appearance. She is obese.  HENT:     Head: Normocephalic and  atraumatic.     Mouth/Throat:     Mouth: Mucous membranes are moist.  Eyes:     Extraocular Movements: Extraocular movements intact.     Pupils: Pupils are equal, round, and reactive to light.  Cardiovascular:     Rate and Rhythm: Regular rhythm. Bradycardia present.  Pulses: Normal pulses.     Heart sounds: Murmur heard.  Pulmonary:     Effort: Pulmonary effort is normal.     Breath sounds: Normal breath sounds.  Abdominal:     General: Bowel sounds are normal.     Palpations: Abdomen is soft.     Tenderness: There is no abdominal tenderness.  Musculoskeletal:        General: No swelling.     Right lower leg: No edema.     Left lower leg: No edema.  Lymphadenopathy:     Cervical: No cervical adenopathy.  Skin:    General: Skin is warm and dry.  Neurological:     General: No focal deficit present.     Mental Status: She is alert and oriented to person, place, and time.  Psychiatric:        Mood and Affect: Mood normal.        Behavior: Behavior normal.      LABORATORY DATA:  I have reviewed the labs as listed.  CBC    Component Value Date/Time   WBC 4.1 09/03/2021 1049   RBC 3.68 (L) 09/03/2021 1049   HGB 12.2 09/03/2021 1049   HCT 35.7 (L) 09/03/2021 1049   PLT 73 (L) 09/03/2021 1049   MCV 97.0 09/03/2021 1049   MCH 33.2 09/03/2021 1049   MCHC 34.2 09/03/2021 1049   RDW 13.9 09/03/2021 1049   LYMPHSABS 1.0 09/03/2021 1049   MONOABS 0.4 09/03/2021 1049   EOSABS 0.2 09/03/2021 1049   BASOSABS 0.0 09/03/2021 1049      Latest Ref Rng & Units 05/14/2021   10:39 AM 05/25/2020    1:28 AM 05/17/2020    3:29 PM  CMP  Glucose 70 - 99 mg/dL 161  096  045   BUN 8 - 23 mg/dL 10  9  8    Creatinine 0.44 - 1.00 mg/dL 4.09  8.11  9.14   Sodium 135 - 145 mmol/L 137  129  132   Potassium 3.5 - 5.1 mmol/L 3.8  4.6  4.3   Chloride 98 - 111 mmol/L 107  100  104   CO2 22 - 32 mmol/L 19  21  23    Calcium 8.9 - 10.3 mg/dL 8.5  8.2  8.6   Total Protein 6.5 - 8.1 g/dL 6.0      Total Bilirubin 0.3 - 1.2 mg/dL 1.1     Alkaline Phos 38 - 126 U/L 89     AST 15 - 41 U/L 41     ALT 0 - 44 U/L 20       DIAGNOSTIC IMAGING:  I have independently reviewed the relevant imaging and discussed with the patient.  ASSESSMENT & PLAN: 1.  Pancytopenia (resolved) with persistent moderate thrombocytopenia - Seen at the request of rheumatologist (Dr. Zenovia Jordan) for evaluation of pancytopenia. - Per medical records sent by Dr. Nickola Major, patient was noted to have onset of pancytopenia in November 2022. - She has been on methotrexate, Plaquenil, and Enbrel for psoriatic arthritis.  She is also on folic acid supplement. - Due to pancytopenia, methotrexate and Plaquenil were held by rheumatologist as of 02/18/2021.  She continues to take her Enbrel.  - Lab trends as follows: Baseline CBC (04/30/2020): WBC 5.3, hemoglobin 13.2/MCV 95, platelets 131 Onset of pancytopenia (02/12/2021): WBC 2.9, hemoglobin 10.9, platelets 95 Repeat CBC after holding MTX (03/12/2021): WBC 3.9, Hgb 10.5/MCV 97, platelets 94 Repeat CBC (04/17/2021): WBC 3.2, Hgb 10.0/MCV 92, platelets 94 CBC (05/14/2021):  WBC 3.9/normal differential, platelets 111, Hgb 11.5/MCV 97.8 Most recent CBC (09/03/2021): WBC 4.1, normal differential, Hgb 12.2/MCV 97.0, platelets 73 - Workup revealed Vitamin B12 deficiency, borderline iron deficiency, and hemoccult positive stool.  Otherwise, unremarkable with normal flow cytometry and SPEP.  Negative hepatitis panel. - RUQ abdomen US (08/09/2021): Findings suspicious for hepatic cirrhosis but with normal hepatic elastography  - No B symptoms or frequent infections.    - No lymphadenopathy or splenomegaly palpated on exam.    - Mild pancytopenia has resolved, but she has persistent moderate thrombocytopenia. - PLAN: She has normal WBC and hemoglobin now, but persistent thrombocytopenia despite B12 and iron repletion.  Differential diagnosis and work-up for persistent thrombocytopenia  includes: Thrombocytopenia related to liver disease and possible splenomegaly: Check splenic ultrasound Autoimmune thrombocytopenia: We will consider trial of steroids if platelets <50 Medication effect: Unlikely to be secondary to Enbrel, although this may be a contributing factor.  Would not consider holding Enbrel unless platelets <50.  (Patient has previously held Enbrel in the past with significant recurrent psoriasis as a result) MDS or other bone marrow infiltrative process: No indication for bone marrow biopsy since other cell lines have normalized.  Would consider bone marrow biopsy if she had other cytopenias or development of B symptoms.  Would consider CT CAP in the presence of B symptoms due to increased risk of lymphoproliferative disorders in patients with autoimmune disease. -Repeat labs and RTC in 4 months.   2.  Mild anemia with marginal iron deficiency - History of GI bleed in 2012 due to stomach ulcer, required blood transfusions and iron infusions. - Hemoccult stool POSITIVE x3 in February 2023 - Labs (05/14/2021): Hgb 11.5/MCV 97.8, ferritin 38 with iron saturation 26% - Taking ferrous sulfate 325 mg once daily since March 2023  - Most recent labs (09/03/2021): Improved Hgb 12.2/MCV 97.0, improved ferritin 67, iron saturation 32% - Reports daily nosebleeds described as "large clots coming out of her nose when she sneezes".  She has been referred to ENT, with first appointment scheduled for 09/10/2021.    - No bright red blood per rectum, melena, or hematuria   - She has been referred to GI (Dr. Levon Hedger) and is scheduled for EGD and colonoscopy on 09/18/2021 - PLAN: Hemoglobin and iron stores improved.  Continue ferrous sulfate 325 mg daily along with stool softener and orange juice.   - Continue follow-up with GI and ENT - Repeat labs and RTC in 4 months    3.  Vitamin B12 deficiency - Labs from 05/14/2021 show normal vitamin B12 at 295, but elevated methylmalonic acid 938 -  Vitamin B12 injection given on 06/04/2021 - Has been taking vitamin B12 1000 mcg daily since March 2023 - Most recent labs (09/03/2021): Vitamin B12 1078, methylmalonic acid 163 - PLAN: Continue vitamin B12 1000 mcg daily.   - Recheck vitamin B12/methylmalonic acid in 3 months  4.  Fatigue   - Chronic multifactorial fatigue related to fibromyalgia, autoimmune disease (psoriatic arthritis), physical deconditioning, and heart disease - She may also have some aspect of fatigue from her vitamin B12 and iron deficiency - she has had slightly improved energy after B12 and iron repletion  5.  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  No tobacco, alcohol, or illicit drug use. - FAMILY: Mother had strokes.  Father had heart problems. Brother had prostate  cancer.   PLAN SUMMARY & DISPOSITION: Continue ENT and GI follow-up  Ultrasound spleen  B12 and iron repletion via OTC supplements Labs in 4 months Office visit after labs  All questions were answered. The patient knows to call the clinic with any problems, questions or concerns.  Medical decision making: Moderate    Time spent on visit: I spent 20 minutes counseling the patient face to face. The total time spent in the appointment was 30 minutes and more than 50% was on counseling.   Carnella Guadalajara, PA-C  09/09/2021 7:31 PM

## 2021-09-09 ENCOUNTER — Other Ambulatory Visit (HOSPITAL_COMMUNITY): Payer: Self-pay | Admitting: *Deleted

## 2021-09-09 ENCOUNTER — Telehealth (HOSPITAL_COMMUNITY): Payer: Self-pay | Admitting: *Deleted

## 2021-09-09 ENCOUNTER — Inpatient Hospital Stay (HOSPITAL_COMMUNITY): Payer: HMO | Admitting: Physician Assistant

## 2021-09-09 ENCOUNTER — Inpatient Hospital Stay (HOSPITAL_COMMUNITY): Payer: HMO

## 2021-09-09 VITALS — BP 147/59 | HR 52 | Temp 97.9°F | Resp 17 | Ht 64.0 in | Wt 153.0 lb

## 2021-09-09 DIAGNOSIS — D61818 Other pancytopenia: Secondary | ICD-10-CM

## 2021-09-09 DIAGNOSIS — E611 Iron deficiency: Secondary | ICD-10-CM | POA: Diagnosis not present

## 2021-09-09 DIAGNOSIS — E538 Deficiency of other specified B group vitamins: Secondary | ICD-10-CM | POA: Diagnosis not present

## 2021-09-09 LAB — CBC
HCT: 38.2 % (ref 36.0–46.0)
Hemoglobin: 13 g/dL (ref 12.0–15.0)
MCH: 32.6 pg (ref 26.0–34.0)
MCHC: 34 g/dL (ref 30.0–36.0)
MCV: 95.7 fL (ref 80.0–100.0)
Platelets: 85 10*3/uL — ABNORMAL LOW (ref 150–400)
RBC: 3.99 MIL/uL (ref 3.87–5.11)
RDW: 13.7 % (ref 11.5–15.5)
WBC: 4.9 10*3/uL (ref 4.0–10.5)
nRBC: 0 % (ref 0.0–0.2)

## 2021-09-09 NOTE — Telephone Encounter (Signed)
Per Tarri Abernethy - PAC, called patient to make her aware that steroids are not necessary at this time due to improvement in platelets.  She will proceed with ultrasound of spleen as planned.  Rather than a 6 week follow up, appointment made for 4 months with repeat CBC.  BMBX not needed at this time and continue Enbrel as prescribed.  Patient verbalized understanding and will call with any questions or problems in the meantime.

## 2021-09-09 NOTE — Patient Instructions (Signed)
Rebecca Hartman  09/09/2021     '@PREFPERIOPPHARMACY'$ @   Your procedure is scheduled on 09/18/2021.   Report to [facility] at  0900 AM.   Call this number if you have problems the morning of surgery:  [phone]   Remember:  Follow the diet and prep instructions given to you by the office.    Your last dose of plavix should be 08/12/2021 and your last dose of iron should have been on 09/07/2021.      DO NOT take any mediations for diabetes the morning of your procedure.     Take these medicines the morning of surgery with A SIP OF WATER       diltiazem, lexapro, imdur, levothyroxine, metoprolol, protonix.    Do not wear jewelry, make-up or nail polish.  Do not wear lotions, powders, or perfumes, or deodorant.  Do not shave 48 hours prior to surgery.  Men may shave face and neck.  Do not bring valuables to the hospital.  [facility] is not responsible for any belongings or valuables.  Contacts, dentures or bridgework may not be worn into surgery.  Leave your suitcase in the car.  After surgery it may be brought to your room.  For patients admitted to the hospital, discharge time will be determined by your treatment team.  Patients discharged the day of surgery will not be allowed to drive home and must have someone with them for 24 hours.    Special instructions:  DO NOT smoke tobacco or vape for 24 hours before your procedure.  Please read over the following fact sheets that you were given. Anesthesia Post-op Instructions and Care and Recovery After Surgery      Upper Endoscopy, Adult, Care After This sheet gives you information about how to care for yourself after your procedure. Your health care provider may also give you more specific instructions. If you have problems or questions, contact your health care provider. What can I expect after the procedure? After the procedure, it is common to have: A sore throat. Mild stomach pain or  discomfort. Bloating. Nausea. Follow these instructions at home:  Follow instructions from your health care provider about what to eat or drink after your procedure. Return to your normal activities as told by your health care provider. Ask your health care provider what activities are safe for you. Take over-the-counter and prescription medicines only as told by your health care provider. If you were given a sedative during the procedure, it can affect you for several hours. Do not drive or operate machinery until your health care provider says that it is safe. Keep all follow-up visits as told by your health care provider. This is important. Contact a health care provider if you have: A sore throat that lasts longer than one day. Trouble swallowing. Get help right away if: You vomit blood or your vomit looks like coffee grounds. You have: A fever. Bloody, black, or tarry stools. A severe sore throat or you cannot swallow. Difficulty breathing. Severe pain in your chest or abdomen. Summary After the procedure, it is common to have a sore throat, mild stomach discomfort, bloating, and nausea. If you were given a sedative during the procedure, it can affect you for several hours. Do not drive or operate machinery until your health care provider says that it is safe. Follow instructions from your health care provider about what to eat or drink after your procedure. Return to  your normal activities as told by your health care provider. This information is not intended to replace advice given to you by your health care provider. Make sure you discuss any questions you have with your health care provider. Document Revised: 01/21/2019 Document Reviewed: 08/17/2017 Elsevier Patient Education  [place]. Colonoscopy, Adult, Care After The following information offers guidance on how to care for yourself after your procedure. Your health care provider may also give you more specific  instructions. If you have problems or questions, contact your health care provider. What can I expect after the procedure? After the procedure, it is common to have: A small amount of blood in your stool for 24 hours after the procedure. Some gas. Mild cramping or bloating of your abdomen. Follow these instructions at home: Eating and drinking  Drink enough fluid to keep your urine pale yellow. Follow instructions from your health care provider about eating or drinking restrictions. Resume your normal diet as told by your health care provider. Avoid heavy or fried foods that are hard to digest. Activity Rest as told by your health care provider. Avoid sitting for a long time without moving. Get up to take short walks every 1-2 hours. This is important to improve blood flow and breathing. Ask for help if you feel weak or unsteady. Return to your normal activities as told by your health care provider. Ask your health care provider what activities are safe for you. Managing cramping and bloating  Try walking around when you have cramps or feel bloated. If directed, apply heat to your abdomen as told by your health care provider. Use the heat source that your health care provider recommends, such as a moist heat pack or a heating pad. Place a towel between your skin and the heat source. Leave the heat on for 20-30 minutes. Remove the heat if your skin turns bright red. This is especially important if you are unable to feel pain, heat, or cold. You have a greater risk of getting burned. General instructions If you were given a sedative during the procedure, it can affect you for several hours. Do not drive or operate machinery until your health care provider says that it is safe. For the first 24 hours after the procedure: Do not sign important documents. Do not drink alcohol. Do your regular daily activities at a slower pace than normal. Eat soft foods that are easy to digest. Take  over-the-counter and prescription medicines only as told by your health care provider. Keep all follow-up visits. This is important. Contact a health care provider if: You have blood in your stool 2-3 days after the procedure. Get help right away if: You have more than a small spotting of blood in your stool. You have large blood clots in your stool. You have swelling of your abdomen. You have nausea or vomiting. You have a fever. You have increasing pain in your abdomen that is not relieved with medicine. These symptoms may be an emergency. Get help right away. Call 911. Do not wait to see if the symptoms will go away. Do not drive yourself to the hospital. Summary After the procedure, it is common to have a small amount of blood in your stool. You may also have mild cramping and bloating of your abdomen. If you were given a sedative during the procedure, it can affect you for several hours. Do not drive or operate machinery until your health care provider says that it is safe. Get help right  away if you have a lot of blood in your stool, nausea or vomiting, a fever, or increased pain in your abdomen. This information is not intended to replace advice given to you by your health care provider. Make sure you discuss any questions you have with your health care provider. Document Revised: 11/07/2020 Document Reviewed: 11/07/2020 Elsevier Patient Education  [place] After This sheet gives you information about how to care for yourself after your procedure. Your health care provider may also give you more specific instructions. If you have problems or questions, contact your health care provider. What can I expect after the procedure? After the procedure, it is common to have: Tiredness. Forgetfulness about what happened after the procedure. Impaired judgment for important decisions. Nausea or vomiting. Some difficulty with balance. Follow these  instructions at home: For the time period you were told by your health care provider:     Rest as needed. Do not participate in activities where you could fall or become injured. Do not drive or use machinery. Do not drink alcohol. Do not take sleeping pills or medicines that cause drowsiness. Do not make important decisions or sign legal documents. Do not take care of children on your own. Eating and drinking Follow the diet that is recommended by your health care provider. Drink enough fluid to keep your urine pale yellow. If you vomit: Drink water, juice, or soup when you can drink without vomiting. Make sure you have little or no nausea before eating solid foods. General instructions Have a responsible adult stay with you for the time you are told. It is important to have someone help care for you until you are awake and alert. Take over-the-counter and prescription medicines only as told by your health care provider. If you have sleep apnea, surgery and certain medicines can increase your risk for breathing problems. Follow instructions from your health care provider about wearing your sleep device: Anytime you are sleeping, including during daytime naps. While taking prescription pain medicines, sleeping medicines, or medicines that make you drowsy. Avoid smoking. Keep all follow-up visits as told by your health care provider. This is important. Contact a health care provider if: You keep feeling nauseous or you keep vomiting. You feel light-headed. You are still sleepy or having trouble with balance after 24 hours. You develop a rash. You have a fever. You have redness or swelling around the IV site. Get help right away if: You have trouble breathing. You have new-onset confusion at home. Summary For several hours after your procedure, you may feel tired. You may also be forgetful and have poor judgment. Have a responsible adult stay with you for the time you are told. It  is important to have someone help care for you until you are awake and alert. Rest as told. Do not drive or operate machinery. Do not drink alcohol or take sleeping pills. Get help right away if you have trouble breathing, or if you suddenly become confused. This information is not intended to replace advice given to you by your health care provider. Make sure you discuss any questions you have with your health care provider. Document Revised: 02/19/2021 Document Reviewed: 02/17/2019 Elsevier Patient Education  [place].

## 2021-09-09 NOTE — Patient Instructions (Addendum)
[place] at [facility] Discharge Instructions  You were seen today by Tarri Abernethy PA-C for your follow-up visit.    LOW PLATELETS: Platelets remain low even after taking the B12 and iron.  We do not yet know the exact cause of your low platelets, but will investigate the following possible causes. Liver disease: Liver disease can cause an enlarged spleen, which can cause platelets to be low.  Therefore, we will check an ultrasound of your spleen. Autoimmune disease: Your immune system may be attacking a platelets.  To see if this is the case, we will check platelet count today.  If your platelets are low, we will prescribe you a 4-day course of steroids.  The steroids will suppress your immune system, and will let us see if your platelets improve. Medication effect: You can continue your Enbrel for now, until we have ruled out other possible causes of low platelets. Bone marrow disorder: If the above tests are normal, we may need to order a bone marrow biopsy to see if you have any bone marrow disease causing your low platelets.  Your iron and B12 levels are improved.  Continue to take B12 and iron tablets daily.  Your hemoglobin (red blood cell) and white blood cells have improved after taking B12 and iron.  Continue follow-up with ENT and gastroenterology as previously scheduled.  LABS: Check labs today before leaving the hospital building.  OTHER TESTS: Ultrasound of your spleen (upper left abdomen)  MEDICATIONS: Possible steroids - we we will send prescription once we know what your platelets are today.  (Steroids may cause short-term increase in your blood sugars.)  FOLLOW-UP APPOINTMENT: Office visit in 4 to 6 weeks to discuss the above results.   - - - - - - - - - - - - - - -    Thank you for choosing [place] at [facility] to provide your oncology and hematology care.  To afford each patient quality time with our provider,  please arrive at least 15 minutes before your scheduled appointment time.   If you have a lab appointment with the [facility] please come in thru the Main Entrance and check in at the main information desk.  You need to re-schedule your appointment should you arrive 10 or more minutes late.  We strive to give you quality time with our providers, and arriving late affects you and other patients whose appointments are after yours.  Also, if you no show three or more times for appointments you may be dismissed from the clinic at the providers discretion.     Again, thank you for choosing [facility].  Our hope is that these requests will decrease the amount of time that you wait before being seen by our physicians.       _____________________________________________________________  Should you have questions after your visit to [facility], please contact our office at [phone] and follow the prompts.  Our office hours are 8:00 a.m. and 4:30 p.m. Monday - Friday.  Please note that voicemails left after 4:00 p.m. may not be returned until the following business day.  We are closed weekends and major holidays.  You do have access to a nurse 24-7, just call the main number to the clinic [phone] and do not press any options, hold on the line and a nurse will answer the phone.    For prescription refill requests, have your pharmacy contact our  office and allow 72 hours.    Due to Covid, you will need to wear a mask upon entering the hospital. If you do not have a mask, a mask will be given to you at the Main Entrance upon arrival. For doctor visits, patients may have 1 support person age 76 or older with them. For treatment visits, patients can not have anyone with them due to social distancing guidelines and our immunocompromised population.

## 2021-09-10 DIAGNOSIS — R040 Epistaxis: Secondary | ICD-10-CM | POA: Diagnosis not present

## 2021-09-11 ENCOUNTER — Telehealth (INDEPENDENT_AMBULATORY_CARE_PROVIDER_SITE_OTHER): Payer: Self-pay

## 2021-09-11 ENCOUNTER — Encounter (HOSPITAL_COMMUNITY)
Admission: RE | Admit: 2021-09-11 | Discharge: 2021-09-11 | Disposition: A | Discharge status: Home / Self Care | Payer: HMO | Source: Ambulatory Visit | Attending: Gastroenterology | Admitting: Gastroenterology

## 2021-09-11 VITALS — BP 158/57 | HR 58 | Temp 97.9°F | Resp 18 | Ht 64.0 in | Wt 153.0 lb

## 2021-09-11 DIAGNOSIS — I10 Essential (primary) hypertension: Secondary | ICD-10-CM | POA: Diagnosis not present

## 2021-09-11 DIAGNOSIS — Z01818 Encounter for other preprocedural examination: Secondary | ICD-10-CM | POA: Insufficient documentation

## 2021-09-11 DIAGNOSIS — E118 Type 2 diabetes mellitus with unspecified complications: Secondary | ICD-10-CM | POA: Diagnosis not present

## 2021-09-11 MED ORDER — PEG 3350-KCL-NA BICARB-NACL 420 G PO SOLR: 4000.0000 mL | ORAL | 0 refills | Status: DC

## 2021-09-11 NOTE — Telephone Encounter (Signed)
Rebecca Hartman, CMA  ?

## 2021-09-11 NOTE — Telephone Encounter (Signed)
Rebecca Hartman Rebecca Hartman, CMA  ?

## 2021-09-16 ENCOUNTER — Ambulatory Visit (HOSPITAL_COMMUNITY)
Admission: RE | Admit: 2021-09-16 | Discharge: 2021-09-16 | Disposition: A | Discharge status: Home / Self Care | Payer: HMO | Source: Ambulatory Visit | Attending: Physician Assistant | Admitting: Physician Assistant

## 2021-09-16 DIAGNOSIS — D61818 Other pancytopenia: Secondary | ICD-10-CM | POA: Insufficient documentation

## 2021-09-16 DIAGNOSIS — R161 Splenomegaly, not elsewhere classified: Secondary | ICD-10-CM | POA: Diagnosis not present

## 2021-09-17 NOTE — Progress Notes (Signed)
Patient called and made aware of results and verbalized understanding.

## 2021-09-18 ENCOUNTER — Ambulatory Visit (HOSPITAL_COMMUNITY): Payer: HMO | Admitting: Anesthesiology

## 2021-09-18 ENCOUNTER — Ambulatory Visit (HOSPITAL_BASED_OUTPATIENT_CLINIC_OR_DEPARTMENT_OTHER): Payer: HMO | Admitting: Anesthesiology

## 2021-09-18 ENCOUNTER — Encounter (HOSPITAL_COMMUNITY): Payer: Self-pay | Admitting: Gastroenterology

## 2021-09-18 ENCOUNTER — Encounter (HOSPITAL_COMMUNITY)
Admission: RE | Disposition: A | Discharge status: Home / Self Care | Payer: Self-pay | Source: Home / Self Care | Attending: Gastroenterology

## 2021-09-18 ENCOUNTER — Ambulatory Visit (HOSPITAL_COMMUNITY)
Admission: RE | Admit: 2021-09-18 | Discharge: 2021-09-18 | Disposition: A | Discharge status: Home / Self Care | Payer: HMO | Attending: Gastroenterology | Admitting: Gastroenterology

## 2021-09-18 DIAGNOSIS — Z7984 Long term (current) use of oral hypoglycemic drugs: Secondary | ICD-10-CM | POA: Diagnosis not present

## 2021-09-18 DIAGNOSIS — E119 Type 2 diabetes mellitus without complications: Secondary | ICD-10-CM | POA: Insufficient documentation

## 2021-09-18 DIAGNOSIS — K766 Portal hypertension: Secondary | ICD-10-CM

## 2021-09-18 DIAGNOSIS — K3189 Other diseases of stomach and duodenum: Secondary | ICD-10-CM | POA: Insufficient documentation

## 2021-09-18 DIAGNOSIS — L603 Nail dystrophy: Secondary | ICD-10-CM

## 2021-09-18 DIAGNOSIS — K449 Diaphragmatic hernia without obstruction or gangrene: Secondary | ICD-10-CM | POA: Insufficient documentation

## 2021-09-18 DIAGNOSIS — I25119 Atherosclerotic heart disease of native coronary artery with unspecified angina pectoris: Secondary | ICD-10-CM | POA: Diagnosis not present

## 2021-09-18 DIAGNOSIS — K6389 Other specified diseases of intestine: Secondary | ICD-10-CM | POA: Insufficient documentation

## 2021-09-18 DIAGNOSIS — D509 Iron deficiency anemia, unspecified: Secondary | ICD-10-CM | POA: Insufficient documentation

## 2021-09-18 DIAGNOSIS — G4700 Insomnia, unspecified: Secondary | ICD-10-CM

## 2021-09-18 DIAGNOSIS — I10 Essential (primary) hypertension: Secondary | ICD-10-CM | POA: Insufficient documentation

## 2021-09-18 DIAGNOSIS — L4050 Arthropathic psoriasis, unspecified: Secondary | ICD-10-CM

## 2021-09-18 DIAGNOSIS — I8511 Secondary esophageal varices with bleeding: Secondary | ICD-10-CM | POA: Diagnosis not present

## 2021-09-18 DIAGNOSIS — J0101 Acute recurrent maxillary sinusitis: Secondary | ICD-10-CM

## 2021-09-18 DIAGNOSIS — R9439 Abnormal result of other cardiovascular function study: Secondary | ICD-10-CM

## 2021-09-18 DIAGNOSIS — K648 Other hemorrhoids: Secondary | ICD-10-CM | POA: Insufficient documentation

## 2021-09-18 DIAGNOSIS — K219 Gastro-esophageal reflux disease without esophagitis: Secondary | ICD-10-CM | POA: Insufficient documentation

## 2021-09-18 DIAGNOSIS — E039 Hypothyroidism, unspecified: Secondary | ICD-10-CM | POA: Insufficient documentation

## 2021-09-18 DIAGNOSIS — I8500 Esophageal varices without bleeding: Secondary | ICD-10-CM | POA: Insufficient documentation

## 2021-09-18 DIAGNOSIS — M797 Fibromyalgia: Secondary | ICD-10-CM

## 2021-09-18 DIAGNOSIS — E118 Type 2 diabetes mellitus with unspecified complications: Secondary | ICD-10-CM

## 2021-09-18 DIAGNOSIS — E785 Hyperlipidemia, unspecified: Secondary | ICD-10-CM

## 2021-09-18 DIAGNOSIS — R072 Precordial pain: Secondary | ICD-10-CM

## 2021-09-18 DIAGNOSIS — F419 Anxiety disorder, unspecified: Secondary | ICD-10-CM | POA: Insufficient documentation

## 2021-09-18 DIAGNOSIS — R7989 Other specified abnormal findings of blood chemistry: Secondary | ICD-10-CM

## 2021-09-18 DIAGNOSIS — I200 Unstable angina: Secondary | ICD-10-CM

## 2021-09-18 HISTORY — PX: ESOPHAGOGASTRODUODENOSCOPY (EGD) WITH PROPOFOL: SHX5813

## 2021-09-18 HISTORY — PX: COLONOSCOPY WITH PROPOFOL: SHX5780

## 2021-09-18 LAB — HM COLONOSCOPY

## 2021-09-18 LAB — GLUCOSE, CAPILLARY: Glucose-Capillary: 128 mg/dL — ABNORMAL HIGH (ref 70–99)

## 2021-09-18 SURGERY — COLONOSCOPY WITH PROPOFOL
Anesthesia: General

## 2021-09-18 MED ORDER — EPHEDRINE 5 MG/ML INJ
INTRAVENOUS | Status: AC
  Filled 2021-09-18: qty 5

## 2021-09-18 MED ORDER — PROPOFOL 10 MG/ML IV BOLUS
INTRAVENOUS | Status: DC | PRN
  Administered 2021-09-18: 100 mg via INTRAVENOUS
  Administered 2021-09-18: 50 mg via INTRAVENOUS

## 2021-09-18 MED ORDER — PROPOFOL 500 MG/50ML IV EMUL
INTRAVENOUS | Status: DC | PRN
  Administered 2021-09-18: 150 ug/kg/min via INTRAVENOUS

## 2021-09-18 MED ORDER — DICYCLOMINE HCL 10 MG PO CAPS: 10.0000 mg | ORAL_CAPSULE | Freq: Two times a day (BID) | ORAL | 1 refills | Status: DC | PRN

## 2021-09-18 MED ORDER — LACTATED RINGERS IV SOLN: INTRAVENOUS | Status: DC

## 2021-09-18 MED ORDER — EPHEDRINE SULFATE-NACL 50-0.9 MG/10ML-% IV SOSY
PREFILLED_SYRINGE | INTRAVENOUS | Status: DC | PRN
  Administered 2021-09-18 (×2): 5 mg via INTRAVENOUS

## 2021-09-18 MED ORDER — LIDOCAINE HCL (CARDIAC) PF 100 MG/5ML IV SOSY
PREFILLED_SYRINGE | INTRAVENOUS | Status: DC | PRN
  Administered 2021-09-18: 50 mg via INTRAVENOUS

## 2021-09-18 NOTE — Transfer of Care (Signed)
Immediate Anesthesia Transfer of Care Note  Patient: Rebecca Hartman  Procedure(s) Performed: COLONOSCOPY WITH PROPOFOL ESOPHAGOGASTRODUODENOSCOPY (EGD) WITH PROPOFOL  Patient Location: Short Stay  Anesthesia Type:General  Level of Consciousness: awake  Airway & Oxygen Therapy: Patient Spontanous Breathing  Post-op Assessment: Report given to RN and Post -op Vital signs reviewed and stable  Post vital signs: Reviewed and stable  Last Vitals:  Vitals Value Taken Time  BP    Temp    Pulse    Resp    SpO2      Last Pain:  Vitals:   09/18/21 0929  TempSrc: Oral  PainSc: 0-No pain         Complications: No notable events documented.

## 2021-09-18 NOTE — H&P (Signed)
Rebecca Hartman is an 76 y.o. female.   Chief Complaint: Iron deficiency anemia HPI: Rebecca Hartman is a 76 y.o. female with past medical history of coronary artery disease status post stent placement on Plavix, psoriatic arthritis,, hypertension, fibromyalgia, hyperlipidemia, hypothyroidism, type 2 diabetes, osteoporosis and possible advanced liver fibrosis, who presents for evaluation of Iron deficiency anemia.  The patient reports feeling well and denies having any complaints.  Last iron stores on 09/03/2021 showed normal saturation of 32%, iron of 104 and ferritin of 67.  Her most recent hemoglobin was 13.0 on 09/09/2021.  Has presented some intermittent episodes of right upper quadrant pain which have been self-limited.  The patient denies having any nausea, vomiting, fever, chills, hematochezia, melena, hematemesis, abdominal distention, diarrhea, jaundice, pruritus or weight loss.  Past Medical History:  Diagnosis Date   Anemia    Anxiety    Arthritis    Cataract    Chronic back pain    Coronary artery disease    Multivessel disease, status post DES to mid LAD 05/2020   Essential hypertension    Fibromyalgia    Hiatal hernia    History of blood transfusion    Hypercholesteremia    Hypothyroidism    Osteoporosis    Type 2 diabetes mellitus ([place])     Past Surgical History:  Procedure Laterality Date   ABDOMINAL HYSTERECTOMY     BACK SURGERY     C-EYE SURGERY PROCEDURE     CARPAL TUNNEL RELEASE     CATARACT EXTRACTION     CHOLECYSTECTOMY     COLONOSCOPY  08/06/06 [facility]   CORONARY STENT INTERVENTION  05/24/2020   CORONARY STENT INTERVENTION N/A 05/24/2020   Procedure: CORONARY STENT INTERVENTION;  Surgeon: Burnell Blanks, MD;  Location: [place] [facility];  Service: Cardiovascular;  Laterality: N/A;   DENTAL SURGERY  04/2015   reconstructive surgery lower   ESOPHAGOGASTRODUODENOSCOPY  10/31/2010   Procedure: ESOPHAGOGASTRODUODENOSCOPY (EGD);  Surgeon: [name],  MD;  [facility];  Service: Endoscopy;  Laterality: N/A;   ESOPHAGOGASTRODUODENOSCOPY  03/05/2011   Procedure: ESOPHAGOGASTRODUODENOSCOPY (EGD);  Surgeon: [name], MD;  [facility];  Service: Endoscopy;  Laterality: N/A;  12:00   ESOPHAGOGASTRODUODENOSCOPY  04/28/2012   Procedure: ESOPHAGOGASTRODUODENOSCOPY (EGD);  Surgeon: [name], MD;  [facility];  Service: Endoscopy;  Laterality: N/A;  140-moved to 10:30 Ann notified pt   EYE SURGERY     INTRAVASCULAR IMAGING/OCT N/A 05/24/2020   Procedure: INTRAVASCULAR IMAGING/OCT;  Surgeon: Burnell Blanks, MD;  Location: [place] [facility];  Service: Cardiovascular;  Laterality: N/A;   INTRAVASCULAR PRESSURE WIRE/FFR STUDY N/A 05/24/2020   Procedure: INTRAVASCULAR PRESSURE WIRE/FFR STUDY;  Surgeon: Burnell Blanks, MD;  Location: [place] [facility];  Service: Cardiovascular;  Laterality: N/A;   LEFT HEART CATH AND CORONARY ANGIOGRAPHY N/A 05/24/2020   Procedure: LEFT HEART CATH AND CORONARY ANGIOGRAPHY;  Surgeon: Burnell Blanks, MD;  Location: [place] [facility];  Service: Cardiovascular;  Laterality: N/A;   TONSILLECTOMY     TOTAL HIP REVISION     UPPER GASTROINTESTINAL ENDOSCOPY  10/03/2010   EGD DILATN PYLORIC CHANNEL   UPPER GASTROINTESTINAL ENDOSCOPY  07/08/2010   UPPER GASTROINTESTINAL ENDOSCOPY  08/06/06   [facility]   UPPER GASTROINTESTINAL ENDOSCOPY  06/29/2008   EGD    Family History  Problem Relation Age of Onset   Diabetes Mother    Stroke Mother    Hyperlipidemia Mother  Hypertension Mother    Stroke Father    Heart disease Father    Hyperlipidemia Father    Hypertension Father    Healthy Son    Social History:  reports that she has never smoked. She has never used smokeless tobacco. She reports that she does not drink alcohol and does not use drugs.  Allergies:  Allergies  Allergen Reactions   Shellfish Allergy Itching and Nausea And Vomiting    Tussigon [Hydrocodone Bit-Homatrop Mbr]     Per patient's husband , this medication caused confusion.   Hydrocodone Itching   Glyburide     Sudden Drop in Blood Sugars   Levofloxacin Itching   Zestril [Lisinopril] Other (See Comments)    SEVERE DROP IN PRESSURE   Morphine Itching and Nausea Only    Medications Prior to Admission  Medication Sig Dispense Refill   acetaminophen (TYLENOL) 500 MG tablet Take 500-1,000 mg by mouth every 6 (six) hours as needed (pain).     atorvastatin (LIPITOR) 40 MG tablet Take 1 tablet (40 mg total) by mouth daily. (Patient taking differently: Take 40 mg by mouth every evening.) 90 tablet 3   calcium carbonate (TUMS - DOSED IN MG ELEMENTAL CALCIUM) 500 MG chewable tablet Chew 1-2 tablets by mouth 3 (three) times daily as needed for indigestion or heartburn.     cholecalciferol (VITAMIN D3) 25 MCG (1000 UNIT) tablet Take 1,000 Units by mouth in the morning.     diltiazem (CARDIZEM CD) 120 MG 24 hr capsule Take 1 capsule (120 mg total) by mouth daily. (Patient taking differently: Take 120 mg by mouth every evening.) 90 capsule 3   empagliflozin (JARDIANCE) 25 MG TABS tablet Take 25 mg by mouth in the morning.     escitalopram (LEXAPRO) 10 MG tablet Take 10 mg by mouth daily as needed (anxious).     isosorbide mononitrate (IMDUR) 30 MG 24 hr tablet TAKE 1 TABLET BY MOUTH ONCE DAILY. (Patient taking differently: Take 30 mg by mouth every evening.) 30 tablet 0   JARDIANCE 25 MG TABS tablet Take 25 mg by mouth daily.     levothyroxine (SYNTHROID, LEVOTHROID) 50 MCG tablet Take 50 mcg by mouth daily before breakfast.     metoprolol tartrate (LOPRESSOR) 50 MG tablet TAKE (1) TABLET BY MOUTH TWICE DAILY. 180 tablet 0   nitroGLYCERIN (NITROSTAT) 0.4 MG SL tablet PLACE 1 TAB UNDER TONGUE EVERY 5 MIN IF NEEDED FOR CHEST PAIN. MAY USE 3 TIMES.NO RELIEF CALL 911. 25 tablet 0   pantoprazole (PROTONIX) 40 MG tablet Take 40 mg by mouth in the morning.     Polyethyl  Glycol-Propyl Glycol (SYSTANE) 0.4-0.3 % SOLN Apply 1-2 drops to eye 4 (four) times daily as needed (dry/irritated eyes.).     temazepam (RESTORIL) 30 MG capsule Take 30 mg by mouth at bedtime.     Vitamin A 2400 MCG (8000 UT) CAPS Take 2,400 mcg by mouth in the morning.     vitamin B-12 (CYANOCOBALAMIN) 1000 MCG tablet Take 1,000 mcg by mouth at bedtime.     ACCU-CHEK AVIVA PLUS test strip      clopidogrel (PLAVIX) 75 MG tablet Take 1 tablet (75 mg total) by mouth daily with breakfast. 90 tablet 3   etanercept (ENBREL) 50 MG/ML injection Inject 50 mg into the skin every Monday.     FEROSUL 325 (65 Fe) MG tablet Take 325 mg by mouth at bedtime.     polyethylene glycol-electrolytes (TRILYTE) 420 g solution Take 4,000 mLs by  mouth as directed. 4000 mL 0    Results for orders placed or performed during the hospital encounter of 09/18/21 (from the past 48 hour(s))  Glucose, capillary     Status: Abnormal   Collection Time: 09/18/21  9:20 AM  Result Value Ref Range   Glucose-Capillary 128 (H) 70 - 99 mg/dL    Comment: Glucose reference range applies only to samples taken after fasting for at least 8 hours.   US SPLEEN (ABDOMEN LIMITED)  Result Date: 09/16/2021 CLINICAL DATA:  Thrombocytopenia EXAM: ULTRASOUND ABDOMEN LIMITED COMPARISON:  None Available. FINDINGS: Spleen is enlarged measuring 12.8 x 4 x 15.3 cm. No focal mass identified. IMPRESSION: Splenomegaly. Electronically Signed   By: Ofilia Neas M.D.   On: 09/16/2021 13:15    Review of Systems  Constitutional: Negative.   HENT: Negative.    Eyes: Negative.   Respiratory: Negative.    Cardiovascular: Negative.   Gastrointestinal:  Positive for abdominal pain.  Endocrine: Negative.   Genitourinary: Negative.   Musculoskeletal: Negative.   Skin: Negative.   Allergic/Immunologic: Negative.   Neurological: Negative.   Hematological: Negative.   Psychiatric/Behavioral: Negative.      Blood pressure (!) 159/55, pulse (!) 58,  temperature 98 F (36.7 C), temperature source Oral, resp. rate 15, height '5\' 4"'$  (1.626 m), weight 69.4 kg, SpO2 99 %. Physical Exam  GENERAL: The patient is AO x3, in no acute distress. HEENT: Head is normocephalic and atraumatic. EOMI are intact. Mouth is well hydrated and without lesions. NECK: Supple. No masses LUNGS: Clear to auscultation. No presence of rhonchi/wheezing/rales. Adequate chest expansion HEART: RRR, normal s1 and s2. ABDOMEN: Soft, nontender, no guarding, no peritoneal signs, and nondistended. BS +. No masses. EXTREMITIES: Without any cyanosis, clubbing, rash, lesions or edema. NEUROLOGIC: AOx3, no focal motor deficit. SKIN: no jaundice, no rashes  Assessment/Plan Rebecca Hartman is a 76 y.o. female with past medical history of coronary artery disease status post stent placement on Plavix, psoriatic arthritis,, hypertension, fibromyalgia, hyperlipidemia, hypothyroidism, type 2 diabetes, osteoporosis and possible advanced liver fibrosis, who presents for evaluation of Iron deficiency anemia and abdominal pain.  We will proceed with EGD and colonoscopy.  Harvel Quale, MD 09/18/2021, 10:36 AM

## 2021-09-18 NOTE — Anesthesia Preprocedure Evaluation (Signed)
Anesthesia Evaluation  Patient identified by MRN, date of birth, ID band Patient awake    Reviewed: Allergy & Precautions, NPO status , Patient's Chart, lab work & pertinent test results, reviewed documented beta blocker date and time   Airway Mallampati: I  TM Distance: >3 FB Neck ROM: Full    Dental  (+) Edentulous Upper, Edentulous Lower   Pulmonary neg pulmonary ROS,    Pulmonary exam normal breath sounds clear to auscultation       Cardiovascular hypertension, Pt. on medications and Pt. on home beta blockers + angina with exertion + CAD and + Cardiac Stents   Rhythm:Regular Rate:Normal + Systolic murmurs 07/10/85  Conclusion   Mid RCA lesion is 60% stenosed. Ost Cx to Prox Cx lesion is 65% stenosed. Dist Cx lesion is 60% stenosed. Mid LAD lesion is 90% stenosed. A drug-eluting stent was successfully placed using a STENT RESOLUTE ONYX 3.0X18. ? Post intervention, there is a 0% residual stenosis.   1. Severe stenosis mid LAD 2. Successful OCT guided PCI/DES x 1 mid LAD 3. Moderate, heavily calcified ostial Circumflex stenosis.  4. Moderate mid RCA stenosis  Recommendations: Will continue DAPT with ASA and Plavix for at least six months. Will change Pravachol to Lipitor. Continue beta blocker. Medical management of moderate ostial Circumflex and moderate mid RCA disease    Neuro/Psych PSYCHIATRIC DISORDERS Anxiety  Neuromuscular disease    GI/Hepatic Neg liver ROS, hiatal hernia, GERD  Medicated and Controlled,  Endo/Other  diabetes, Well Controlled, Type 2, Oral Hypoglycemic AgentsHypothyroidism   Renal/GU negative Renal ROS  negative genitourinary   Musculoskeletal  (+) Arthritis , Osteoarthritis,  Fibromyalgia -  Abdominal   Peds negative pediatric ROS (+)  Hematology  (+) Blood dyscrasia, anemia ,   Anesthesia Other Findings 1. Left ventricular ejection fraction, by estimation, is 60 to 65%. The   left ventricle has normal function. The left ventricle has no regional  wall motion abnormalities. There is mild left ventricular hypertrophy.  Left ventricular diastolic parameters  were normal.  2. Right ventricular systolic function is normal. The right ventricular  size is normal. There is normal pulmonary artery systolic pressure.  3. The mitral valve is normal in structure. Trivial mitral valve  regurgitation. No evidence of mitral stenosis.  4. The aortic valve is tricuspid. Aortic valve regurgitation is trivial.  Mild to moderate aortic valve sclerosis/calcification is present, without  any evidence of aortic stenosis.  5. The inferior vena cava is normal in size with greater than 50%  respiratory variability, suggesting right atrial pressure of 3 mmHg.   Reproductive/Obstetrics negative OB ROS                             Anesthesia Physical Anesthesia Plan  ASA: 3  Anesthesia Plan: General   Post-op Pain Management: Minimal or no pain anticipated   Induction: Intravenous  PONV Risk Score and Plan:   Airway Management Planned: Nasal Cannula and Natural Airway  Additional Equipment:   Intra-op Plan:   Post-operative Plan:   Informed Consent: I have reviewed the patients History and Physical, chart, labs and discussed the procedure including the risks, benefits and alternatives for the proposed anesthesia with the patient or authorized representative who has indicated his/her understanding and acceptance.     Dental advisory given  Plan Discussed with: CRNA and Surgeon  Anesthesia Plan Comments:         Anesthesia Quick Evaluation

## 2021-09-18 NOTE — Anesthesia Postprocedure Evaluation (Signed)
Anesthesia Post Note  Patient: RUTHELL FEIGENBAUM  Procedure(s) Performed: COLONOSCOPY WITH PROPOFOL ESOPHAGOGASTRODUODENOSCOPY (EGD) WITH PROPOFOL  Patient location during evaluation: Phase II Anesthesia Type: General Level of consciousness: awake and alert and oriented Pain management: pain level controlled Vital Signs Assessment: post-procedure vital signs reviewed and stable Respiratory status: spontaneous breathing, nonlabored ventilation and respiratory function stable Cardiovascular status: blood pressure returned to baseline and stable Postop Assessment: no apparent nausea or vomiting Anesthetic complications: no   No notable events documented.   Last Vitals:  Vitals:   09/18/21 0929 09/18/21 1125  BP: (!) 159/55 (!) 131/55  Pulse: (!) 58 61  Resp: 15 17  Temp: 36.7 C 37 C  SpO2: 99% 97%    Last Pain:  Vitals:   09/18/21 1125  TempSrc:   PainSc: 0-No pain                 Braedan Meuth C Cyana Shook

## 2021-09-18 NOTE — Op Note (Signed)
[facility] Patient Name: Rebecca Hartman Procedure Date: 09/18/2021 10:35 AM MRN: [id number] Date of Birth: [birth year] Attending MD: [name] ,  CSN: [id number] Age: 76 Admit Type: Outpatient Procedure:                Upper GI endoscopy Indications:              Iron deficiency anemia Providers:                [name], Janeece Riggers, RN, Caprice Kluver Referring MD:              Medicines:                Monitored Anesthesia Care Complications:            No immediate complications. Estimated Blood Loss:     Estimated blood loss: none. Procedure:                Pre-Anesthesia Assessment:                           - Prior to the procedure, a History and Physical                            was performed, and patient medications, allergies                            and sensitivities were reviewed. The patient's                            tolerance of previous anesthesia was reviewed.                           - The risks and benefits of the procedure and the                            sedation options and risks were discussed with the                            patient. All questions were answered and informed                            consent was obtained.                           - ASA Grade Assessment: III - A patient with severe                            systemic disease.                           After obtaining informed consent, the endoscope was                            passed under direct vision. Throughout the                            procedure, the patient's blood pressure,  pulse, and                            oxygen saturations were monitored continuously. The                            [id number]-[id number] ([id number]) scope was introduced through the                            mouth, and advanced to the second part of duodenum.                            The upper GI endoscopy was accomplished without                            difficulty. The patient tolerated the  procedure                            well. Scope In: 10:47:24 AM Scope Out: 10:52:38 AM Total Procedure Duration: 0 hours 5 minutes 14 seconds  Findings:      Grade I varices were found in the lower third of the esophagus.      A 3 cm hiatal hernia was present.      The gastroesophageal flap valve was visualized endoscopically and       classified as Hill Grade IV (no fold, wide open lumen, hiatal hernia       present).      Mild portal hypertensive gastropathy was found in the entire examined       stomach.      The examined duodenum was normal. Impression:               - Grade I esophageal varices.                           - 3 cm hiatal hernia.                           - Gastroesophageal flap valve classified as Hill                            Grade IV (no fold, wide open lumen, hiatal hernia                            present).                           - Portal hypertensive gastropathy.                           - Normal examined duodenum.                           - No specimens collected. Moderate Sedation:      Per Anesthesia Care Recommendation:           - Discharge patient to home (ambulatory).                           -  Resume previous diet.                           - Continue present medications.                           - Repeat upper endoscopy in 2 years for                            surveillance.                           - Return to clinic in November 2023 Procedure Code(s):        --- Professional ---                           [phone], Esophagogastroduodenoscopy, flexible,                            transoral; diagnostic, including collection of                            specimen(s) by brushing or washing, when performed                            (separate procedure) Diagnosis Code(s):        --- Professional ---                           I85.00, Esophageal varices without bleeding                           K44.9, Diaphragmatic hernia without obstruction  or                            gangrene                           K76.6, Portal hypertension                           K31.89, Other diseases of stomach and duodenum                           D50.9, Iron deficiency anemia, unspecified CPT copyright 2019 [employer]. All rights reserved. The codes documented in this report are preliminary and upon coder review may  be revised to meet current compliance requirements. [name], MD [name],  09/18/2021 10:57:28 AM This report has been signed electronically. Number of Addenda: 0

## 2021-09-18 NOTE — Discharge Instructions (Addendum)
You are being discharged to home.  Resume your previous diet.  Continue your present medications.  Your physician has recommended a repeat upper endoscopy in two years for surveillance.  Return to clinic in November 2023 Your physician has indicated that a repeat colonoscopy is not recommended due to your current age (39 years or older) for screening purposes.  Restart Plavix today

## 2021-09-18 NOTE — Op Note (Signed)
[facility] Patient Name: Rebecca Hartman Procedure Date: 09/18/2021 10:35 AM MRN: [id number] Date of Birth: 02-Oct-1945 Attending MD: [name] ,  CSN: [id number] Age: 76 Admit Type: Outpatient Procedure:                Colonoscopy Indications:              Iron deficiency anemia Providers:                [name], Janeece Riggers, RN, Caprice Kluver Referring MD:              Medicines:                Monitored Anesthesia Care Complications:            No immediate complications. Estimated Blood Loss:     Estimated blood loss: none. Procedure:                Pre-Anesthesia Assessment:                           - Prior to the procedure, a History and Physical                            was performed, and patient medications, allergies                            and sensitivities were reviewed. The patient's                            tolerance of previous anesthesia was reviewed.                           - The risks and benefits of the procedure and the                            sedation options and risks were discussed with the                            patient. All questions were answered and informed                            consent was obtained.                           - ASA Grade Assessment: III - A patient with severe                            systemic disease.                           After obtaining informed consent, the colonoscope                            was passed under direct vision. Throughout the                            procedure, the patient's blood pressure, pulse, and  oxygen saturations were monitored continuously. The                            [id number]-HQ190L ([id number]) scope was introduced through                            the anus and advanced to the the cecum, identified                            by appendiceal orifice and ileocecal valve. The                            colonoscopy was performed without difficulty.  The                            patient tolerated the procedure well. The quality                            of the bowel preparation was excellent. Scope In: 10:58:54 AM Scope Out: 11:22:16 AM Scope Withdrawal Time: 0 hours 12 minutes 58 seconds  Total Procedure Duration: 0 hours 23 minutes 22 seconds  Findings:      The perianal and digital rectal examinations were normal.      An area of mildly congested mucosa was found in the ascending colon and       in the cecum. This likely corresponds to portal colopathy.      The exam was otherwise without abnormality.      Non-bleeding internal hemorrhoids were found during retroflexion. The       hemorrhoids were small. Impression:               - Congested mucosa in the ascending colon and in                            the cecum. Portal colopathy.                           - The examination was otherwise normal.                           - Non-bleeding internal hemorrhoids.                           - No specimens collected. Moderate Sedation:      Per Anesthesia Care Recommendation:           - Discharge patient to home (ambulatory).                           - Resume previous diet.                           - Repeat colonoscopy is not recommended due to                            current age (71 years or older) for screening  purposes. Procedure Code(s):        --- Professional ---                           [phone], Colonoscopy, flexible; diagnostic, including                            collection of specimen(s) by brushing or washing,                            when performed (separate procedure) Diagnosis Code(s):        --- Professional ---                           K63.89, Other specified diseases of intestine                           K64.8, Other hemorrhoids                           D50.9, Iron deficiency anemia, unspecified CPT copyright 2019 American [employer] Association. All rights reserved. The codes  documented in this report are preliminary and upon coder review may  be revised to meet current compliance requirements. [name], MD [name],  09/18/2021 11:27:58 AM This report has been signed electronically. Number of Addenda: 0

## 2021-09-18 NOTE — Anesthesia Procedure Notes (Signed)
Date/Time: 09/18/2021 10:51 AM  Performed by: Orlie Dakin, CRNAPre-anesthesia Checklist: Patient identified, Emergency Drugs available, Suction available and Patient being monitored Patient Re-evaluated:Patient Re-evaluated prior to induction Oxygen Delivery Method: Nasal cannula Induction Type: IV induction Placement Confirmation: positive ETCO2

## 2021-09-19 ENCOUNTER — Encounter (INDEPENDENT_AMBULATORY_CARE_PROVIDER_SITE_OTHER): Payer: Self-pay | Admitting: *Deleted

## 2021-09-25 ENCOUNTER — Encounter (HOSPITAL_COMMUNITY): Payer: Self-pay | Admitting: Gastroenterology

## 2021-10-07 ENCOUNTER — Ambulatory Visit (HOSPITAL_COMMUNITY): Payer: HMO | Admitting: Physician Assistant

## 2021-10-14 ENCOUNTER — Other Ambulatory Visit: Payer: Self-pay | Admitting: Cardiology

## 2021-10-31 ENCOUNTER — Other Ambulatory Visit: Payer: Self-pay | Admitting: Cardiology

## 2021-11-06 ENCOUNTER — Other Ambulatory Visit: Payer: Self-pay | Admitting: Cardiology

## 2021-11-07 ENCOUNTER — Telehealth: Payer: Self-pay

## 2021-11-07 DIAGNOSIS — E782 Mixed hyperlipidemia: Secondary | ICD-10-CM | POA: Diagnosis not present

## 2021-11-07 DIAGNOSIS — E1169 Type 2 diabetes mellitus with other specified complication: Secondary | ICD-10-CM | POA: Diagnosis not present

## 2021-11-07 DIAGNOSIS — E039 Hypothyroidism, unspecified: Secondary | ICD-10-CM | POA: Diagnosis not present

## 2021-11-07 MED ORDER — ISOSORBIDE MONONITRATE ER 30 MG PO TB24: 30.0000 mg | ORAL_TABLET | Freq: Every day | ORAL | 3 refills | Status: DC

## 2021-11-07 NOTE — Telephone Encounter (Signed)
Medication refill request for Imdur 30 mg tablets approved and sent to pharmacy. Pt has appt with provider 01/11.

## 2021-11-14 DIAGNOSIS — E1140 Type 2 diabetes mellitus with diabetic neuropathy, unspecified: Secondary | ICD-10-CM | POA: Diagnosis not present

## 2021-11-14 DIAGNOSIS — D509 Iron deficiency anemia, unspecified: Secondary | ICD-10-CM | POA: Diagnosis not present

## 2021-11-14 DIAGNOSIS — L4050 Arthropathic psoriasis, unspecified: Secondary | ICD-10-CM | POA: Diagnosis not present

## 2021-11-14 DIAGNOSIS — G4700 Insomnia, unspecified: Secondary | ICD-10-CM | POA: Diagnosis not present

## 2021-11-14 DIAGNOSIS — E039 Hypothyroidism, unspecified: Secondary | ICD-10-CM | POA: Diagnosis not present

## 2021-11-14 DIAGNOSIS — M810 Age-related osteoporosis without current pathological fracture: Secondary | ICD-10-CM | POA: Diagnosis not present

## 2021-11-14 DIAGNOSIS — I2510 Atherosclerotic heart disease of native coronary artery without angina pectoris: Secondary | ICD-10-CM | POA: Diagnosis not present

## 2021-11-14 DIAGNOSIS — I358 Other nonrheumatic aortic valve disorders: Secondary | ICD-10-CM | POA: Diagnosis not present

## 2021-11-14 DIAGNOSIS — K219 Gastro-esophageal reflux disease without esophagitis: Secondary | ICD-10-CM | POA: Diagnosis not present

## 2021-11-14 DIAGNOSIS — E559 Vitamin D deficiency, unspecified: Secondary | ICD-10-CM | POA: Diagnosis not present

## 2021-11-14 DIAGNOSIS — E782 Mixed hyperlipidemia: Secondary | ICD-10-CM | POA: Diagnosis not present

## 2021-11-14 DIAGNOSIS — B3731 Acute candidiasis of vulva and vagina: Secondary | ICD-10-CM | POA: Diagnosis not present

## 2022-01-07 ENCOUNTER — Inpatient Hospital Stay: Payer: HMO | Attending: Physician Assistant | Admitting: Physician Assistant

## 2022-01-07 ENCOUNTER — Encounter: Payer: Self-pay | Admitting: Physician Assistant

## 2022-01-07 ENCOUNTER — Inpatient Hospital Stay: Payer: HMO

## 2022-01-07 VITALS — BP 163/58 | HR 56 | Temp 97.9°F | Resp 16 | Wt 145.7 lb

## 2022-01-07 DIAGNOSIS — Z90710 Acquired absence of both cervix and uterus: Secondary | ICD-10-CM | POA: Insufficient documentation

## 2022-01-07 DIAGNOSIS — R5383 Other fatigue: Secondary | ICD-10-CM | POA: Diagnosis not present

## 2022-01-07 DIAGNOSIS — D696 Thrombocytopenia, unspecified: Secondary | ICD-10-CM | POA: Diagnosis not present

## 2022-01-07 DIAGNOSIS — I2510 Atherosclerotic heart disease of native coronary artery without angina pectoris: Secondary | ICD-10-CM | POA: Insufficient documentation

## 2022-01-07 DIAGNOSIS — K769 Liver disease, unspecified: Secondary | ICD-10-CM | POA: Insufficient documentation

## 2022-01-07 DIAGNOSIS — E611 Iron deficiency: Secondary | ICD-10-CM

## 2022-01-07 DIAGNOSIS — E039 Hypothyroidism, unspecified: Secondary | ICD-10-CM | POA: Diagnosis not present

## 2022-01-07 DIAGNOSIS — I10 Essential (primary) hypertension: Secondary | ICD-10-CM | POA: Diagnosis not present

## 2022-01-07 DIAGNOSIS — E538 Deficiency of other specified B group vitamins: Secondary | ICD-10-CM

## 2022-01-07 DIAGNOSIS — L4050 Arthropathic psoriasis, unspecified: Secondary | ICD-10-CM | POA: Insufficient documentation

## 2022-01-07 DIAGNOSIS — M1990 Unspecified osteoarthritis, unspecified site: Secondary | ICD-10-CM | POA: Diagnosis not present

## 2022-01-07 DIAGNOSIS — M797 Fibromyalgia: Secondary | ICD-10-CM | POA: Insufficient documentation

## 2022-01-07 DIAGNOSIS — E785 Hyperlipidemia, unspecified: Secondary | ICD-10-CM | POA: Diagnosis not present

## 2022-01-07 DIAGNOSIS — D61818 Other pancytopenia: Secondary | ICD-10-CM | POA: Insufficient documentation

## 2022-01-07 DIAGNOSIS — D509 Iron deficiency anemia, unspecified: Secondary | ICD-10-CM | POA: Diagnosis not present

## 2022-01-07 DIAGNOSIS — E1136 Type 2 diabetes mellitus with diabetic cataract: Secondary | ICD-10-CM | POA: Insufficient documentation

## 2022-01-07 LAB — CBC WITH DIFFERENTIAL/PLATELET
Abs Immature Granulocytes: 0.01 10*3/uL (ref 0.00–0.07)
Basophils Absolute: 0 10*3/uL (ref 0.0–0.1)
Basophils Relative: 1 %
Eosinophils Absolute: 0.1 10*3/uL (ref 0.0–0.5)
Eosinophils Relative: 3 %
HCT: 37.1 % (ref 36.0–46.0)
Hemoglobin: 12.8 g/dL (ref 12.0–15.0)
Immature Granulocytes: 0 %
Lymphocytes Relative: 24 %
Lymphs Abs: 1 10*3/uL (ref 0.7–4.0)
MCH: 34.1 pg — ABNORMAL HIGH (ref 26.0–34.0)
MCHC: 34.5 g/dL (ref 30.0–36.0)
MCV: 98.9 fL (ref 80.0–100.0)
Monocytes Absolute: 0.4 10*3/uL (ref 0.1–1.0)
Monocytes Relative: 10 %
Neutro Abs: 2.6 10*3/uL (ref 1.7–7.7)
Neutrophils Relative %: 62 %
Platelets: 92 10*3/uL — ABNORMAL LOW (ref 150–400)
RBC: 3.75 MIL/uL — ABNORMAL LOW (ref 3.87–5.11)
RDW: 14.3 % (ref 11.5–15.5)
Smear Review: DECREASED
WBC: 4.1 10*3/uL (ref 4.0–10.5)
nRBC: 0 % (ref 0.0–0.2)

## 2022-01-07 LAB — COMPREHENSIVE METABOLIC PANEL
ALT: 33 U/L (ref 0–44)
AST: 56 U/L — ABNORMAL HIGH (ref 15–41)
Albumin: 3.4 g/dL — ABNORMAL LOW (ref 3.5–5.0)
Alkaline Phosphatase: 99 U/L (ref 38–126)
Anion gap: 8 (ref 5–15)
BUN: 8 mg/dL (ref 8–23)
CO2: 22 mmol/L (ref 22–32)
Calcium: 8.5 mg/dL — ABNORMAL LOW (ref 8.9–10.3)
Chloride: 110 mmol/L (ref 98–111)
Creatinine, Ser: 0.64 mg/dL (ref 0.44–1.00)
GFR, Estimated: >60 mL/min (ref >60)
Glucose, Bld: 151 mg/dL — ABNORMAL HIGH (ref 70–99)
Potassium: 2.9 mmol/L — ABNORMAL LOW (ref 3.5–5.1)
Sodium: 140 mmol/L (ref 135–145)
Total Bilirubin: 1.9 mg/dL — ABNORMAL HIGH (ref 0.3–1.2)
Total Protein: 6.3 g/dL — ABNORMAL LOW (ref 6.5–8.1)

## 2022-01-07 LAB — IRON AND TIBC
Iron: 150 ug/dL (ref 28–170)
Saturation Ratios: 48 % — ABNORMAL HIGH (ref 10.4–31.8)
TIBC: 309 ug/dL (ref 250–450)
UIBC: 159 ug/dL

## 2022-01-07 LAB — LACTATE DEHYDROGENASE: LDH: 200 U/L — ABNORMAL HIGH (ref 98–192)

## 2022-01-07 LAB — FOLATE: Folate: 12 ng/mL (ref >5.9)

## 2022-01-07 LAB — FERRITIN: Ferritin: 65 ng/mL (ref 11–307)

## 2022-01-07 LAB — VITAMIN B12: Vitamin B-12: 1398 pg/mL — ABNORMAL HIGH (ref 180–914)

## 2022-01-07 NOTE — Progress Notes (Signed)
Rebecca Hartman, [place] [id number]   CLINIC:  Medical Oncology/Hematology  PCP:  Celene Squibb, MD [place] [address] [phone]   REASON FOR VISIT:  Follow-up for pancytopenia  PRIOR THERAPY: None  CURRENT THERAPY: Under work-up  INTERVAL HISTORY:  Rebecca Hartman 76 y.o. female returns for routine follow-up of her pancytopenia.  She was last seen by Tarri Abernethy PA-C on 09/09/2021.  At today's visit, she continues to report significant chronic fatigue.  She denies any changes or new complaints since her last visit.   She continues to have epistaxis associated with excessive sneezing, but reports that this has not been as frequent as it was during springtime.  Nosebleeds are more frequently from her right nostril than her left.  She reports that she has "large clots coming out of her nose when she sneezes."  She saw ENT follow-up in June 2023 with cauterization, which she reports did not help.  She continues to have chronic baseline fatigue.  No pica, lightheadedness, or syncope.  She has occasional chest pain (followed by cardiology).  She denies any recent headaches, palpitations, pica, lightheadedness, syncope or dyspnea on exertion.  She reports that her stool has been dark ever since she started iron pills, but she denies any hematemesis, hematochezia, melena, or hematuria.  She admits to easy bruising but denies petechial rash.    She had COVID-19 in August 2023.  No other recent or recurrent infections.  She has not noticed any new lumps or bumps.   She denies any B symptoms such as fever, chills, or night sweats.  She has lost about 35 pounds in the past year, 8 pounds in the past 4 months.  She reports decreased appetite and that she is not eating as much food as she used to.  She has been encouraged to try Ensure or Boost to improve her nutritional intake.  She is taking vitamin B12 supplement daily.  She is only able  to take her iron tablet 3 times weekly due to constipation.  She continues to follow-up with rheumatology (Dr. Trudie Reed) regarding her psoriatic arthritis. She remains on Enbrel at this time.  She reports that during previous instances when Enbrel was held, she had significant recurrence of her psoriatic rash and arthritis.  She has 50% energy and 75% appetite. She endorses that she is maintaining a stable weight.   REVIEW OF SYSTEMS:    Review of Systems  Constitutional:  Positive for fatigue. Negative for appetite change, chills, diaphoresis, fever and unexpected weight change.  HENT:   Negative for lump/mass, nosebleeds and trouble swallowing.   Eyes:  Negative for eye problems.  Respiratory:  Negative for cough, hemoptysis and shortness of breath.   Cardiovascular:  Positive for chest pain (Occasional, none today) and palpitations. Negative for leg swelling.  Gastrointestinal:  Positive for constipation. Negative for abdominal pain, blood in stool, diarrhea, nausea and vomiting.  Genitourinary:  Positive for bladder incontinence. Negative for hematuria.   Musculoskeletal:  Positive for arthralgias.  Skin:  Positive for rash (Psoriatic rash).  Neurological:  Positive for dizziness and numbness. Negative for headaches and light-headedness.  Hematological:  Does not bruise/bleed easily.  Psychiatric/Behavioral:  Positive for depression. The patient is nervous/anxious.       PAST MEDICAL/SURGICAL HISTORY:  Past Medical History:  Diagnosis Date   Anemia    Anxiety    Arthritis    Cataract    Chronic back pain  Coronary artery disease    Multivessel disease, status post DES to mid LAD 05/2020   Essential hypertension    Fibromyalgia    Hiatal hernia    History of blood transfusion    Hypercholesteremia    Hypothyroidism    Osteoporosis    Type 2 diabetes mellitus ([place])    Past Surgical History:  Procedure Laterality Date   ABDOMINAL HYSTERECTOMY     BACK SURGERY     C-EYE  SURGERY PROCEDURE     CARPAL TUNNEL RELEASE     CATARACT EXTRACTION     CHOLECYSTECTOMY     COLONOSCOPY  08/06/06 [facility]   COLONOSCOPY WITH PROPOFOL N/A 09/18/2021   Procedure: COLONOSCOPY WITH PROPOFOL;  Surgeon: Harvel Quale, MD;  [facility];  Service: Gastroenterology;  Laterality: N/A;  945   CORONARY STENT INTERVENTION  05/24/2020   CORONARY STENT INTERVENTION N/A 05/24/2020   Procedure: CORONARY STENT INTERVENTION;  Surgeon: Burnell Blanks, MD;  Location: [facility];  Service: Cardiovascular;  Laterality: N/A;   DENTAL SURGERY  04/2015   reconstructive surgery lower   ESOPHAGOGASTRODUODENOSCOPY  10/31/2010   Procedure: ESOPHAGOGASTRODUODENOSCOPY (EGD);  Surgeon: [name], MD;  [facility];  Service: Endoscopy;  Laterality: N/A;   ESOPHAGOGASTRODUODENOSCOPY  03/05/2011   Procedure: ESOPHAGOGASTRODUODENOSCOPY (EGD);  Surgeon: [name], MD;  [facility];  Service: Endoscopy;  Laterality: N/A;  12:00   ESOPHAGOGASTRODUODENOSCOPY  04/28/2012   Procedure: ESOPHAGOGASTRODUODENOSCOPY (EGD);  Surgeon: [name], MD;  [facility];  Service: Endoscopy;  Laterality: N/A;  140-moved to 10:30 Ann notified pt   ESOPHAGOGASTRODUODENOSCOPY (EGD) WITH PROPOFOL N/A 09/18/2021   Procedure: ESOPHAGOGASTRODUODENOSCOPY (EGD) WITH PROPOFOL;  Surgeon: Harvel Quale, MD;  [facility];  Service: Gastroenterology;  Laterality: N/A;   EYE SURGERY     INTRAVASCULAR IMAGING/OCT N/A 05/24/2020   Procedure: INTRAVASCULAR IMAGING/OCT;  Surgeon: Burnell Blanks, MD;  Location: [facility];  Service: Cardiovascular;  Laterality: N/A;   INTRAVASCULAR PRESSURE WIRE/FFR STUDY N/A 05/24/2020   Procedure: INTRAVASCULAR PRESSURE WIRE/FFR STUDY;  Surgeon: Burnell Blanks, MD;  Location: [facility];  Service: Cardiovascular;  Laterality: N/A;   LEFT HEART CATH AND CORONARY ANGIOGRAPHY  N/A 05/24/2020   Procedure: LEFT HEART CATH AND CORONARY ANGIOGRAPHY;  Surgeon: Burnell Blanks, MD;  Location: [facility];  Service: Cardiovascular;  Laterality: N/A;   TONSILLECTOMY     TOTAL HIP REVISION     UPPER GASTROINTESTINAL ENDOSCOPY  10/03/2010   EGD DILATN PYLORIC CHANNEL   UPPER GASTROINTESTINAL ENDOSCOPY  07/08/2010   UPPER GASTROINTESTINAL ENDOSCOPY  08/06/06   [facility]   UPPER GASTROINTESTINAL ENDOSCOPY  06/29/2008   EGD     SOCIAL HISTORY:  Social History   Socioeconomic History   Marital status: Married    Spouse name: Not on file   Number of children: Not on file   Years of education: Not on file   Highest education level: Not on file  Occupational History   Occupation: retired  Tobacco Use   Smoking status: Never   Smokeless tobacco: Never  Vaping Use   Vaping Use: Never used  Substance and Sexual Activity   Alcohol use: No    Alcohol/week: 0.0 standard drinks of alcohol   Drug use: No   Sexual activity: Yes    Birth control/protection: Post-menopausal  Other Topics Concern   Not on file  Social History Narrative   Not  on file   Social Determinants of Health   Financial Resource Strain: Not on file  Food Insecurity: Not on file  Transportation Needs: Not on file  Physical Activity: Not on file  Stress: Not on file  Social Connections: Not on file  Intimate Partner Violence: Not on file    FAMILY HISTORY:  Family History  Problem Relation Age of Onset   Diabetes Mother    Stroke Mother    Hyperlipidemia Mother    Hypertension Mother    Stroke Father    Heart disease Father    Hyperlipidemia Father    Hypertension Father    Healthy Son     CURRENT MEDICATIONS:  Outpatient Encounter Medications as of 01/07/2022  Medication Sig Note   ACCU-CHEK AVIVA PLUS test strip     acetaminophen (TYLENOL) 500 MG tablet Take 500-1,000 mg by mouth every 6 (six) hours as needed (pain).    atorvastatin (LIPITOR) 40 MG tablet Take 1  tablet (40 mg total) by mouth daily. (Patient taking differently: Take 40 mg by mouth every evening.)    calcium carbonate (TUMS - DOSED IN MG ELEMENTAL CALCIUM) 500 MG chewable tablet Chew 1-2 tablets by mouth 3 (three) times daily as needed for indigestion or heartburn.    cholecalciferol (VITAMIN D3) 25 MCG (1000 UNIT) tablet Take 1,000 Units by mouth in the morning.    clopidogrel (PLAVIX) 75 MG tablet Take 1 tablet (75 mg total) by mouth daily with breakfast. 09/17/2021: On hold due to upcoming procedure.    dicyclomine (BENTYL) 10 MG capsule Take 1 capsule (10 mg total) by mouth every 12 (twelve) hours as needed (abdominal pain).    diltiazem (CARDIZEM CD) 120 MG 24 hr capsule Take 1 capsule (120 mg total) by mouth daily. (Patient taking differently: Take 120 mg by mouth every evening.)    empagliflozin (JARDIANCE) 25 MG TABS tablet Take 25 mg by mouth in the morning.    escitalopram (LEXAPRO) 10 MG tablet Take 10 mg by mouth daily as needed (anxious).    etanercept (ENBREL) 50 MG/ML injection Inject 50 mg into the skin every Monday.    FEROSUL 325 (65 Fe) MG tablet Take 325 mg by mouth at bedtime. 09/17/2021: On hold due to upcoming procedure.   isosorbide mononitrate (IMDUR) 30 MG 24 hr tablet Take 1 tablet (30 mg total) by mouth daily.    JARDIANCE 25 MG TABS tablet Take 25 mg by mouth daily.    levothyroxine (SYNTHROID, LEVOTHROID) 50 MCG tablet Take 50 mcg by mouth daily before breakfast.    metoprolol tartrate (LOPRESSOR) 50 MG tablet TAKE (1) TABLET BY MOUTH TWICE DAILY.    nitroGLYCERIN (NITROSTAT) 0.4 MG SL tablet PLACE 1 TAB UNDER TONGUE EVERY 5 MIN IF NEEDED FOR CHEST PAIN. MAY USE 3 TIMES.NO RELIEF CALL 911.    pantoprazole (PROTONIX) 40 MG tablet Take 40 mg by mouth in the morning.    Polyethyl Glycol-Propyl Glycol (SYSTANE) 0.4-0.3 % SOLN Apply 1-2 drops to eye 4 (four) times daily as needed (dry/irritated eyes.).    temazepam (RESTORIL) 30 MG capsule Take 30 mg by mouth at  bedtime.    Vitamin A 2400 MCG (8000 UT) CAPS Take 2,400 mcg by mouth in the morning.    vitamin B-12 (CYANOCOBALAMIN) 1000 MCG tablet Take 1,000 mcg by mouth at bedtime.    No facility-administered encounter medications on file as of 01/07/2022.    ALLERGIES:  Allergies  Allergen Reactions   Shellfish Allergy Itching and Nausea And  Vomiting   Tussigon [Hydrocodone Bit-Homatrop Mbr]     Per patient's husband , this medication caused confusion.   Hydrocodone Itching   Glyburide     Sudden Drop in Blood Sugars   Levofloxacin Itching   Zestril [Lisinopril] Other (See Comments)    SEVERE DROP IN PRESSURE   Morphine Itching and Nausea Only     PHYSICAL EXAM:  ECOG PERFORMANCE STATUS: 2 - Symptomatic, <50% confined to bed    There were no vitals filed for this visit. There were no vitals filed for this visit. Physical Exam Constitutional:      Appearance: Normal appearance. She is obese.  HENT:     Head: Normocephalic and atraumatic.     Mouth/Throat:     Mouth: Mucous membranes are moist.  Eyes:     Extraocular Movements: Extraocular movements intact.     Pupils: Pupils are equal, round, and reactive to light.  Cardiovascular:     Rate and Rhythm: Regular rhythm. Bradycardia present.     Pulses: Normal pulses.     Heart sounds: Murmur heard.  Pulmonary:     Effort: Pulmonary effort is normal.     Breath sounds: Normal breath sounds.  Abdominal:     General: Bowel sounds are normal.     Palpations: Abdomen is soft.     Tenderness: There is no abdominal tenderness.  Musculoskeletal:        General: No swelling.     Right lower leg: No edema.     Left lower leg: No edema.  Lymphadenopathy:     Cervical: No cervical adenopathy.  Skin:    General: Skin is warm and dry.  Neurological:     General: No focal deficit present.     Mental Status: She is alert and oriented to person, place, and time.  Psychiatric:        Mood and Affect: Mood normal.        Behavior:  Behavior normal.     LABORATORY DATA:  I have reviewed the labs as listed.  CBC    Component Value Date/Time   WBC 4.9 09/09/2021 1124   RBC 3.99 09/09/2021 1124   HGB 13.0 09/09/2021 1124   HCT 38.2 09/09/2021 1124   PLT 85 (L) 09/09/2021 1124   MCV 95.7 09/09/2021 1124   MCH 32.6 09/09/2021 1124   MCHC 34.0 09/09/2021 1124   RDW 13.7 09/09/2021 1124   LYMPHSABS 1.0 09/03/2021 1049   MONOABS 0.4 09/03/2021 1049   EOSABS 0.2 09/03/2021 1049   BASOSABS 0.0 09/03/2021 1049      Latest Ref Rng & Units 05/14/2021   10:39 AM 05/25/2020    1:28 AM 05/17/2020    3:29 PM  CMP  Glucose 70 - 99 mg/dL 158  154  108   BUN 8 - 23 mg/dL 10  9  8    Creatinine 0.44 - 1.00 mg/dL 0.79  0.82  0.74   Sodium 135 - 145 mmol/L 137  129  132   Potassium 3.5 - 5.1 mmol/L 3.8  4.6  4.3   Chloride 98 - 111 mmol/L 107  100  104   CO2 22 - 32 mmol/L 19  21  23    Calcium 8.9 - 10.3 mg/dL 8.5  8.2  8.6   Total Protein 6.5 - 8.1 g/dL 6.0     Total Bilirubin 0.3 - 1.2 mg/dL 1.1     Alkaline Phos 38 - 126 U/L 89     AST 15 -  41 U/L 41     ALT 0 - 44 U/L 20       DIAGNOSTIC IMAGING:  I have independently reviewed the relevant imaging and discussed with the patient.  ASSESSMENT & PLAN: 1.  Thrombocytopenia (cirrhosis/splenomegaly), with history of drug-induced pancytopenia (resolved) - Seen at the request of rheumatologist (Dr. Gavin Pound) for evaluation of pancytopenia. - Per medical records sent by Dr. Trudie Reed, patient was noted to have onset of pancytopenia in November 2022. - She has been on methotrexate, Plaquenil, and Enbrel for psoriatic arthritis.   - Due to pancytopenia, methotrexate and Plaquenil were held by rheumatologist as of 02/18/2021.  She continues to take her Enbrel. - Hemoglobin and white blood cells returned to normal within 6 months of the discontinuation of methotrexate.  Platelets remained low. - Workup revealed Vitamin B12 deficiency, borderline iron deficiency, and  hemoccult positive stool.  Otherwise, unremarkable with normal flow cytometry and SPEP.  Negative hepatitis panel. - RUQ abdomen US (08/09/2021): Findings suspicious for hepatic cirrhosis but with normal hepatic elastography - Splenic ultrasound (09/16/2021): Splenomegaly with maximum dimension 15.3 cm -Most recent CBC (01/07/2022): Platelets 92, at baseline.  Hgb 12.8, WBC 4.1, normal differential.   - No B symptoms or frequent infections.      - No lymphadenopathy or splenomegaly palpated on exam.      - Mild pancytopenia has resolved after methotrexate was discontinued, but she has persistent moderate thrombocytopenia which is likely secondary to liver disease/splenomegaly and possible autoimmune thrombocytopenia versus medication effect. - PLAN: She has normal WBC and hemoglobin now, but persistent thrombocytopenia despite B12 and iron repletion.  Differential diagnosis and work-up for persistent thrombocytopenia includes: Thrombocytopenia related to liver disease and splenomegaly Autoimmune thrombocytopenia: We will consider trial of steroids if platelets <50 Medication effect: Unlikely to be secondary to Enbrel, although this may be a contributing factor.  Would not consider holding Enbrel unless platelets <50.  (Patient has previously held Enbrel in the past with significant recurrent psoriasis as a result) MDS or other bone marrow infiltrative process: No indication for bone marrow biopsy since other cell lines have normalized.  Would consider bone marrow biopsy if she had other cytopenias or development of B symptoms.  Would consider CT CAP in the presence of B symptoms due to increased risk of lymphoproliferative disorders in patients with autoimmune disease. -Repeat labs and RTC in 6  months.   2.  Mild anemia with marginal iron deficiency - History of GI bleed in 2012 due to stomach ulcer, required blood transfusions and iron infusions. -History of IV iron infusions, last given in June  2015 - Hemoccult stool POSITIVE x3 in February 2023 - EGD (09/18/2021): Grade 1 esophageal varices, portal hypertensive gastropathy - Colonoscopy (09/18/2021): Congested mucosa in ascending colon and rectum corresponding with portal colopathy - Labs (05/14/2021): Hgb 11.5/MCV 97.8, ferritin 38 with iron saturation 26% - Taking ferrous sulfate 325 mg 3 times weekly - Most recent labs (01/07/2022): Hgb 12.8/MCV 98.9.  Ferritin 65, iron saturation 48%. - Reports daily nosebleeds described as "large clots coming out of her nose when she sneezes".  She has been seen by ENT - No bright red blood per rectum, melena, or hematuria     - PLAN: Hemoglobin and iron stores improved.  Continue ferrous sulfate 325 mg 3 times weekly - Continue follow-up with GI and ENT - Repeat labs and RTC in 6 months    3.  Vitamin B12 deficiency - Labs from 05/14/2021 show normal vitamin B12 at  295, but elevated methylmalonic acid 938 - Vitamin B12 injection given on 06/04/2021 - Has been taking vitamin B12 1000 mcg daily since March 2023 - Most recent labs (01/07/2022): Vitamin B12 1398 with folate 12.0.  Homocystine and MMA pending. - PLAN: Continue vitamin B12 1000 mcg daily.     - Recheck vitamin B12/methylmalonic acid in 6 months   4.  Fatigue   - Chronic multifactorial fatigue related to fibromyalgia, autoimmune disease (psoriatic arthritis), physical deconditioning, and heart disease - She may also have some aspect of fatigue from her vitamin B12 and iron deficiency - she has had slightly improved energy after B12 and iron repletion  5.  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  No tobacco, alcohol, or illicit drug use. - FAMILY: Mother had strokes.  Father had heart problems. Brother had prostate cancer.   PLAN  SUMMARY & DISPOSITION:   Continue ENT and GI follow-up  B12 and iron repletion via OTC supplements Labs in 6 months   Office visit after labs  All questions were answered. The patient knows to call the clinic with any problems, questions or concerns.  Medical decision making: Moderate    Time spent on visit: I spent 20 minutes counseling the patient face to face. The total time spent in the appointment was 30 minutes and more than 50% was on counseling.   Harriett Rush, PA-C  01/07/2022 5:11 PM

## 2022-01-07 NOTE — Patient Instructions (Signed)
[facility] **   You were seen today by Tarri Abernethy PA-C for your follow-up visit.    LOW PLATELETS Your platelets today are stable at 92. Your low platelets are most likely related to your underlying liver disease and enlarged spleen.  They may also be related to your autoimmune disease.  IRON DEFICIENCY ANEMIA Your blood levels look great today! Your iron levels have not come back from the lab yet.  Continue to take your iron pill 3 days a week.  We will call and let you know if you need any IV iron.  LABS: Return in 6 months for repeat labs  MEDICATIONS: - Iron tablet 3 days a week - Vitamin B12 tablet daily  FOLLOW-UP APPOINTMENT: Office visit in 6 months, 1 week after labs  ** Thank you for trusting me with your healthcare!  I strive to provide all of my patients with quality care at each visit.  If you receive a survey for this visit, I would be so grateful to you for taking the time to provide feedback.  Thank you in advance!  ~ Dayane [place]                   Dr. Derek Jack   &   Tarri Abernethy, PA-C   - - - - - - - - - - - - - - - - - -    Thank you for choosing [place] at [facility] to provide your oncology and hematology care.  To afford each patient quality time with our provider, please arrive at least 15 minutes before your scheduled appointment time.   If you have a lab appointment with the [facility] please come in thru the Main Entrance and check in at the main information desk.  You need to re-schedule your appointment should you arrive 10 or more minutes late.  We strive to give you quality time with our providers, and arriving late affects you and other patients whose appointments are after yours.  Also, if you no show three or more times for appointments you may be dismissed from the clinic at the providers discretion.     Again, thank you for  choosing [facility].  Our hope is that these requests will decrease the amount of time that you wait before being seen by our physicians.       _____________________________________________________________  Should you have questions after your visit to [facility], please contact our office at [phone] and follow the prompts.  Our office hours are 8:00 a.m. and 4:30 p.m. Monday - Friday.  Please note that voicemails left after 4:00 p.m. may not be returned until the following business day.  We are closed weekends and major holidays.  You do have access to a nurse 24-7, just call the main number to the clinic [phone] and do not press any options, hold on the line and a nurse will answer the phone.    For prescription refill requests, have your pharmacy contact our office and allow 72 hours.

## 2022-01-08 LAB — HOMOCYSTEINE: Homocysteine: 9.7 umol/L (ref 0.0–19.2)

## 2022-01-10 LAB — METHYLMALONIC ACID, SERUM: Methylmalonic Acid, Quantitative: 194 nmol/L (ref 0–378)

## 2022-01-12 ENCOUNTER — Other Ambulatory Visit: Payer: Self-pay | Admitting: Cardiology

## 2022-02-17 ENCOUNTER — Encounter (INDEPENDENT_AMBULATORY_CARE_PROVIDER_SITE_OTHER): Payer: Self-pay | Admitting: Gastroenterology

## 2022-02-17 ENCOUNTER — Encounter (INDEPENDENT_AMBULATORY_CARE_PROVIDER_SITE_OTHER): Payer: Self-pay | Admitting: *Deleted

## 2022-02-17 ENCOUNTER — Ambulatory Visit (INDEPENDENT_AMBULATORY_CARE_PROVIDER_SITE_OTHER): Payer: HMO | Admitting: Gastroenterology

## 2022-02-17 VITALS — BP 151/68 | HR 56 | Temp 97.8°F | Ht 62.0 in | Wt 142.0 lb

## 2022-02-17 DIAGNOSIS — D509 Iron deficiency anemia, unspecified: Secondary | ICD-10-CM

## 2022-02-17 DIAGNOSIS — R7989 Other specified abnormal findings of blood chemistry: Secondary | ICD-10-CM | POA: Diagnosis not present

## 2022-02-17 DIAGNOSIS — K7460 Unspecified cirrhosis of liver: Secondary | ICD-10-CM

## 2022-02-17 DIAGNOSIS — I8500 Esophageal varices without bleeding: Secondary | ICD-10-CM | POA: Insufficient documentation

## 2022-02-17 DIAGNOSIS — I8510 Secondary esophageal varices without bleeding: Secondary | ICD-10-CM | POA: Diagnosis not present

## 2022-02-17 DIAGNOSIS — Z1159 Encounter for screening for other viral diseases: Secondary | ICD-10-CM

## 2022-02-17 DIAGNOSIS — K7682 Hepatic encephalopathy: Secondary | ICD-10-CM

## 2022-02-17 NOTE — Patient Instructions (Signed)
-   Perform blood workup - Schedule liver US - Reduce salt intake to <2 g per day - Can take Tylenol max of 2 g per day (650 mg q8h) for pain - Avoid NSAIDs for pain - Avoid eating raw oysters/shellfish - Protein shake (Ensure or Boost) every night before going to sleep - The patient was found to have elevated blood pressure when vital signs were checked in the office. The blood pressure was rechecked by the nursing staff and it was found be persistently elevated >140/90 mmHg. I personally advised to the patient to follow up closely with his PCP for hypertension control.

## 2022-02-17 NOTE — Progress Notes (Unsigned)
[name], M.D. Gastroenterology & Hepatology [facility] [address] [place], [place] [address]  Primary Care Physician: Celene Squibb, MD [address] [id number]  I will communicate my assessment and recommendations to the referring MD via EMR.  Problems: Compensated cirrhosis, possibly due to NASH G1 EV  Iron deficiency anemia  History of Present Illness: Rebecca Hartman is a 76 y.o. female with past medical history of coronary artery disease status post stent placement on Plavix, psoriatic arthritis,, hypertension, fibromyalgia, hyperlipidemia, hypothyroidism, type 2 diabetes, osteoporosis, who presents for follow up of IDA  The patient was last seen on 08/05/2021. At that time, the patient had an EGD and colonoscopy ordered for evaluation of iron insulin anemia with findings described below.  Had liver elastography performed on 08/09/2021 which showed nodular liver with a low kPa o 4.1 and IQR ratio 0.15.  She had a fibrosis test ordered to rule out cirrhosis but never performed this.  The patient denies having any nausea, vomiting, fever, chills, hematochezia, melena, hematemesis, abdominal distention, abdominal pain, diarrhea, jaundice, pruritus or weight loss.  Most recent labs from 01/07/2022 showed CMP with potassium 2.9, rest of electrolytes within normal limits, AST 56, ALT 33, total bili 1.9, albumin 3.4, alkaline phosphatase 99, iron 150, iron saturation 48%, ferritin 65, CBC with WBC 4.1, hemoglobin 12.8 and platelets 92,000.  Cirrhosis related questions: Hematemesis/coffee ground emesis: {NO (DEF) / YES:6682::"No"} History of variceal bleeding: {NO (DEF) / YES:6682::"No"} Abdominal pain: {NO (DEF) / YES:6682::"No"} Abdominal distention/worsening ascites{NO (DEF) / YES:6682::"No"} Fever/chills: {NO (DEF) / YES:6682::"No"} Episodes of confusion/disorientation: {NO (DEF) / YES:6682::"No"} Number of daily  bowel movements:*** Taking diuretics?: {NO (DEF) / YES:6682::"No"} Prior history of banding?: {NO (DEF) / YES:6682::"No"} Prior episodes of SBP: {NO (DEF) / YES:6682::"No"} Last time liver imaging was performed:*** [place] MELD score: not calculated today  Last EGD: 09/18/2021 - Grade I esophageal varices. - 3 cm hiatal hernia. - Gastroesophageal flap valve classified as Hill Grade IV (no fold, wide open lumen, hiatal hernia present). - Portal hypertensive gastropathy. - Normal examined duodenum. - No specimens collected.  Recommended repeat in 2 years  Last Colonoscopy: 09/18/2021 - Congested mucosa in the ascending colon and in the cecum. Portal colopathy. - The examination was otherwise normal. - Non-bleeding internal hemorrhoids.  Past Medical History: Past Medical History:  Diagnosis Date   Anemia    Anxiety    Arthritis    Cataract    Chronic back pain    Coronary artery disease    Multivessel disease, status post DES to mid LAD 05/2020   Essential hypertension    Fibromyalgia    Hiatal hernia    History of blood transfusion    Hypercholesteremia    Hypothyroidism    Osteoporosis    Type 2 diabetes mellitus (HCC)     Past Surgical History: Past Surgical History:  Procedure Laterality Date   ABDOMINAL HYSTERECTOMY     BACK SURGERY     C-EYE SURGERY PROCEDURE     CARPAL TUNNEL RELEASE     CATARACT EXTRACTION     CHOLECYSTECTOMY     COLONOSCOPY  08/06/06 [facility]DS   COLONOSCOPY WITH PROPOFOL N/A 09/18/2021   Procedure: COLONOSCOPY WITH PROPOFOL;  Surgeon: Harvel Quale, MD;  [facility];  Service: Gastroenterology;  Laterality: N/A;  945   CORONARY STENT INTERVENTION  05/24/2020   CORONARY STENT INTERVENTION N/A 05/24/2020   Procedure: CORONARY STENT INTERVENTION;  Surgeon: Angelena Form,  Annita Brod, MD;  Location: [facility];  Service: Cardiovascular;  Laterality: N/A;   DENTAL SURGERY  04/2015   reconstructive surgery lower    ESOPHAGOGASTRODUODENOSCOPY  10/31/2010   Procedure: ESOPHAGOGASTRODUODENOSCOPY (EGD);  Surgeon: [name], MD;  [facility]: [facility];  Service: Endoscopy;  Laterality: N/A;   ESOPHAGOGASTRODUODENOSCOPY  03/05/2011   Procedure: ESOPHAGOGASTRODUODENOSCOPY (EGD);  Surgeon: [name], MD;  [facility];  Service: Endoscopy;  Laterality: N/A;  12:00   ESOPHAGOGASTRODUODENOSCOPY  04/28/2012   Procedure: ESOPHAGOGASTRODUODENOSCOPY (EGD);  Surgeon: [name], MD;  [facility];  Service: Endoscopy;  Laterality: N/A;  140-moved to 10:30 Ann notified pt   ESOPHAGOGASTRODUODENOSCOPY (EGD) WITH PROPOFOL N/A 09/18/2021   Procedure: ESOPHAGOGASTRODUODENOSCOPY (EGD) WITH PROPOFOL;  Surgeon: Harvel Quale, MD;  [facility];  Service: Gastroenterology;  Laterality: N/A;   EYE SURGERY     INTRAVASCULAR IMAGING/OCT N/A 05/24/2020   Procedure: INTRAVASCULAR IMAGING/OCT;  Surgeon: Burnell Blanks, MD;  Location: [facility];  Service: Cardiovascular;  Laterality: N/A;   INTRAVASCULAR PRESSURE WIRE/FFR STUDY N/A 05/24/2020   Procedure: INTRAVASCULAR PRESSURE WIRE/FFR STUDY;  Surgeon: Burnell Blanks, MD;  Location: [facility];  Service: Cardiovascular;  Laterality: N/A;   LEFT HEART CATH AND CORONARY ANGIOGRAPHY N/A 05/24/2020   Procedure: LEFT HEART CATH AND CORONARY ANGIOGRAPHY;  Surgeon: Burnell Blanks, MD;  Location: [facility];  Service: Cardiovascular;  Laterality: N/A;   TONSILLECTOMY     TOTAL HIP REVISION     UPPER GASTROINTESTINAL ENDOSCOPY  10/03/2010   EGD DILATN PYLORIC CHANNEL   UPPER GASTROINTESTINAL ENDOSCOPY  07/08/2010   UPPER GASTROINTESTINAL ENDOSCOPY  08/06/06   FI[facility]   UPPER GASTROINTESTINAL ENDOSCOPY  06/29/2008   EGD    Family History: Family History  Problem Relation Age of Onset   Diabetes Mother    Stroke Mother    Hyperlipidemia Mother    Hypertension Mother    Stroke  Father    Heart disease Father    Hyperlipidemia Father    Hypertension Father    Healthy Son     Social History: Social History   Tobacco Use  Smoking Status Never  Smokeless Tobacco Never   Social History   Substance and Sexual Activity  Alcohol Use No   Alcohol/week: 0.0 standard drinks of alcohol   Social History   Substance and Sexual Activity  Drug Use No    Allergies: Allergies  Allergen Reactions   Shellfish Allergy Itching and Nausea And Vomiting   Tussigon [Hydrocodone Bit-Homatrop Mbr]     Per patient's husband , this medication caused confusion.   Hydrocodone Itching   Glyburide     Sudden Drop in Blood Sugars   Levofloxacin Itching   Zestril [Lisinopril] Other (See Comments)    SEVERE DROP IN PRESSURE   Morphine Itching and Nausea Only    Medications: Current Outpatient Medications  Medication Sig Dispense Refill   ACCU-CHEK AVIVA PLUS test strip      acetaminophen (TYLENOL) 500 MG tablet Take 500-1,000 mg by mouth every 6 (six) hours as needed (pain).     atorvastatin (LIPITOR) 40 MG tablet Take 1 tablet (40 mg total) by mouth daily. (Patient taking differently: Take 40 mg by mouth every evening.) 90 tablet 3   calcium carbonate (TUMS - DOSED IN MG ELEMENTAL CALCIUM) 500 MG chewable tablet Chew 1-2 tablets by mouth 3 (three) times daily as needed for indigestion or heartburn.  cholecalciferol (VITAMIN D3) 25 MCG (1000 UNIT) tablet Take 1,000 Units by mouth in the morning.     clopidogrel (PLAVIX) 75 MG tablet Take 1 tablet (75 mg total) by mouth daily with breakfast. 90 tablet 3   dicyclomine (BENTYL) 10 MG capsule Take 1 capsule (10 mg total) by mouth every 12 (twelve) hours as needed (abdominal pain). 90 capsule 1   diltiazem (CARDIZEM CD) 120 MG 24 hr capsule Take 1 capsule (120 mg total) by mouth daily. (Patient taking differently: Take 120 mg by mouth every evening.) 90 capsule 3   empagliflozin (JARDIANCE) 25 MG TABS tablet Take 25 mg by  mouth in the morning.     escitalopram (LEXAPRO) 10 MG tablet Take 10 mg by mouth daily as needed (anxious).     etanercept (ENBREL) 50 MG/ML injection Inject 50 mg into the skin every Monday.     FEROSUL 325 (65 Fe) MG tablet Take 325 mg by mouth 3 (three) times a week.     isosorbide mononitrate (IMDUR) 30 MG 24 hr tablet Take 1 tablet (30 mg total) by mouth daily. 90 tablet 3   JARDIANCE 25 MG TABS tablet Take 25 mg by mouth daily.     levothyroxine (SYNTHROID, LEVOTHROID) 50 MCG tablet Take 50 mcg by mouth daily before breakfast.     metoprolol tartrate (LOPRESSOR) 50 MG tablet TAKE (1) TABLET BY MOUTH TWICE DAILY. 180 tablet 0   nitroGLYCERIN (NITROSTAT) 0.4 MG SL tablet PLACE 1 TAB UNDER TONGUE EVERY 5 MIN IF NEEDED FOR CHEST PAIN. MAY USE 3 TIMES.NO RELIEF CALL 911. 25 tablet 3   pantoprazole (PROTONIX) 40 MG tablet Take 40 mg by mouth in the morning.     Polyethyl Glycol-Propyl Glycol (SYSTANE) 0.4-0.3 % SOLN Apply 1-2 drops to eye 4 (four) times daily as needed (dry/irritated eyes.).     temazepam (RESTORIL) 30 MG capsule Take 30 mg by mouth at bedtime.     Vitamin A 2400 MCG (8000 UT) CAPS Take 2,400 mcg by mouth in the morning.     vitamin B-12 (CYANOCOBALAMIN) 1000 MCG tablet Take 1,000 mcg by mouth 3 (three) times a week.     No current facility-administered medications for this visit.    Review of Systems: GENERAL: negative for malaise, night sweats HEENT: No changes in hearing or vision, no nose bleeds or other nasal problems. NECK: Negative for lumps, goiter, pain and significant neck swelling RESPIRATORY: Negative for cough, wheezing CARDIOVASCULAR: Negative for chest pain, leg swelling, palpitations, orthopnea GI: SEE HPI MUSCULOSKELETAL: Negative for joint pain or swelling, back pain, and muscle pain. SKIN: Negative for lesions, rash PSYCH: Negative for sleep disturbance, mood disorder and recent psychosocial stressors. HEMATOLOGY Negative for prolonged bleeding,  bruising easily, and swollen nodes. ENDOCRINE: Negative for cold or heat intolerance, polyuria, polydipsia and goiter. NEURO: negative for tremor, gait imbalance, syncope and seizures. The remainder of the review of systems is noncontributory.   Physical Exam: BP (!) 151/68 (BP Location: Left Arm, Patient Position: Sitting, Cuff Size: Large)   Pulse (!) 56   Temp 97.8 F (36.6 C) (Temporal)   Ht '5\' 2"'$  (1.575 m)   Wt 142 lb (64.4 kg)   BMI 25.97 kg/m  GENERAL: The patient is AO x3, in no acute distress. HEENT: Head is normocephalic and atraumatic. EOMI are intact. Mouth is well hydrated and without lesions. NECK: Supple. No masses LUNGS: Clear to auscultation. No presence of rhonchi/wheezing/rales. Adequate chest expansion HEART: RRR, normal s1 and s2. ABDOMEN: Soft,  nontender, no guarding, no peritoneal signs, and nondistended. BS +. No masses. RECTAL EXAM: no external lesions, normal tone, no masses, brown stool without blood.*** Chaperone: EXTREMITIES: Without any cyanosis, clubbing, rash, lesions or edema. NEUROLOGIC: AOx3, no focal motor deficit. SKIN: no jaundice, no rashes  Imaging/Labs: as above  I personally reviewed and interpreted the available labs, imaging and endoscopic files.  Impression and Plan: Rebecca Hartman is a 76 y.o. female coming for follow up of ***  The patient was found to have elevated blood pressure when vital signs were checked in the office. The blood pressure was rechecked by the nursing staff and it was found be persistently elevated >140/90 mmHg. I personally advised to the patient to follow up closely with his PCP for hypertension control.  All questions were answered.      [name], MD Gastroenterology and Hepatology [facility]

## 2022-02-17 NOTE — Progress Notes (Unsigned)
Was found to have B12 deficiency and is receiving oral B12 supplementation. She is now on oral iron supplementation every other day.  The patient reports that she has lost significant amount of weight as she gets full easily, which ahs been ongoing for the last 3 years  Her husband has also noticed some increased forgeetfulness for the last year and a half.  Cirrhosis related questions: Hematemesis/coffee ground emesis: {NO (DEF) / YES:6682::"No"} History of variceal bleeding: {NO (DEF) / YES:6682::"No"} Abdominal pain: {NO (DEF) / YES:6682::"No"} Abdominal distention/worsening ascites{NO (DEF) / YES:6682::"No"} Fever/chills: {NO (DEF) / YES:6682::"No"} Episodes of confusion/disorientation: No disorientation but she has been forgetful for the last 1.5 year Number of daily bowel movements: has 1 Bm every 1-2 days Taking diuretics?: No Prior history of banding?: No Prior episodes of SBP: No Last time liver imaging was performed:*** [place] MELD score: ***

## 2022-02-18 DIAGNOSIS — K7682 Hepatic encephalopathy: Secondary | ICD-10-CM | POA: Insufficient documentation

## 2022-02-18 MED ORDER — LACTULOSE 10 G PO PACK: 10.0000 g | PACK | Freq: Every day | ORAL | 1 refills | Status: DC

## 2022-02-19 ENCOUNTER — Other Ambulatory Visit (INDEPENDENT_AMBULATORY_CARE_PROVIDER_SITE_OTHER): Payer: Self-pay | Admitting: Gastroenterology

## 2022-02-19 ENCOUNTER — Telehealth (INDEPENDENT_AMBULATORY_CARE_PROVIDER_SITE_OTHER): Payer: Self-pay

## 2022-02-19 DIAGNOSIS — K7682 Hepatic encephalopathy: Secondary | ICD-10-CM

## 2022-02-19 MED ORDER — LACTULOSE 10 GM/15ML PO SOLN: 20.0000 g | Freq: Every day | ORAL | 1 refills | Status: AC

## 2022-02-19 NOTE — Telephone Encounter (Signed)
Medication sent.

## 2022-02-19 NOTE — Telephone Encounter (Signed)
Rebecca Hartman from [facility] [phone] called states they can not get the Lactulose packet, they would like for you to send in the Lactulose in  liquid form to them please.

## 2022-03-05 ENCOUNTER — Ambulatory Visit (HOSPITAL_COMMUNITY): Payer: PPO

## 2022-03-25 ENCOUNTER — Other Ambulatory Visit (HOSPITAL_COMMUNITY): Payer: Self-pay | Admitting: Internal Medicine

## 2022-03-25 DIAGNOSIS — M810 Age-related osteoporosis without current pathological fracture: Secondary | ICD-10-CM

## 2022-03-26 ENCOUNTER — Encounter: Payer: Self-pay | Admitting: Internal Medicine

## 2022-03-26 ENCOUNTER — Telehealth (INDEPENDENT_AMBULATORY_CARE_PROVIDER_SITE_OTHER): Payer: Self-pay | Admitting: Gastroenterology

## 2022-03-26 NOTE — Telephone Encounter (Signed)
Hi crystal, Please ask her to perform the labs ordered on 02/17/22 Thanks

## 2022-03-27 ENCOUNTER — Encounter (INDEPENDENT_AMBULATORY_CARE_PROVIDER_SITE_OTHER): Payer: Self-pay

## 2022-03-27 NOTE — Telephone Encounter (Signed)
Sent message through My Chart asking patient to have lab drawn at [facility] lab.

## 2022-04-10 ENCOUNTER — Ambulatory Visit: Payer: PPO | Attending: Cardiology | Admitting: Cardiology

## 2022-04-10 ENCOUNTER — Encounter: Payer: Self-pay | Admitting: Cardiology

## 2022-04-10 VITALS — BP 142/80 | HR 59 | Ht 63.0 in | Wt 139.6 lb

## 2022-04-10 DIAGNOSIS — I25119 Atherosclerotic heart disease of native coronary artery with unspecified angina pectoris: Secondary | ICD-10-CM

## 2022-04-10 DIAGNOSIS — R011 Cardiac murmur, unspecified: Secondary | ICD-10-CM

## 2022-04-10 MED ORDER — NITROGLYCERIN 0.4 MG SL SUBL: SUBLINGUAL_TABLET | SUBLINGUAL | 3 refills | Status: AC

## 2022-04-10 NOTE — Progress Notes (Signed)
Cardiology Office Note  Date: 04/10/2022   ID: Rebecca Hartman, DOB 04/20/1945, MRN [id number]  PCP:  Celene Squibb, MD  Cardiologist:  Rozann Lesches, MD Electrophysiologist:  None   Chief Complaint  Patient presents with   Cardiac follow-up    History of Present Illness: Rebecca Hartman is a 77 y.o. female last seen in July 2022.  She presents overdue for follow-up, is here with her husband today.  She reports occasional nitroglycerin use, no accelerating symptoms.  Stable NYHA class II dyspnea.  Reports chronic fatigue as before which is likely multifactorial.  We went over her medications which are stable from a cardiac perspective.  She continues on Lipitor with last LDL 22.  I reviewed her last echocardiogram from 2021, we discussed getting an updated study.  She does not report any progressive ischemic symptoms to necessitate follow-up ischemic testing at this time.  Past Medical History:  Diagnosis Date   Anemia    Anxiety    Arthritis    Cataract    Chronic back pain    Coronary artery disease    Multivessel disease, status post DES to mid LAD 05/2020   Essential hypertension    Fibromyalgia    Hiatal hernia    History of blood transfusion    Hypercholesteremia    Hypothyroidism    Osteoporosis    Type 2 diabetes mellitus (HCC)     Current Outpatient Medications  Medication Sig Dispense Refill   ACCU-CHEK AVIVA PLUS test strip      acetaminophen (TYLENOL) 500 MG tablet Take 500-1,000 mg by mouth every 6 (six) hours as needed (pain).     atorvastatin (LIPITOR) 40 MG tablet Take 1 tablet (40 mg total) by mouth daily. (Patient taking differently: Take 40 mg by mouth every evening.) 90 tablet 3   calcium carbonate (TUMS - DOSED IN MG ELEMENTAL CALCIUM) 500 MG chewable tablet Chew 1-2 tablets by mouth 3 (three) times daily as needed for indigestion or heartburn.     cholecalciferol (VITAMIN D3) 25 MCG (1000 UNIT) tablet Take 1,000 Units by mouth in the morning.      clopidogrel (PLAVIX) 75 MG tablet Take 1 tablet (75 mg total) by mouth daily with breakfast. 90 tablet 3   dicyclomine (BENTYL) 10 MG capsule Take 1 capsule (10 mg total) by mouth every 12 (twelve) hours as needed (abdominal pain). 90 capsule 1   diltiazem (CARDIZEM CD) 120 MG 24 hr capsule Take 1 capsule (120 mg total) by mouth daily. (Patient taking differently: Take 120 mg by mouth every evening.) 90 capsule 3   empagliflozin (JARDIANCE) 25 MG TABS tablet Take 25 mg by mouth in the morning.     escitalopram (LEXAPRO) 10 MG tablet Take 10 mg by mouth daily as needed (anxious).     etanercept (ENBREL) 50 MG/ML injection Inject 50 mg into the skin every Monday.     FEROSUL 325 (65 Fe) MG tablet Take 325 mg by mouth 3 (three) times a week.     isosorbide mononitrate (IMDUR) 30 MG 24 hr tablet Take 1 tablet (30 mg total) by mouth daily. 90 tablet 3   JARDIANCE 25 MG TABS tablet Take 25 mg by mouth daily.     lactulose (CHRONULAC) 10 GM/15ML solution Take 30 mLs (20 g total) by mouth daily. 5400 mL 1   levothyroxine (SYNTHROID, LEVOTHROID) 50 MCG tablet Take 50 mcg by mouth daily before breakfast.     metoprolol tartrate (LOPRESSOR) 50 MG tablet  TAKE (1) TABLET BY MOUTH TWICE DAILY. 180 tablet 0   pantoprazole (PROTONIX) 40 MG tablet Take 40 mg by mouth in the morning.     Polyethyl Glycol-Propyl Glycol (SYSTANE) 0.4-0.3 % SOLN Apply 1-2 drops to eye 4 (four) times daily as needed (dry/irritated eyes.).     temazepam (RESTORIL) 30 MG capsule Take 30 mg by mouth at bedtime.     Vitamin A 2400 MCG (8000 UT) CAPS Take 2,400 mcg by mouth in the morning.     vitamin B-12 (CYANOCOBALAMIN) 1000 MCG tablet Take 1,000 mcg by mouth 3 (three) times a week.     nitroGLYCERIN (NITROSTAT) 0.4 MG SL tablet PLACE 1 TAB UNDER TONGUE EVERY 5 MIN IF NEEDED FOR CHEST PAIN. MAY USE 3 TIMES.NO RELIEF CALL 911. 25 tablet 3   No current facility-administered medications for this visit.   Allergies:  Shellfish allergy,  Tussigon [hydrocodone bit-homatrop mbr], Hydrocodone, Glyburide, Levofloxacin, Zestril [lisinopril], and Morphine   ROS: No palpitations or syncope.  Physical Exam: VS:  BP (!) 142/80   Pulse (!) 59   Ht '5\' 3"'$  (1.6 m)   Wt 139 lb 9.6 oz (63.3 kg)   SpO2 98%   BMI 24.73 kg/m , BMI Body mass index is 24.73 kg/m.  Wt Readings from Last 3 Encounters:  04/10/22 139 lb 9.6 oz (63.3 kg)  02/17/22 142 lb (64.4 kg)  01/07/22 145 lb 11.2 oz (66.1 kg)    General: Patient appears comfortable at rest. HEENT: Conjunctiva and lids normal. Neck: Supple, no elevated JVP or carotid bruits. Lungs: Clear to auscultation, nonlabored breathing at rest. Cardiac: Regular rate and rhythm, no S3, 2/6 systolic and diastolic murmurs, no pericardial rub. Extremities: No pitting edema.  ECG:  An ECG dated 09/11/2021 was personally reviewed today and demonstrated:  Sinus bradycardia with increased voltage.  Recent Labwork: 01/07/2022: ALT 33; AST 56; BUN 8; Creatinine, Ser 0.64; Hemoglobin 12.8; Platelets 92; Potassium 2.9; Sodium 140     Component Value Date/Time   CHOL 115 04/16/2018 0000   TRIG 93 04/16/2018 0000   HDL 39 04/16/2018 0000   LDLCALC 58 04/16/2018 0000  December 2023: TSH 1.08, hemoglobin 12.5, platelets 84, BUN 5, creatinine 0.71, potassium 3.2, AST 51, ALT 27, cholesterol 69, triglycerides 47, HDL 34, LDL 22  Other Studies Reviewed Today:  Cardiac catheterization 05/24/2020: Mid RCA lesion is 60% stenosed. Ost Cx to Prox Cx lesion is 65% stenosed. Dist Cx lesion is 60% stenosed. Mid LAD lesion is 90% stenosed. A drug-eluting stent was successfully placed using a STENT RESOLUTE ONYX 3.0X18. Post intervention, there is a 0% residual stenosis.   1. Severe stenosis mid LAD 2. Successful OCT guided PCI/DES x 1 mid LAD 3. Moderate, heavily calcified ostial Circumflex stenosis.  4. Moderate mid RCA stenosis  Assessment and Plan:  1.  CAD status post DES to the mid LAD in February  2022 with medically managed residual circumflex and RCA stenosis.  She reports no progressive angina at this time and remains on medical therapy including Plavix, Lipitor, Cardizem CD, Jardiance, Imdur, Lopressor, and as needed nitroglycerin which will be refilled.   2.  Heart murmur, plan to update echocardiogram in comparison to 2021.  She had a moderately sclerotic aortic valve with trivial aortic regurgitation at that time.  Medication Adjustments/Labs and Tests Ordered: Current medicines are reviewed at length with the patient today.  Concerns regarding medicines are outlined above.   Tests Ordered: Orders Placed This Encounter  Procedures   ECHOCARDIOGRAM  COMPLETE    Medication Changes: Meds ordered this encounter  Medications   nitroGLYCERIN (NITROSTAT) 0.4 MG SL tablet    Sig: PLACE 1 TAB UNDER TONGUE EVERY 5 MIN IF NEEDED FOR CHEST PAIN. MAY USE 3 TIMES.NO RELIEF CALL 911.    Dispense:  25 tablet    Refill:  3    Disposition:  Follow up  6 months.  Signed, Satira Sark, MD, [facility] 04/10/2022 2:46 PM    [facility] [address], [place], [place] [address] Phone: [phone]; Fax: [phone]

## 2022-04-10 NOTE — Patient Instructions (Signed)
Medication Instructions:  Your physician recommends that you continue on your current medications as directed. Please refer to the Current Medication list given to you today.   I refilled your NTG  Labwork: None today  Testing/Procedures: Your physician has requested that you have an echocardiogram. Echocardiography is a painless test that uses sound waves to create images of your heart. It provides your doctor with information about the size and shape of your heart and how well your heart's chambers and valves are working. This procedure takes approximately one hour. There are no restrictions for this procedure. Please do NOT wear cologne, perfume, aftershave, or lotions (deodorant is allowed). Please arrive 15 minutes prior to your appointment time.   Follow-Up: 6 months  Any Other Special Instructions Will Be Listed Below (If Applicable).  If you need a refill on your cardiac medications before your next appointment, please call your pharmacy.

## 2022-04-17 ENCOUNTER — Ambulatory Visit (HOSPITAL_COMMUNITY)
Admission: RE | Admit: 2022-04-17 | Discharge: 2022-04-17 | Disposition: A | Discharge status: Home / Self Care | Payer: PPO | Source: Ambulatory Visit | Attending: Cardiology | Admitting: Cardiology

## 2022-04-17 DIAGNOSIS — R011 Cardiac murmur, unspecified: Secondary | ICD-10-CM | POA: Diagnosis not present

## 2022-04-17 LAB — ECHOCARDIOGRAM COMPLETE
AR max vel: 2.3 cm2
AV Area VTI: 2.18 cm2
AV Area mean vel: 2.17 cm2
AV Mean grad: 7.3 mmHg
AV Peak grad: 15.2 mmHg
Ao pk vel: 1.95 m/s
Area-P 1/2: 2.26 cm2
MV VTI: 2.36 cm2
P 1/2 time: 413 msec
S' Lateral: 3.3 cm

## 2022-04-17 NOTE — Progress Notes (Signed)
*  PRELIMINARY RESULTS* Echocardiogram 2D Echocardiogram  has been performed.  Rebecca Hartman 04/17/2022, 3:55 PM

## 2022-04-18 ENCOUNTER — Telehealth: Payer: Self-pay

## 2022-04-18 NOTE — Telephone Encounter (Signed)
-----  Message from Satira Sark, MD sent at 04/17/2022  4:45 PM EST ----- Results reviewed.  Follow-up echocardiogram shows normal LVEF at 60 to 65%.  Aortic valve is sclerotic but not stenotic.  There is moderate aortic regurgitation.  Keep follow-up plan in place.

## 2022-04-18 NOTE — Telephone Encounter (Signed)
Patient notified and verbalized understanding. Patient had no questions or concerns at this time. PCP copied 

## 2022-07-08 ENCOUNTER — Inpatient Hospital Stay: Payer: PPO | Attending: Hematology

## 2022-07-08 ENCOUNTER — Other Ambulatory Visit: Payer: Self-pay | Admitting: Physician Assistant

## 2022-07-08 DIAGNOSIS — E876 Hypokalemia: Secondary | ICD-10-CM

## 2022-07-08 DIAGNOSIS — D6959 Other secondary thrombocytopenia: Secondary | ICD-10-CM | POA: Insufficient documentation

## 2022-07-08 DIAGNOSIS — E611 Iron deficiency: Secondary | ICD-10-CM

## 2022-07-08 DIAGNOSIS — E538 Deficiency of other specified B group vitamins: Secondary | ICD-10-CM | POA: Insufficient documentation

## 2022-07-08 DIAGNOSIS — I10 Essential (primary) hypertension: Secondary | ICD-10-CM | POA: Diagnosis not present

## 2022-07-08 DIAGNOSIS — R5383 Other fatigue: Secondary | ICD-10-CM | POA: Diagnosis not present

## 2022-07-08 DIAGNOSIS — R161 Splenomegaly, not elsewhere classified: Secondary | ICD-10-CM | POA: Insufficient documentation

## 2022-07-08 DIAGNOSIS — D649 Anemia, unspecified: Secondary | ICD-10-CM | POA: Insufficient documentation

## 2022-07-08 DIAGNOSIS — E119 Type 2 diabetes mellitus without complications: Secondary | ICD-10-CM | POA: Insufficient documentation

## 2022-07-08 DIAGNOSIS — R195 Other fecal abnormalities: Secondary | ICD-10-CM | POA: Diagnosis not present

## 2022-07-08 DIAGNOSIS — D696 Thrombocytopenia, unspecified: Secondary | ICD-10-CM

## 2022-07-08 DIAGNOSIS — D61818 Other pancytopenia: Secondary | ICD-10-CM | POA: Insufficient documentation

## 2022-07-08 DIAGNOSIS — Z8042 Family history of malignant neoplasm of prostate: Secondary | ICD-10-CM | POA: Insufficient documentation

## 2022-07-08 LAB — CBC WITH DIFFERENTIAL/PLATELET
Abs Immature Granulocytes: 0.01 10*3/uL (ref 0.00–0.07)
Basophils Absolute: 0 10*3/uL (ref 0.0–0.1)
Basophils Relative: 1 %
Eosinophils Absolute: 0.3 10*3/uL (ref 0.0–0.5)
Eosinophils Relative: 6 %
HCT: 36.6 % (ref 36.0–46.0)
Hemoglobin: 12.4 g/dL (ref 12.0–15.0)
Immature Granulocytes: 0 %
Lymphocytes Relative: 20 %
Lymphs Abs: 1 10*3/uL (ref 0.7–4.0)
MCH: 34.7 pg — ABNORMAL HIGH (ref 26.0–34.0)
MCHC: 33.9 g/dL (ref 30.0–36.0)
MCV: 102.5 fL — ABNORMAL HIGH (ref 80.0–100.0)
Monocytes Absolute: 0.5 10*3/uL (ref 0.1–1.0)
Monocytes Relative: 9 %
Neutro Abs: 3.4 10*3/uL (ref 1.7–7.7)
Neutrophils Relative %: 64 %
Platelets: 91 10*3/uL — ABNORMAL LOW (ref 150–400)
RBC: 3.57 MIL/uL — ABNORMAL LOW (ref 3.87–5.11)
RDW: 15 % (ref 11.5–15.5)
WBC: 5.2 10*3/uL (ref 4.0–10.5)
nRBC: 0.4 % — ABNORMAL HIGH (ref 0.0–0.2)

## 2022-07-08 LAB — LACTATE DEHYDROGENASE: LDH: 220 U/L — ABNORMAL HIGH (ref 98–192)

## 2022-07-08 LAB — COMPREHENSIVE METABOLIC PANEL
ALT: 23 U/L (ref 0–44)
AST: 44 U/L — ABNORMAL HIGH (ref 15–41)
Albumin: 3 g/dL — ABNORMAL LOW (ref 3.5–5.0)
Alkaline Phosphatase: 71 U/L (ref 38–126)
Anion gap: 7 (ref 5–15)
BUN: 6 mg/dL — ABNORMAL LOW (ref 8–23)
CO2: 20 mmol/L — ABNORMAL LOW (ref 22–32)
Calcium: 8.1 mg/dL — ABNORMAL LOW (ref 8.9–10.3)
Chloride: 112 mmol/L — ABNORMAL HIGH (ref 98–111)
Creatinine, Ser: 0.69 mg/dL (ref 0.44–1.00)
GFR, Estimated: >60 mL/min (ref >60)
Glucose, Bld: 199 mg/dL — ABNORMAL HIGH (ref 70–99)
Potassium: 2.6 mmol/L — CL (ref 3.5–5.1)
Sodium: 139 mmol/L (ref 135–145)
Total Bilirubin: 2.7 mg/dL — ABNORMAL HIGH (ref 0.3–1.2)
Total Protein: 5.7 g/dL — ABNORMAL LOW (ref 6.5–8.1)

## 2022-07-08 LAB — FERRITIN: Ferritin: 132 ng/mL (ref 11–307)

## 2022-07-08 LAB — IRON AND TIBC
Iron: 142 ug/dL (ref 28–170)
Saturation Ratios: 62 % — ABNORMAL HIGH (ref 10.4–31.8)
TIBC: 228 ug/dL — ABNORMAL LOW (ref 250–450)
UIBC: 86 ug/dL

## 2022-07-08 LAB — VITAMIN B12: Vitamin B-12: 1282 pg/mL — ABNORMAL HIGH (ref 180–914)

## 2022-07-08 LAB — FOLATE: Folate: 7.3 ng/mL (ref >5.9)

## 2022-07-08 MED ORDER — POTASSIUM CHLORIDE CRYS ER 20 MEQ PO TBCR
EXTENDED_RELEASE_TABLET | ORAL | 0 refills | Status: DC
Start: 2022-07-08 — End: 2022-08-18

## 2022-07-08 NOTE — Progress Notes (Signed)
CRITICAL VALUE STICKER  CRITICAL VALUE: Potassium 2.6  DATE & TIME NOTIFIED: 07/08/2022 @ 14:19  MESSENGER (representative from lab): Chelsea  MD NOTIFIED: Rojelio Brenner, PA-C  TIME OF NOTIFICATION: 14:20  RESPONSE:  Message from Rojelio Brenner, PA-C -  I have sent Rx for potassium 40 mEq twice daily x 3 days, followed by potassium 20 mEq daily. - Please give strict instructions that if she is not able to pick this up and take her first dose today, she should proceed to the emergency department for IV repletion. - Please give strict instructions to call PCP TODAY to schedule additional laboratory follow-up, testing, and treatment per PCP.   Patient aware and is agreeable with plan. However she wants [place] to call Dr. Margo Aye and discuss this with him. Rojelio Brenner, PA- C made aware.

## 2022-07-08 NOTE — Progress Notes (Signed)
Potassium 2.6 with labs today  Prescription sent for potassium 40 mEq twice daily x 3 days, followed by potassium 20 mEq daily. Patient given strict instructions that if she is not able to pick this up and take her first dose today, she should proceed to the emergency department for IV repletion. Patient given strict instructions to call PCP TODAY to schedule additional laboratory follow-up, testing, and treatment per PCP.  ** Above message delegated to LPN Chasity Edwards to call patient with instructions.  Carnella Guadalajara, PA-C  07/08/22 2:28 PM

## 2022-07-09 LAB — HOMOCYSTEINE: Homocysteine: 14 umol/L (ref 0.0–19.2)

## 2022-07-14 LAB — METHYLMALONIC ACID, SERUM: Methylmalonic Acid, Quantitative: 221 nmol/L (ref 0–378)

## 2022-07-14 NOTE — Progress Notes (Unsigned)
[name] [address], [place] [address]   CLINIC:  Medical Oncology/Hematology  PCP:  [name], MD [name] [address] [phone]   REASON FOR VISIT:  Follow-up for anemia and thrombocytopenia  PRIOR THERAPY: None  CURRENT THERAPY: Surveillance  INTERVAL HISTORY:   Rebecca Hartman 77 y.o. female returns for routine follow-up of anemia and thrombocytopenia.  She was last seen by Rojelio Brenner PA-C on 01/07/2022.  At today's visit, she reports feeling fair apart from fatigue.  No recent hospitalizations, surgeries, or changes in baseline health status.  Labs last week showed severe hypokalemia (potassium 2.6), so patient was given prescription for oral potassium and instructed to follow-up with PCP.    She has occasional epistaxis associated with seasonal allergies.  She reports that her stool has been dark ever since she started iron pills, but she denies any hematemesis, hematochezia, melena, or hematuria.  She admits to easy bruising but denies petechial rash.     She continues to have chronic baseline fatigue.  She has occasional chest pain (followed by cardiology).  She denies any recent headaches, palpitations, pica, lightheadedness, syncope or dyspnea on exertion.  No recent or recurrent infections.   She has not noticed any new lumps or bumps.   She denies any B symptoms such as fever, chills, or night sweats.  She continues to report poor appetite and gradual weight loss (has lost 3 pounds in the past 3 months).  She does not like the taste of Boost or Ensure.  She has little to no energy and 100% appetite. She endorses that she is maintaining a stable weight.   ASSESSMENT & PLAN:  1.  Thrombocytopenia (cirrhosis/splenomegaly), with history of drug-induced pancytopenia (resolved) - Seen at the request of rheumatologist (Dr. Zenovia Jordan) for evaluation of pancytopenia. - Per medical records sent by Dr. Nickola Major,  patient was noted to have onset of pancytopenia in November 2022. - She has been on methotrexate, Plaquenil, and Enbrel for psoriatic arthritis.   - Due to pancytopenia, methotrexate and Plaquenil were held by rheumatologist as of 02/18/2021.  She has been off of Enbrel for the past few months while waiting for financial assistance.  No improvement in her platelets while Enbrel has been held. - Hemoglobin and white blood cells returned to normal within 6 months of the discontinuation of methotrexate.  Platelets remained low. - Workup revealed Vitamin B12 deficiency, borderline iron deficiency, and hemoccult positive stool.  Otherwise, unremarkable with normal flow cytometry and SPEP.  Negative hepatitis panel. - RUQ abdomen US (08/09/2021): Findings suspicious for hepatic cirrhosis but with normal hepatic elastography - Splenic ultrasound (09/16/2021): Splenomegaly with maximum dimension 15.3 cm -Most recent CBC (07/08/2022): Platelets 91, at baseline.  Hgb 12.4, WBC 5.2, normal differential.  Normal B12 and folate - No B symptoms or frequent infections.      - No lymphadenopathy or splenomegaly palpated on exam.      - Mild pancytopenia has resolved after methotrexate was discontinued, but she has persistent moderate thrombocytopenia which is likely secondary to liver disease/splenomegaly and possible autoimmune thrombocytopenia versus medication effect. - PLAN: She has normal WBC and hemoglobin now, but persistent thrombocytopenia despite B12 and iron repletion.  Differential diagnosis and work-up for persistent thrombocytopenia includes: Thrombocytopenia related to liver disease and splenomegaly Autoimmune thrombocytopenia: We will consider trial of steroids if platelets <50 MDS or other bone marrow infiltrative process: No indication for bone marrow biopsy since other cell lines  have normalized.  Would consider bone marrow biopsy if she had other cytopenias or development of B symptoms.  Would consider  CT CAP in the presence of B symptoms due to increased risk of lymphoproliferative disorders in patients with autoimmune disease. -Repeat labs and RTC in 6  months.   2.  Mild anemia with marginal iron deficiency, RESOLVED - History of GI bleed in 2012 due to stomach ulcer, required blood transfusions and iron infusions. -History of IV iron infusions, last given in June 2015 - Hemoccult stool POSITIVE x3 in February 2023 - EGD (09/18/2021): Grade 1 esophageal varices, portal hypertensive gastropathy - Colonoscopy (09/18/2021): Congested mucosa in ascending colon and rectum corresponding with portal colopathy - Taking ferrous sulfate 325 mg 3 times weekly - Most recent labs (07/08/2022): Hgb 12.4/MCV 102.5.  Ferritin 132, iron saturation 62%, low TIBC 228 - Reports daily nosebleeds described as "large clots coming out of her nose when she sneezes".  She has been seen by ENT - No bright red blood per rectum, melena, or hematuria     - PLAN: Hemoglobin and iron stores currently normalized.  Continue ferrous sulfate 325 mg 3 times weekly - Continue follow-up with GI and ENT - Repeat labs and RTC in 6 months     3.  Vitamin B12 deficiency - Labs from 05/14/2021 show normal vitamin B12 at 295, but elevated methylmalonic acid 938 - Vitamin B12 injection given on 06/04/2021 - Has been taking vitamin B12 1000 mcg daily since March 2023 - Most recent labs (07/08/2022): Vitamin B12 1282 with folate 7.3.  Homocystine and MMA normal. - PLAN: Continue vitamin B12 1000 mcg daily.     - Recheck vitamin B12 and folate annually (next due in April 2025)   4.  Fatigue - Chronic multifactorial fatigue related to fibromyalgia, autoimmune disease (psoriatic arthritis), physical deconditioning, and heart disease - She may also have some aspect of fatigue from her vitamin B12 and iron deficiency - she has had slightly improved energy after B12 and iron repletion  5.  Hypokalemia - Labs last week showed severe hypokalemia  (potassium 2.6), so patient was given prescription for oral potassium and instructed to follow-up with PCP.   - PLAN: Repeat potassium today is pending.  Will defer to PCP for ongoing management.   6.  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  No tobacco, alcohol, or illicit drug use. - FAMILY: Mother had strokes.  Father had heart problems. Brother had prostate cancer.  PLAN SUMMARY: >> Labs in 6 months = CBC/D, CMP, ferritin, iron/TIBC >> OFFICE visit in 6 months (after labs)     REVIEW OF SYSTEMS:   Review of Systems  Constitutional:  Positive for fatigue. Negative for appetite change, chills, diaphoresis, fever and unexpected weight change.  HENT:   Negative for lump/mass and nosebleeds.   Eyes:  Negative for eye problems.  Respiratory:  Negative for cough, hemoptysis and shortness of breath.   Cardiovascular:  Negative for chest pain, leg swelling and palpitations.  Gastrointestinal:  Negative for abdominal pain, blood in stool, constipation, diarrhea, nausea and vomiting.  Genitourinary:  Negative for hematuria.   Skin:  Positive for rash.  Neurological:  Negative for dizziness, headaches and light-headedness.  Hematological:  Does not bruise/bleed easily.     PHYSICAL EXAM:  ECOG PERFORMANCE STATUS: 2 - Symptomatic, <50% confined to bed  There were no vitals filed for this visit. There were no vitals filed for this visit. Physical Exam Constitutional:      Appearance: Normal appearance. She is obese.  HENT:     Head: Normocephalic and atraumatic.     Mouth/Throat:     Mouth: Mucous membranes are moist.  Eyes:     Extraocular Movements: Extraocular movements intact.     Pupils: Pupils are equal, round, and reactive to light.  Cardiovascular:     Rate and Rhythm: Regular  rhythm. Bradycardia present.     Pulses: Normal pulses.     Heart sounds: Murmur heard.  Pulmonary:     Effort: Pulmonary effort is normal.     Breath sounds: Normal breath sounds.  Abdominal:     General: Bowel sounds are normal.     Palpations: Abdomen is soft.     Tenderness: There is no abdominal tenderness.  Musculoskeletal:        General: No swelling.     Right lower leg: No edema.     Left lower leg: No edema.  Lymphadenopathy:     Cervical: No cervical adenopathy.  Skin:    General: Skin is warm and dry.     Findings: Rash (Psoriatic rash) present.  Neurological:     General: No focal deficit present.     Mental Status: She is alert and oriented to person, place, and time.  Psychiatric:        Mood and Affect: Mood normal.        Behavior: Behavior normal.     PAST MEDICAL/SURGICAL HISTORY:  Past Medical History:  Diagnosis Date   Anemia    Anxiety    Arthritis    Cataract    Chronic back pain    Coronary artery disease    Multivessel disease, status post DES to mid LAD 05/2020   Essential hypertension    Fibromyalgia    Hiatal hernia    History of blood transfusion    Hypercholesteremia    Hypothyroidism    Osteoporosis    Type 2 diabetes mellitus (HCC)    Past Surgical History:  Procedure Laterality Date   ABDOMINAL HYSTERECTOMY     BACK SURGERY     C-EYE SURGERY PROCEDURE     CARPAL TUNNEL RELEASE     CATARACT EXTRACTION     CHOLECYSTECTOMY     COLONOSCOPY  08/06/06 [facility]DS   COLONOSCOPY WITH PROPOFOL N/A 09/18/2021   Procedure: COLONOSCOPY WITH PROPOFOL;  Surgeon: Dolores Frame, MD;  [facility];  Service: Gastroenterology;  Laterality: N/A;  945   CORONARY IMAGING/OCT N/A 05/24/2020   Procedure: INTRAVASCULAR IMAGING/OCT;  Surgeon: Kathleene Hazel, MD;  [facility];  Service: Cardiovascular;  Laterality: N/A;   CORONARY PRESSURE/FFR STUDY N/A 05/24/2020   Procedure: INTRAVASCULAR PRESSURE WIRE/FFR  STUDY;  Surgeon: Kathleene Hazel, MD;  [facility]: [facility];  Service: Cardiovascular;  Laterality: N/A;   CORONARY STENT INTERVENTION  05/24/2020   CORONARY STENT INTERVENTION N/A 05/24/2020   Procedure: CORONARY STENT INTERVENTION;  Surgeon: Kathleene Hazel, MD;  [facility];  Service: Cardiovascular;  Laterality: N/A;   DENTAL SURGERY  04/2015   reconstructive surgery lower   ESOPHAGOGASTRODUODENOSCOPY  10/31/2010   Procedure: ESOPHAGOGASTRODUODENOSCOPY (EGD);  Surgeon: Malissa Hippo, MD;  [facility]: [facility];  Service: Endoscopy;  Laterality: N/A;   ESOPHAGOGASTRODUODENOSCOPY  03/05/2011   Procedure: ESOPHAGOGASTRODUODENOSCOPY (EGD);  Surgeon: Malissa Hippo, MD;  [facility];  Service: Endoscopy;  Laterality: N/A;  12:00   ESOPHAGOGASTRODUODENOSCOPY  04/28/2012   Procedure: ESOPHAGOGASTRODUODENOSCOPY (EGD);  Surgeon: Malissa Hippo, MD;  [facility];  Service: Endoscopy;  Laterality: N/A;  140-moved to 10:30 Ann notified pt   ESOPHAGOGASTRODUODENOSCOPY (EGD) WITH PROPOFOL N/A 09/18/2021   Procedure: ESOPHAGOGASTRODUODENOSCOPY (EGD) WITH PROPOFOL;  Surgeon: Dolores Frame, MD;  [facility];  Service: Gastroenterology;  Laterality: N/A;   EYE SURGERY     LEFT HEART CATH AND CORONARY ANGIOGRAPHY N/A 05/24/2020   Procedure: LEFT HEART CATH AND CORONARY ANGIOGRAPHY;  Surgeon: Kathleene Hazel, MD;  [facility];  Service: Cardiovascular;  Laterality: N/A;   TONSILLECTOMY     TOTAL HIP REVISION     UPPER GASTROINTESTINAL ENDOSCOPY  10/03/2010   EGD DILATN PYLORIC CHANNEL   UPPER GASTROINTESTINAL ENDOSCOPY  07/08/2010   UPPER GASTROINTESTINAL ENDOSCOPY  08/06/06   FIELDS   UPPER GASTROINTESTINAL ENDOSCOPY  06/29/2008   EGD    SOCIAL HISTORY:  Social History   Socioeconomic History   Marital status: Married    Spouse name: Not on file   Number of children: Not on file    Years of education: Not on file   Highest education level: Not on file  Occupational History   Occupation: retired  Tobacco Use   Smoking status: Never   Smokeless tobacco: Never  Vaping Use   Vaping Use: Never used  Substance and Sexual Activity   Alcohol use: No    Alcohol/week: 0.0 standard drinks of alcohol   Drug use: No   Sexual activity: Yes    Birth control/protection: Post-menopausal  Other Topics Concern   Not on file  Social History Narrative   Not on file   Social Determinants of Health   Financial Resource Strain: Not on file  Food Insecurity: Not on file  Transportation Needs: Not on file  Physical Activity: Not on file  Stress: Not on file  Social Connections: Not on file  Intimate Partner Violence: Not on file    FAMILY HISTORY:  Family History  Problem Relation Age of Onset   Diabetes Mother    Stroke Mother    Hyperlipidemia Mother    Hypertension Mother    Stroke Father    Heart disease Father    Hyperlipidemia Father    Hypertension Father    Healthy Son     CURRENT MEDICATIONS:  Outpatient Encounter Medications as of 07/15/2022  Medication Sig   ACCU-CHEK AVIVA PLUS test strip    acetaminophen (TYLENOL) 500 MG tablet Take 500-1,000 mg by mouth every 6 (six) hours as needed (pain).   atorvastatin (LIPITOR) 40 MG tablet Take 1 tablet (40 mg total) by mouth daily. (Patient taking differently: Take 40 mg by mouth every evening.)   calcium carbonate (TUMS - DOSED IN MG ELEMENTAL CALCIUM) 500 MG chewable tablet Chew 1-2 tablets by mouth 3 (three) times daily as needed for indigestion or heartburn.   cholecalciferol (VITAMIN D3) 25 MCG (1000 UNIT) tablet Take 1,000 Units by mouth in the morning.   clopidogrel (PLAVIX) 75 MG tablet Take 1 tablet (75 mg total) by mouth daily with breakfast.   dicyclomine (BENTYL) 10 MG capsule Take 1 capsule (10 mg total) by mouth every 12 (twelve) hours as needed (abdominal pain).   diltiazem (CARDIZEM CD) 120 MG 24  hr capsule Take 1 capsule (120 mg total) by mouth daily. (Patient taking differently: Take 120 mg by mouth  every evening.)   empagliflozin (JARDIANCE) 25 MG TABS tablet Take 25 mg by mouth in the morning.   escitalopram (LEXAPRO) 10 MG tablet Take 10 mg by mouth daily as needed (anxious).   etanercept (ENBREL) 50 MG/ML injection Inject 50 mg into the skin every Monday.   FEROSUL 325 (65 Fe) MG tablet Take 325 mg by mouth 3 (three) times a week.   isosorbide mononitrate (IMDUR) 30 MG 24 hr tablet Take 1 tablet (30 mg total) by mouth daily.   JARDIANCE 25 MG TABS tablet Take 25 mg by mouth daily.   lactulose (CHRONULAC) 10 GM/15ML solution Take 30 mLs (20 g total) by mouth daily.   levothyroxine (SYNTHROID, LEVOTHROID) 50 MCG tablet Take 50 mcg by mouth daily before breakfast.   metoprolol tartrate (LOPRESSOR) 50 MG tablet TAKE (1) TABLET BY MOUTH TWICE DAILY.   nitroGLYCERIN (NITROSTAT) 0.4 MG SL tablet PLACE 1 TAB UNDER TONGUE EVERY 5 MIN IF NEEDED FOR CHEST PAIN. MAY USE 3 TIMES.NO RELIEF CALL 911.   pantoprazole (PROTONIX) 40 MG tablet Take 40 mg by mouth in the morning.   Polyethyl Glycol-Propyl Glycol (SYSTANE) 0.4-0.3 % SOLN Apply 1-2 drops to eye 4 (four) times daily as needed (dry/irritated eyes.).   potassium chloride SA (KLOR-CON M) 20 MEQ tablet Take 2 tablets (40 mEq total) by mouth 2 (two) times daily for 3 days, THEN 1 tablet (20 mEq total) daily for 27 days.   temazepam (RESTORIL) 30 MG capsule Take 30 mg by mouth at bedtime.   Vitamin A 2400 MCG (8000 UT) CAPS Take 2,400 mcg by mouth in the morning.   vitamin B-12 (CYANOCOBALAMIN) 1000 MCG tablet Take 1,000 mcg by mouth 3 (three) times a week.   No facility-administered encounter medications on file as of 07/15/2022.    ALLERGIES:  Allergies  Allergen Reactions   Shellfish Allergy Itching and Nausea And Vomiting   Tussigon [Hydrocodone Bit-Homatrop Mbr]     Per patient's husband , this medication caused confusion.    Hydrocodone Itching   Glyburide     Sudden Drop in Blood Sugars   Levofloxacin Itching   Zestril [Lisinopril] Other (See Comments)    SEVERE DROP IN PRESSURE   Morphine Itching and Nausea Only    LABORATORY DATA:  I have reviewed the labs as listed.  CBC    Component Value Date/Time   WBC 5.2 07/08/2022 1303   RBC 3.57 (L) 07/08/2022 1303   HGB 12.4 07/08/2022 1303   HCT 36.6 07/08/2022 1303   PLT 91 (L) 07/08/2022 1303   MCV 102.5 (H) 07/08/2022 1303   MCH 34.7 (H) 07/08/2022 1303   MCHC 33.9 07/08/2022 1303   RDW 15.0 07/08/2022 1303   LYMPHSABS 1.0 07/08/2022 1303   MONOABS 0.5 07/08/2022 1303   EOSABS 0.3 07/08/2022 1303   BASOSABS 0.0 07/08/2022 1303      Latest Ref Rng & Units 07/08/2022    1:03 PM 01/07/2022   12:20 PM 05/14/2021   10:39 AM  CMP  Glucose 70 - 99 mg/dL 169  450  388   BUN 8 - 23 mg/dL 6  8  10    Creatinine 0.44 - 1.00 mg/dL 8.28  0.03  4.91   Sodium 135 - 145 mmol/L 139  140  137   Potassium 3.5 - 5.1 mmol/L 2.6  2.9  3.8   Chloride 98 - 111 mmol/L 112  110  107   CO2 22 - 32 mmol/L 20  22  19  Calcium 8.9 - 10.3 mg/dL 8.1  8.5  8.5   Total Protein 6.5 - 8.1 g/dL 5.7  6.3  6.0   Total Bilirubin 0.3 - 1.2 mg/dL 2.7  1.9  1.1   Alkaline Phos 38 - 126 U/L 71  99  89   AST 15 - 41 U/L 44  56  41   ALT 0 - 44 U/L 23  33  20     DIAGNOSTIC IMAGING:  I have independently reviewed the relevant imaging and discussed with the patient.   WRAP UP:  All questions were answered. The patient knows to call the clinic with any problems, questions or concerns.  Medical decision making: Moderate  Time spent on visit: I spent 20 minutes counseling the patient face to face. The total time spent in the appointment was 30 minutes and more than 50% was on counseling.  Carnella Guadalajara, PA-C  07/15/2022 2:54 PM

## 2022-07-15 ENCOUNTER — Inpatient Hospital Stay (HOSPITAL_BASED_OUTPATIENT_CLINIC_OR_DEPARTMENT_OTHER): Payer: PPO | Admitting: Physician Assistant

## 2022-07-15 ENCOUNTER — Inpatient Hospital Stay: Payer: PPO

## 2022-07-15 ENCOUNTER — Encounter: Payer: Self-pay | Admitting: Physician Assistant

## 2022-07-15 ENCOUNTER — Other Ambulatory Visit: Payer: Self-pay

## 2022-07-15 VITALS — BP 126/48 | HR 51 | Temp 99.2°F | Resp 18 | Wt 136.1 lb

## 2022-07-15 DIAGNOSIS — E876 Hypokalemia: Secondary | ICD-10-CM

## 2022-07-15 DIAGNOSIS — D696 Thrombocytopenia, unspecified: Secondary | ICD-10-CM | POA: Diagnosis not present

## 2022-07-15 DIAGNOSIS — D649 Anemia, unspecified: Secondary | ICD-10-CM | POA: Diagnosis not present

## 2022-07-15 DIAGNOSIS — D61818 Other pancytopenia: Secondary | ICD-10-CM | POA: Diagnosis not present

## 2022-07-15 DIAGNOSIS — E611 Iron deficiency: Secondary | ICD-10-CM | POA: Diagnosis not present

## 2022-07-15 DIAGNOSIS — E538 Deficiency of other specified B group vitamins: Secondary | ICD-10-CM | POA: Diagnosis not present

## 2022-07-15 LAB — BASIC METABOLIC PANEL
Anion gap: 5 (ref 5–15)
BUN: 5 mg/dL — ABNORMAL LOW (ref 8–23)
CO2: 21 mmol/L — ABNORMAL LOW (ref 22–32)
Calcium: 8 mg/dL — ABNORMAL LOW (ref 8.9–10.3)
Chloride: 110 mmol/L (ref 98–111)
Creatinine, Ser: 0.78 mg/dL (ref 0.44–1.00)
GFR, Estimated: >60 mL/min (ref >60)
Glucose, Bld: 104 mg/dL — ABNORMAL HIGH (ref 70–99)
Potassium: 3.6 mmol/L (ref 3.5–5.1)
Sodium: 136 mmol/L (ref 135–145)

## 2022-07-15 NOTE — Patient Instructions (Addendum)
[facility] at [facility] **VISIT SUMMARY & IMPORTANT INSTRUCTIONS **   You were seen today by Rojelio Brenner PA-C for your follow-up visit.    LOW PLATELETS Your platelets today are low but stable at 91. Your low platelets are most likely related to your underlying liver disease and enlarged spleen.  They may also be related to your autoimmune disease.  IRON DEFICIENCY ANEMIA Your blood levels look great today! Your iron levels have not come back from the lab yet.  Continue to take your iron pill 3 days a week.  We will call and let you know if you need any IV iron.  LOW POTASSIUM  Your potassium was very low with your labs last week We will recheck your potassium today. Continue taking potassium 20 mEq daily. Please call your PRIMARY CARE DOCTOR (Dr. Catalina Pizza) so that he can monitor your potassium and adjust your potassium medication going forward.   LABS: Return in 6 months for repeat labs  MEDICATIONS: - Iron tablet 3 days a week - Vitamin B12 tablet daily  FOLLOW-UP APPOINTMENT: Office visit in 6 months, 1 week after labs  ** Thank you for trusting me with your healthcare!  I strive to provide all of my patients with quality care at each visit.  If you receive a survey for this visit, I would be so grateful to you for taking the time to provide feedback.  Thank you in advance!  ~ Eloisa Chokshi                   Dr. Doreatha Massed   &   Rojelio Brenner, PA-C   - - - - - - - - - - - - - - - - - -    Thank you for choosing [facility] at [facility] to provide your oncology and hematology care.  To afford each patient quality time with our provider, please arrive at least 15 minutes before your scheduled appointment time.   If you have a lab appointment with the Cancer Center please come in thru the Main Entrance and check in at the main information desk.  You need to re-schedule your appointment should you arrive 10 or more  minutes late.  We strive to give you quality time with our providers, and arriving late affects you and other patients whose appointments are after yours.  Also, if you no show three or more times for appointments you may be dismissed from the clinic at the providers discretion.     Again, thank you for choosing [facility].  Our hope is that these requests will decrease the amount of time that you wait before being seen by our physicians.       _____________________________________________________________  Should you have questions after your visit to [facility], please contact our office at [phone] and follow the prompts.  Our office hours are 8:00 a.m. and 4:30 p.m. Monday - Friday.  Please note that voicemails left after 4:00 p.m. may not be returned until the following business day.  We are closed weekends and major holidays.  You do have access to a nurse 24-7, just call the main number to the clinic [phone] and do not press any options, hold on the line and a nurse will answer the phone.    For prescription refill requests, have your pharmacy contact our office and allow 72 hours.

## 2022-07-21 DIAGNOSIS — E782 Mixed hyperlipidemia: Secondary | ICD-10-CM | POA: Diagnosis not present

## 2022-07-21 DIAGNOSIS — E1140 Type 2 diabetes mellitus with diabetic neuropathy, unspecified: Secondary | ICD-10-CM | POA: Diagnosis not present

## 2022-07-21 DIAGNOSIS — E039 Hypothyroidism, unspecified: Secondary | ICD-10-CM | POA: Diagnosis not present

## 2022-07-24 DIAGNOSIS — Z0000 Encounter for general adult medical examination without abnormal findings: Secondary | ICD-10-CM | POA: Diagnosis not present

## 2022-07-25 ENCOUNTER — Encounter: Payer: Self-pay | Admitting: Internal Medicine

## 2022-07-25 DIAGNOSIS — E1142 Type 2 diabetes mellitus with diabetic polyneuropathy: Secondary | ICD-10-CM | POA: Diagnosis not present

## 2022-07-25 DIAGNOSIS — E559 Vitamin D deficiency, unspecified: Secondary | ICD-10-CM | POA: Diagnosis not present

## 2022-07-25 DIAGNOSIS — G4700 Insomnia, unspecified: Secondary | ICD-10-CM | POA: Diagnosis not present

## 2022-07-25 DIAGNOSIS — I358 Other nonrheumatic aortic valve disorders: Secondary | ICD-10-CM | POA: Diagnosis not present

## 2022-07-25 DIAGNOSIS — E782 Mixed hyperlipidemia: Secondary | ICD-10-CM | POA: Diagnosis not present

## 2022-07-25 DIAGNOSIS — I2510 Atherosclerotic heart disease of native coronary artery without angina pectoris: Secondary | ICD-10-CM | POA: Diagnosis not present

## 2022-07-25 DIAGNOSIS — L4050 Arthropathic psoriasis, unspecified: Secondary | ICD-10-CM | POA: Diagnosis not present

## 2022-07-25 DIAGNOSIS — M810 Age-related osteoporosis without current pathological fracture: Secondary | ICD-10-CM | POA: Diagnosis not present

## 2022-07-25 DIAGNOSIS — E039 Hypothyroidism, unspecified: Secondary | ICD-10-CM | POA: Diagnosis not present

## 2022-07-25 DIAGNOSIS — I10 Essential (primary) hypertension: Secondary | ICD-10-CM | POA: Diagnosis not present

## 2022-07-25 DIAGNOSIS — D509 Iron deficiency anemia, unspecified: Secondary | ICD-10-CM | POA: Diagnosis not present

## 2022-07-25 DIAGNOSIS — K219 Gastro-esophageal reflux disease without esophagitis: Secondary | ICD-10-CM | POA: Diagnosis not present

## 2022-07-28 ENCOUNTER — Telehealth (INDEPENDENT_AMBULATORY_CARE_PROVIDER_SITE_OTHER): Payer: Self-pay | Admitting: Gastroenterology

## 2022-07-28 NOTE — Telephone Encounter (Signed)
Received the results of the most recent blood testing from 07/21/2022 which showed hemoglobin A1c of 5.1, CMP with total bilirubin 1.7, AST 43, ALT 20, alkaline phosphatase 96, albumin 3.3, creatinine 0.77, BUN 5, sodium 142, potassium 4.1, CBC with WBC 3.6, hemoglobin 11.7, platelets 72,000, normal TSH 0.84.  Crystal, Please let the patient know that I nervous if the blood testing I ordered back on 02/17/2022.  Labs sent today look stable but we we will still need to have other testing performed.  Please send her an order for hepatitis a total antibody, hepatitis B surface antibody, acute hepatitis panel, mitochondrial antibodies, anti-smooth muscle antibody, ANA, IgG, CMP and INR.  Thanks

## 2022-07-29 ENCOUNTER — Encounter (INDEPENDENT_AMBULATORY_CARE_PROVIDER_SITE_OTHER): Payer: Self-pay

## 2022-07-29 ENCOUNTER — Other Ambulatory Visit (INDEPENDENT_AMBULATORY_CARE_PROVIDER_SITE_OTHER): Payer: Self-pay

## 2022-07-29 DIAGNOSIS — I8510 Secondary esophageal varices without bleeding: Secondary | ICD-10-CM

## 2022-07-29 DIAGNOSIS — K7460 Unspecified cirrhosis of liver: Secondary | ICD-10-CM

## 2022-07-29 DIAGNOSIS — E118 Type 2 diabetes mellitus with unspecified complications: Secondary | ICD-10-CM

## 2022-07-29 DIAGNOSIS — I10 Essential (primary) hypertension: Secondary | ICD-10-CM

## 2022-07-29 DIAGNOSIS — Z1159 Encounter for screening for other viral diseases: Secondary | ICD-10-CM

## 2022-07-29 DIAGNOSIS — R7989 Other specified abnormal findings of blood chemistry: Secondary | ICD-10-CM

## 2022-07-29 DIAGNOSIS — E782 Mixed hyperlipidemia: Secondary | ICD-10-CM

## 2022-07-29 DIAGNOSIS — D509 Iron deficiency anemia, unspecified: Secondary | ICD-10-CM

## 2022-07-29 DIAGNOSIS — E038 Other specified hypothyroidism: Secondary | ICD-10-CM

## 2022-07-29 DIAGNOSIS — R9439 Abnormal result of other cardiovascular function study: Secondary | ICD-10-CM

## 2022-07-29 DIAGNOSIS — K7682 Hepatic encephalopathy: Secondary | ICD-10-CM

## 2022-07-29 NOTE — Telephone Encounter (Signed)
Sent my chart message to the patient regarding the need to have additional labs done at [facility] and mailed her the lab orders. Asked that if she had questions to call us.

## 2022-07-29 NOTE — Telephone Encounter (Signed)
Tried calling mail box full, unable to reach patient.

## 2022-08-05 ENCOUNTER — Other Ambulatory Visit: Payer: Self-pay | Admitting: Cardiology

## 2022-08-18 ENCOUNTER — Encounter (INDEPENDENT_AMBULATORY_CARE_PROVIDER_SITE_OTHER): Payer: Self-pay | Admitting: Gastroenterology

## 2022-08-18 ENCOUNTER — Ambulatory Visit (INDEPENDENT_AMBULATORY_CARE_PROVIDER_SITE_OTHER): Payer: PPO | Admitting: Gastroenterology

## 2022-08-18 VITALS — BP 126/56 | HR 52 | Temp 98.0°F | Ht 64.0 in | Wt 134.5 lb

## 2022-08-18 DIAGNOSIS — I8510 Secondary esophageal varices without bleeding: Secondary | ICD-10-CM | POA: Diagnosis not present

## 2022-08-18 DIAGNOSIS — K7460 Unspecified cirrhosis of liver: Secondary | ICD-10-CM | POA: Diagnosis not present

## 2022-08-18 NOTE — Progress Notes (Unsigned)
Katrinka Blazing, M.D. Gastroenterology & Hepatology [facility] Gastroenterology [address] [place], [place] [address]  Primary Care Physician: [name], MD [address] [place] [address]  I will communicate my assessment and recommendations to the referring MD via EMR.  Problems: Compensated cirrhosis, possibly due to NASH G1 EV  Iron deficiency anemia  History of Present Illness: Rebecca Hartman is a 77 y.o. female with past medical history of coronary artery disease status post stent placement on Plavix, cirrhosis possibly due to NASH c/b non bleeding EV, psoriatic arthritis,, hypertension, fibromyalgia, hyperlipidemia, hypothyroidism, type 2 diabetes, osteoporosis, who presents for follow up of IDA  and liver cirrhosis.   The patient was last seen on 02/17/2022. At that time, the patient had blood workup ordered but never performed this.  She was started on lactulose for episode of forgetfulness.  Notably, I received the blood testing performed on 07/21/2022 which showed hemoglobin A1c of 5.1, CMP with total bilirubin 1.7, AST 43, ALT 20, alkaline phosphatase 96, albumin 3.3, creatinine 0.77, BUN 5, sodium 142, potassium 4.1, CBC with WBC 3.6, hemoglobin 11.7, platelets 72,000, normal TSH 0.84.  However, hepatitis a total antibody, hepatitis B surface antibody, acute hepatitis panel, mitochondrial antibodies, anti-smooth muscle antibody, ANA, IgG, CMP and INR.  Were not performed and I requested having this testing performed again, however she never on this testing.  Patient reports feeling somewhat better, as she has not had any more abdominal pain or discomfort in her RUQ. The patient denies having any nausea, vomiting, fever, chills, hematochezia, melena, hematemesis, abdominal distention, abdominal pain, diarrhea, jaundice, pruritus . Has lost 8 lb since last visit as her appetite has worsened.  Cirrhosis related  questions: Hematemesis/coffee ground emesis: No History of variceal bleeding: No Abdominal pain: No Abdominal distention/worsening ascitesNo Fever/chills: No Episodes of confusion/disorientation: No disorientation but she has been forgetful  recently Number of daily bowel movements: has 1 Bm every day, takes lactulose as needed Taking diuretics?: No Prior history of banding?: No Prior episodes of SBP: No Last time liver imaging was performed: elastrography 08/09/2021 which showed nodular liver with a low kPa o 4.1 and IQR ratio 0.15.  Last MELD score: unavailable Last AFP: unavailable   Last EGD: 09/18/2021 - Grade I esophageal varices. - 3 cm hiatal hernia. - Gastroesophageal flap valve classified as Hill Grade IV (no fold, wide open lumen, hiatal hernia present). - Portal hypertensive gastropathy. - Normal examined duodenum. - No specimens collected.   Recommended repeat in 2 years   Last Colonoscopy: 09/18/2021 - Congested mucosa in the ascending colon and in the cecum. Portal colopathy. - The examination was otherwise normal. - Non-bleeding internal hemorrhoids.  Past Medical History: Past Medical History:  Diagnosis Date   Anemia    Anxiety    Arthritis    Cataract    Chronic back pain    Coronary artery disease    Multivessel disease, status post DES to mid LAD 05/2020   Essential hypertension    Fibromyalgia    Hiatal hernia    History of blood transfusion    Hypercholesteremia    Hypothyroidism    Osteoporosis    Type 2 diabetes mellitus (HCC)     Past Surgical History: Past Surgical History:  Procedure Laterality Date   ABDOMINAL HYSTERECTOMY     BACK SURGERY     C-EYE SURGERY PROCEDURE     CARPAL TUNNEL RELEASE     CATARACT EXTRACTION  CHOLECYSTECTOMY     COLONOSCOPY  08/06/06 [facility]   COLONOSCOPY WITH PROPOFOL N/A 09/18/2021   Procedure: COLONOSCOPY WITH PROPOFOL;  Surgeon: Dolores Frame, MD;  [facility]: [facility];  Service:  Gastroenterology;  Laterality: N/A;  945   CORONARY IMAGING/OCT N/A 05/24/2020   Procedure: INTRAVASCULAR IMAGING/OCT;  Surgeon: Kathleene Hazel, MD;  Location: [facility];  Service: Cardiovascular;  Laterality: N/A;   CORONARY PRESSURE/FFR STUDY N/A 05/24/2020   Procedure: INTRAVASCULAR PRESSURE WIRE/FFR STUDY;  Surgeon: Kathleene Hazel, MD;  Location: [facility];  Service: Cardiovascular;  Laterality: N/A;   CORONARY STENT INTERVENTION  05/24/2020   CORONARY STENT INTERVENTION N/A 05/24/2020   Procedure: CORONARY STENT INTERVENTION;  Surgeon: Kathleene Hazel, MD;  [facility]: [facility];  Service: Cardiovascular;  Laterality: N/A;   DENTAL SURGERY  04/2015   reconstructive surgery lower   ESOPHAGOGASTRODUODENOSCOPY  10/31/2010   Procedure: ESOPHAGOGASTRODUODENOSCOPY (EGD);  Surgeon: Malissa Hippo, MD;  [facility]: [facility];  Service: Endoscopy;  Laterality: N/A;   ESOPHAGOGASTRODUODENOSCOPY  03/05/2011   Procedure: ESOPHAGOGASTRODUODENOSCOPY (EGD);  Surgeon: Malissa Hippo, MD;  [facility];  Service: Endoscopy;  Laterality: N/A;  12:00   ESOPHAGOGASTRODUODENOSCOPY  04/28/2012   Procedure: ESOPHAGOGASTRODUODENOSCOPY (EGD);  Surgeon: Malissa Hippo, MD;  [facility]: [facility];  Service: Endoscopy;  Laterality: N/A;  140-moved to 10:30 Ann notified pt   ESOPHAGOGASTRODUODENOSCOPY (EGD) WITH PROPOFOL N/A 09/18/2021   Procedure: ESOPHAGOGASTRODUODENOSCOPY (EGD) WITH PROPOFOL;  Surgeon: Dolores Frame, MD;  [facility];  Service: Gastroenterology;  Laterality: N/A;   EYE SURGERY     LEFT HEART CATH AND CORONARY ANGIOGRAPHY N/A 05/24/2020   Procedure: LEFT HEART CATH AND CORONARY ANGIOGRAPHY;  Surgeon: Kathleene Hazel, MD;  [facility];  Service: Cardiovascular;  Laterality: N/A;   TONSILLECTOMY     TOTAL HIP REVISION     UPPER GASTROINTESTINAL ENDOSCOPY  10/03/2010   EGD DILATN PYLORIC  CHANNEL   UPPER GASTROINTESTINAL ENDOSCOPY  07/08/2010   UPPER GASTROINTESTINAL ENDOSCOPY  08/06/06   [facility]EL[facility]   UPPER GASTROINTESTINAL ENDOSCOPY  06/29/2008   EGD    Family History: Family History  Problem Relation Age of Onset   Diabetes Mother    Stroke Mother    Hyperlipidemia Mother    Hypertension Mother    Stroke Father    Heart disease Father    Hyperlipidemia Father    Hypertension Father    Healthy Son     Social History: Social History   Tobacco Use  Smoking Status Never  Smokeless Tobacco Never   Social History   Substance and Sexual Activity  Alcohol Use No   Alcohol/week: 0.0 standard drinks of alcohol   Social History   Substance and Sexual Activity  Drug Use No    Allergies: Allergies  Allergen Reactions   Shellfish Allergy Itching and Nausea And Vomiting   Tussigon [Hydrocodone Bit-Homatrop Mbr]     Per patient's husband , this medication caused confusion.   Hydrocodone Itching   Glyburide     Sudden Drop in Blood Sugars   Levofloxacin Itching   Zestril [Lisinopril] Other (See Comments)    SEVERE DROP IN PRESSURE   Morphine Itching and Nausea Only    Medications: Current Outpatient Medications  Medication Sig Dispense Refill   ACCU-CHEK AVIVA PLUS test strip as needed.     acetaminophen (TYLENOL) 500 MG tablet Take 500-1,000 mg by mouth every 6 (six) hours as  needed (pain).     aluminum-magnesium hydroxide 200-200 MG/5ML suspension Take by mouth every 6 (six) hours as needed for indigestion. 15-30 ml prn     ascorbic acid (VITAMIN C) 500 MG tablet Take 500 mg by mouth daily.     atorvastatin (LIPITOR) 40 MG tablet Take 40 mg by mouth daily.     calcium carbonate (TUMS - DOSED IN MG ELEMENTAL CALCIUM) 500 MG chewable tablet Chew 1-2 tablets by mouth 3 (three) times daily as needed for indigestion or heartburn.     cholecalciferol (VITAMIN D3) 25 MCG (1000 UNIT) tablet Take 1,000 Units by mouth in the morning.     clopidogrel (PLAVIX) 75  MG tablet Take 1 tablet (75 mg total) by mouth daily with breakfast. 90 tablet 3   dextromethorphan-guaiFENesin (MUCINEX DM) 30-600 MG 12hr tablet Take 1 tablet by mouth as needed for cough.     diltiazem (CARDIZEM CD) 120 MG 24 hr capsule Take 1 capsule (120 mg total) by mouth daily. (Patient taking differently: Take 120 mg by mouth every evening.) 90 capsule 3   empagliflozin (JARDIANCE) 25 MG TABS tablet Take 25 mg by mouth in the morning.     escitalopram (LEXAPRO) 10 MG tablet Take 10 mg by mouth as needed.     FEROSUL 325 (65 Fe) MG tablet Take 325 mg by mouth daily with breakfast. Tues, Thurs, and Sat.     fluconazole (DIFLUCAN) 100 MG tablet Take 100 mg by mouth as needed.     fluocinonide ointment (LIDEX) 0.05 % Apply 1 Application topically as needed.     isosorbide mononitrate (IMDUR) 30 MG 24 hr tablet TAKE 1 TABLET BY MOUTH ONCE DAILY. 90 tablet 3   JARDIANCE 25 MG TABS tablet Take 25 mg by mouth daily.     lactulose (CHRONULAC) 10 GM/15ML solution Take 30 mLs (20 g total) by mouth daily. 5400 mL 1   levothyroxine (SYNTHROID, LEVOTHROID) 50 MCG tablet Take 50 mcg by mouth daily before breakfast.     metoprolol tartrate (LOPRESSOR) 50 MG tablet TAKE (1) TABLET BY MOUTH TWICE DAILY. 180 tablet 0   mupirocin cream (BACTROBAN) 2 % Apply 1 Application topically as needed.     nitroGLYCERIN (NITROSTAT) 0.4 MG SL tablet PLACE 1 TAB UNDER TONGUE EVERY 5 MIN IF NEEDED FOR CHEST PAIN. MAY USE 3 TIMES.NO RELIEF CALL 911. 25 tablet 3   pantoprazole (PROTONIX) 40 MG tablet Take 40 mg by mouth in the morning.     Polyethyl Glycol-Propyl Glycol (SYSTANE) 0.4-0.3 % SOLN Apply 1-2 drops to eye 4 (four) times daily as needed (dry/irritated eyes.).     temazepam (RESTORIL) 30 MG capsule Take 30 mg by mouth at bedtime.     Vitamin A 2400 MCG (8000 UT) CAPS Take 2,400 mcg by mouth in the morning.     vitamin B-12 (CYANOCOBALAMIN) 1000 MCG tablet Take 1,000 mcg by mouth 3 (three) times a week.      etanercept (ENBREL) 50 MG/ML injection Inject 50 mg into the skin every Monday. (Patient not taking: Reported on 08/18/2022)     No current facility-administered medications for this visit.    Review of Systems: GENERAL: negative for malaise, night sweats HEENT: No changes in hearing or vision, no nose bleeds or other nasal problems. NECK: Negative for lumps, goiter, pain and significant neck swelling RESPIRATORY: Negative for cough, wheezing CARDIOVASCULAR: Negative for chest pain, leg swelling, palpitations, orthopnea GI: SEE HPI MUSCULOSKELETAL: Negative for joint pain or swelling, back pain, and  muscle pain. SKIN: Negative for lesions, rash PSYCH: Negative for sleep disturbance, mood disorder and recent psychosocial stressors. HEMATOLOGY Negative for prolonged bleeding, bruising easily, and swollen nodes. ENDOCRINE: Negative for cold or heat intolerance, polyuria, polydipsia and goiter. NEURO: negative for tremor, gait imbalance, syncope and seizures. The remainder of the review of systems is noncontributory.   Physical Exam: BP (!) 126/56 (BP Location: Left Arm, Patient Position: Sitting, Cuff Size: Normal)   Pulse (!) 52   Temp 98 F (36.7 C) (Temporal)   Ht 5\' 4"  (1.626 m)   Wt 134 lb 8 oz (61 kg)   BMI 23.09 kg/m  GENERAL: The patient is AO x3, in no acute distress. HEENT: Head is normocephalic and atraumatic. EOMI are intact. Mouth is well hydrated and without lesions. NECK: Supple. No masses LUNGS: Clear to auscultation. No presence of rhonchi/wheezing/rales. Adequate chest expansion HEART: RRR, normal s1 and s2. ABDOMEN: Soft, nontender, no guarding, no peritoneal signs, and nondistended. BS +. No masses. RECTAL EXAM: no external lesions, normal tone, no masses, brown stool without blood.*** Chaperone: EXTREMITIES: Without any cyanosis, clubbing, rash, lesions or edema. NEUROLOGIC: AOx3, no focal motor deficit. SKIN: no jaundice, no rashes  Imaging/Labs: as  above  I personally reviewed and interpreted the available labs, imaging and endoscopic files.  Impression and Plan: Rebecca Hartman is a 77 y.o. female coming for follow up of ***   All questions were answered.      Katrinka Blazing, MD Gastroenterology and Hepatology [facility]

## 2022-08-18 NOTE — Patient Instructions (Addendum)
-   Perform blood workup -Increase intake of lactulose to at least once a day to achieve 2-3 bowel movements per day.  If there is any improvement on forgetfulness, continue if not, can take it as needed. - Schedule liver US - Reduce salt intake to <2 g per day - Can take Tylenol max of 2 g per day (650 mg q8h) for pain - Avoid NSAIDs for pain - Avoid eating raw oysters/shellfish - Protein shake (Ensure or Boost) every night before going to sleep

## 2022-08-20 DIAGNOSIS — K7460 Unspecified cirrhosis of liver: Secondary | ICD-10-CM | POA: Diagnosis not present

## 2022-08-21 LAB — COMPREHENSIVE METABOLIC PANEL
ALT: 24 IU/L (ref 0–32)
AST: 44 IU/L — ABNORMAL HIGH (ref 0–40)
Albumin/Globulin Ratio: 1.4 (ref 1.2–2.2)
Bilirubin Total: 1.7 mg/dL — ABNORMAL HIGH (ref 0.0–1.2)
Chloride: 107 mmol/L — ABNORMAL HIGH (ref 96–106)

## 2022-08-21 LAB — HEPATITIS B SURFACE ANTIBODY,QUALITATIVE: Hep B Surface Ab, Qual: NONREACTIVE

## 2022-08-21 LAB — CBC WITH DIFFERENTIAL/PLATELET
EOS (ABSOLUTE): 0.2 10*3/uL (ref 0.0–0.4)
Eos: 5 %
Hemoglobin: 11.3 g/dL (ref 11.1–15.9)
Immature Granulocytes: 0 %
Lymphs: 23 %
MCH: 33.6 pg — ABNORMAL HIGH (ref 26.6–33.0)
MCHC: 34.3 g/dL (ref 31.5–35.7)

## 2022-08-21 LAB — ACUTE VIRAL HEPATITIS (HAV, HBV, HCV): Hep A IgM: NEGATIVE

## 2022-08-21 LAB — MITOCHONDRIAL ANTIBODIES

## 2022-08-21 LAB — ANA: Anti Nuclear Antibody (ANA): NEGATIVE

## 2022-08-22 LAB — COMPREHENSIVE METABOLIC PANEL
Albumin: 3.3 g/dL — ABNORMAL LOW (ref 3.8–4.8)
Alkaline Phosphatase: 89 IU/L (ref 44–121)
BUN/Creatinine Ratio: 7 — ABNORMAL LOW (ref 12–28)
BUN: 5 mg/dL — ABNORMAL LOW (ref 8–27)
CO2: 19 mmol/L — ABNORMAL LOW (ref 20–29)
Calcium: 8.3 mg/dL — ABNORMAL LOW (ref 8.7–10.3)
Creatinine, Ser: 0.74 mg/dL (ref 0.57–1.00)
Globulin, Total: 2.4 g/dL (ref 1.5–4.5)
Glucose: 158 mg/dL — ABNORMAL HIGH (ref 70–99)
Potassium: 3.4 mmol/L — ABNORMAL LOW (ref 3.5–5.2)
Sodium: 141 mmol/L (ref 134–144)
Total Protein: 5.7 g/dL — ABNORMAL LOW (ref 6.0–8.5)
eGFR: 84 mL/min/{1.73_m2} (ref >59)

## 2022-08-22 LAB — CBC WITH DIFFERENTIAL/PLATELET
Basophils Absolute: 0 10*3/uL (ref 0.0–0.2)
Basos: 1 %
Hematocrit: 32.9 % — ABNORMAL LOW (ref 34.0–46.6)
Immature Grans (Abs): 0 10*3/uL (ref 0.0–0.1)
Lymphocytes Absolute: 0.9 10*3/uL (ref 0.7–3.1)
MCV: 98 fL — ABNORMAL HIGH (ref 79–97)
Monocytes Absolute: 0.3 10*3/uL (ref 0.1–0.9)
Monocytes: 9 %
Neutrophils Absolute: 2.4 10*3/uL (ref 1.4–7.0)
Neutrophils: 62 %
Platelets: 81 10*3/uL — CL (ref 150–450)
RBC: 3.36 x10E6/uL — ABNORMAL LOW (ref 3.77–5.28)
RDW: 13.1 % (ref 11.7–15.4)
WBC: 3.8 10*3/uL (ref 3.4–10.8)

## 2022-08-22 LAB — ACUTE VIRAL HEPATITIS (HAV, HBV, HCV)
HCV Ab: NONREACTIVE
Hep B C IgM: NEGATIVE
Hepatitis B Surface Ag: NEGATIVE

## 2022-08-22 LAB — HEPATITIS A ANTIBODY, TOTAL: hep A Total Ab: POSITIVE — AB

## 2022-08-22 LAB — PROTIME-INR
INR: 1.4 — ABNORMAL HIGH (ref 0.9–1.2)
Prothrombin Time: 15.2 s — ABNORMAL HIGH (ref 9.1–12.0)

## 2022-08-22 LAB — AFP TUMOR MARKER: AFP, Serum, Tumor Marker: 4.3 ng/mL (ref 0.0–9.2)

## 2022-08-22 LAB — IGG: IgG (Immunoglobin G), Serum: 979 mg/dL (ref 586–1602)

## 2022-08-22 LAB — ANTI-SMOOTH MUSCLE ANTIBODY, IGG: Smooth Muscle Ab: 7 Units (ref 0–19)

## 2022-08-22 LAB — HCV INTERPRETATION

## 2022-08-28 ENCOUNTER — Other Ambulatory Visit (INDEPENDENT_AMBULATORY_CARE_PROVIDER_SITE_OTHER): Payer: Self-pay | Admitting: Gastroenterology

## 2022-09-08 ENCOUNTER — Ambulatory Visit (HOSPITAL_COMMUNITY)
Admission: RE | Admit: 2022-09-08 | Discharge: 2022-09-08 | Disposition: A | Discharge status: Home / Self Care | Payer: PPO | Source: Ambulatory Visit | Attending: Gastroenterology | Admitting: Gastroenterology

## 2022-09-08 DIAGNOSIS — K7460 Unspecified cirrhosis of liver: Secondary | ICD-10-CM | POA: Diagnosis not present

## 2022-09-08 DIAGNOSIS — R188 Other ascites: Secondary | ICD-10-CM | POA: Diagnosis not present

## 2022-09-09 ENCOUNTER — Encounter (INDEPENDENT_AMBULATORY_CARE_PROVIDER_SITE_OTHER): Payer: Self-pay

## 2022-09-16 DIAGNOSIS — Z8551 Personal history of malignant neoplasm of bladder: Secondary | ICD-10-CM | POA: Diagnosis not present

## 2022-09-18 DIAGNOSIS — M797 Fibromyalgia: Secondary | ICD-10-CM | POA: Diagnosis not present

## 2022-09-18 DIAGNOSIS — Z79899 Other long term (current) drug therapy: Secondary | ICD-10-CM | POA: Diagnosis not present

## 2022-09-18 DIAGNOSIS — L4050 Arthropathic psoriasis, unspecified: Secondary | ICD-10-CM | POA: Diagnosis not present

## 2022-09-18 DIAGNOSIS — M1991 Primary osteoarthritis, unspecified site: Secondary | ICD-10-CM | POA: Diagnosis not present

## 2022-09-18 DIAGNOSIS — D649 Anemia, unspecified: Secondary | ICD-10-CM | POA: Diagnosis not present

## 2022-09-18 DIAGNOSIS — L409 Psoriasis, unspecified: Secondary | ICD-10-CM | POA: Diagnosis not present

## 2022-09-18 DIAGNOSIS — Z6823 Body mass index (BMI) 23.0-23.9, adult: Secondary | ICD-10-CM | POA: Diagnosis not present

## 2022-10-03 DIAGNOSIS — E1165 Type 2 diabetes mellitus with hyperglycemia: Secondary | ICD-10-CM | POA: Diagnosis not present

## 2022-10-03 DIAGNOSIS — M6282 Rhabdomyolysis: Secondary | ICD-10-CM | POA: Diagnosis not present

## 2022-10-03 DIAGNOSIS — E782 Mixed hyperlipidemia: Secondary | ICD-10-CM | POA: Diagnosis not present

## 2022-10-03 DIAGNOSIS — D72829 Elevated white blood cell count, unspecified: Secondary | ICD-10-CM | POA: Diagnosis not present

## 2022-10-03 DIAGNOSIS — R7401 Elevation of levels of liver transaminase levels: Secondary | ICD-10-CM | POA: Diagnosis not present

## 2022-10-03 DIAGNOSIS — E669 Obesity, unspecified: Secondary | ICD-10-CM | POA: Diagnosis not present

## 2022-10-03 DIAGNOSIS — N179 Acute kidney failure, unspecified: Secondary | ICD-10-CM | POA: Diagnosis not present

## 2022-10-03 DIAGNOSIS — N400 Enlarged prostate without lower urinary tract symptoms: Secondary | ICD-10-CM | POA: Diagnosis not present

## 2022-10-03 DIAGNOSIS — I10 Essential (primary) hypertension: Secondary | ICD-10-CM | POA: Diagnosis not present

## 2022-10-04 DIAGNOSIS — M6282 Rhabdomyolysis: Secondary | ICD-10-CM | POA: Diagnosis not present

## 2022-10-05 DIAGNOSIS — M6282 Rhabdomyolysis: Secondary | ICD-10-CM | POA: Diagnosis not present

## 2022-10-10 DIAGNOSIS — Z1152 Encounter for screening for COVID-19: Secondary | ICD-10-CM | POA: Diagnosis not present

## 2022-10-10 DIAGNOSIS — Z79899 Other long term (current) drug therapy: Secondary | ICD-10-CM | POA: Diagnosis not present

## 2022-10-10 DIAGNOSIS — R531 Weakness: Secondary | ICD-10-CM | POA: Diagnosis not present

## 2022-10-10 DIAGNOSIS — I10 Essential (primary) hypertension: Secondary | ICD-10-CM | POA: Diagnosis not present

## 2022-10-10 DIAGNOSIS — E119 Type 2 diabetes mellitus without complications: Secondary | ICD-10-CM | POA: Diagnosis not present

## 2022-10-10 DIAGNOSIS — D72829 Elevated white blood cell count, unspecified: Secondary | ICD-10-CM | POA: Diagnosis not present

## 2022-10-10 DIAGNOSIS — R0602 Shortness of breath: Secondary | ICD-10-CM | POA: Diagnosis not present

## 2022-10-10 DIAGNOSIS — E860 Dehydration: Secondary | ICD-10-CM | POA: Diagnosis not present

## 2022-10-10 DIAGNOSIS — Z7982 Long term (current) use of aspirin: Secondary | ICD-10-CM | POA: Diagnosis not present

## 2022-10-10 DIAGNOSIS — R0609 Other forms of dyspnea: Secondary | ICD-10-CM | POA: Diagnosis not present

## 2022-10-10 DIAGNOSIS — Z85820 Personal history of malignant melanoma of skin: Secondary | ICD-10-CM | POA: Diagnosis not present

## 2022-10-11 DIAGNOSIS — R531 Weakness: Secondary | ICD-10-CM | POA: Diagnosis not present

## 2022-10-11 DIAGNOSIS — R0602 Shortness of breath: Secondary | ICD-10-CM | POA: Diagnosis not present

## 2022-10-15 ENCOUNTER — Ambulatory Visit: Payer: PPO | Admitting: Cardiology

## 2022-11-10 DIAGNOSIS — J019 Acute sinusitis, unspecified: Secondary | ICD-10-CM | POA: Diagnosis not present

## 2022-11-18 DIAGNOSIS — E039 Hypothyroidism, unspecified: Secondary | ICD-10-CM | POA: Diagnosis not present

## 2022-11-18 DIAGNOSIS — E559 Vitamin D deficiency, unspecified: Secondary | ICD-10-CM | POA: Diagnosis not present

## 2022-11-18 DIAGNOSIS — E1140 Type 2 diabetes mellitus with diabetic neuropathy, unspecified: Secondary | ICD-10-CM | POA: Diagnosis not present

## 2022-11-18 DIAGNOSIS — E782 Mixed hyperlipidemia: Secondary | ICD-10-CM | POA: Diagnosis not present

## 2022-11-24 ENCOUNTER — Encounter: Payer: Self-pay | Admitting: Internal Medicine

## 2022-11-24 DIAGNOSIS — I2510 Atherosclerotic heart disease of native coronary artery without angina pectoris: Secondary | ICD-10-CM | POA: Diagnosis not present

## 2022-11-24 DIAGNOSIS — D509 Iron deficiency anemia, unspecified: Secondary | ICD-10-CM | POA: Diagnosis not present

## 2022-11-24 DIAGNOSIS — E1142 Type 2 diabetes mellitus with diabetic polyneuropathy: Secondary | ICD-10-CM | POA: Diagnosis not present

## 2022-11-24 DIAGNOSIS — K219 Gastro-esophageal reflux disease without esophagitis: Secondary | ICD-10-CM | POA: Diagnosis not present

## 2022-11-24 DIAGNOSIS — G4700 Insomnia, unspecified: Secondary | ICD-10-CM | POA: Diagnosis not present

## 2022-11-24 DIAGNOSIS — E782 Mixed hyperlipidemia: Secondary | ICD-10-CM | POA: Diagnosis not present

## 2022-11-24 DIAGNOSIS — F411 Generalized anxiety disorder: Secondary | ICD-10-CM | POA: Diagnosis not present

## 2022-11-24 DIAGNOSIS — M810 Age-related osteoporosis without current pathological fracture: Secondary | ICD-10-CM | POA: Diagnosis not present

## 2022-11-24 DIAGNOSIS — L4050 Arthropathic psoriasis, unspecified: Secondary | ICD-10-CM | POA: Diagnosis not present

## 2022-11-24 DIAGNOSIS — E559 Vitamin D deficiency, unspecified: Secondary | ICD-10-CM | POA: Diagnosis not present

## 2022-11-24 DIAGNOSIS — E039 Hypothyroidism, unspecified: Secondary | ICD-10-CM | POA: Diagnosis not present

## 2022-11-24 DIAGNOSIS — I358 Other nonrheumatic aortic valve disorders: Secondary | ICD-10-CM | POA: Diagnosis not present

## 2022-11-26 ENCOUNTER — Telehealth: Payer: Self-pay | Admitting: Gastroenterology

## 2022-11-26 NOTE — Telephone Encounter (Signed)
I received the labs from 11/18/2022 that showed a normal vitamin D of 41.5, TSH 1.89, hemoglobin A1c 4.5, CMP with BUN 5, creatinine 0.61, potassium 3.3, sodium 144, chloride 111, albumin 3.0, total bilirubin 2.2, alkaline phosphatase 95, AST 45, ALT 22, CBC with WBC 5.1, hemoglobin 11.8, platelets 106.  She will need repeat labs in November 2024.  MELD 3.0 could not be calculated with these labs.

## 2023-01-14 ENCOUNTER — Inpatient Hospital Stay: Payer: PPO | Attending: Hematology

## 2023-01-14 DIAGNOSIS — E119 Type 2 diabetes mellitus without complications: Secondary | ICD-10-CM | POA: Insufficient documentation

## 2023-01-14 DIAGNOSIS — I10 Essential (primary) hypertension: Secondary | ICD-10-CM | POA: Insufficient documentation

## 2023-01-14 DIAGNOSIS — K7460 Unspecified cirrhosis of liver: Secondary | ICD-10-CM | POA: Insufficient documentation

## 2023-01-14 DIAGNOSIS — L4050 Arthropathic psoriasis, unspecified: Secondary | ICD-10-CM | POA: Diagnosis not present

## 2023-01-14 DIAGNOSIS — D509 Iron deficiency anemia, unspecified: Secondary | ICD-10-CM | POA: Diagnosis not present

## 2023-01-14 DIAGNOSIS — E538 Deficiency of other specified B group vitamins: Secondary | ICD-10-CM | POA: Insufficient documentation

## 2023-01-14 DIAGNOSIS — E611 Iron deficiency: Secondary | ICD-10-CM

## 2023-01-14 DIAGNOSIS — D696 Thrombocytopenia, unspecified: Secondary | ICD-10-CM

## 2023-01-14 DIAGNOSIS — D6959 Other secondary thrombocytopenia: Secondary | ICD-10-CM | POA: Insufficient documentation

## 2023-01-14 DIAGNOSIS — D61818 Other pancytopenia: Secondary | ICD-10-CM | POA: Insufficient documentation

## 2023-01-14 LAB — CBC WITH DIFFERENTIAL/PLATELET
Abs Immature Granulocytes: 0.01 10*3/uL (ref 0.00–0.07)
Basophils Absolute: 0 10*3/uL (ref 0.0–0.1)
Basophils Relative: 1 %
Eosinophils Absolute: 0.2 10*3/uL (ref 0.0–0.5)
Eosinophils Relative: 5 %
HCT: 33.2 % — ABNORMAL LOW (ref 36.0–46.0)
Hemoglobin: 10.9 g/dL — ABNORMAL LOW (ref 12.0–15.0)
Immature Granulocytes: 0 %
Lymphocytes Relative: 26 %
Lymphs Abs: 1 10*3/uL (ref 0.7–4.0)
MCH: 33.3 pg (ref 26.0–34.0)
MCHC: 32.8 g/dL (ref 30.0–36.0)
MCV: 101.5 fL — ABNORMAL HIGH (ref 80.0–100.0)
Monocytes Absolute: 0.4 10*3/uL (ref 0.1–1.0)
Monocytes Relative: 10 %
Neutro Abs: 2.1 10*3/uL (ref 1.7–7.7)
Neutrophils Relative %: 58 %
Platelets: 74 10*3/uL — ABNORMAL LOW (ref 150–400)
RBC: 3.27 MIL/uL — ABNORMAL LOW (ref 3.87–5.11)
RDW: 14.1 % (ref 11.5–15.5)
WBC: 3.7 10*3/uL — ABNORMAL LOW (ref 4.0–10.5)
nRBC: 0 % (ref 0.0–0.2)

## 2023-01-14 LAB — COMPREHENSIVE METABOLIC PANEL
ALT: 16 U/L (ref 0–44)
AST: 34 U/L (ref 15–41)
Albumin: 2.6 g/dL — ABNORMAL LOW (ref 3.5–5.0)
Alkaline Phosphatase: 81 U/L (ref 38–126)
Anion gap: 5 (ref 5–15)
BUN: 6 mg/dL — ABNORMAL LOW (ref 8–23)
CO2: 23 mmol/L (ref 22–32)
Calcium: 7.9 mg/dL — ABNORMAL LOW (ref 8.9–10.3)
Chloride: 110 mmol/L (ref 98–111)
Creatinine, Ser: 0.64 mg/dL (ref 0.44–1.00)
GFR, Estimated: >60 mL/min (ref >60)
Glucose, Bld: 90 mg/dL (ref 70–99)
Potassium: 3.7 mmol/L (ref 3.5–5.1)
Sodium: 138 mmol/L (ref 135–145)
Total Bilirubin: 2.3 mg/dL — ABNORMAL HIGH (ref 0.3–1.2)
Total Protein: 5.2 g/dL — ABNORMAL LOW (ref 6.5–8.1)

## 2023-01-14 LAB — IRON AND TIBC
Iron: 69 ug/dL (ref 28–170)
Saturation Ratios: 34 % — ABNORMAL HIGH (ref 10.4–31.8)
TIBC: 201 ug/dL — ABNORMAL LOW (ref 250–450)
UIBC: 132 ug/dL

## 2023-01-14 LAB — FERRITIN: Ferritin: 119 ng/mL (ref 11–307)

## 2023-01-20 NOTE — Progress Notes (Unsigned)
[facility] [address], [place] [address]   CLINIC:  Medical Oncology/Hematology  PCP:  [name], MD [address] [place] [address] [phone]   REASON FOR VISIT:  Follow-up for anemia and thrombocytopenia  PRIOR THERAPY: None  CURRENT THERAPY: Surveillance  INTERVAL HISTORY:   Ms. Rebecca Hartman 77 y.o. female returns for routine follow-up of anemia and thrombocytopenia.  She was last seen by Rojelio Brenner PA-C on 07/15/2022.  At today's visit, she reports feeling *** apart from fatigue.  No recent hospitalizations, surgeries, or changes in baseline health status.  She has occasional epistaxis associated with seasonal allergies. *** *** She reports that her stool has been dark ever since she started iron pills, but she denies any hematemesis, hematochezia, melena, or hematuria.   ***She admits to easy bruising but denies petechial rash.     She continues to have chronic baseline fatigue.  *** ***She has occasional chest pain (followed by cardiology).  *** She denies any recent headaches, palpitations, pica, lightheadedness, syncope or dyspnea on exertion.  No recent or recurrent infections.  *** *** She has not noticed any new lumps or bumps.   *** She denies any B symptoms such as fever, chills, or night sweats.   ***She continues to report poor appetite and gradual weight loss (has lost 3 pounds in the past 3 months).  She does not like the taste of Boost or Ensure.  *** Rheumatology medications???  *** *** Still taking B12 and folate?  She has little to no*** energy and 100***% appetite. She endorses that she is maintaining a stable weight.   ASSESSMENT & PLAN:  1.  Thrombocytopenia (cirrhosis/splenomegaly), with history of drug-induced pancytopenia (resolved) - Seen at the request of rheumatologist (Dr. Zenovia Jordan) for evaluation of pancytopenia. - Per medical records sent by Dr. Nickola Major, patient was noted to have onset of  pancytopenia in November 2022. - She has been on methotrexate, Plaquenil, and Enbrel for psoriatic arthritis.   - Due to pancytopenia, methotrexate and Plaquenil were held by rheumatologist as of 02/18/2021.  She has been off of Enbrel for the past few months while waiting for financial assistance.  No improvement in her platelets while Enbrel has been held.*** - Hemoglobin and white blood cells returned to normal within 6 months of the discontinuation of methotrexate.  Platelets remained low.  *** Placed back on methotrexate or other medication??  *** - Workup revealed Vitamin B12 deficiency, borderline iron deficiency, and hemoccult positive stool.  Otherwise, unremarkable with normal flow cytometry and SPEP.  Negative hepatitis panel. - RUQ abdomen US (08/09/2021): Findings suspicious for hepatic cirrhosis but with normal hepatic elastography - Splenic ultrasound (09/16/2021): Splenomegaly with maximum dimension 15.3 cm - Most recent CBC (01/14/2023): Platelets 74, at baseline.  Recurrent anemia and leukopenia with Hgb 10.9, WBC 3.7, normal differential.  Normal ferritin 119, iron saturation 34%.  Creatinine 0.64.  (B12 and folate normal when checked in October 2024). - No B symptoms or frequent infections.     *** - No lymphadenopathy or splenomegaly palpated on exam.     *** - Mild pancytopenia has resolved after methotrexate was discontinued, but she has persistent moderate thrombocytopenia which is likely secondary to liver disease/splenomegaly and possible autoimmune thrombocytopenia versus medication effect.*** - PLAN: Differential diagnosis and work-up for persistent thrombocytopenia includes: Thrombocytopenia related to liver disease and splenomegaly Autoimmune thrombocytopenia: We will consider trial of steroids if platelets <50 MDS or other bone marrow infiltrative process:  No indication for bone marrow biopsy since other cell lines have normalized.  Would consider bone marrow biopsy if she  has persistent cytopenias or development of B symptoms.  Would consider CT CAP in the presence of B symptoms due to increased risk of lymphoproliferative disorders in patients with autoimmune disease. - Repeat labs and RTC in 3 months.   2.  Mild anemia with marginal iron deficiency, RESOLVED - History of GI bleed in 2012 due to stomach ulcer, required blood transfusions and iron infusions. - History of IV iron infusions, last given in June 2015 - Hemoccult stool POSITIVE x3 in February 2023 - EGD (09/18/2021): Grade 1 esophageal varices, portal hypertensive gastropathy - Colonoscopy (09/18/2021): Congested mucosa in ascending colon and rectum corresponding with portal colopathy - Taking ferrous sulfate 325 mg 3 times weekly*** - Most recent labs (01/14/2023):  Most recent CBC (01/14/2023): Recurrent anemia and leukopenia with Hgb 10.9, WBC 3.7, normal differential.  Normal ferritin 119, iron saturation 34%.    - Reports daily nosebleeds described as "large clots coming out of her nose when she sneezes".  She has been seen by ENT - No bright red blood per rectum, melena, or hematuria    *** - PLAN: Recurrent anemia with normal creatinine and normal iron stores.  Continue ferrous sulfate 325 mg 3 times weekly - *** Check Cystatin C, B12/MMA, folate *** - Continue follow-up with GI and ENT - Repeat labs and RTC in 3 months     3.  Vitamin B12 deficiency - Labs from 05/14/2021 show normal vitamin B12 at 295, but elevated methylmalonic acid 938 - Vitamin B12 injection given on 06/04/2021 - Has been taking vitamin B12 1000 mcg daily since March *** - Most recent labs (07/08/2022): Vitamin B12 1282 with folate 7.3.  Homocystine and MMA normal. - PLAN: Continue vitamin B12 1000 mcg daily.     - Recheck vitamin B12 and folate now due to recurrent anemia   4.  Fatigue - Chronic multifactorial fatigue related to fibromyalgia, autoimmune disease (psoriatic arthritis), physical deconditioning, and heart  disease - She may also have some aspect of fatigue from her vitamin B12 and iron deficiency - she has had slightly improved energy after B12 and iron repletion  5.  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  No tobacco, alcohol, or illicit drug use. - FAMILY: Mother had strokes.  Father had heart problems. Brother had prostate cancer.  PLAN SUMMARY: >> ***     REVIEW OF SYSTEMS: ***  Review of Systems  Constitutional:  Positive for fatigue. Negative for appetite change, chills, diaphoresis, fever and unexpected weight change.  HENT:   Negative for lump/mass and nosebleeds.   Eyes:  Negative for eye problems.  Respiratory:  Negative for cough, hemoptysis and shortness of breath.   Cardiovascular:  Negative for chest pain, leg swelling and palpitations.  Gastrointestinal:  Negative for abdominal pain, blood in stool, constipation, diarrhea, nausea and vomiting.  Genitourinary:  Negative for hematuria.   Skin:  Positive for rash.  Neurological:  Negative for dizziness, headaches and light-headedness.  Hematological:  Does not bruise/bleed easily.     PHYSICAL EXAM:  ECOG PERFORMANCE STATUS: 2 - Symptomatic, <50% confined to bed *** There were no vitals filed for this visit. There were no vitals filed for this visit. Physical Exam Constitutional:      Appearance: Normal  appearance. She is obese.  HENT:     Head: Normocephalic and atraumatic.     Mouth/Throat:     Mouth: Mucous membranes are moist.  Eyes:     Extraocular Movements: Extraocular movements intact.     Pupils: Pupils are equal, round, and reactive to light.  Cardiovascular:     Rate and Rhythm: Regular rhythm. Bradycardia present.     Pulses: Normal pulses.     Heart sounds: Murmur heard.  Pulmonary:     Effort: Pulmonary  effort is normal.     Breath sounds: Normal breath sounds.  Abdominal:     General: Bowel sounds are normal.     Palpations: Abdomen is soft.     Tenderness: There is no abdominal tenderness.  Musculoskeletal:        General: No swelling.     Right lower leg: No edema.     Left lower leg: No edema.  Lymphadenopathy:     Cervical: No cervical adenopathy.  Skin:    General: Skin is warm and dry.     Findings: Rash (Psoriatic rash) present.  Neurological:     General: No focal deficit present.     Mental Status: She is alert and oriented to person, place, and time.  Psychiatric:        Mood and Affect: Mood normal.        Behavior: Behavior normal.     PAST MEDICAL/SURGICAL HISTORY:  Past Medical History:  Diagnosis Date   Anemia    Anxiety    Arthritis    Cataract    Chronic back pain    Coronary artery disease    Multivessel disease, status post DES to mid LAD 05/2020   Essential hypertension    Fibromyalgia    Hiatal hernia    History of blood transfusion    Hypercholesteremia    Hypothyroidism    Osteoporosis    Type 2 diabetes mellitus (HCC)    Past Surgical History:  Procedure Laterality Date   ABDOMINAL HYSTERECTOMY     BACK SURGERY     C-EYE SURGERY PROCEDURE     CARPAL TUNNEL RELEASE     CATARACT EXTRACTION     CHOLECYSTECTOMY     COLONOSCOPY  08/06/06 [facility]   COLONOSCOPY WITH PROPOFOL N/A 09/18/2021   Procedure: COLONOSCOPY WITH PROPOFOL;  Surgeon: Dolores Frame, MD;  [facility]: [facility];  Service: Gastroenterology;  Laterality: N/A;  945   CORONARY IMAGING/OCT N/A 05/24/2020   Procedure: INTRAVASCULAR IMAGING/OCT;  Surgeon: Kathleene Hazel, MD;  [facility]: [facility];  Service: Cardiovascular;  Laterality: N/A;   CORONARY PRESSURE/FFR STUDY N/A 05/24/2020   Procedure: INTRAVASCULAR PRESSURE WIRE/FFR STUDY;  Surgeon: Kathleene Hazel, MD;  Location: [facility];  Service: Cardiovascular;  Laterality: N/A;    CORONARY STENT INTERVENTION  05/24/2020   CORONARY STENT INTERVENTION N/A 05/24/2020   Procedure: CORONARY STENT INTERVENTION;  Surgeon: Kathleene Hazel, MD;  [facility]: [facility];  Service: Cardiovascular;  Laterality: N/A;   DENTAL SURGERY  04/2015   reconstructive surgery lower   ESOPHAGOGASTRODUODENOSCOPY  10/31/2010   Procedure: ESOPHAGOGASTRODUODENOSCOPY (EGD);  Surgeon: Malissa Hippo, MD;  [facility]: [facility];  Service: Endoscopy;  Laterality: N/A;   ESOPHAGOGASTRODUODENOSCOPY  03/05/2011   Procedure: ESOPHAGOGASTRODUODENOSCOPY (EGD);  Surgeon: Malissa Hippo, MD;  [facility]: [facility];  Service: Endoscopy;  Laterality: N/A;  12:00   ESOPHAGOGASTRODUODENOSCOPY  04/28/2012   Procedure: ESOPHAGOGASTRODUODENOSCOPY (EGD);  Surgeon: Joline Maxcy  Karilyn Cota, MD;  [facility]: [facility];  Service: Endoscopy;  Laterality: N/A;  140-moved to 10:30 Ann notified pt   ESOPHAGOGASTRODUODENOSCOPY (EGD) WITH PROPOFOL N/A 09/18/2021   Procedure: ESOPHAGOGASTRODUODENOSCOPY (EGD) WITH PROPOFOL;  Surgeon: Dolores Frame, MD;  [facility];  Service: Gastroenterology;  Laterality: N/A;   EYE SURGERY     LEFT HEART CATH AND CORONARY ANGIOGRAPHY N/A 05/24/2020   Procedure: LEFT HEART CATH AND CORONARY ANGIOGRAPHY;  Surgeon: Kathleene Hazel, MD;  [facility];  Service: Cardiovascular;  Laterality: N/A;   TONSILLECTOMY     TOTAL HIP REVISION     UPPER GASTROINTESTINAL ENDOSCOPY  10/03/2010   EGD DILATN PYLORIC CHANNEL   UPPER GASTROINTESTINAL ENDOSCOPY  07/08/2010   UPPER GASTROINTESTINAL ENDOSCOPY  08/06/06   FIELDS   UPPER GASTROINTESTINAL ENDOSCOPY  06/29/2008   EGD    SOCIAL HISTORY:  Social History   Socioeconomic History   Marital status: Married    Spouse name: Not on file   Number of children: Not on file   Years of education: Not on file   Highest education level: Not on file  Occupational History   Occupation: retired  Tobacco  Use   Smoking status: Never   Smokeless tobacco: Never  Vaping Use   Vaping status: Never Used  Substance and Sexual Activity   Alcohol use: No    Alcohol/week: 0.0 standard drinks of alcohol   Drug use: No   Sexual activity: Yes    Birth control/protection: Post-menopausal  Other Topics Concern   Not on file  Social History Narrative   Not on file   Social Determinants of Health   Financial Resource Strain: Not on file  Food Insecurity: Not on file  Transportation Needs: Not on file  Physical Activity: Not on file  Stress: Not on file  Social Connections: Not on file  Intimate Partner Violence: Not on file    FAMILY HISTORY:  Family History  Problem Relation Age of Onset   Diabetes Mother    Stroke Mother    Hyperlipidemia Mother    Hypertension Mother    Stroke Father    Heart disease Father    Hyperlipidemia Father    Hypertension Father    Healthy Son     CURRENT MEDICATIONS:  Outpatient Encounter Medications as of 01/21/2023  Medication Sig   ACCU-CHEK AVIVA PLUS test strip as needed.   acetaminophen (TYLENOL) 500 MG tablet Take 500-1,000 mg by mouth every 6 (six) hours as needed (pain).   aluminum-magnesium hydroxide 200-200 MG/5ML suspension Take by mouth every 6 (six) hours as needed for indigestion. 15-30 ml prn   ascorbic acid (VITAMIN C) 500 MG tablet Take 500 mg by mouth daily.   atorvastatin (LIPITOR) 40 MG tablet Take 40 mg by mouth daily.   calcium carbonate (TUMS - DOSED IN MG ELEMENTAL CALCIUM) 500 MG chewable tablet Chew 1-2 tablets by mouth 3 (three) times daily as needed for indigestion or heartburn.   cholecalciferol (VITAMIN D3) 25 MCG (1000 UNIT) tablet Take 1,000 Units by mouth in the morning.   clopidogrel (PLAVIX) 75 MG tablet Take 1 tablet (75 mg total) by mouth daily with breakfast.   dextromethorphan-guaiFENesin (MUCINEX DM) 30-600 MG 12hr tablet Take 1 tablet by mouth as needed for cough.   diltiazem (CARDIZEM CD) 120 MG 24 hr capsule  Take 1 capsule (120 mg total) by mouth daily. (Patient taking differently: Take 120 mg by mouth every evening.)   empagliflozin (JARDIANCE) 25  MG TABS tablet Take 25 mg by mouth in the morning.   escitalopram (LEXAPRO) 10 MG tablet Take 10 mg by mouth as needed.   etanercept (ENBREL) 50 MG/ML injection Inject 50 mg into the skin every Monday. (Patient not taking: Reported on 08/18/2022)   FEROSUL 325 (65 Fe) MG tablet Take 325 mg by mouth daily with breakfast. Tues, Thurs, and Sat.   fluconazole (DIFLUCAN) 100 MG tablet Take 100 mg by mouth as needed.   fluocinonide ointment (LIDEX) 0.05 % Apply 1 Application topically as needed.   isosorbide mononitrate (IMDUR) 30 MG 24 hr tablet TAKE 1 TABLET BY MOUTH ONCE DAILY.   JARDIANCE 25 MG TABS tablet Take 25 mg by mouth daily.   lactulose (CHRONULAC) 10 GM/15ML solution Take 30 mLs (20 g total) by mouth daily.   levothyroxine (SYNTHROID, LEVOTHROID) 50 MCG tablet Take 50 mcg by mouth daily before breakfast.   metoprolol tartrate (LOPRESSOR) 50 MG tablet TAKE (1) TABLET BY MOUTH TWICE DAILY.   mupirocin cream (BACTROBAN) 2 % Apply 1 Application topically as needed.   nitroGLYCERIN (NITROSTAT) 0.4 MG SL tablet PLACE 1 TAB UNDER TONGUE EVERY 5 MIN IF NEEDED FOR CHEST PAIN. MAY USE 3 TIMES.NO RELIEF CALL 911.   pantoprazole (PROTONIX) 40 MG tablet Take 40 mg by mouth in the morning.   Polyethyl Glycol-Propyl Glycol (SYSTANE) 0.4-0.3 % SOLN Apply 1-2 drops to eye 4 (four) times daily as needed (dry/irritated eyes.).   temazepam (RESTORIL) 30 MG capsule Take 30 mg by mouth at bedtime.   Vitamin A 2400 MCG (8000 UT) CAPS Take 2,400 mcg by mouth in the morning.   vitamin B-12 (CYANOCOBALAMIN) 1000 MCG tablet Take 1,000 mcg by mouth 3 (three) times a week.   No facility-administered encounter medications on file as of 01/21/2023.    ALLERGIES:  Allergies  Allergen Reactions   Shellfish Allergy Itching and Nausea And Vomiting   Tussigon [Hydrocodone  Bit-Homatrop Mbr]     Per patient's husband , this medication caused confusion.   Hydrocodone Itching   Glyburide     Sudden Drop in Blood Sugars   Levofloxacin Itching   Zestril [Lisinopril] Other (See Comments)    SEVERE DROP IN PRESSURE   Morphine Itching and Nausea Only    LABORATORY DATA:  I have reviewed the labs as listed.  CBC    Component Value Date/Time   WBC 3.7 (L) 01/14/2023 1458   RBC 3.27 (L) 01/14/2023 1458   HGB 10.9 (L) 01/14/2023 1458   HGB 11.3 08/20/2022 1412   HCT 33.2 (L) 01/14/2023 1458   HCT 32.9 (L) 08/20/2022 1412   PLT 74 (L) 01/14/2023 1458   PLT 81 (LL) 08/20/2022 1412   MCV 101.5 (H) 01/14/2023 1458   MCV 98 (H) 08/20/2022 1412   MCH 33.3 01/14/2023 1458   MCHC 32.8 01/14/2023 1458   RDW 14.1 01/14/2023 1458   RDW 13.1 08/20/2022 1412   LYMPHSABS 1.0 01/14/2023 1458   LYMPHSABS 0.9 08/20/2022 1412   MONOABS 0.4 01/14/2023 1458   EOSABS 0.2 01/14/2023 1458   EOSABS 0.2 08/20/2022 1412   BASOSABS 0.0 01/14/2023 1458   BASOSABS 0.0 08/20/2022 1412      Latest Ref Rng & Units 01/14/2023    2:58 PM 08/20/2022    2:12 PM 07/15/2022    2:48 PM  CMP  Glucose 70 - 99 mg/dL 90  161  096   BUN 8 - 23 mg/dL 6  5  5    Creatinine 0.44 -  1.00 mg/dL 1.61  0.96  0.45   Sodium 135 - 145 mmol/L 138  141  136   Potassium 3.5 - 5.1 mmol/L 3.7  3.4  3.6   Chloride 98 - 111 mmol/L 110  107  110   CO2 22 - 32 mmol/L 23  19  21    Calcium 8.9 - 10.3 mg/dL 7.9  8.3  8.0   Total Protein 6.5 - 8.1 g/dL 5.2  5.7    Total Bilirubin 0.3 - 1.2 mg/dL 2.3  1.7    Alkaline Phos 38 - 126 U/L 81  89    AST 15 - 41 U/L 34  44    ALT 0 - 44 U/L 16  24      DIAGNOSTIC IMAGING:  I have independently reviewed the relevant imaging and discussed with the patient.   WRAP UP:  All questions were answered. The patient knows to call the clinic with any problems, questions or concerns.  Medical decision making: Moderate  Time spent on visit: I spent 20 minutes  counseling the patient face to face. The total time spent in the appointment was 30 minutes and more than 50% was on counseling.  Carnella Guadalajara, PA-C  ***

## 2023-01-21 ENCOUNTER — Inpatient Hospital Stay (HOSPITAL_BASED_OUTPATIENT_CLINIC_OR_DEPARTMENT_OTHER): Payer: PPO | Admitting: Physician Assistant

## 2023-01-21 VITALS — BP 110/70 | HR 54 | Temp 98.7°F | Resp 18 | Wt 136.0 lb

## 2023-01-21 DIAGNOSIS — E538 Deficiency of other specified B group vitamins: Secondary | ICD-10-CM

## 2023-01-21 DIAGNOSIS — D61818 Other pancytopenia: Secondary | ICD-10-CM

## 2023-01-21 DIAGNOSIS — D6959 Other secondary thrombocytopenia: Secondary | ICD-10-CM | POA: Diagnosis not present

## 2023-01-21 DIAGNOSIS — D696 Thrombocytopenia, unspecified: Secondary | ICD-10-CM

## 2023-01-21 DIAGNOSIS — E611 Iron deficiency: Secondary | ICD-10-CM

## 2023-01-21 NOTE — Patient Instructions (Signed)
[facility] at [facility] **VISIT SUMMARY & IMPORTANT INSTRUCTIONS **   You were seen today by Rojelio Brenner PA-C for your follow-up visit.    LOW PLATELETS & LOW WHITE BLOOD CELLS Your platelets/WBCs today are low but stable. Your low platelets/WBCs are most likely related to your underlying liver disease and enlarged spleen.  They may also be related to your autoimmune disease.  IRON DEFICIENCY ANEMIA Your mild persistent anemia is related to your chronic disease. We will check additional labs at follow-up visit in 3 months.    LABS: Return in 3 months for repeat labs  MEDICATIONS: - Iron tablet 3 days a week - Vitamin B12 tablet daily  FOLLOW-UP APPOINTMENT: Office visit in 3 months, 1 week after labs  ** Thank you for trusting me with your healthcare!  I strive to provide all of my patients with quality care at each visit.  If you receive a survey for this visit, I would be so grateful to you for taking the time to provide feedback.  Thank you in advance!  ~ Kayman Snuffer                   Dr. Doreatha Massed   &   Rojelio Brenner, PA-C   - - - - - - - - - - - - - - - - - -    Thank you for choosing [facility] at [facility] to provide your oncology and hematology care.  To afford each patient quality time with our provider, please arrive at least 15 minutes before your scheduled appointment time.   If you have a lab appointment with the Cancer Center please come in thru the Main Entrance and check in at the main information desk.  You need to re-schedule your appointment should you arrive 10 or more minutes late.  We strive to give you quality time with our providers, and arriving late affects you and other patients whose appointments are after yours.  Also, if you no show three or more times for appointments you may be dismissed from the clinic at the providers discretion.     Again, thank you for choosing [facility].  Our hope is that these requests will decrease the amount of time that you wait before being seen by our physicians.       _____________________________________________________________  Should you have questions after your visit to [facility], please contact our office at [phone] and follow the prompts.  Our office hours are 8:00 a.m. and 4:30 p.m. Monday - Friday.  Please note that voicemails left after 4:00 p.m. may not be returned until the following business day.  We are closed weekends and major holidays.  You do have access to a nurse 24-7, just call the main number to the clinic [phone] and do not press any options, hold on the line and a nurse will answer the phone.    For prescription refill requests, have your pharmacy contact our office and allow 72 hours.

## 2023-02-11 DIAGNOSIS — Z23 Encounter for immunization: Secondary | ICD-10-CM | POA: Diagnosis not present

## 2023-02-16 ENCOUNTER — Ambulatory Visit (INDEPENDENT_AMBULATORY_CARE_PROVIDER_SITE_OTHER): Payer: PPO | Admitting: Gastroenterology

## 2023-02-16 ENCOUNTER — Encounter (INDEPENDENT_AMBULATORY_CARE_PROVIDER_SITE_OTHER): Payer: Self-pay | Admitting: Gastroenterology

## 2023-02-16 VITALS — BP 120/62 | HR 46 | Temp 98.4°F | Ht 64.0 in | Wt 134.4 lb

## 2023-02-16 DIAGNOSIS — K7460 Unspecified cirrhosis of liver: Secondary | ICD-10-CM | POA: Diagnosis not present

## 2023-02-16 DIAGNOSIS — K7581 Nonalcoholic steatohepatitis (NASH): Secondary | ICD-10-CM

## 2023-02-16 MED ORDER — LACTULOSE 10 GM/15ML PO SOLN: 20.0000 g | Freq: Every day | ORAL | 4 refills | Status: AC

## 2023-02-16 NOTE — Progress Notes (Signed)
Referring Provider: [name], MD Primary Care Physician:  [name], MD Primary GI Physician: Dr. Levon Hedger   Chief Complaint  Patient presents with   Cirrhosis    Follow up on cirrhosis. States doing well and no concerns.    HPI:   Rebecca Hartman is a 77 y.o. female with past medical history of coronary artery disease status post stent placement on Plavix, cirrhosis possibly due to NASH c/b non bleeding EV, psoriatic arthritis,, hypertension, fibromyalgia, hyperlipidemia, hypothyroidism, type 2 diabetes, osteoporosis   Patient presenting today for follow-up of cirrhosis  Patient last seen in May 2024, at that time reported feeling somewhat better, denied any abdominal pain.  Has lost 8 pounds since her last visit with worsening appetite.  She denied any abdominal distention, jaundice, pruritus  Patient recommended to check CBC CMP, hepatitis A total antibody, hepatitis B surface antibody, acute hepatitis panel, AMA, ASMA, ANA, IgG, CMP and INR.  Advised to increase intake of lactulose to at least once a day to achieve 2-3 soft bowel movements per day.  Schedule liver ultrasound.  Labs negative, showed immunity to Hep A but not Hep B, advised to obtain Hep B vaccine with PCP  Present: Patient states she is feeling pretty good. She denies any swelling to her abdomen or her legs. She reports that she has some "mild dementia" so she has some confusion at times. She is taking lactulose nightly and having 2-3 BMs per day. Denies rectal bleeding or melena. No nausea or vomiting. Appetite is good. Weight is stable. Denies jaundice or pruritus.   Previous MELD 13 (may 2024)  Cirrhosis related questions: Episodes of confusion/disorientation: as above, pt reports history of dementia  Taking diuretics? none Beta blockers? None  Prior history of variceal banding? no Prior episodes of SBP? No  Last liver imaging: [place] in June 2024, no masses, AFP negative at that time  Alcohol use:  none  Last EGD: 09/18/2021 - Grade I esophageal varices. - 3 cm hiatal hernia. - Gastroesophageal flap valve classified as Hill Grade IV (no fold, wide open lumen, hiatal hernia present). - Portal hypertensive gastropathy. - Normal examined duodenum. - No specimens collected.   Recommended repeat in 2 years   Last Colonoscopy: 09/18/2021 - Congested mucosa in the ascending colon and in the cecum. Portal colopathy. - The examination was otherwise normal. - Non-bleeding internal hemorrhoids.    Past Medical History:  Diagnosis Date   Anemia    Anxiety    Arthritis    Cataract    Chronic back pain    Coronary artery disease    Multivessel disease, status post DES to mid LAD 05/2020   Essential hypertension    Fibromyalgia    Hiatal hernia    History of blood transfusion    Hypercholesteremia    Hypothyroidism    Osteoporosis    Type 2 diabetes mellitus (HCC)     Past Surgical History:  Procedure Laterality Date   ABDOMINAL HYSTERECTOMY     BACK SURGERY     C-EYE SURGERY PROCEDURE     CARPAL TUNNEL RELEASE     CATARACT EXTRACTION     CHOLECYSTECTOMY     COLONOSCOPY  08/06/06 [facility]ELDS   COLONOSCOPY WITH PROPOFOL N/A 09/18/2021   Procedure: COLONOSCOPY WITH PROPOFOL;  Surgeon: Dolores Frame, MD;  [facility];  Service: Gastroenterology;  Laterality: N/A;  945   CORONARY IMAGING/OCT N/A 05/24/2020   Procedure: INTRAVASCULAR IMAGING/OCT;  Surgeon: Kathleene Hazel,  MD;  [facility];  Service: Cardiovascular;  Laterality: N/A;   CORONARY PRESSURE/FFR STUDY N/A 05/24/2020   Procedure: INTRAVASCULAR PRESSURE WIRE/FFR STUDY;  Surgeon: Kathleene Hazel, MD;  [facility];  Service: Cardiovascular;  Laterality: N/A;   CORONARY STENT INTERVENTION  05/24/2020   CORONARY STENT INTERVENTION N/A 05/24/2020   Procedure: CORONARY STENT INTERVENTION;  Surgeon: Kathleene Hazel, MD;  [facility];   Service: Cardiovascular;  Laterality: N/A;   DENTAL SURGERY  04/2015   reconstructive surgery lower   ESOPHAGOGASTRODUODENOSCOPY  10/31/2010   Procedure: ESOPHAGOGASTRODUODENOSCOPY (EGD);  Surgeon: Malissa Hippo, MD;  [facility];  Service: Endoscopy;  Laterality: N/A;   ESOPHAGOGASTRODUODENOSCOPY  03/05/2011   Procedure: ESOPHAGOGASTRODUODENOSCOPY (EGD);  Surgeon: Malissa Hippo, MD;  [facility];  Service: Endoscopy;  Laterality: N/A;  12:00   ESOPHAGOGASTRODUODENOSCOPY  04/28/2012   Procedure: ESOPHAGOGASTRODUODENOSCOPY (EGD);  Surgeon: Malissa Hippo, MD;  [facility];  Service: Endoscopy;  Laterality: N/A;  140-moved to 10:30 Ann notified pt   ESOPHAGOGASTRODUODENOSCOPY (EGD) WITH PROPOFOL N/A 09/18/2021   Procedure: ESOPHAGOGASTRODUODENOSCOPY (EGD) WITH PROPOFOL;  Surgeon: Dolores Frame, MD;  [facility];  Service: Gastroenterology;  Laterality: N/A;   EYE SURGERY     LEFT HEART CATH AND CORONARY ANGIOGRAPHY N/A 05/24/2020   Procedure: LEFT HEART CATH AND CORONARY ANGIOGRAPHY;  Surgeon: Kathleene Hazel, MD;  [facility];  Service: Cardiovascular;  Laterality: N/A;   TONSILLECTOMY     TOTAL HIP REVISION     UPPER GASTROINTESTINAL ENDOSCOPY  10/03/2010   EGD DILATN PYLORIC CHANNEL   UPPER GASTROINTESTINAL ENDOSCOPY  07/08/2010   UPPER GASTROINTESTINAL ENDOSCOPY  08/06/06   [facility]EL[facility]   UPPER GASTROINTESTINAL ENDOSCOPY  06/29/2008   EGD    Current Outpatient Medications  Medication Sig Dispense Refill   ACCU-CHEK AVIVA PLUS test strip as needed.     acetaminophen (TYLENOL) 500 MG tablet Take 500-1,000 mg by mouth every 6 (six) hours as needed (pain).     aluminum-magnesium hydroxide 200-200 MG/5ML suspension Take by mouth every 6 (six) hours as needed for indigestion. 15-30 ml prn     ascorbic acid (VITAMIN C) 500 MG tablet Take 500 mg by mouth daily.     atorvastatin (LIPITOR) 40 MG tablet Take 40 mg by  mouth daily.     calcium carbonate (TUMS - DOSED IN MG ELEMENTAL CALCIUM) 500 MG chewable tablet Chew 1-2 tablets by mouth 3 (three) times daily as needed for indigestion or heartburn.     cholecalciferol (VITAMIN D3) 25 MCG (1000 UNIT) tablet Take 1,000 Units by mouth in the morning.     clopidogrel (PLAVIX) 75 MG tablet Take 1 tablet (75 mg total) by mouth daily with breakfast. 90 tablet 3   diltiazem (CARDIZEM CD) 120 MG 24 hr capsule Take 1 capsule (120 mg total) by mouth daily. (Patient taking differently: Take 120 mg by mouth every evening.) 90 capsule 3   escitalopram (LEXAPRO) 10 MG tablet Take 10 mg by mouth as needed.     FEROSUL 325 (65 Fe) MG tablet Take 325 mg by mouth daily with breakfast. Tues, Thurs, and Sat.     fluconazole (DIFLUCAN) 100 MG tablet Take 100 mg by mouth as needed.     fluocinonide ointment (LIDEX) 0.05 % Apply 1 Application topically as needed.     isosorbide mononitrate (IMDUR) 30 MG 24 hr tablet TAKE 1 TABLET BY MOUTH  ONCE DAILY. 90 tablet 3   JARDIANCE 25 MG TABS tablet Take 25 mg by mouth daily.     levothyroxine (SYNTHROID, LEVOTHROID) 50 MCG tablet Take 50 mcg by mouth daily before breakfast.     metoprolol tartrate (LOPRESSOR) 50 MG tablet TAKE (1) TABLET BY MOUTH TWICE DAILY. 180 tablet 0   mupirocin cream (BACTROBAN) 2 % Apply 1 Application topically as needed.     nitroGLYCERIN (NITROSTAT) 0.4 MG SL tablet PLACE 1 TAB UNDER TONGUE EVERY 5 MIN IF NEEDED FOR CHEST PAIN. MAY USE 3 TIMES.NO RELIEF CALL 911. 25 tablet 3   pantoprazole (PROTONIX) 40 MG tablet Take 40 mg by mouth in the morning.     Polyethyl Glycol-Propyl Glycol (SYSTANE) 0.4-0.3 % SOLN Apply 1-2 drops to eye 4 (four) times daily as needed (dry/irritated eyes.).     temazepam (RESTORIL) 30 MG capsule Take 30 mg by mouth at bedtime.     Vitamin A 2400 MCG (8000 UT) CAPS Take 2,400 mcg by mouth in the morning.     vitamin B-12 (CYANOCOBALAMIN) 1000 MCG tablet Take 1,000 mcg by mouth 3 (three)  times a week.     No current facility-administered medications for this visit.    Allergies as of 02/16/2023 - Review Complete 02/16/2023  Allergen Reaction Noted   Shellfish allergy Itching and Nausea And Vomiting 10/23/2010   Tussigon [hydrocodone bit-homatrop mbr]  05/09/2013   Hydrocodone Itching 08/31/2012   Glyburide  01/06/2011   Levofloxacin Itching 09/03/2010   Zestril [lisinopril] Other (See Comments) 10/23/2010   Morphine Itching and Nausea Only     Family History  Problem Relation Age of Onset   Diabetes Mother    Stroke Mother    Hyperlipidemia Mother    Hypertension Mother    Stroke Father    Heart disease Father    Hyperlipidemia Father    Hypertension Father    Healthy Son     Social History   Socioeconomic History   Marital status: Married    Spouse name: Not on file   Number of children: Not on file   Years of education: Not on file   Highest education level: Not on file  Occupational History   Occupation: retired  Tobacco Use   Smoking status: Never   Smokeless tobacco: Never  Vaping Use   Vaping status: Never Used  Substance and Sexual Activity   Alcohol use: No    Alcohol/week: 0.0 standard drinks of alcohol   Drug use: No   Sexual activity: Yes    Birth control/protection: Post-menopausal  Other Topics Concern   Not on file  Social History Narrative   Not on file   Social Determinants of Health   Financial Resource Strain: Not on file  Food Insecurity: Not on file  Transportation Needs: Not on file  Physical Activity: Not on file  Stress: Not on file  Social Connections: Not on file    Review of systems General: negative for malaise, night sweats, fever, chills, weight loss Neck: Negative for lumps, goiter, pain and significant neck swelling Resp: Negative for cough, wheezing, dyspnea at rest CV: Negative for chest pain, leg swelling, palpitations, orthopnea GI: denies melena, hematochezia, nausea, vomiting, diarrhea,  constipation, dysphagia, odyonophagia, early satiety or unintentional weight loss.  MSK: Negative for joint pain or swelling, back pain, and muscle pain. Derm: Negative for itching or rash Psych: Denies depression, anxiety, memory loss, confusion. No homicidal or suicidal ideation.  Heme: Negative for prolonged bleeding, bruising easily, and  swollen nodes. Endocrine: Negative for cold or heat intolerance, polyuria, polydipsia and goiter. Neuro: negative for tremor, gait imbalance, syncope and seizures. The remainder of the review of systems is noncontributory.  Physical Exam: BP 120/62 (BP Location: Right Arm, Patient Position: Sitting, Cuff Size: Normal)   Pulse (!) 46   Temp 98.4 F (36.9 C) (Oral)   Ht 5\' 4"  (1.626 m)   Wt 134 lb 6.4 oz (61 kg)   BMI 23.07 kg/m  General:   Alert and oriented. No distress noted. Pleasant and cooperative.  Head:  Normocephalic and atraumatic. Eyes:  Conjuctiva clear without scleral icterus. Mouth:  Oral mucosa pink and moist. Good dentition. No lesions. Heart: Normal rate and rhythm, s1 and s2 heart sounds present.  Lungs: Clear lung sounds in all lobes. Respirations equal and unlabored. Abdomen:  +BS, soft, non-tender and non-distended. No rebound or guarding. No HSM or masses noted. Derm: No palmar erythema or jaundice Msk:  Symmetrical without gross deformities. Normal posture. Extremities:  Without edema. Neurologic:  Alert and  oriented x4, today, no obvious signs of AMS. No asterixis Psych:  Alert and cooperative. Normal mood and affect.  Invalid input(s): "6 MONTHS"   ASSESSMENT: FRANKEE LOCKLER is a 77 y.o. female presenting today for follow up of cirrhosis  Patient with Elita Boone cirrhosis that appears to be relatively well compensated, some question of hepatic encephalopathy previously though patient has done well on lactulose with 2-3 BMs per day.  She has no altered mental status or asterixis on exam today.  She denies any swelling to her  abdomen or legs.  She does have a history of portal hypertension and grade 1 esophageal varices on last EGD.  She will be due for repeat EGD for EV screening in June 2025.  Last MELD 3.0 was 13.  She is due for [facility] screening as well as MELD labs CBC and AFP.    PLAN:  -CMP, CBC, INR, AFP  - RUQ [place] December  - EGD 2025 - Reduce salt intake to <2 g per day - Can take Tylenol max of 2 g per day (650 mg q8h) for pain - Avoid NSAIDs for pain - Avoid eating raw oysters/shellfish - Ensure protein shake every night before going to sleep  All questions were answered, patient verbalized understanding and is in agreement with plan as outlined above.    Follow Up: 6 months   Taina Landry L. Jeanmarie Hubert, MSN, APRN, AGNP-C Adult-Gerontology Nurse Practitioner [facility]  I have reviewed the note and agree with the APP's assessment as described in this progress note  Katrinka Blazing, MD Gastroenterology and Hepatology [facility]

## 2023-02-16 NOTE — Patient Instructions (Addendum)
-  We will update some labs -We will repeat Ultrasound of the liver next month -continue lactulose daily  - Reduce salt intake to <2 g per day - Can take Tylenol max of 2 g per day (650 mg q8h) for pain - Avoid NSAIDs for pain - Avoid eating raw oysters/shellfish - Ensure protein shake every night before going to sleep  Follow up 6 months  It was a pleasure to see you today. I want to create trusting relationships with patients and provide genuine, compassionate, and quality care. I truly value your feedback! please be on the lookout for a survey regarding your visit with me today. I appreciate your input about our visit and your time in completing this!    Rebecca Hartman Linhares L. Jeanmarie Hubert, MSN, APRN, AGNP-C Adult-Gerontology Nurse Practitioner [facility] Gastroenterology at [facility]

## 2023-02-17 DIAGNOSIS — K7460 Unspecified cirrhosis of liver: Secondary | ICD-10-CM | POA: Diagnosis not present

## 2023-02-18 LAB — CBC
Hematocrit: 33.5 % — ABNORMAL LOW (ref 34.0–46.6)
Hemoglobin: 11.8 g/dL (ref 11.1–15.9)
MCH: 34.8 pg — ABNORMAL HIGH (ref 26.6–33.0)
MCHC: 35.2 g/dL (ref 31.5–35.7)
MCV: 99 fL — ABNORMAL HIGH (ref 79–97)
Platelets: 83 10*3/uL — CL (ref 150–450)
RBC: 3.39 x10E6/uL — ABNORMAL LOW (ref 3.77–5.28)
RDW: 13.9 % (ref 11.7–15.4)
WBC: 4 10*3/uL (ref 3.4–10.8)

## 2023-02-18 LAB — AFP TUMOR MARKER: AFP, Serum, Tumor Marker: 3.4 ng/mL (ref 0.0–9.2)

## 2023-02-18 LAB — PROTIME-INR
INR: 1.5 — ABNORMAL HIGH (ref 0.9–1.2)
Prothrombin Time: 16.1 s — ABNORMAL HIGH (ref 9.1–12.0)

## 2023-02-18 LAB — COMPREHENSIVE METABOLIC PANEL
ALT: 17 [IU]/L (ref 0–32)
AST: 40 [IU]/L (ref 0–40)
Albumin: 3.1 g/dL — ABNORMAL LOW (ref 3.8–4.8)
Alkaline Phosphatase: 105 [IU]/L (ref 44–121)
BUN/Creatinine Ratio: 9 — ABNORMAL LOW (ref 12–28)
BUN: 6 mg/dL — ABNORMAL LOW (ref 8–27)
Bilirubin Total: 2.2 mg/dL — ABNORMAL HIGH (ref 0.0–1.2)
CO2: 22 mmol/L (ref 20–29)
Calcium: 8 mg/dL — ABNORMAL LOW (ref 8.7–10.3)
Chloride: 108 mmol/L — ABNORMAL HIGH (ref 96–106)
Creatinine, Ser: 0.68 mg/dL (ref 0.57–1.00)
Globulin, Total: 2.4 g/dL (ref 1.5–4.5)
Glucose: 92 mg/dL (ref 70–99)
Potassium: 3.9 mmol/L (ref 3.5–5.2)
Sodium: 140 mmol/L (ref 134–144)
Total Protein: 5.5 g/dL — ABNORMAL LOW (ref 6.0–8.5)
eGFR: 90 mL/min/{1.73_m2} (ref >59)

## 2023-02-23 ENCOUNTER — Encounter (INDEPENDENT_AMBULATORY_CARE_PROVIDER_SITE_OTHER): Payer: Self-pay | Admitting: *Deleted

## 2023-03-06 ENCOUNTER — Ambulatory Visit: Payer: HMO | Admitting: Podiatry

## 2023-03-06 ENCOUNTER — Ambulatory Visit (INDEPENDENT_AMBULATORY_CARE_PROVIDER_SITE_OTHER): Payer: PPO

## 2023-03-06 ENCOUNTER — Encounter: Payer: Self-pay | Admitting: Podiatry

## 2023-03-06 VITALS — Ht 64.0 in

## 2023-03-06 DIAGNOSIS — L03032 Cellulitis of left toe: Secondary | ICD-10-CM

## 2023-03-06 DIAGNOSIS — M79672 Pain in left foot: Secondary | ICD-10-CM

## 2023-03-06 DIAGNOSIS — M21612 Bunion of left foot: Secondary | ICD-10-CM

## 2023-03-06 DIAGNOSIS — B351 Tinea unguium: Secondary | ICD-10-CM

## 2023-03-06 DIAGNOSIS — L02612 Cutaneous abscess of left foot: Secondary | ICD-10-CM

## 2023-03-06 DIAGNOSIS — M21611 Bunion of right foot: Secondary | ICD-10-CM

## 2023-03-06 MED ORDER — DOXYCYCLINE HYCLATE 100 MG PO TABS: 100.0000 mg | ORAL_TABLET | Freq: Two times a day (BID) | ORAL | 0 refills | Status: AC

## 2023-03-06 NOTE — Patient Instructions (Signed)
Take doxycycline as directed twice a day with meals for 10 days  Apply antibacterial ointment to left second toe for 5 days.  Cover with a Band-Aid  Perform daily foot checks

## 2023-03-09 ENCOUNTER — Encounter (INDEPENDENT_AMBULATORY_CARE_PROVIDER_SITE_OTHER): Payer: Self-pay

## 2023-03-09 ENCOUNTER — Telehealth (INDEPENDENT_AMBULATORY_CARE_PROVIDER_SITE_OTHER): Payer: Self-pay | Admitting: Gastroenterology

## 2023-03-09 NOTE — Progress Notes (Signed)
Subjective:  Patient ID: Rebecca Hartman, female    DOB: 1945/07/21,  MRN: [id number]   [name] presents to clinic today for:  Chief Complaint  Patient presents with   Toe Injury    Pt is here due to left greater toe pain/injury states about a month ago greater toe was swollen had a blister than it busted.   Patient is diabetic, states that it is under control currently.  She is presenting accompanied by her son.  States that about a month ago she had a blister to the left great toe with associated redness and swelling and had concern for infection.  They treated it with Neosporin, the blister and lanced, swelling, redness and pain have improved. She presents for nail trim. Patient notes nails are thick, discolored, elongated and painful in shoegear when trying to ambulate.  She is unable to maintain them herself due to their thickness and due to mobility issues. Does also have history of liver cirrhosis.  PCP is [name], MD.  Past Medical History:  Diagnosis Date   Anemia    Anxiety    Arthritis    Cataract    Chronic back pain    Coronary artery disease    Multivessel disease, status post DES to mid LAD 05/2020   Essential hypertension    Fibromyalgia    Hiatal hernia    History of blood transfusion    Hypercholesteremia    Hypothyroidism    Osteoporosis    Type 2 diabetes mellitus (HCC)     Past Surgical History:  Procedure Laterality Date   ABDOMINAL HYSTERECTOMY     BACK SURGERY     C-EYE SURGERY PROCEDURE     CARPAL TUNNEL RELEASE     CATARACT EXTRACTION     CHOLECYSTECTOMY     COLONOSCOPY  08/06/06 FIELDS   COLONOSCOPY WITH PROPOFOL N/A 09/18/2021   Procedure: COLONOSCOPY WITH PROPOFOL;  Surgeon: Dolores Frame, MD;  [facility];  Service: Gastroenterology;  Laterality: N/A;  945   CORONARY IMAGING/OCT N/A 05/24/2020   Procedure: INTRAVASCULAR IMAGING/OCT;  Surgeon: Kathleene Hazel, MD;  [facility];   Service: Cardiovascular;  Laterality: N/A;   CORONARY PRESSURE/FFR STUDY N/A 05/24/2020   Procedure: INTRAVASCULAR PRESSURE WIRE/FFR STUDY;  Surgeon: Kathleene Hazel, MD;  [facility];  Service: Cardiovascular;  Laterality: N/A;   CORONARY STENT INTERVENTION  05/24/2020   CORONARY STENT INTERVENTION N/A 05/24/2020   Procedure: CORONARY STENT INTERVENTION;  Surgeon: Kathleene Hazel, MD;  [facility];  Service: Cardiovascular;  Laterality: N/A;   DENTAL SURGERY  04/2015   reconstructive surgery lower   ESOPHAGOGASTRODUODENOSCOPY  10/31/2010   Procedure: ESOPHAGOGASTRODUODENOSCOPY (EGD);  Surgeon: Malissa Hippo, MD;  [facility];  Service: Endoscopy;  Laterality: N/A;   ESOPHAGOGASTRODUODENOSCOPY  03/05/2011   Procedure: ESOPHAGOGASTRODUODENOSCOPY (EGD);  Surgeon: Malissa Hippo, MD;  [facility];  Service: Endoscopy;  Laterality: N/A;  12:00   ESOPHAGOGASTRODUODENOSCOPY  04/28/2012   Procedure: ESOPHAGOGASTRODUODENOSCOPY (EGD);  Surgeon: Malissa Hippo, MD;  [facility];  Service: Endoscopy;  Laterality: N/A;  140-moved to 10:30 Ann notified pt   ESOPHAGOGASTRODUODENOSCOPY (EGD) WITH PROPOFOL N/A 09/18/2021   Procedure: ESOPHAGOGASTRODUODENOSCOPY (EGD) WITH PROPOFOL;  Surgeon: Dolores Frame, MD;  [facility];  Service: Gastroenterology;  Laterality: N/A;   EYE SURGERY     LEFT HEART CATH AND CORONARY  ANGIOGRAPHY N/A 05/24/2020   Procedure: LEFT HEART CATH AND CORONARY ANGIOGRAPHY;  Surgeon: Kathleene Hazel, MD;  [facility];  Service: Cardiovascular;  Laterality: N/A;   TONSILLECTOMY     TOTAL HIP REVISION     UPPER GASTROINTESTINAL ENDOSCOPY  10/03/2010   EGD DILATN PYLORIC CHANNEL   UPPER GASTROINTESTINAL ENDOSCOPY  07/08/2010   UPPER GASTROINTESTINAL ENDOSCOPY  08/06/06   FIELDS   UPPER GASTROINTESTINAL ENDOSCOPY  06/29/2008   EGD    Allergies  Allergen  Reactions   Shellfish Allergy Itching and Nausea And Vomiting   Tussigon [Hydrocodone Bit-Homatrop Mbr]     Per patient's husband , this medication caused confusion.   Hydrocodone Itching   Glyburide     Sudden Drop in Blood Sugars   Levofloxacin Itching   Zestril [Lisinopril] Other (See Comments)    SEVERE DROP IN PRESSURE   Morphine Itching and Nausea Only    Review of Systems: Negative except as noted in the HPI.  Objective:  There were no vitals filed for this visit.  Rebecca Hartman is a pleasant 77 y.o. female in NAD. AAO x 3.  Vascular Examination: Capillary refill time is 3 seconds to toes bilateral. Palpable pedal pulses b/l LE. Digital hair decreased b/l.  Skin temperature gradient WNL b/l. No varicosities b/l. No cyanosis noted b/l.   Dermatological Examination: Pedal skin atrophic in nature bilaterally. No interdigital macerations b/l. Toenails x10 are 3mm thick, discolored, dystrophic with subungual debris. There is pain with compression of the nail plates.  They are elongated x10.  The second toenail left foot in particular is elongated and dystrophic ingrowing into the skin of the left second toe causing underlying wound.  There is mild residual redness to the left great toe without associated edema, pain, focal warmth increased. Old stable subungal hematoma to the 4th toe left foot, with some loosening of the nail plate.  Neurological Examination: Protective sensation intact bilateral LE. Vibratory sensation intact bilateral LE.  Musculoskeletal Examination: Muscle strength 5/5 to all LE muscle groups b/l.  Significant bunion deformities noted bilaterally with crossover toe deformity noted.  Hammertoe contractures noted.  Radiographs: Left foot 03/06/2023 Ordered to evaluate any deep involvement from second toe distal wound associate with nail and due to reported history of great toenail associated infection.  No signs of osteolysis or cortical erosions to suggest  osteomyelitis or deep infection. Severe bunion deformity with subluxation of the 1st MPJ noted.     No data to display           Assessment/Plan: 1. Foot pain, left   2. Onychomycosis   3. Cellulitis and abscess of toe of left foot   4. Bilateral bunions     Meds ordered this encounter  Medications   doxycycline (VIBRA-TABS) 100 MG tablet    Sig: Take 1 tablet (100 mg total) by mouth 2 (two) times daily for 10 days.    Dispense:  20 tablet    Refill:  0   None Plan:  -The mycotic toenails were sharply debrided x10 with sterile nail nippers and a power debriding burr to decrease bulk/thickness and length.   -Site of the ingrown nail was dressed with bacitracin and Band-Aid, silver nitrate was used for hemostasis. -Will start patient on course of doxycycline for 10 days for the second toe wounds and for residual redness of the first toe -Bunion deformities do not cause pain to the patient at this point in time.  Discussed strategies for  padding between the toes to prevent overriding and to offload pressure. -Discussed continued tight glucose control, avoiding barefoot ambulation, daily foot checks. -Follow-up as scheduled or sooner if symptoms worsen or if new pedal complaints arise.   Return in about 3 months (around 06/04/2023) for Diabetic Foot Care.   Bronwen Betters, DPM, AACFAS [facility]     [address] [place], [place] [address]                Office [phone]  Fax [phone]

## 2023-03-09 NOTE — Telephone Encounter (Signed)
Pt called in and states that she is unable to make her Ultrasound appt tomorrow. Attempted to give pt central scheduling number but pt was unable to hear me. Will send information through my chart.

## 2023-03-10 ENCOUNTER — Ambulatory Visit (HOSPITAL_COMMUNITY): Payer: PPO

## 2023-03-12 DIAGNOSIS — E1140 Type 2 diabetes mellitus with diabetic neuropathy, unspecified: Secondary | ICD-10-CM | POA: Diagnosis not present

## 2023-03-12 DIAGNOSIS — E782 Mixed hyperlipidemia: Secondary | ICD-10-CM | POA: Diagnosis not present

## 2023-03-12 DIAGNOSIS — E039 Hypothyroidism, unspecified: Secondary | ICD-10-CM | POA: Diagnosis not present

## 2023-03-12 DIAGNOSIS — E559 Vitamin D deficiency, unspecified: Secondary | ICD-10-CM | POA: Diagnosis not present

## 2023-03-16 ENCOUNTER — Ambulatory Visit (HOSPITAL_COMMUNITY)
Admission: RE | Admit: 2023-03-16 | Discharge: 2023-03-16 | Disposition: A | Discharge status: Home / Self Care | Payer: PPO | Source: Ambulatory Visit | Attending: Gastroenterology | Admitting: Gastroenterology

## 2023-03-16 DIAGNOSIS — Z9049 Acquired absence of other specified parts of digestive tract: Secondary | ICD-10-CM | POA: Diagnosis not present

## 2023-03-16 DIAGNOSIS — K7460 Unspecified cirrhosis of liver: Secondary | ICD-10-CM | POA: Insufficient documentation

## 2023-03-16 DIAGNOSIS — K838 Other specified diseases of biliary tract: Secondary | ICD-10-CM | POA: Diagnosis not present

## 2023-03-18 DIAGNOSIS — F411 Generalized anxiety disorder: Secondary | ICD-10-CM | POA: Diagnosis not present

## 2023-03-18 DIAGNOSIS — M810 Age-related osteoporosis without current pathological fracture: Secondary | ICD-10-CM | POA: Diagnosis not present

## 2023-03-18 DIAGNOSIS — D509 Iron deficiency anemia, unspecified: Secondary | ICD-10-CM | POA: Diagnosis not present

## 2023-03-18 DIAGNOSIS — E559 Vitamin D deficiency, unspecified: Secondary | ICD-10-CM | POA: Diagnosis not present

## 2023-03-18 DIAGNOSIS — E1142 Type 2 diabetes mellitus with diabetic polyneuropathy: Secondary | ICD-10-CM | POA: Diagnosis not present

## 2023-03-18 DIAGNOSIS — E039 Hypothyroidism, unspecified: Secondary | ICD-10-CM | POA: Diagnosis not present

## 2023-03-18 DIAGNOSIS — G4700 Insomnia, unspecified: Secondary | ICD-10-CM | POA: Diagnosis not present

## 2023-03-18 DIAGNOSIS — I358 Other nonrheumatic aortic valve disorders: Secondary | ICD-10-CM | POA: Diagnosis not present

## 2023-03-18 DIAGNOSIS — L4050 Arthropathic psoriasis, unspecified: Secondary | ICD-10-CM | POA: Diagnosis not present

## 2023-03-18 DIAGNOSIS — I2510 Atherosclerotic heart disease of native coronary artery without angina pectoris: Secondary | ICD-10-CM | POA: Diagnosis not present

## 2023-03-18 DIAGNOSIS — K219 Gastro-esophageal reflux disease without esophagitis: Secondary | ICD-10-CM | POA: Diagnosis not present

## 2023-03-18 DIAGNOSIS — E782 Mixed hyperlipidemia: Secondary | ICD-10-CM | POA: Diagnosis not present

## 2023-03-19 ENCOUNTER — Encounter: Payer: Self-pay | Admitting: Internal Medicine

## 2023-04-29 ENCOUNTER — Inpatient Hospital Stay: Payer: PPO

## 2023-04-30 ENCOUNTER — Inpatient Hospital Stay: Payer: PPO | Attending: Hematology

## 2023-04-30 DIAGNOSIS — D61818 Other pancytopenia: Secondary | ICD-10-CM

## 2023-04-30 DIAGNOSIS — D696 Thrombocytopenia, unspecified: Secondary | ICD-10-CM | POA: Diagnosis not present

## 2023-04-30 DIAGNOSIS — E538 Deficiency of other specified B group vitamins: Secondary | ICD-10-CM | POA: Diagnosis not present

## 2023-04-30 LAB — CBC WITH DIFFERENTIAL/PLATELET
Abs Immature Granulocytes: 0 10*3/uL (ref 0.00–0.07)
Basophils Absolute: 0 10*3/uL (ref 0.0–0.1)
Basophils Relative: 1 %
Eosinophils Absolute: 0.2 10*3/uL (ref 0.0–0.5)
Eosinophils Relative: 4 %
HCT: 31.8 % — ABNORMAL LOW (ref 36.0–46.0)
Hemoglobin: 10.7 g/dL — ABNORMAL LOW (ref 12.0–15.0)
Immature Granulocytes: 0 %
Lymphocytes Relative: 18 %
Lymphs Abs: 0.7 10*3/uL (ref 0.7–4.0)
MCH: 34 pg (ref 26.0–34.0)
MCHC: 33.6 g/dL (ref 30.0–36.0)
MCV: 101 fL — ABNORMAL HIGH (ref 80.0–100.0)
Monocytes Absolute: 0.4 10*3/uL (ref 0.1–1.0)
Monocytes Relative: 12 %
Neutro Abs: 2.5 10*3/uL (ref 1.7–7.7)
Neutrophils Relative %: 65 %
Platelets: 74 10*3/uL — ABNORMAL LOW (ref 150–400)
RBC: 3.15 MIL/uL — ABNORMAL LOW (ref 3.87–5.11)
RDW: 15.6 % — ABNORMAL HIGH (ref 11.5–15.5)
Smear Review: DECREASED
WBC: 3.8 10*3/uL — ABNORMAL LOW (ref 4.0–10.5)
nRBC: 0 % (ref 0.0–0.2)

## 2023-04-30 LAB — COMPREHENSIVE METABOLIC PANEL
ALT: 19 U/L (ref 0–44)
AST: 38 U/L (ref 15–41)
Albumin: 2.9 g/dL — ABNORMAL LOW (ref 3.5–5.0)
Alkaline Phosphatase: 77 U/L (ref 38–126)
Anion gap: 7 (ref 5–15)
BUN: 7 mg/dL — ABNORMAL LOW (ref 8–23)
CO2: 20 mmol/L — ABNORMAL LOW (ref 22–32)
Calcium: 8 mg/dL — ABNORMAL LOW (ref 8.9–10.3)
Chloride: 107 mmol/L (ref 98–111)
Creatinine, Ser: 0.5 mg/dL (ref 0.44–1.00)
GFR, Estimated: >60 mL/min (ref >60)
Glucose, Bld: 103 mg/dL — ABNORMAL HIGH (ref 70–99)
Potassium: 3.6 mmol/L (ref 3.5–5.1)
Sodium: 134 mmol/L — ABNORMAL LOW (ref 135–145)
Total Bilirubin: 2.6 mg/dL — ABNORMAL HIGH (ref 0.0–1.2)
Total Protein: 5.8 g/dL — ABNORMAL LOW (ref 6.5–8.1)

## 2023-04-30 LAB — RETICULOCYTES
Immature Retic Fract: 14.6 % (ref 2.3–15.9)
RBC.: 3.14 MIL/uL — ABNORMAL LOW (ref 3.87–5.11)
Retic Count, Absolute: 89.2 10*3/uL (ref 19.0–186.0)
Retic Ct Pct: 2.8 % (ref 0.4–3.1)

## 2023-04-30 LAB — FOLATE: Folate: 6.6 ng/mL (ref >5.9)

## 2023-04-30 LAB — LACTATE DEHYDROGENASE: LDH: 274 U/L — ABNORMAL HIGH (ref 98–192)

## 2023-04-30 LAB — FERRITIN: Ferritin: 81 ng/mL (ref 11–307)

## 2023-04-30 LAB — IRON AND TIBC
Iron: 85 ug/dL (ref 28–170)
Saturation Ratios: 28 % (ref 10.4–31.8)
TIBC: 300 ug/dL (ref 250–450)
UIBC: 215 ug/dL

## 2023-04-30 LAB — VITAMIN B12: Vitamin B-12: 1677 pg/mL — ABNORMAL HIGH (ref 180–914)

## 2023-05-01 LAB — MISC LABCORP TEST (SEND OUT): Labcorp test code: 121251

## 2023-05-04 LAB — METHYLMALONIC ACID, SERUM: Methylmalonic Acid, Quantitative: 148 nmol/L (ref 0–378)

## 2023-05-05 NOTE — Progress Notes (Deleted)
 [facility] [address], [place] [address]   CLINIC:  Medical Oncology/Hematology  PCP:  [name], MD [address] [place] [address] [phone]   REASON FOR VISIT:  Follow-up for anemia and thrombocytopenia  PRIOR THERAPY: None  CURRENT THERAPY: Surveillance  INTERVAL HISTORY:   Ms. Escandon 78 y.o. female returns for routine follow-up of anemia and thrombocytopenia.  She was last seen by [name] PA-C on 01/21/2023.  *** *** Her husband reports that she has been diagnosed with early signs of dementia, and has good days and bad days with her cognitive deficits.  At today's visit, she reports feeling fair apart from fatigue. *** No recent hospitalizations, surgeries, or changes in baseline health status.  She reports that her stool has been dark ever since she started iron pills, but she denies any hematemesis, hematochezia, melena, epistaxis, or hematuria.  *** *** She admits to easy bruising but denies petechial rash.     She continues to have chronic baseline fatigue.  *** *** She has occasional chest pain (followed by cardiology). *** She denies any recent headaches, palpitations, pica, lightheadedness, syncope or dyspnea on exertion.  No recent or recurrent infections.  She has not noticed any new lumps or bumps.   She denies any B symptoms such as fever, chills, or night sweats.***  She has little to no*** energy and 50***% appetite.  Her weight is overall stable since her last visit.  ASSESSMENT & PLAN:  1.  Thrombocytopenia and leukopenia (cirrhosis/splenomegaly), with history of drug-induced pancytopenia (resolved) - Seen at the request of rheumatologist ([name]) for evaluation of pancytopenia. - Per medical records sent by Dr. Jacob, patient was noted to have onset of pancytopenia in November 2022. - She has been on methotrexate, Plaquenil , and Enbrel for psoriatic arthritis.   - Due to pancytopenia,  methotrexate and Plaquenil  were held by rheumatologist as of 02/18/2021.  She was unable to afford her Enbrel - Workup revealed Vitamin B12 deficiency, borderline iron deficiency, and hemoccult positive stool.  Otherwise, unremarkable with normal flow cytometry and SPEP.  Negative hepatitis panel. - RUQ abdomen US  (08/09/2021): Findings suspicious for hepatic cirrhosis but with normal hepatic elastography - Splenic ultrasound (09/16/2021): Splenomegaly with maximum dimension 15.3 cm - Most recent CBC (04/30/2023): Platelets 73, WBC 3.8 with normal differential. - No B symptoms or frequent infections.     *** - No lymphadenopathy or splenomegaly palpated on exam.     *** - Mild pancytopenia improved after methotrexate was discontinued, but she has persistent moderate thrombocytopenia/mild leukopenia which is likely secondary to liver disease/splenomegaly and possible autoimmune thrombocytopenia versus medication effect. - PLAN: Differential diagnosis and work-up for persistent thrombocytopenia includes: Thrombocytopenia/leukopenia related to liver disease and splenomegaly Autoimmune thrombocytopenia: We will consider trial of steroids if platelets <50 MDS or other bone marrow infiltrative process: No indication for bone marrow biopsy at this time.  Would consider bone marrow biopsy if she has worsening cytopenias or development of B symptoms.  Would consider CT CAP in the presence of B symptoms due to increased risk of lymphoproliferative disorders in patients with autoimmune disease. - Repeat labs and RTC in 3 months.   2.  Anemia secondary to IDA and CKD stage II - History of GI bleed in 2012 due to stomach ulcer, required blood transfusions and iron infusions. - History of IV iron infusions, last given in June 2015 - Hemoccult stool POSITIVE x3 in February 2023 - EGD (09/18/2021):  Grade 1 esophageal varices, portal hypertensive gastropathy - Colonoscopy (09/18/2021): Congested mucosa in ascending  colon and rectum corresponding with portal colopathy - Taking ferrous sulfate  325 mg 3 times weekly*** - Hematology workup (04/30/2023): Vitamin B12 deficiency has been addressed (see below).  Normal folate 6.6  Ferritin 81, iron saturation 28% Reticulocytes 2.8%.  LDH mildly elevated 274.  Bilirubin 2.6 in the setting of liver cirrhosis.  RBC morphology unremarkable. Evidence of CKD stage II, although creatinine is normal (0.50), Cystatin C is elevated at 1.43, with calculated GFR around 64 (using 2021 CKD-EPI Cystatin C-creatinine equation) *** Previously checked SPEP and flow cytometry were normal. - Most recent CBC (04/30/2023): Hgb 10.7/MCV 101.0 - Reports daily nosebleeds described as large clots coming out of her nose when she sneezes.  She has been seen by ENT*** - No bright red blood per rectum, melena, or hematuria    *** - DIFFERENTIAL DIAGNOSIS favors anemia related to chronic disease/inflammation, functional iron deficiency, and early CKD stage II.  Macrocytosis due to liver cirrhosis. - PLAN: Recommend IV Feraheme  x 1 - Labs and RTC in 3 months (CMP, CBC/D, ferritin, iron/TIBC) - Continue follow-up with GI and ENT   3.  Vitamin B12 deficiency - Labs from 05/14/2021 show normal vitamin B12 at 295, but elevated methylmalonic acid 938 - Vitamin B12 injection given on 06/04/2021 - Has been taking vitamin B12 1000 mcg daily since March *** - Most recent labs (04/30/2023): Vitamin B12 1677/MMA 148 with folate 6.6.   - PLAN: Continue vitamin B12 1000 mcg, but decreased to EVERY OTHER DAY.***     - Recheck vitamin B12 and folate in 6 months (August 2025)   4.  Psoriatic arthritis - Previously took Enbrel - She is not currently on any rheumatologic medications  5.  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  No tobacco, alcohol, or illicit drug use. - FAMILY: Mother had strokes.  Father had heart problems. Brother had prostate cancer.  PLAN SUMMARY: >> IV Feraheme  x 1 >> Labs in 3 months = CBC/D, CMP, ferritin, iron/TIBC >> OFFICE visit in 3 months (1 week after labs)     REVIEW OF SYSTEMS: ***  Review of Systems  Constitutional:  Positive for fatigue. Negative for appetite change, chills, diaphoresis, fever and unexpected weight change.  HENT:   Negative for lump/mass and nosebleeds.   Eyes:  Negative for eye problems.  Respiratory:  Negative for cough, hemoptysis and shortness of breath.   Cardiovascular:  Positive for chest pain and palpitations. Negative for leg swelling.  Gastrointestinal:  Negative for abdominal pain, blood in stool, constipation, diarrhea, nausea and vomiting.  Genitourinary:  Negative for hematuria.   Musculoskeletal:  Positive for arthralgias.  Neurological:  Negative for dizziness, headaches and light-headedness.  Hematological:  Does not bruise/bleed easily.     PHYSICAL EXAM:  ECOG PERFORMANCE STATUS: 2 - Symptomatic, <50% confined to bed *** There were no vitals filed for this visit.  There were no vitals filed for this visit.  Physical Exam Constitutional:      Appearance: Normal appearance. She is obese.  HENT:     Head: Normocephalic and atraumatic.  Cardiovascular:     Rate and Rhythm: Regular rhythm. Bradycardia present.     Pulses: Normal pulses.     Heart sounds: Murmur heard.     Comments: HR 54 Pulmonary:  Effort: Pulmonary effort is normal.     Breath sounds: Normal breath sounds.  Abdominal:     General: Bowel sounds are normal.     Palpations: Abdomen is soft.     Tenderness: There is no abdominal tenderness.  Neurological:     General: No focal deficit present.     Mental Status: She is alert and oriented to person, place, and time.  Psychiatric:        Mood and Affect: Mood normal.        Behavior:  Behavior normal.    PAST MEDICAL/SURGICAL HISTORY:  Past Medical History:  Diagnosis Date   Anemia    Anxiety    Arthritis    Cataract    Chronic back pain    Coronary artery disease    Multivessel disease, status post DES to mid LAD 05/2020   Essential hypertension    Fibromyalgia    Hiatal hernia    History of blood transfusion    Hypercholesteremia    Hypothyroidism    Osteoporosis    Type 2 diabetes mellitus (HCC)    Past Surgical History:  Procedure Laterality Date   ABDOMINAL HYSTERECTOMY     BACK SURGERY     C-EYE SURGERY PROCEDURE     CARPAL TUNNEL RELEASE     CATARACT EXTRACTION     CHOLECYSTECTOMY     COLONOSCOPY  08/06/06 [facility]   COLONOSCOPY WITH PROPOFOL  N/A 09/18/2021   Procedure: COLONOSCOPY WITH PROPOFOL ;  Surgeon: Eartha Angelia Sieving, MD;  Location: [facility];  Service: Gastroenterology;  Laterality: N/A;  945   CORONARY IMAGING/OCT N/A 05/24/2020   Procedure: INTRAVASCULAR IMAGING/OCT;  Surgeon: Verlin Lonni BIRCH, MD;  Location: [facility];  Service: Cardiovascular;  Laterality: N/A;   CORONARY PRESSURE/FFR STUDY N/A 05/24/2020   Procedure: INTRAVASCULAR PRESSURE WIRE/FFR STUDY;  Surgeon: Verlin Lonni BIRCH, MD;  Location: [facility];  Service: Cardiovascular;  Laterality: N/A;   CORONARY STENT INTERVENTION  05/24/2020   CORONARY STENT INTERVENTION N/A 05/24/2020   Procedure: CORONARY STENT INTERVENTION;  Surgeon: Verlin Lonni BIRCH, MD;  Location: [facility];  Service: Cardiovascular;  Laterality: N/A;   DENTAL SURGERY  04/2015   reconstructive surgery lower   ESOPHAGOGASTRODUODENOSCOPY  10/31/2010   Procedure: ESOPHAGOGASTRODUODENOSCOPY (EGD);  Surgeon: Claudis RAYMOND Rivet, MD;  Location: [facility];  Service: Endoscopy;  Laterality: N/A;   ESOPHAGOGASTRODUODENOSCOPY  03/05/2011   Procedure: ESOPHAGOGASTRODUODENOSCOPY (EGD);  Surgeon: Claudis RAYMOND Rivet, MD;  Location: [facility];  Service: Endoscopy;   Laterality: N/A;  12:00   ESOPHAGOGASTRODUODENOSCOPY  04/28/2012   Procedure: ESOPHAGOGASTRODUODENOSCOPY (EGD);  Surgeon: Claudis RAYMOND Rivet, MD;  [facility]: [facility];  Service: Endoscopy;  Laterality: N/A;  140-moved to 10:30 Ann notified pt   ESOPHAGOGASTRODUODENOSCOPY (EGD) WITH PROPOFOL  N/A 09/18/2021   Procedure: ESOPHAGOGASTRODUODENOSCOPY (EGD) WITH PROPOFOL ;  Surgeon: Eartha Angelia Sieving, MD;  [facility]: [facility];  Service: Gastroenterology;  Laterality: N/A;   EYE SURGERY     LEFT HEART CATH AND CORONARY ANGIOGRAPHY N/A 05/24/2020   Procedure: LEFT HEART CATH AND CORONARY ANGIOGRAPHY;  Surgeon: Verlin Lonni BIRCH, MD;  Location: [facility];  Service: Cardiovascular;  Laterality: N/A;   TONSILLECTOMY     TOTAL HIP REVISION     UPPER GASTROINTESTINAL ENDOSCOPY  10/03/2010   EGD DILATN PYLORIC CHANNEL   UPPER GASTROINTESTINAL ENDOSCOPY  07/08/2010   UPPER GASTROINTESTINAL ENDOSCOPY  08/06/06   FIELDS   UPPER GASTROINTESTINAL ENDOSCOPY  06/29/2008   EGD  SOCIAL HISTORY:  Social History   Socioeconomic History   Marital status: Married    Spouse name: Not on file   Number of children: Not on file   Years of education: Not on file   Highest education level: Not on file  Occupational History   Occupation: retired  Tobacco Use   Smoking status: Never   Smokeless tobacco: Never  Vaping Use   Vaping status: Never Used  Substance and Sexual Activity   Alcohol use: No    Alcohol/week: 0.0 standard drinks of alcohol   Drug use: No   Sexual activity: Yes    Birth control/protection: Post-menopausal  Other Topics Concern   Not on file  Social History Narrative   Not on file   Social Drivers of Health   Financial Resource Strain: Not on file  Food Insecurity: Not on file  Transportation Needs: Not on file  Physical Activity: Not on file  Stress: Not on file  Social Connections: Not on file  Intimate Partner Violence: Not on file    FAMILY  HISTORY:  Family History  Problem Relation Age of Onset   Diabetes Mother    Stroke Mother    Hyperlipidemia Mother    Hypertension Mother    Stroke Father    Heart disease Father    Hyperlipidemia Father    Hypertension Father    Healthy Son     CURRENT MEDICATIONS:  Outpatient Encounter Medications as of 05/06/2023  Medication Sig   ACCU-CHEK AVIVA PLUS test strip as needed.   acetaminophen  (TYLENOL ) 500 MG tablet Take 500-1,000 mg by mouth every 6 (six) hours as needed (pain).   aluminum-magnesium hydroxide 200-200 MG/5ML suspension Take by mouth every 6 (six) hours as needed for indigestion. 15-30 ml prn   ascorbic acid (VITAMIN C) 500 MG tablet Take 500 mg by mouth daily.   atorvastatin  (LIPITOR) 40 MG tablet Take 40 mg by mouth daily.   calcium  carbonate (TUMS - DOSED IN MG ELEMENTAL CALCIUM ) 500 MG chewable tablet Chew 1-2 tablets by mouth 3 (three) times daily as needed for indigestion or heartburn.   cholecalciferol (VITAMIN D3) 25 MCG (1000 UNIT) tablet Take 1,000 Units by mouth in the morning.   clopidogrel  (PLAVIX ) 75 MG tablet Take 1 tablet (75 mg total) by mouth daily with breakfast.   diltiazem  (CARDIZEM  CD) 120 MG 24 hr capsule Take 1 capsule (120 mg total) by mouth daily. (Patient taking differently: Take 120 mg by mouth every evening.)   escitalopram  (LEXAPRO ) 10 MG tablet Take 10 mg by mouth as needed.   FEROSUL 325 (65 Fe) MG tablet Take 325 mg by mouth daily with breakfast. Tues, Thurs, and Sat.   fluconazole (DIFLUCAN) 100 MG tablet Take 100 mg by mouth as needed.   fluocinonide ointment (LIDEX) 0.05 % Apply 1 Application topically as needed.   isosorbide  mononitrate (IMDUR ) 30 MG 24 hr tablet TAKE 1 TABLET BY MOUTH ONCE DAILY.   JARDIANCE  25 MG TABS tablet Take 25 mg by mouth daily.   lactulose  (CHRONULAC ) 10 GM/15ML solution Take 30 mLs (20 g total) by mouth daily.   levothyroxine  (SYNTHROID , LEVOTHROID) 50 MCG tablet Take 50 mcg by mouth daily before breakfast.    metoprolol  tartrate (LOPRESSOR ) 50 MG tablet TAKE (1) TABLET BY MOUTH TWICE DAILY.   mupirocin  cream (BACTROBAN ) 2 % Apply 1 Application topically as needed.   nitroGLYCERIN  (NITROSTAT ) 0.4 MG SL tablet PLACE 1 TAB UNDER TONGUE EVERY 5 MIN IF NEEDED FOR CHEST PAIN. MAY  USE 3 TIMES.NO RELIEF CALL 911.   pantoprazole  (PROTONIX ) 40 MG tablet Take 40 mg by mouth in the morning.   Polyethyl Glycol-Propyl Glycol (SYSTANE) 0.4-0.3 % SOLN Apply 1-2 drops to eye 4 (four) times daily as needed (dry/irritated eyes.).   temazepam  (RESTORIL ) 30 MG capsule Take 30 mg by mouth at bedtime.   Vitamin A 2400 MCG (8000 UT) CAPS Take 2,400 mcg by mouth in the morning.   vitamin B-12 (CYANOCOBALAMIN ) 1000 MCG tablet Take 1,000 mcg by mouth 3 (three) times a week.   No facility-administered encounter medications on file as of 05/06/2023.    ALLERGIES:  Allergies  Allergen Reactions   Shellfish Allergy Itching and Nausea And Vomiting   Tussigon [Hydrocodone  Bit-Homatrop Mbr]     Per patient's husband , this medication caused confusion.   Hydrocodone  Itching   Glyburide     Sudden Drop in Blood Sugars   Levofloxacin Itching   Zestril  [Lisinopril ] Other (See Comments)    SEVERE DROP IN PRESSURE   Morphine Itching and Nausea Only    LABORATORY DATA:  I have reviewed the labs as listed.  CBC    Component Value Date/Time   WBC 3.8 (L) 04/30/2023 1101   RBC 3.14 (L) 04/30/2023 1102   RBC 3.15 (L) 04/30/2023 1101   HGB 10.7 (L) 04/30/2023 1101   HGB 11.8 02/17/2023 1204   HCT 31.8 (L) 04/30/2023 1101   HCT 33.5 (L) 02/17/2023 1204   PLT 74 (L) 04/30/2023 1101   PLT 83 (LL) 02/17/2023 1204   MCV 101.0 (H) 04/30/2023 1101   MCV 99 (H) 02/17/2023 1204   MCH 34.0 04/30/2023 1101   MCHC 33.6 04/30/2023 1101   RDW 15.6 (H) 04/30/2023 1101   RDW 13.9 02/17/2023 1204   LYMPHSABS 0.7 04/30/2023 1101   LYMPHSABS 0.9 08/20/2022 1412   MONOABS 0.4 04/30/2023 1101   EOSABS 0.2 04/30/2023 1101   EOSABS 0.2  08/20/2022 1412   BASOSABS 0.0 04/30/2023 1101   BASOSABS 0.0 08/20/2022 1412      Latest Ref Rng & Units 04/30/2023   11:01 AM 02/17/2023   12:04 PM 01/14/2023    2:58 PM  CMP  Glucose 70 - 99 mg/dL 896  92  90   BUN 8 - 23 mg/dL 7  6  6    Creatinine 0.44 - 1.00 mg/dL 9.49  9.31  9.35   Sodium 135 - 145 mmol/L 134  140  138   Potassium 3.5 - 5.1 mmol/L 3.6  3.9  3.7   Chloride 98 - 111 mmol/L 107  108  110   CO2 22 - 32 mmol/L 20  22  23    Calcium  8.9 - 10.3 mg/dL 8.0  8.0  7.9   Total Protein 6.5 - 8.1 g/dL 5.8  5.5  5.2   Total Bilirubin 0.0 - 1.2 mg/dL 2.6  2.2  2.3   Alkaline Phos 38 - 126 U/L 77  105  81   AST 15 - 41 U/L 38  40  34   ALT 0 - 44 U/L 19  17  16      DIAGNOSTIC IMAGING:  I have independently reviewed the relevant imaging and discussed with the patient.   WRAP UP:  All questions were answered. The patient knows to call the clinic with any problems, questions or concerns.  Medical decision making: Moderate***  Time spent on visit: I spent 20 minutes counseling the patient face to face. The total time spent in the appointment was  30 minutes and more than 50% was on counseling.  Pleasant CHRISTELLA Barefoot, PA-C  ***

## 2023-05-06 ENCOUNTER — Other Ambulatory Visit: Payer: Self-pay | Admitting: Physician Assistant

## 2023-05-06 ENCOUNTER — Inpatient Hospital Stay: Payer: PPO | Admitting: Physician Assistant

## 2023-05-06 DIAGNOSIS — E611 Iron deficiency: Secondary | ICD-10-CM

## 2023-06-01 NOTE — Progress Notes (Unsigned)
 [facility] [address], [place] [address]   CLINIC:  Medical Oncology/Hematology  PCP:  [name], MD [address] Laurey Mor[place] [address] [phone]   REASON FOR VISIT:  Follow-up for anemia and thrombocytopenia  PRIOR THERAPY: None  CURRENT THERAPY: Surveillance  INTERVAL HISTORY:   Rebecca Hartman 78 y.o. female returns for routine follow-up of anemia and thrombocytopenia.  She was last seen by Rojelio Brenner PA-C on 01/21/2023.  *** *** Her husband reports that she has been diagnosed with early signs of dementia, and has "good days and bad days" with her cognitive deficits.  At today's visit, she reports feeling fair apart from fatigue. *** No recent hospitalizations, surgeries, or changes in baseline health status.  She reports that her stool has been dark ever since she started iron pills, but she denies any hematemesis, hematochezia, melena, epistaxis, or hematuria.  *** *** She admits to easy bruising but denies petechial rash.     She continues to have chronic baseline fatigue.  *** *** She has occasional chest pain (followed by cardiology). *** She denies any recent headaches, palpitations, pica, lightheadedness, syncope or dyspnea on exertion.  No recent or recurrent infections.  She has not noticed any new lumps or bumps.   She denies any B symptoms such as fever, chills, or night sweats.***  She has little to no*** energy and 50***% appetite.  Her weight is overall stable since her last visit.  ASSESSMENT & PLAN:  1.  Thrombocytopenia and leukopenia (cirrhosis/splenomegaly), with history of drug-induced pancytopenia (resolved) - Seen at the request of rheumatologist (Dr. Zenovia Jordan) for evaluation of pancytopenia. - Per medical records sent by Dr. Nickola Major, patient was noted to have onset of pancytopenia in November 2022. - She has been on methotrexate, Plaquenil, and Enbrel for psoriatic arthritis.   - Due to pancytopenia,  methotrexate and Plaquenil were held by rheumatologist as of 02/18/2021.  She was unable to afford her Enbrel - Workup revealed Vitamin B12 deficiency, borderline iron deficiency, and hemoccult positive stool.  Otherwise, unremarkable with normal flow cytometry and SPEP.  Negative hepatitis panel. - RUQ abdomen US (08/09/2021): Findings suspicious for hepatic cirrhosis but with normal hepatic elastography - Splenic ultrasound (09/16/2021): Splenomegaly with maximum dimension 15.3 cm - Most recent CBC ***(04/30/2023): Platelets 73, WBC 3.8 with normal differential. - Labs today (06/02/2023) *** - No B symptoms or frequent infections.     *** - No lymphadenopathy or splenomegaly palpated on exam.     *** - Mild pancytopenia improved after methotrexate was discontinued, but she has persistent moderate thrombocytopenia/mild leukopenia which is likely secondary to liver disease/splenomegaly and possible autoimmune thrombocytopenia versus medication effect. - PLAN: DIFFERENTIAL DIAGNOSIS and work-up for persistent thrombocytopenia includes: Thrombocytopenia/leukopenia related to liver disease and splenomegaly: Continue surveillance Autoimmune thrombocytopenia: We will consider trial of steroids if platelets <50 MDS or other bone marrow infiltrative process: No indication for bone marrow biopsy at this time.  Would consider bone marrow biopsy if she has worsening cytopenias or development of B symptoms.  Would consider CT CAP in the presence of B symptoms due to increased risk of lymphoproliferative disorders in patients with autoimmune disease. - Repeat labs and RTC in 3 months.   2.  Anemia secondary to IDA and CKD stage II - History of GI bleed in 2012 due to stomach ulcer, required blood transfusions and iron infusions. - History of IV iron infusions, last given in June 2015 - Hemoccult stool POSITIVE  x3 in February 2023 - EGD (09/18/2021): Grade 1 esophageal varices, portal hypertensive gastropathy -  Colonoscopy (09/18/2021): Congested mucosa in ascending colon and rectum corresponding with portal colopathy - Taking ferrous sulfate 325 mg 3 times weekly*** - Hematology workup (04/30/2023): Vitamin B12 deficiency has been addressed (see below).  Normal folate 6.6  Ferritin 81, iron saturation 28% Reticulocytes 2.8%.  LDH mildly elevated 274.  Bilirubin 2.6 in the setting of liver cirrhosis.  RBC morphology unremarkable. Evidence of CKD stage II, although creatinine is normal (0.50), Cystatin C is elevated at 1.43, with calculated GFR around 64 (using 2021 CKD-EPI Cystatin C-creatinine equation) *** Previously checked SPEP and flow cytometry were normal. - Labs today (06/02/2023): *** - Reports daily nosebleeds described as "large clots coming out of her nose when she sneezes".  She has been seen by ENT*** - No bright red blood per rectum, melena, or hematuria    *** - DIFFERENTIAL DIAGNOSIS favors anemia related to chronic disease/inflammation, functional iron deficiency, and early CKD stage II.  Macrocytosis due to liver cirrhosis. - PLAN: Recommend IV Feraheme x 1.  We reviewed potential side effects and risk of reaction, and patient is agreeable to proceed.  *** TBD *** - Labs and RTC in 3 months (CMP, CBC/D, ferritin, iron/TIBC) - Continue follow-up with GI and ENT   3.  Vitamin B12 deficiency - Labs from 05/14/2021 show normal vitamin B12 at 295, but elevated methylmalonic acid 938 - Vitamin B12 injection given on 06/04/2021 - Has been taking vitamin B12 1000 mcg daily since March *** - Most recent labs (04/30/2023): Vitamin B12 1677/MMA 148 with folate 6.6.   - PLAN: Continue vitamin B12 1000 mcg, but decreased to EVERY OTHER DAY.***     - Recheck vitamin B12 and folate in 6 months (August 2025)   4.  Psoriatic arthritis - Previously took Enbrel - She is not currently on any rheumatologic medications  5.  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  No tobacco, alcohol, or illicit drug use. - FAMILY: Mother had strokes.  Father had heart problems. Brother had prostate cancer.  PLAN SUMMARY:*** TBD *** >> IV Feraheme x 1 >> Labs in 3 months = CBC/D, CMP, ferritin, iron/TIBC >> OFFICE visit in 3 months (1 week after labs)     REVIEW OF SYSTEMS: ***  Review of Systems  Constitutional:  Positive for fatigue. Negative for appetite change, chills, diaphoresis, fever and unexpected weight change.  HENT:   Negative for lump/mass and nosebleeds.   Eyes:  Negative for eye problems.  Respiratory:  Negative for cough, hemoptysis and shortness of breath.   Cardiovascular:  Positive for chest pain and palpitations. Negative for leg swelling.  Gastrointestinal:  Negative for abdominal pain, blood in stool, constipation, diarrhea, nausea and vomiting.  Genitourinary:  Negative for hematuria.   Musculoskeletal:  Positive for arthralgias.  Neurological:  Negative for dizziness, headaches and light-headedness.  Hematological:  Does not bruise/bleed easily.     PHYSICAL EXAM:  ECOG PERFORMANCE STATUS: 2 - Symptomatic, <50% confined to bed *** There were no vitals filed for this visit.  There were no vitals filed for this visit.  Physical Exam Constitutional:      Appearance: Normal appearance. She is obese.  HENT:     Head: Normocephalic and atraumatic.  Cardiovascular:     Rate and Rhythm: Regular  rhythm. Bradycardia present.     Pulses: Normal pulses.     Heart sounds: Murmur heard.     Comments: HR 54 Pulmonary:     Effort: Pulmonary effort is normal.     Breath sounds: Normal breath sounds.  Abdominal:     General: Bowel sounds are normal.     Palpations: Abdomen is soft.     Tenderness: There is no abdominal tenderness.  Neurological:     General: No focal deficit  present.     Mental Status: She is alert and oriented to person, place, and time.  Psychiatric:        Mood and Affect: Mood normal.        Behavior: Behavior normal.     PAST MEDICAL/SURGICAL HISTORY:  Past Medical History:  Diagnosis Date   Anemia    Anxiety    Arthritis    Cataract    Chronic back pain    Coronary artery disease    Multivessel disease, status post DES to mid LAD 05/2020   Essential hypertension    Fibromyalgia    Hiatal hernia    History of blood transfusion    Hypercholesteremia    Hypothyroidism    Osteoporosis    Type 2 diabetes mellitus (HCC)    Past Surgical History:  Procedure Laterality Date   ABDOMINAL HYSTERECTOMY     BACK SURGERY     C-EYE SURGERY PROCEDURE     CARPAL TUNNEL RELEASE     CATARACT EXTRACTION     CHOLECYSTECTOMY     COLONOSCOPY  08/06/06 FIELDS   COLONOSCOPY WITH PROPOFOL N/A 09/18/2021   Procedure: COLONOSCOPY WITH PROPOFOL;  Surgeon: Dolores Frame, MD;  [facility];  Service: Gastroenterology;  Laterality: N/A;  945   CORONARY IMAGING/OCT N/A 05/24/2020   Procedure: INTRAVASCULAR IMAGING/OCT;  Surgeon: Kathleene Hazel, MD;  [facility];  Service: Cardiovascular;  Laterality: N/A;   CORONARY PRESSURE/FFR STUDY N/A 05/24/2020   Procedure: INTRAVASCULAR PRESSURE WIRE/FFR STUDY;  Surgeon: Kathleene Hazel, MD;  [facility]: [facility];  Service: Cardiovascular;  Laterality: N/A;   CORONARY STENT INTERVENTION  05/24/2020   CORONARY STENT INTERVENTION N/A 05/24/2020   Procedure: CORONARY STENT INTERVENTION;  Surgeon: Kathleene Hazel, MD;  [facility]: [facility];  Service: Cardiovascular;  Laterality: N/A;   DENTAL SURGERY  04/2015   reconstructive surgery lower   ESOPHAGOGASTRODUODENOSCOPY  10/31/2010   Procedure: ESOPHAGOGASTRODUODENOSCOPY (EGD);  Surgeon: Malissa Hippo, MD;  [facility]: [facility];  Service: Endoscopy;  Laterality: N/A;    ESOPHAGOGASTRODUODENOSCOPY  03/05/2011   Procedure: ESOPHAGOGASTRODUODENOSCOPY (EGD);  Surgeon: Malissa Hippo, MD;  [facility];  Service: Endoscopy;  Laterality: N/A;  12:00   ESOPHAGOGASTRODUODENOSCOPY  04/28/2012   Procedure: ESOPHAGOGASTRODUODENOSCOPY (EGD);  Surgeon: Malissa Hippo, MD;  [facility];  Service: Endoscopy;  Laterality: N/A;  140-moved to 10:30 Ann notified pt   ESOPHAGOGASTRODUODENOSCOPY (EGD) WITH PROPOFOL N/A 09/18/2021   Procedure: ESOPHAGOGASTRODUODENOSCOPY (EGD) WITH PROPOFOL;  Surgeon: Dolores Frame, MD;  [facility];  Service: Gastroenterology;  Laterality: N/A;   EYE SURGERY     LEFT HEART CATH AND CORONARY ANGIOGRAPHY N/A 05/24/2020   Procedure: LEFT HEART CATH AND CORONARY ANGIOGRAPHY;  Surgeon: Kathleene Hazel, MD;  [facility];  Service: Cardiovascular;  Laterality: N/A;   TONSILLECTOMY     TOTAL HIP REVISION     UPPER GASTROINTESTINAL ENDOSCOPY  10/03/2010  EGD DILATN PYLORIC CHANNEL   UPPER GASTROINTESTINAL ENDOSCOPY  07/08/2010   UPPER GASTROINTESTINAL ENDOSCOPY  08/06/06   FIELDS   UPPER GASTROINTESTINAL ENDOSCOPY  06/29/2008   EGD    SOCIAL HISTORY:  Social History   Socioeconomic History   Marital status: Married    Spouse name: Not on file   Number of children: Not on file   Years of education: Not on file   Highest education level: Not on file  Occupational History   Occupation: retired  Tobacco Use   Smoking status: Never   Smokeless tobacco: Never  Vaping Use   Vaping status: Never Used  Substance and Sexual Activity   Alcohol use: No    Alcohol/week: 0.0 standard drinks of alcohol   Drug use: No   Sexual activity: Yes    Birth control/protection: Post-menopausal  Other Topics Concern   Not on file  Social History Narrative   Not on file   Social Drivers of Health   Financial Resource Strain: Not on file  Food Insecurity: Not on file  Transportation  Needs: Not on file  Physical Activity: Not on file  Stress: Not on file  Social Connections: Not on file  Intimate Partner Violence: Not on file    FAMILY HISTORY:  Family History  Problem Relation Age of Onset   Diabetes Mother    Stroke Mother    Hyperlipidemia Mother    Hypertension Mother    Stroke Father    Heart disease Father    Hyperlipidemia Father    Hypertension Father    Healthy Son     CURRENT MEDICATIONS:  Outpatient Encounter Medications as of 06/02/2023  Medication Sig   ACCU-CHEK AVIVA PLUS test strip as needed.   acetaminophen (TYLENOL) 500 MG tablet Take 500-1,000 mg by mouth every 6 (six) hours as needed (pain).   aluminum-magnesium hydroxide 200-200 MG/5ML suspension Take by mouth every 6 (six) hours as needed for indigestion. 15-30 ml prn   ascorbic acid (VITAMIN C) 500 MG tablet Take 500 mg by mouth daily.   atorvastatin (LIPITOR) 40 MG tablet Take 40 mg by mouth daily.   calcium carbonate (TUMS - DOSED IN MG ELEMENTAL CALCIUM) 500 MG chewable tablet Chew 1-2 tablets by mouth 3 (three) times daily as needed for indigestion or heartburn.   cholecalciferol (VITAMIN D3) 25 MCG (1000 UNIT) tablet Take 1,000 Units by mouth in the morning.   clopidogrel (PLAVIX) 75 MG tablet Take 1 tablet (75 mg total) by mouth daily with breakfast.   diltiazem (CARDIZEM CD) 120 MG 24 hr capsule Take 1 capsule (120 mg total) by mouth daily. (Patient taking differently: Take 120 mg by mouth every evening.)   escitalopram (LEXAPRO) 10 MG tablet Take 10 mg by mouth as needed.   FEROSUL 325 (65 Fe) MG tablet Take 325 mg by mouth daily with breakfast. Tues, Thurs, and Sat.   fluconazole (DIFLUCAN) 100 MG tablet Take 100 mg by mouth as needed.   fluocinonide ointment (LIDEX) 0.05 % Apply 1 Application topically as needed.   isosorbide mononitrate (IMDUR) 30 MG 24 hr tablet TAKE 1 TABLET BY MOUTH ONCE DAILY.   JARDIANCE 25 MG TABS tablet Take 25 mg by mouth daily.   lactulose  (CHRONULAC) 10 GM/15ML solution Take 30 mLs (20 g total) by mouth daily.   levothyroxine (SYNTHROID, LEVOTHROID) 50 MCG tablet Take 50 mcg by mouth daily before breakfast.   metoprolol tartrate (LOPRESSOR) 50 MG tablet TAKE (1) TABLET BY MOUTH TWICE DAILY.  mupirocin cream (BACTROBAN) 2 % Apply 1 Application topically as needed.   nitroGLYCERIN (NITROSTAT) 0.4 MG SL tablet PLACE 1 TAB UNDER TONGUE EVERY 5 MIN IF NEEDED FOR CHEST PAIN. MAY USE 3 TIMES.NO RELIEF CALL 911.   pantoprazole (PROTONIX) 40 MG tablet Take 40 mg by mouth in the morning.   Polyethyl Glycol-Propyl Glycol (SYSTANE) 0.4-0.3 % SOLN Apply 1-2 drops to eye 4 (four) times daily as needed (dry/irritated eyes.).   temazepam (RESTORIL) 30 MG capsule Take 30 mg by mouth at bedtime.   Vitamin A 2400 MCG (8000 UT) CAPS Take 2,400 mcg by mouth in the morning.   vitamin B-12 (CYANOCOBALAMIN) 1000 MCG tablet Take 1,000 mcg by mouth 3 (three) times a week.   No facility-administered encounter medications on file as of 06/02/2023.    ALLERGIES:  Allergies  Allergen Reactions   Shellfish Allergy Itching and Nausea And Vomiting   Tussigon [Hydrocodone Bit-Homatrop Mbr]     Per patient's husband , this medication caused confusion.   Hydrocodone Itching   Glyburide     Sudden Drop in Blood Sugars   Levofloxacin Itching   Zestril [Lisinopril] Other (See Comments)    SEVERE DROP IN PRESSURE   Morphine Itching and Nausea Only    LABORATORY DATA:  I have reviewed the labs as listed.  CBC    Component Value Date/Time   WBC 3.8 (L) 04/30/2023 1101   RBC 3.14 (L) 04/30/2023 1102   RBC 3.15 (L) 04/30/2023 1101   HGB 10.7 (L) 04/30/2023 1101   HGB 11.8 02/17/2023 1204   HCT 31.8 (L) 04/30/2023 1101   HCT 33.5 (L) 02/17/2023 1204   PLT 74 (L) 04/30/2023 1101   PLT 83 (LL) 02/17/2023 1204   MCV 101.0 (H) 04/30/2023 1101   MCV 99 (H) 02/17/2023 1204   MCH 34.0 04/30/2023 1101   MCHC 33.6 04/30/2023 1101   RDW 15.6 (H) 04/30/2023  1101   RDW 13.9 02/17/2023 1204   LYMPHSABS 0.7 04/30/2023 1101   LYMPHSABS 0.9 08/20/2022 1412   MONOABS 0.4 04/30/2023 1101   EOSABS 0.2 04/30/2023 1101   EOSABS 0.2 08/20/2022 1412   BASOSABS 0.0 04/30/2023 1101   BASOSABS 0.0 08/20/2022 1412      Latest Ref Rng & Units 04/30/2023   11:01 AM 02/17/2023   12:04 PM 01/14/2023    2:58 PM  CMP  Glucose 70 - 99 mg/dL 536  92  90   BUN 8 - 23 mg/dL 7  6  6    Creatinine 0.44 - 1.00 mg/dL 6.44  0.34  7.42   Sodium 135 - 145 mmol/L 134  140  138   Potassium 3.5 - 5.1 mmol/L 3.6  3.9  3.7   Chloride 98 - 111 mmol/L 107  108  110   CO2 22 - 32 mmol/L 20  22  23    Calcium 8.9 - 10.3 mg/dL 8.0  8.0  7.9   Total Protein 6.5 - 8.1 g/dL 5.8  5.5  5.2   Total Bilirubin 0.0 - 1.2 mg/dL 2.6  2.2  2.3   Alkaline Phos 38 - 126 U/L 77  105  81   AST 15 - 41 U/L 38  40  34   ALT 0 - 44 U/L 19  17  16      DIAGNOSTIC IMAGING:  I have independently reviewed the relevant imaging and discussed with the patient.   WRAP UP:  All questions were answered. The patient knows to call the clinic with  any problems, questions or concerns.  Medical decision making: Moderate***  Time spent on visit: I spent 20 minutes counseling the patient face to face. The total time spent in the appointment was 30 minutes and more than 50% was on counseling.  Carnella Guadalajara, PA-C  ***

## 2023-06-02 ENCOUNTER — Inpatient Hospital Stay: Payer: PPO

## 2023-06-02 ENCOUNTER — Inpatient Hospital Stay: Payer: PPO | Attending: Hematology | Admitting: Physician Assistant

## 2023-06-02 VITALS — BP 177/66 | HR 59 | Temp 97.2°F | Resp 18 | Ht 64.0 in | Wt 151.0 lb

## 2023-06-02 VITALS — BP 174/72 | HR 64 | Temp 97.6°F | Resp 19

## 2023-06-02 DIAGNOSIS — E538 Deficiency of other specified B group vitamins: Secondary | ICD-10-CM | POA: Insufficient documentation

## 2023-06-02 DIAGNOSIS — R0609 Other forms of dyspnea: Secondary | ICD-10-CM | POA: Diagnosis not present

## 2023-06-02 DIAGNOSIS — K7460 Unspecified cirrhosis of liver: Secondary | ICD-10-CM | POA: Insufficient documentation

## 2023-06-02 DIAGNOSIS — E611 Iron deficiency: Secondary | ICD-10-CM

## 2023-06-02 DIAGNOSIS — R7989 Other specified abnormal findings of blood chemistry: Secondary | ICD-10-CM | POA: Insufficient documentation

## 2023-06-02 DIAGNOSIS — D509 Iron deficiency anemia, unspecified: Secondary | ICD-10-CM

## 2023-06-02 DIAGNOSIS — D72819 Decreased white blood cell count, unspecified: Secondary | ICD-10-CM | POA: Insufficient documentation

## 2023-06-02 DIAGNOSIS — E1122 Type 2 diabetes mellitus with diabetic chronic kidney disease: Secondary | ICD-10-CM | POA: Diagnosis not present

## 2023-06-02 DIAGNOSIS — R609 Edema, unspecified: Secondary | ICD-10-CM | POA: Insufficient documentation

## 2023-06-02 DIAGNOSIS — D61818 Other pancytopenia: Secondary | ICD-10-CM | POA: Diagnosis not present

## 2023-06-02 DIAGNOSIS — R140 Abdominal distension (gaseous): Secondary | ICD-10-CM | POA: Insufficient documentation

## 2023-06-02 DIAGNOSIS — D696 Thrombocytopenia, unspecified: Secondary | ICD-10-CM | POA: Diagnosis not present

## 2023-06-02 DIAGNOSIS — D631 Anemia in chronic kidney disease: Secondary | ICD-10-CM | POA: Insufficient documentation

## 2023-06-02 DIAGNOSIS — N182 Chronic kidney disease, stage 2 (mild): Secondary | ICD-10-CM | POA: Insufficient documentation

## 2023-06-02 DIAGNOSIS — I129 Hypertensive chronic kidney disease with stage 1 through stage 4 chronic kidney disease, or unspecified chronic kidney disease: Secondary | ICD-10-CM | POA: Diagnosis not present

## 2023-06-02 DIAGNOSIS — L4050 Arthropathic psoriasis, unspecified: Secondary | ICD-10-CM | POA: Insufficient documentation

## 2023-06-02 DIAGNOSIS — Z8042 Family history of malignant neoplasm of prostate: Secondary | ICD-10-CM | POA: Insufficient documentation

## 2023-06-02 LAB — CBC WITH DIFFERENTIAL/PLATELET
Abs Immature Granulocytes: 0.01 10*3/uL (ref 0.00–0.07)
Basophils Absolute: 0 10*3/uL (ref 0.0–0.1)
Basophils Relative: 1 %
Eosinophils Absolute: 0.3 10*3/uL (ref 0.0–0.5)
Eosinophils Relative: 5 %
HCT: 29.7 % — ABNORMAL LOW (ref 36.0–46.0)
Hemoglobin: 9.8 g/dL — ABNORMAL LOW (ref 12.0–15.0)
Immature Granulocytes: 0 %
Lymphocytes Relative: 19 %
Lymphs Abs: 1 10*3/uL (ref 0.7–4.0)
MCH: 33.9 pg (ref 26.0–34.0)
MCHC: 33 g/dL (ref 30.0–36.0)
MCV: 102.8 fL — ABNORMAL HIGH (ref 80.0–100.0)
Monocytes Absolute: 0.6 10*3/uL (ref 0.1–1.0)
Monocytes Relative: 11 %
Neutro Abs: 3.2 10*3/uL (ref 1.7–7.7)
Neutrophils Relative %: 64 %
Platelets: 94 10*3/uL — ABNORMAL LOW (ref 150–400)
RBC: 2.89 MIL/uL — ABNORMAL LOW (ref 3.87–5.11)
RDW: 14.6 % (ref 11.5–15.5)
Smear Review: DECREASED
WBC: 5 10*3/uL (ref 4.0–10.5)
nRBC: 0 % (ref 0.0–0.2)

## 2023-06-02 LAB — FERRITIN: Ferritin: 76 ng/mL (ref 11–307)

## 2023-06-02 LAB — IRON AND TIBC
Iron: 77 ug/dL (ref 28–170)
Saturation Ratios: 24 % (ref 10.4–31.8)
TIBC: 316 ug/dL (ref 250–450)
UIBC: 239 ug/dL

## 2023-06-02 MED ORDER — SODIUM CHLORIDE 0.9 % IV SOLN
510.0000 mg | Freq: Once | INTRAVENOUS | Status: AC
  Administered 2023-06-02: 510 mg via INTRAVENOUS
  Filled 2023-06-02: qty 510

## 2023-06-02 MED ORDER — SODIUM CHLORIDE 0.9 % IV SOLN: INTRAVENOUS | Status: DC

## 2023-06-02 MED ORDER — METHYLPREDNISOLONE SODIUM SUCC 125 MG IJ SOLR
125.0000 mg | Freq: Once | INTRAMUSCULAR | Status: AC
  Administered 2023-06-02: 125 mg via INTRAVENOUS
  Filled 2023-06-02: qty 2

## 2023-06-02 MED ORDER — FAMOTIDINE IN NACL 20-0.9 MG/50ML-% IV SOLN
20.0000 mg | Freq: Once | INTRAVENOUS | Status: AC
  Administered 2023-06-02: 20 mg via INTRAVENOUS
  Filled 2023-06-02: qty 50

## 2023-06-02 MED ORDER — CETIRIZINE HCL 10 MG/ML IV SOLN
10.0000 mg | Freq: Once | INTRAVENOUS | Status: AC
Start: 2023-06-02 — End: 2023-06-02
  Administered 2023-06-02: 10 mg via INTRAVENOUS
  Filled 2023-06-02: qty 1

## 2023-06-02 MED ORDER — ACETAMINOPHEN 500 MG PO TABS
500.0000 mg | ORAL_TABLET | Freq: Once | ORAL | Status: AC
  Administered 2023-06-02: 500 mg via ORAL
  Filled 2023-06-02: qty 1

## 2023-06-02 NOTE — Progress Notes (Signed)
 Patient presents today for Feraheme infusion per providers order.  Vital signs reviewed by PA.  Message received from [facility] PA, patient okay for infusion.  Peripheral IV started and blood return noted pre and post infusion.

## 2023-06-02 NOTE — Patient Instructions (Signed)

## 2023-06-02 NOTE — Progress Notes (Signed)
 Patient tolerated iron infusion well with no complaints voiced.  Patient left via wheelchair in stable condition.  Vital signs stable at discharge.  Follow up as scheduled.

## 2023-06-02 NOTE — Patient Instructions (Signed)
 [facility] at [facility] **VISIT SUMMARY & IMPORTANT INSTRUCTIONS **   You were seen today by Rojelio Brenner PA-C for your follow-up visit.    LOW PLATELETS & LOW WHITE BLOOD CELLS Your platelets/WBCs today are low but stable. Your low platelets/WBCs are most likely related to your underlying liver cirrhosis and enlarged spleen.   IRON DEFICIENCY ANEMIA You have worsening blood and iron levels. This is most likely related to your frequent nosebleeds. We will schedule you for IV iron x 2 doses.  You will receive the first dose today.  Please see the attached handout for information regarding potential side effects.  (We will give premedication to help decrease the risk of side effects or reaction). Please make an appointment with your ENT for treatment of your nosebleeds.  ABDOMINAL SWELLING & WEIGHT GAIN You have evidence of swelling and fluid overload throughout your body. This is most likely related to your liver cirrhosis. I have sent a message to your gastroenterology team (Dr. Levon Hedger & NP Doylene Bode) to see if they can move your appointment up to as soon as possible. If you have not heard from their office by the end of this week, please call them at [phone]. Seek immediate medical attention in the emergency department if you notice any worsening difficulty breathing or severe swelling or abdominal pain.   LABS: Return in 2 months for repeat labs  MEDICATIONS: Continue taking the following... - Iron tablet 3 days a week - Vitamin B12 tablet daily  FOLLOW-UP APPOINTMENT: Same day labs and office visit in 2 months  ** Thank you for trusting me with your healthcare!  I strive to provide all of my patients with quality care at each visit.  If you receive a survey for this visit, I would be so grateful to you for taking the time to provide feedback.  Thank you in advance!  ~ Kodie Pick                   Dr. Doreatha Massed   &   Rojelio Brenner, PA-C   - - - - - - - - - - - - - - - - - -    Thank you for choosing [facility] at [facility] to provide your oncology and hematology care.  To afford each patient quality time with our provider, please arrive at least 15 minutes before your scheduled appointment time.   If you have a lab appointment with the Cancer Center please come in thru the Main Entrance and check in at the main information desk.  You need to re-schedule your appointment should you arrive 10 or more minutes late.  We strive to give you quality time with our providers, and arriving late affects you and other patients whose appointments are after yours.  Also, if you no show three or more times for appointments you may be dismissed from the clinic at the providers discretion.     Again, thank you for choosing [facility].  Our hope is that these requests will decrease the amount of time that you wait before being seen by our physicians.       _____________________________________________________________  Should you have questions after your visit to [facility], please contact our office at [phone] and follow the prompts.  Our office hours are 8:00 a.m. and 4:30 p.m. Monday - Friday.  Please note that voicemails left after 4:00 p.m. may  not be returned until the following business day.  We are closed weekends and major holidays.  You do have access to a nurse 24-7, just call the main number to the clinic [phone] and do not press any options, hold on the line and a nurse will answer the phone.    For prescription refill requests, have your pharmacy contact our office and allow 72 hours.

## 2023-06-03 ENCOUNTER — Ambulatory Visit: Payer: PPO

## 2023-06-04 ENCOUNTER — Encounter: Payer: Self-pay | Admitting: Podiatry

## 2023-06-04 ENCOUNTER — Ambulatory Visit (INDEPENDENT_AMBULATORY_CARE_PROVIDER_SITE_OTHER): Payer: PPO | Admitting: Podiatry

## 2023-06-04 DIAGNOSIS — M79675 Pain in left toe(s): Secondary | ICD-10-CM

## 2023-06-04 DIAGNOSIS — M79674 Pain in right toe(s): Secondary | ICD-10-CM

## 2023-06-04 DIAGNOSIS — E118 Type 2 diabetes mellitus with unspecified complications: Secondary | ICD-10-CM

## 2023-06-04 DIAGNOSIS — B351 Tinea unguium: Secondary | ICD-10-CM | POA: Diagnosis not present

## 2023-06-07 NOTE — Progress Notes (Signed)
  Subjective:  Patient ID: Rebecca Hartman, female    DOB: [birth year],  MRN: [id number]  Chief Complaint  Patient presents with   Nail Problem    Patient is here for RFC    78 y.o. female presents with the above complaint. History confirmed with patient. Patient presenting with pain related to dystrophic thickened elongated nails. Patient is unable to trim own nails related to nail dystrophy and mobility issues. Patient does  have a history of T2DM.  Objective:  Physical Exam: warm, good capillary refill, pedal hair growth absent, pedal skin atrophic nail exam onychomycosis of the toenails, onycholysis, and dystrophic nails DP pulses palpable, PT pulses palpable, and protective sensation intact Left Foot:  Pain with palpation of nails due to elongation and dystrophic growth.  Right Foot: Pain with palpation of nails due to elongation and dystrophic growth.   Assessment:   1. Pain due to onychomycosis of toenails of both feet   2. Controlled type 2 diabetes mellitus with complication, without long-term current use of insulin (HCC)      Plan:  Patient was evaluated and treated and all questions answered.  #Onychomycosis with pain  -Nails palliatively debrided as below. -Educated on self-care  Procedure: Nail Debridement Rationale: Pain Type of Debridement: manual, sharp debridement. Instrumentation: Nail nipper, rotary burr. Number of Nails: 10  Return in about 3 months (around 09/04/2023) for Diabetic Foot Care.         Bronwen Betters, DPM [facility] / [facility]

## 2023-06-10 ENCOUNTER — Inpatient Hospital Stay

## 2023-06-10 VITALS — BP 158/70 | HR 67 | Temp 96.8°F | Resp 18

## 2023-06-10 DIAGNOSIS — D696 Thrombocytopenia, unspecified: Secondary | ICD-10-CM | POA: Diagnosis not present

## 2023-06-10 DIAGNOSIS — D509 Iron deficiency anemia, unspecified: Secondary | ICD-10-CM

## 2023-06-10 MED ORDER — CETIRIZINE HCL 10 MG/ML IV SOLN
10.0000 mg | Freq: Once | INTRAVENOUS | Status: AC
  Administered 2023-06-10: 10 mg via INTRAVENOUS
  Filled 2023-06-10: qty 1

## 2023-06-10 MED ORDER — SODIUM CHLORIDE 0.9 % IV SOLN: INTRAVENOUS | Status: DC

## 2023-06-10 MED ORDER — METHYLPREDNISOLONE SODIUM SUCC 125 MG IJ SOLR
125.0000 mg | Freq: Once | INTRAMUSCULAR | Status: AC
  Administered 2023-06-10: 125 mg via INTRAVENOUS
  Filled 2023-06-10: qty 2

## 2023-06-10 MED ORDER — FAMOTIDINE IN NACL 20-0.9 MG/50ML-% IV SOLN
20.0000 mg | Freq: Once | INTRAVENOUS | Status: AC
  Administered 2023-06-10: 20 mg via INTRAVENOUS
  Filled 2023-06-10: qty 50

## 2023-06-10 MED ORDER — SODIUM CHLORIDE 0.9 % IV SOLN
510.0000 mg | Freq: Once | INTRAVENOUS | Status: AC
  Administered 2023-06-10: 510 mg via INTRAVENOUS
  Filled 2023-06-10: qty 510

## 2023-06-10 MED ORDER — ACETAMINOPHEN 500 MG PO TABS
500.0000 mg | ORAL_TABLET | Freq: Once | ORAL | Status: AC
  Administered 2023-06-10: 500 mg via ORAL
  Filled 2023-06-10: qty 1

## 2023-06-10 NOTE — Patient Instructions (Signed)
 [facility] [place] - A DEPT OF [facility]  Discharge Instructions: Thank you for choosing [facility] to provide your oncology and hematology care.  If you have a lab appointment with the Cancer Center - please note that after April 8th, 2024, all labs will be drawn in the cancer center.  You do not have to check in or register with the main entrance as you have in the past but will complete your check-in in the cancer center.  Wear comfortable clothing and clothing appropriate for easy access to any Portacath or PICC line.   We strive to give you quality time with your provider. You may need to reschedule your appointment if you arrive late (15 or more minutes).  Arriving late affects you and other patients whose appointments are after yours.  Also, if you miss three or more appointments without notifying the office, you may be dismissed from the clinic at the provider's discretion.      For prescription refill requests, have your pharmacy contact our office and allow 72 hours for refills to be completed.    Today you received the following:  Feraheme.  Ferumoxytol Injection What is this medication? FERUMOXYTOL (FER ue MOX i tol) treats low levels of iron in your body (iron deficiency anemia). Iron is a mineral that plays an important role in making red blood cells, which carry oxygen from your lungs to the rest of your body. This medicine may be used for other purposes; ask your health care provider or pharmacist if you have questions. COMMON BRAND NAME(S): Feraheme What should I tell my care team before I take this medication? They need to know if you have any of these conditions: Anemia not caused by low iron levels High levels of iron in the blood Magnetic resonance imaging (MRI) test scheduled An unusual or allergic reaction to iron, other medications, foods, dyes, or preservatives Pregnant or trying to get pregnant Breastfeeding How should I  use this medication? This medication is injected into a vein. It is given by your care team in a hospital or clinic setting. Talk to your care team the use of this medication in children. Special care may be needed. Overdosage: If you think you have taken too much of this medicine contact a poison control center or emergency room at once. NOTE: This medicine is only for you. Do not share this medicine with others. What if I miss a dose? It is important not to miss your dose. Call your care team if you are unable to keep an appointment. What may interact with this medication? Other iron products This list may not describe all possible interactions. Give your health care provider a list of all the medicines, herbs, non-prescription drugs, or dietary supplements you use. Also tell them if you smoke, drink alcohol, or use illegal drugs. Some items may interact with your medicine. What should I watch for while using this medication? Visit your care team for regular checks on your progress. Tell your care team if your symptoms do not start to get better or if they get worse. You may need blood work done while you are taking this medication. You may need to eat more foods that contain iron. Talk to your care team. Foods that contain iron include whole grains or cereals, dried fruits, beans, peas, leafy green vegetables, and organ meats (liver, kidney). What side effects may I notice from receiving this medication? Side effects that you should  report to your care team as soon as possible: Allergic reactions--skin rash, itching, hives, swelling of the face, lips, tongue, or throat Low blood pressure--dizziness, feeling faint or lightheaded, blurry vision Shortness of breath Side effects that usually do not require medical attention (report to your care team if they continue or are bothersome): Flushing Headache Joint pain Muscle pain Nausea Pain, redness, or irritation at injection site This list  may not describe all possible side effects. Call your doctor for medical advice about side effects. You may report side effects to FDA at 1-800-FDA-1088. Where should I keep my medication? This medication is given in a hospital or clinic. It will not be stored at home. NOTE: This sheet is a summary. It may not cover all possible information. If you have questions about this medicine, talk to your doctor, pharmacist, or health care provider.  2024 Elsevier/Gold Standard (2022-11-05 00:00:00)    To help prevent nausea and vomiting after your treatment, we encourage you to take your nausea medication as directed.  BELOW ARE SYMPTOMS THAT SHOULD BE REPORTED IMMEDIATELY: *FEVER GREATER THAN 100.4 F (38 C) OR HIGHER *CHILLS OR SWEATING *NAUSEA AND VOMITING THAT IS NOT CONTROLLED WITH YOUR NAUSEA MEDICATION *UNUSUAL SHORTNESS OF BREATH *UNUSUAL BRUISING OR BLEEDING *URINARY PROBLEMS (pain or burning when urinating, or frequent urination) *BOWEL PROBLEMS (unusual diarrhea, constipation, pain near the anus) TENDERNESS IN MOUTH AND THROAT WITH OR WITHOUT PRESENCE OF ULCERS (sore throat, sores in mouth, or a toothache) UNUSUAL RASH, SWELLING OR PAIN  UNUSUAL VAGINAL DISCHARGE OR ITCHING   Items with * indicate a potential emergency and should be followed up as soon as possible or go to the Emergency Department if any problems should occur.  Please show the CHEMOTHERAPY ALERT CARD or IMMUNOTHERAPY ALERT CARD at check-in to the Emergency Department and triage nurse.  Should you have questions after your visit or need to cancel or reschedule your appointment, please contact [facility] [place] - A DEPT OF Eligha Bridegroom [facility] [phone]  and follow the prompts.  Office hours are 8:00 a.m. to 4:30 p.m. Monday - Friday. Please note that voicemails left after 4:00 p.m. may not be returned until the following business day.  We are closed weekends and major holidays. You have access to a  nurse at all times for urgent questions. Please call the main number to the clinic [phone] and follow the prompts.  For any non-urgent questions, you may also contact your provider using MyChart. We now offer e-Visits for anyone 39 and older to request care online for non-urgent symptoms. For details visit [email or web address].   Also download the MyChart app! Go to the app store, search "MyChart", open the app, select [place], and log in with your MyChart username and password.

## 2023-06-10 NOTE — Progress Notes (Signed)
 Patient presents today for iron infusion.  Patient is in satisfactory condition with no new complaints voiced.  Vital signs are stable.  IV placed in R wrist. IV flushed well with good blood return noted.  We will proceed with infusion per provider orders.    Patient tolerated infusion well with no complaints voiced.  Patient left via wheelchair with husband in stable condition.  Vital signs stable at discharge.  Follow up as scheduled.

## 2023-06-11 ENCOUNTER — Encounter (INDEPENDENT_AMBULATORY_CARE_PROVIDER_SITE_OTHER): Payer: Self-pay | Admitting: Gastroenterology

## 2023-06-11 ENCOUNTER — Ambulatory Visit (INDEPENDENT_AMBULATORY_CARE_PROVIDER_SITE_OTHER): Admitting: Gastroenterology

## 2023-06-11 ENCOUNTER — Encounter: Payer: Self-pay | Admitting: Physician Assistant

## 2023-06-11 VITALS — BP 174/71 | HR 79 | Temp 98.3°F | Ht 64.0 in | Wt 152.6 lb

## 2023-06-11 DIAGNOSIS — D649 Anemia, unspecified: Secondary | ICD-10-CM | POA: Diagnosis not present

## 2023-06-11 DIAGNOSIS — Z862 Personal history of diseases of the blood and blood-forming organs and certain disorders involving the immune mechanism: Secondary | ICD-10-CM | POA: Diagnosis not present

## 2023-06-11 DIAGNOSIS — R188 Other ascites: Secondary | ICD-10-CM | POA: Diagnosis not present

## 2023-06-11 DIAGNOSIS — Z8719 Personal history of other diseases of the digestive system: Secondary | ICD-10-CM

## 2023-06-11 DIAGNOSIS — Z5181 Encounter for therapeutic drug level monitoring: Secondary | ICD-10-CM | POA: Diagnosis not present

## 2023-06-11 DIAGNOSIS — K7460 Unspecified cirrhosis of liver: Secondary | ICD-10-CM | POA: Diagnosis not present

## 2023-06-11 DIAGNOSIS — Z79899 Other long term (current) drug therapy: Secondary | ICD-10-CM | POA: Diagnosis not present

## 2023-06-11 NOTE — Patient Instructions (Signed)
-  We will check blood work to ensure electrolytes and kidney function is good -If lab looks good, we will start two fluid medications to help, as I suspect that swelling to your belly and weight gain are secondary to fluid retention from your liver disease -we will recheck labs again 7-10 days after starting fluid pills as well to ensure kidney function and electrolytes are still stable  -Please let me know if you have any worsening shortness of breath, rectal bleeding, black stools or vomiting -Continue to take your lactulose with goal of 2-3 bowel movements per day - Reduce salt intake to <2 g per day - Can take Tylenol max of 2 g per day (650 mg q8h) for pain - Avoid NSAIDs for pain - Avoid eating raw oysters/shellfish - Ensure every night before going to sleep  Follow up 4-6 weeks  It was a pleasure to see you today. I want to create trusting relationships with patients and provide genuine, compassionate, and quality care. I truly value your feedback! please be on the lookout for a survey regarding your visit with me today. I appreciate your input about our visit and your time in completing this!    Ladeana Laplant L. Jeanmarie Hubert, MSN, APRN, AGNP-C Adult-Gerontology Nurse Practitioner [facility] Gastro[facility] at [facility]

## 2023-06-11 NOTE — Progress Notes (Signed)
 Referring Provider: [name], MD Primary Care Physician:  [name], MD Primary GI Physician: Dr. Levon Hedger   Chief Complaint  Patient presents with   Follow-up    States that her stomach is swollen and it is hard and hurts on the left side. Stools are inconsistent.    HPI:   Rebecca Hartman is a 78 y.o. female with past medical history of coronary artery disease status post stent placement on Plavix, cirrhosis possibly due to NASH c/b non bleeding EV, psoriatic arthritis, hypertension, fibromyalgia, hyperlipidemia, hypothyroidism, type 2 diabetes, osteoporosis   Patient presenting today for:  Follow up of cirrhosis with new onset ascites  Patient last seen November 24, at that time feeling pretty good.  Reports she had some mild dementia and some confusion at times.  Taking lactulose nightly having 2-3 bowel  Patient recommended to update CMP, CBC, INR, AFP, right upper quadrant ultrasound in December, EGD in June 2025, continue lactulose.  Patient seen by Buford Dresser PA with oncology in March for pancytopenia and IDA, patient complained of swelling to her abdomen and legs at that time and had had some weight gain, patient also reported some darker stools but no black stools.  Denied any hematemesis, hematochezia.  Did report some daily nosebleeds for which she had previously seen ENT [facility] for.  Patient was recommended to have IV Feraheme x 2 with repeat labs in 2 months.  Ms. Buford Dresser reached out to myself and Dr. Levon Hedger to make [place] aware of patient's new onset of ascites in hopes that patient be seen sooner in our clinic.  Present: Patient reports She has had more fatigue, dizziness and some sob with walking. She denies rectal bleeding or melena. She was having some nose bleeds a while back, maybe 1 month ago, her husband notes they were quite heavy. She notes no heavy nosebleeds recently but does notably have a small drip of blood from her nose during our encounter  today.   She endorses some pain across her mid abdomen, worse on the Left side. She feels that since early january her abdomen has been swollen and harder, sometimes appearing as though she is pregnant. She has gained some weight as well, 152 lbs today, 134 lbs in November, she denies any known swelling to her legs today. No nausea or vomiting. Appetite is not great. She has had to go up in size in her pants due to swelling in her abdomen.   Has a BM 2-3 times per day, stools are looser, she is taking lactulose PRN, noting if she goes a day where she does not feel she empties out enough, she may take 2-3 doses of lactulose the next day. Husband reports "a lot of episodes of confusion" to include day to day things like remembering appointments. No issues with recognizing family or any issues with recounting her location or being oriented to self. She is alert and oriented x4 during our encounter today.     Previous workup: CBC CMP, hepatitis A total antibody, hepatitis B surface antibody, acute hepatitis panel, AMA, ASMA, ANA, IgG, CMP and INR.  Serologies negative, Labs showed immunity to hep A but not Hep B, advised to get Hep B vaccine with PCP  Last MELD 3.0 Nov 2024: 15 Cirrhosis related questions: Episodes of confusion/disorientation: as above, pt reports history of dementia  Taking diuretics? none Beta blockers? None  Prior history of variceal banding? no Prior episodes of SBP? No  Last liver imaging: 03/2023 Increased  echogenicity of the liver with heterogeneous contour, consistent with cirrhotic change. Prior cholecystectomy with common bile duct dilatation, often seen after cholecystectomy. Last AFP: 01/2023 3.4  Alcohol use: none   Last EGD: 09/18/2021 - Grade I esophageal varices. - 3 cm hiatal hernia. - Gastroesophageal flap valve classified as Hill Grade IV (no fold, wide open lumen, hiatal hernia present). - Portal hypertensive gastropathy. - Normal examined duodenum. - No  specimens collected.   Recommended repeat in 2 years   Last Colonoscopy: 09/18/2021 - Congested mucosa in the ascending colon and in the cecum. Portal colopathy. - The examination was otherwise normal. - Non-bleeding internal hemorrhoids.      Filed Weights   06/11/23 1023  Weight: 152 lb 9.6 oz (69.2 kg)     Past Medical History:  Diagnosis Date   Anemia    Anxiety    Arthritis    Cataract    Chronic back pain    Coronary artery disease    Multivessel disease, status post DES to mid LAD 05/2020   Essential hypertension    Fibromyalgia    Hiatal hernia    History of blood transfusion    Hypercholesteremia    Hypothyroidism    Osteoporosis    Type 2 diabetes mellitus (HCC)     Past Surgical History:  Procedure Laterality Date   ABDOMINAL HYSTERECTOMY     BACK SURGERY     C-EYE SURGERY PROCEDURE     CARPAL TUNNEL RELEASE     CATARACT EXTRACTION     CHOLECYSTECTOMY     COLONOSCOPY  08/06/06 FIELDS   COLONOSCOPY WITH PROPOFOL N/A 09/18/2021   Procedure: COLONOSCOPY WITH PROPOFOL;  Surgeon: Dolores Frame, MD;  [facility];  Service: Gastroenterology;  Laterality: N/A;  945   CORONARY IMAGING/OCT N/A 05/24/2020   Procedure: INTRAVASCULAR IMAGING/OCT;  Surgeon: Kathleene Hazel, MD;  Location: [facility];  Service: Cardiovascular;  Laterality: N/A;   CORONARY PRESSURE/FFR STUDY N/A 05/24/2020   Procedure: INTRAVASCULAR PRESSURE WIRE/FFR STUDY;  Surgeon: Kathleene Hazel, MD;  Location: [facility];  Service: Cardiovascular;  Laterality: N/A;   CORONARY STENT INTERVENTION  05/24/2020   CORONARY STENT INTERVENTION N/A 05/24/2020   Procedure: CORONARY STENT INTERVENTION;  Surgeon: Kathleene Hazel, MD;  Location: [facility];  Service: Cardiovascular;  Laterality: N/A;   DENTAL SURGERY  04/2015   reconstructive surgery lower   ESOPHAGOGASTRODUODENOSCOPY  10/31/2010   Procedure: ESOPHAGOGASTRODUODENOSCOPY (EGD);   Surgeon: Malissa Hippo, MD;  Location: [facility];  Service: Endoscopy;  Laterality: N/A;   ESOPHAGOGASTRODUODENOSCOPY  03/05/2011   Procedure: ESOPHAGOGASTRODUODENOSCOPY (EGD);  Surgeon: Malissa Hippo, MD;  [facility]: [facility];  Service: Endoscopy;  Laterality: N/A;  12:00   ESOPHAGOGASTRODUODENOSCOPY  04/28/2012   Procedure: ESOPHAGOGASTRODUODENOSCOPY (EGD);  Surgeon: Malissa Hippo, MD;  [facility];  Service: Endoscopy;  Laterality: N/A;  140-moved to 10:30 Ann notified pt   ESOPHAGOGASTRODUODENOSCOPY (EGD) WITH PROPOFOL N/A 09/18/2021   Procedure: ESOPHAGOGASTRODUODENOSCOPY (EGD) WITH PROPOFOL;  Surgeon: Dolores Frame, MD;  [facility];  Service: Gastroenterology;  Laterality: N/A;   EYE SURGERY     LEFT HEART CATH AND CORONARY ANGIOGRAPHY N/A 05/24/2020   Procedure: LEFT HEART CATH AND CORONARY ANGIOGRAPHY;  Surgeon: Kathleene Hazel, MD;  [facility];  Service: Cardiovascular;  Laterality: N/A;   TONSILLECTOMY     TOTAL HIP REVISION     UPPER GASTROINTESTINAL ENDOSCOPY  10/03/2010  EGD DILATN PYLORIC CHANNEL   UPPER GASTROINTESTINAL ENDOSCOPY  07/08/2010   UPPER GASTROINTESTINAL ENDOSCOPY  08/06/06   FIELDS   UPPER GASTROINTESTINAL ENDOSCOPY  06/29/2008   EGD    Current Outpatient Medications  Medication Sig Dispense Refill   acetaminophen (TYLENOL) 500 MG tablet Take 500-1,000 mg by mouth every 6 (six) hours as needed (pain).     aluminum-magnesium hydroxide 200-200 MG/5ML suspension Take by mouth every 6 (six) hours as needed for indigestion. 15-30 ml prn     ascorbic acid (VITAMIN C) 500 MG tablet Take 500 mg by mouth daily.     atorvastatin (LIPITOR) 40 MG tablet Take 40 mg by mouth daily.     calcium carbonate (TUMS - DOSED IN MG ELEMENTAL CALCIUM) 500 MG chewable tablet Chew 1-2 tablets by mouth 3 (three) times daily as needed for indigestion or heartburn.     cholecalciferol (VITAMIN D3) 25 MCG (1000 UNIT)  tablet Take 1,000 Units by mouth in the morning.     clopidogrel (PLAVIX) 75 MG tablet Take 1 tablet (75 mg total) by mouth daily with breakfast. 90 tablet 3   diltiazem (CARDIZEM CD) 120 MG 24 hr capsule Take 1 capsule (120 mg total) by mouth daily. (Patient taking differently: Take 120 mg by mouth every evening.) 90 capsule 3   escitalopram (LEXAPRO) 10 MG tablet Take 10 mg by mouth as needed.     FEROSUL 325 (65 Fe) MG tablet Take 325 mg by mouth daily with breakfast. Tues, Thurs, and Sat.     fluconazole (DIFLUCAN) 100 MG tablet Take 100 mg by mouth as needed.     isosorbide mononitrate (IMDUR) 30 MG 24 hr tablet TAKE 1 TABLET BY MOUTH ONCE DAILY. 90 tablet 3   JARDIANCE 25 MG TABS tablet Take 25 mg by mouth daily.     lactulose (CHRONULAC) 10 GM/15ML solution Take 30 mLs (20 g total) by mouth daily. 1892 mL 4   levothyroxine (SYNTHROID, LEVOTHROID) 50 MCG tablet Take 50 mcg by mouth daily before breakfast.     metoprolol tartrate (LOPRESSOR) 50 MG tablet TAKE (1) TABLET BY MOUTH TWICE DAILY. 180 tablet 0   mupirocin cream (BACTROBAN) 2 % Apply 1 Application topically as needed.     nitroGLYCERIN (NITROSTAT) 0.4 MG SL tablet PLACE 1 TAB UNDER TONGUE EVERY 5 MIN IF NEEDED FOR CHEST PAIN. MAY USE 3 TIMES.NO RELIEF CALL 911. 25 tablet 3   pantoprazole (PROTONIX) 40 MG tablet Take 40 mg by mouth in the morning.     Polyethyl Glycol-Propyl Glycol (SYSTANE) 0.4-0.3 % SOLN Apply 1-2 drops to eye 4 (four) times daily as needed (dry/irritated eyes.).     temazepam (RESTORIL) 30 MG capsule Take 30 mg by mouth at bedtime.     Vitamin A 2400 MCG (8000 UT) CAPS Take 2,400 mcg by mouth in the morning.     vitamin B-12 (CYANOCOBALAMIN) 1000 MCG tablet Take 1,000 mcg by mouth 3 (three) times a week.     No current facility-administered medications for this visit.    Allergies as of 06/11/2023 - Review Complete 06/11/2023  Allergen Reaction Noted   Shellfish allergy Itching and Nausea And Vomiting  10/23/2010   Tussigon [hydrocodone bit-homatrop mbr]  05/09/2013   Hydrocodone Itching 08/31/2012   Glyburide  01/06/2011   Levofloxacin Itching 09/03/2010   Zestril [lisinopril] Other (See Comments) 10/23/2010   Morphine Itching and Nausea Only     Social History   Socioeconomic History   Marital status: Married  Spouse name: Not on file   Number of children: Not on file   Years of education: Not on file   Highest education level: Not on file  Occupational History   Occupation: retired  Tobacco Use   Smoking status: Never   Smokeless tobacco: Never  Vaping Use   Vaping status: Never Used  Substance and Sexual Activity   Alcohol use: No    Alcohol/week: 0.0 standard drinks of alcohol   Drug use: No   Sexual activity: Yes    Birth control/protection: Post-menopausal  Other Topics Concern   Not on file  Social History Narrative   Not on file   Social Drivers of Health   Financial Resource Strain: Not on file  Food Insecurity: Not on file  Transportation Needs: Not on file  Physical Activity: Not on file  Stress: Not on file  Social Connections: Not on file    Review of systems General: negative for malaise, night sweats, fever, chills, weight loss Neck: Negative for lumps, goiter, pain and significant neck swelling Resp: Negative for cough, wheezing, dyspnea at rest CV: Negative for chest pain, leg swelling, palpitations, orthopnea GI: denies melena, hematochezia, nausea, vomiting, diarrhea, constipation, dysphagia, odyonophagia, early satiety or unintentional weight loss. +decreased appetite +abdominal swelling and pain  MSK: Negative for joint pain or swelling, back pain, and muscle pain. Derm: Negative for itching or rash Psych: Denies depression, anxiety, memory loss, confusion. No homicidal or suicidal ideation.  Heme: Negative for bruising easily, and swollen nodes.+nosebleeds  Endocrine: Negative for cold or heat intolerance, polyuria, polydipsia and  goiter. Neuro: negative for tremor, gait imbalance, syncope and seizures. The remainder of the review of systems is noncontributory.  Physical Exam: BP (!) 174/71 (BP Location: Right Arm, Patient Position: Sitting, Cuff Size: Normal)   Pulse 79   Temp 98.3 F (36.8 C) (Oral)   Ht 5\' 4"  (1.626 m)   Wt 152 lb 9.6 oz (69.2 kg)   SpO2 96%   BMI 26.19 kg/m  General:   Alert and oriented. No distress noted. Pleasant and cooperative.  Head:  Normocephalic and atraumatic. Eyes:  Conjuctiva clear without scleral icterus. Mouth:  Oral mucosa pink and moist. Good dentition. No lesions. Heart: Normal rate and rhythm, s1 and s2 heart sounds present.  Lungs: crackles in lung bases  Abdomen:  +BS, soft, but mildly distended with pitting edema noted across abdomen. No rebound or guarding. No HSM or masses noted. Derm: No palmar erythema or jaundice Msk:  Symmetrical without gross deformities. Normal posture. Extremities:  2+ pitting edema to LEs, no edema noted to UEs. Neurologic:  Alert and  oriented x4. No asterixis  Psych:  Alert and cooperative. Normal mood and affect.  Invalid input(s): "6 MONTHS"   ASSESSMENT: Rebecca Hartman is a 78 y.o. female presenting today for cirrhosis with new onset of ascites.   Patient with history of Elita Boone cirrhosis that had been relatively well compensated previously though she recently reports swelling to her abdomen that began in January.  She has swelling with pitting edema across her abdomen today though abdomen is not taut, also some pitting edema noted in her lower extremities. She has also had approximately 18 pounds weight gain since November. Presentation overall is concerning for ascites secondary to her cirrhosis which I discussed in depth with the patient and her husband.  I recommended that we check CMP today to evaluate electrolytes and renal function, if these are within normal limits will start Lasix 20 mg and  spironolactone 50 mg daily with plans to  titrate up as needed.   There has been question of suspected HE in the past due to reported confusion though she tells me she has a history of dementia as well, she is maintained on lactulose which she titrates based on her bowel movements, she is having on average 2-3 per day and is a/ox4 today with no asterixis.    She is up to date on MELD labs with last MELD 3.0 in November being 15, HCC screening done in December without any obvious lesions, AFP at that time was 3.4.   History of thrombocytopenia (since 2022) and IDA (since early 2023), being followed by hematology and receiving iron infusions with last iron studies in March WNL, HGB 9.8. no rectal bleeding, melena or hematemesis. history of EV grade 1 and is due for repeat EGD in June. She does endorse some heavy nosebleeds over the last few months which may be contributing to her worsening anemia.  Last colonoscopy in 2023 with portal colopathy.     PLAN:  -CMP today  -if renal function/electrolytes stable, will start lasix 20mg /spironolactone 50mg  daily  -repeat CMP 7-10 days after diuretic therapy -MELD labs and AFP due May 2025 -HCC screening via RUQ [place] due June 2025 -plan for repeat EGD in June -continue lactulose with goal of 2-3 BMs per day -pt to make me aware of rectal bleeding, melena, SOB, vomiting -continue to follow with hematology - Reduce salt intake to <2 g per day - Can take Tylenol max of 2 g per day (650 mg q8h) for pain - Avoid NSAIDs for pain - Avoid eating raw oysters/shellfish - Ensure every night before going to sleep  All questions were answered, patient verbalized understanding and is in agreement with plan as outlined above.   Follow Up: 4-6 weeks   Maleigha Colvard L. Jeanmarie Hubert, MSN, APRN, AGNP-C Adult-Gerontology Nurse Practitioner [facility]  I have reviewed the note and agree with the APP's assessment as described in this progress note  Will also order [place] with Doppler to evaluate  portal vein blood flow.  Katrinka Blazing, MD Gastroenterology and Hepatology [facility]astro[facility]

## 2023-06-12 LAB — COMPREHENSIVE METABOLIC PANEL
ALT: 18 IU/L (ref 0–32)
AST: 34 IU/L (ref 0–40)
Albumin: 3.1 g/dL — ABNORMAL LOW (ref 3.8–4.8)
Alkaline Phosphatase: 111 IU/L (ref 44–121)
BUN/Creatinine Ratio: 10 — ABNORMAL LOW (ref 12–28)
BUN: 8 mg/dL (ref 8–27)
Bilirubin Total: 1.7 mg/dL — ABNORMAL HIGH (ref 0.0–1.2)
CO2: 19 mmol/L — ABNORMAL LOW (ref 20–29)
Calcium: 8.1 mg/dL — ABNORMAL LOW (ref 8.7–10.3)
Chloride: 103 mmol/L (ref 96–106)
Creatinine, Ser: 0.82 mg/dL (ref 0.57–1.00)
Globulin, Total: 2.4 g/dL (ref 1.5–4.5)
Glucose: 149 mg/dL — ABNORMAL HIGH (ref 70–99)
Potassium: 3.9 mmol/L (ref 3.5–5.2)
Sodium: 136 mmol/L (ref 134–144)
Total Protein: 5.5 g/dL — ABNORMAL LOW (ref 6.0–8.5)
eGFR: 74 mL/min/{1.73_m2} (ref >59)

## 2023-06-13 IMAGING — US US ABDOMEN LIMITED W/ ELASTOGRAPHY
2 series · 12 of 25 positions shown · non-contrast
Comparison: None Available.

CLINICAL DATA: Elevated liver function tests. Prior
cholecystectomy.

EXAM:
US ABDOMEN LIMITED - RIGHT UPPER QUADRANT
ULTRASOUND HEPATIC ELASTOGRAPHY
TECHNIQUE: Sonography of the right upper quadrant was performed. In addition,
ultrasound elastography evaluation of the liver was performed. A
region of interest was placed within the right lobe of the liver.
Following application of a compressive sonographic pulse, tissue
compressibility was assessed. Multiple assessments were performed at
the selected site. Median tissue compressibility was determined.
Previously, hepatic stiffness was assessed by shear wave velocity.
Based on recently published Society of Radiologists in Ultrasound
consensus article, reporting is now recommended to be performed in
the SI units of pressure (kiloPascals) representing hepatic
stiffness/elasticity. The obtained result is compared to the
published reference standards. (cACLD = compensated Advanced Chronic
Liver Disease)

[Series 1: us abdomen ruq w/elastography · 9 of 85 slices shown (1 of 2)]
[im 5/85]
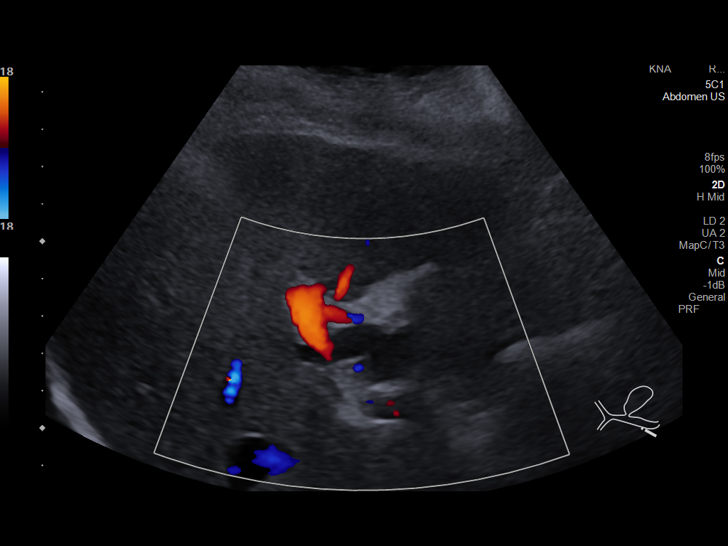
[im 15/85]
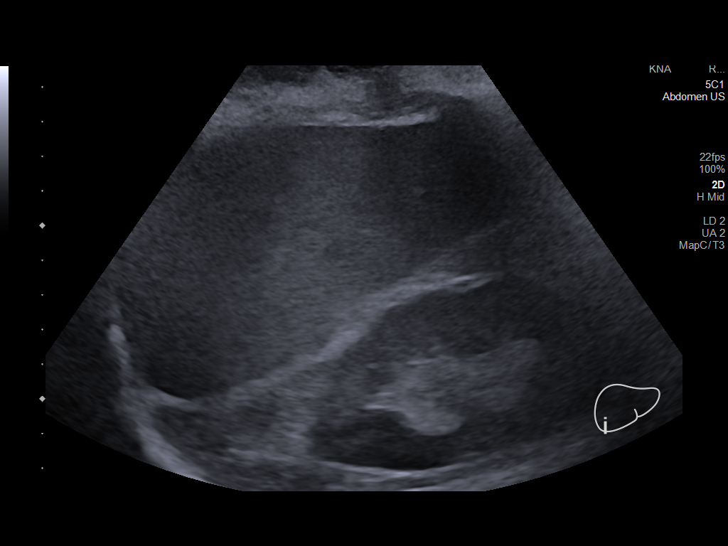
[im 24/85]
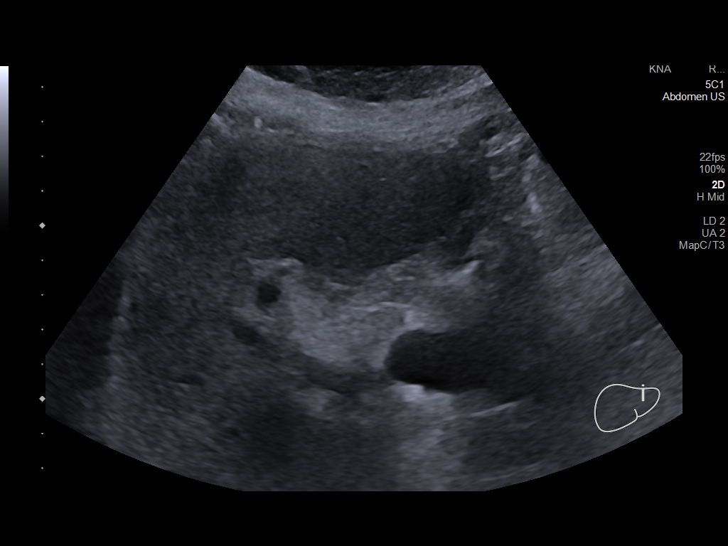
[im 33/85]
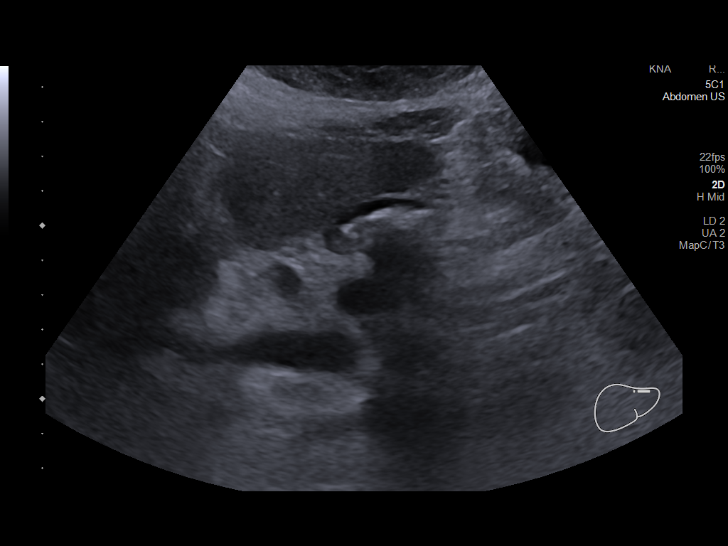
[im 43/85]
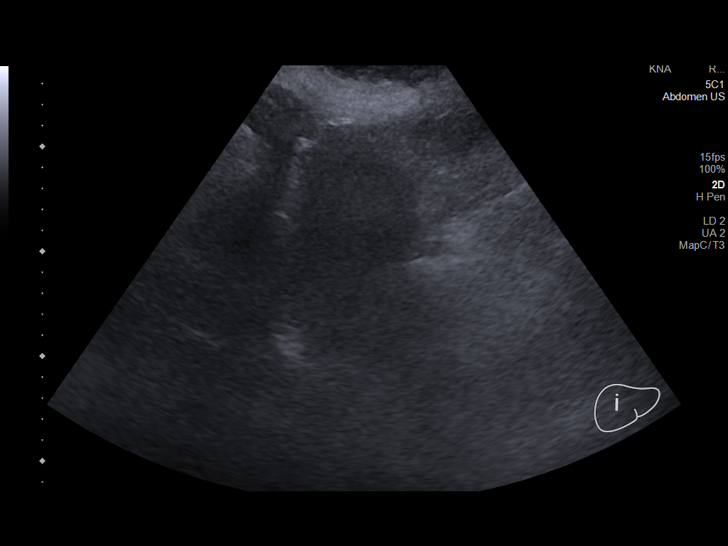
[im 52/85]
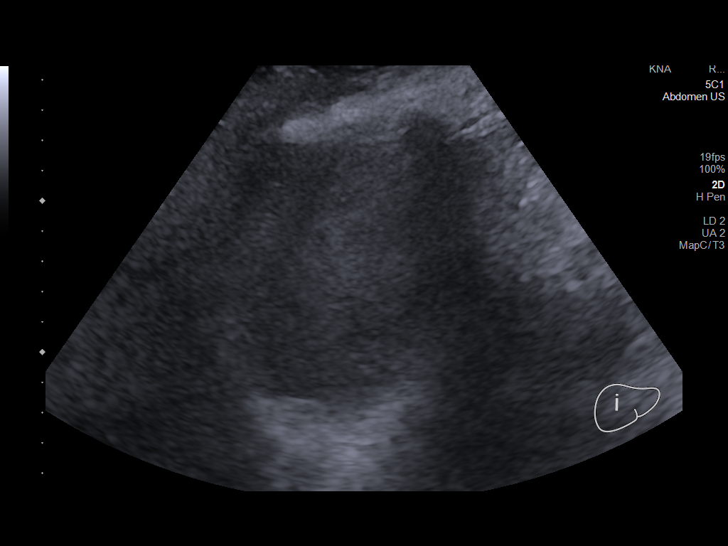
[im 61/85]
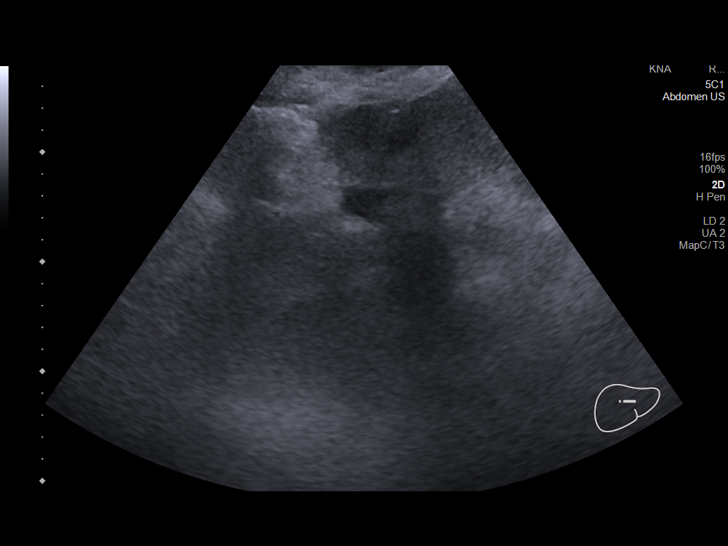
[im 71/85]
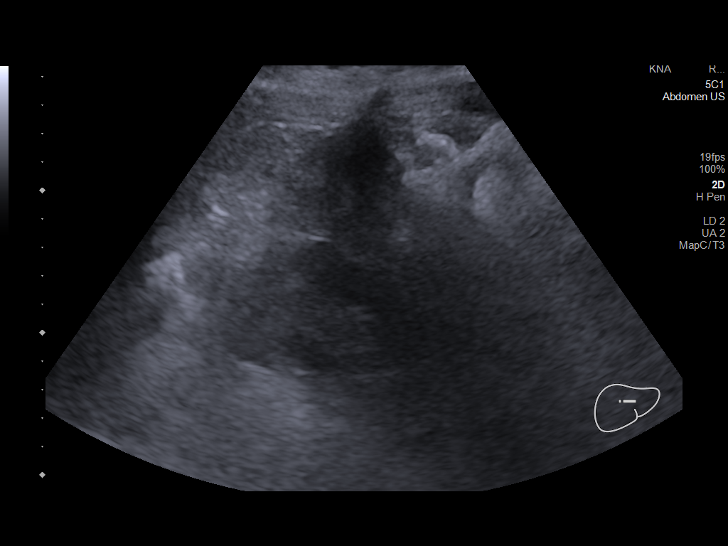
[im 80/85]
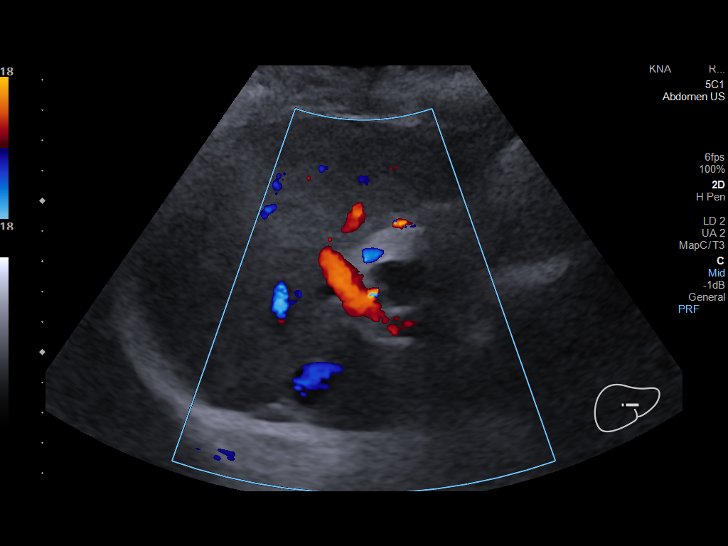

[Series 1001: us abdomen ruq w/elastography · 3 of 25 slices shown (2 of 2)]
[im 1/25]
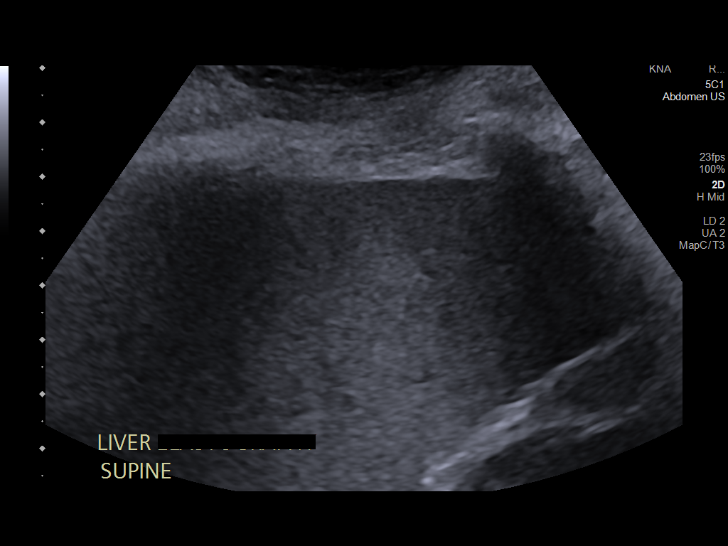
[im 10/25]
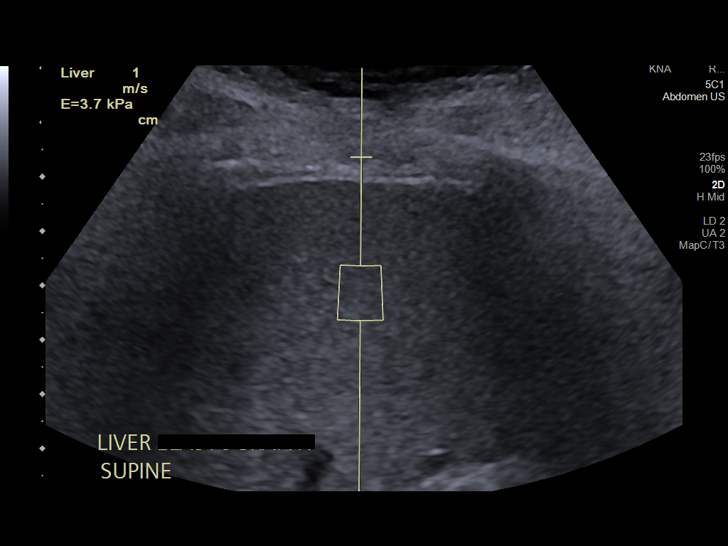
[im 20/25]
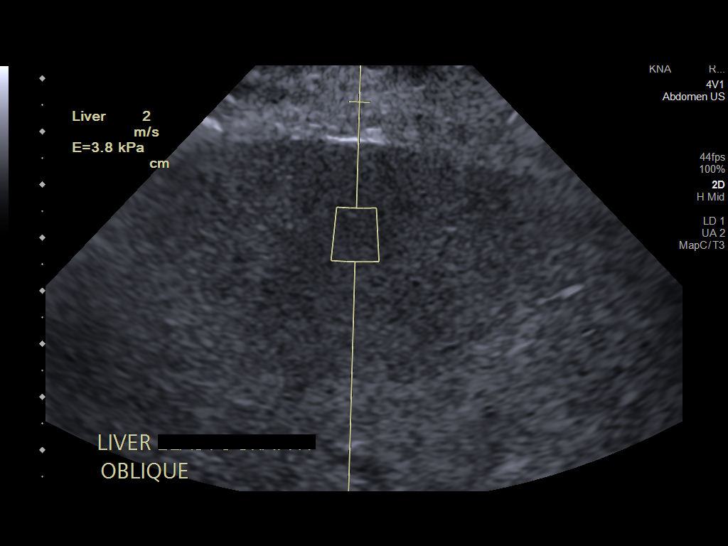

[12 of 25 positions shown; findings below may reference images not displayed]

FINDINGS: ULTRASOUND ABDOMEN LIMITED RIGHT UPPER QUADRANT

Gallbladder:

Surgically absent.

Common bile duct:

Diameter: 9 mm, within normal limits post cholecystectomy.

Liver:

Diffuse coarsening of hepatic echotexture and evidence of capsular
nodularity are suspicious for cirrhosis. No hepatic masses are
identified. Portal vein is patent on color Doppler imaging with
normal direction of blood flow towards the liver.

ULTRASOUND HEPATIC ELASTOGRAPHY

Device: Siemens Helix VTQ

Patient position: Supine

Transducer 4V1

Number of measurements: 10

Hepatic segment:  8

Median kPa:

IQR:

IQR/Median kPa ratio:

Data quality:  Satisfactory

Diagnostic category:  < or = 5 kPa: high probability of being normal

The use of hepatic elastography is applicable to patients with viral
hepatitis and non-alcoholic fatty liver disease. At this time, there
is insufficient data for the referenced cut-off values and use in
other causes of liver disease, including alcoholic liver disease.
Patients, however, may be assessed by elastography and serve as
their own reference standard/baseline.

In patients with non-alcoholic liver disease, the values suggesting
compensated advanced chronic liver disease (cACLD) may be lower, and
patients may need additional testing with elasticity results of [DATE]
kPa.

Please note that abnormal hepatic elasticity and shear wave
velocities may also be identified in clinical settings other than
with hepatic fibrosis, such as: acute hepatitis, elevated right
heart and central venous pressures including use of beta blockers,
Classen disease ([place]), infiltrative processes such as
mastocytosis/amyloidosis/infiltrative tumor/lymphoma, extrahepatic
cholestasis, with hyperemia in the post-prandial state, and with
liver transplantation. Correlation with patient history, laboratory
data, and clinical condition recommended.

Diagnostic Categories:

< or =5 kPa: high probability of being normal

< or =9 kPa: in the absence of other known clinical signs, rules [DATE] kPa and ?13 kPa: suggestive of cACLD, but needs further testing

>13 kPa: highly suggestive of cACLD

> or =17 kPa: highly suggestive of cACLD with an increased
probability of clinically significant portal hypertension
IMPRESSION: ULTRASOUND RUQ:

Grayscale sonographic findings are suspicious for hepatic cirrhosis.
No evidence of hepatic mass.

Prior cholecystectomy.  No evidence of biliary ductal dilatation.

ULTRASOUND HEPATIC ELASTOGRAPHY:

Median kPa:

Diagnostic category:  < or = 5 kPa: high probability of being normal

## 2023-06-15 ENCOUNTER — Other Ambulatory Visit (INDEPENDENT_AMBULATORY_CARE_PROVIDER_SITE_OTHER): Payer: Self-pay | Admitting: Gastroenterology

## 2023-06-15 DIAGNOSIS — R188 Other ascites: Secondary | ICD-10-CM

## 2023-06-15 DIAGNOSIS — Z79899 Other long term (current) drug therapy: Secondary | ICD-10-CM

## 2023-06-15 MED ORDER — SPIRONOLACTONE 50 MG PO TABS: 50.0000 mg | ORAL_TABLET | Freq: Every day | ORAL | 3 refills | Status: DC

## 2023-06-15 MED ORDER — FUROSEMIDE 20 MG PO TABS: 20.0000 mg | ORAL_TABLET | Freq: Every day | ORAL | 3 refills | Status: AC

## 2023-06-15 NOTE — Progress Notes (Signed)
 Ultrasound ordered and scheduled for 06/23/23 at 9:30am. Pt to arrive 15 minutes early. NPO after midnight. Attempted to reach pt but voicemail is full.

## 2023-06-16 NOTE — Progress Notes (Signed)
 Pt husband contacted. Appt info given to husband. (Ok per [employer])

## 2023-06-23 ENCOUNTER — Ambulatory Visit (HOSPITAL_COMMUNITY)
Admission: RE | Admit: 2023-06-23 | Discharge: 2023-06-23 | Disposition: A | Discharge status: Home / Self Care | Source: Ambulatory Visit | Attending: Gastroenterology | Admitting: Gastroenterology

## 2023-06-23 DIAGNOSIS — E039 Hypothyroidism, unspecified: Secondary | ICD-10-CM | POA: Diagnosis not present

## 2023-06-23 DIAGNOSIS — E782 Mixed hyperlipidemia: Secondary | ICD-10-CM | POA: Diagnosis not present

## 2023-06-23 DIAGNOSIS — R161 Splenomegaly, not elsewhere classified: Secondary | ICD-10-CM | POA: Diagnosis not present

## 2023-06-23 DIAGNOSIS — R188 Other ascites: Secondary | ICD-10-CM | POA: Diagnosis not present

## 2023-06-23 DIAGNOSIS — K766 Portal hypertension: Secondary | ICD-10-CM | POA: Diagnosis not present

## 2023-06-23 DIAGNOSIS — K7460 Unspecified cirrhosis of liver: Secondary | ICD-10-CM | POA: Diagnosis not present

## 2023-06-23 DIAGNOSIS — E1140 Type 2 diabetes mellitus with diabetic neuropathy, unspecified: Secondary | ICD-10-CM | POA: Diagnosis not present

## 2023-06-23 DIAGNOSIS — E559 Vitamin D deficiency, unspecified: Secondary | ICD-10-CM | POA: Diagnosis not present

## 2023-06-23 DIAGNOSIS — R935 Abnormal findings on diagnostic imaging of other abdominal regions, including retroperitoneum: Secondary | ICD-10-CM | POA: Diagnosis not present

## 2023-06-29 DIAGNOSIS — E1142 Type 2 diabetes mellitus with diabetic polyneuropathy: Secondary | ICD-10-CM | POA: Diagnosis not present

## 2023-06-29 DIAGNOSIS — F411 Generalized anxiety disorder: Secondary | ICD-10-CM | POA: Diagnosis not present

## 2023-06-29 DIAGNOSIS — I2510 Atherosclerotic heart disease of native coronary artery without angina pectoris: Secondary | ICD-10-CM | POA: Diagnosis not present

## 2023-06-29 DIAGNOSIS — I350 Nonrheumatic aortic (valve) stenosis: Secondary | ICD-10-CM | POA: Diagnosis not present

## 2023-06-29 DIAGNOSIS — F5105 Insomnia due to other mental disorder: Secondary | ICD-10-CM | POA: Diagnosis not present

## 2023-06-29 DIAGNOSIS — M810 Age-related osteoporosis without current pathological fracture: Secondary | ICD-10-CM | POA: Diagnosis not present

## 2023-06-29 DIAGNOSIS — E559 Vitamin D deficiency, unspecified: Secondary | ICD-10-CM | POA: Diagnosis not present

## 2023-06-29 DIAGNOSIS — E782 Mixed hyperlipidemia: Secondary | ICD-10-CM | POA: Diagnosis not present

## 2023-06-29 DIAGNOSIS — D509 Iron deficiency anemia, unspecified: Secondary | ICD-10-CM | POA: Diagnosis not present

## 2023-06-29 DIAGNOSIS — L4050 Arthropathic psoriasis, unspecified: Secondary | ICD-10-CM | POA: Diagnosis not present

## 2023-06-29 DIAGNOSIS — E039 Hypothyroidism, unspecified: Secondary | ICD-10-CM | POA: Diagnosis not present

## 2023-06-29 DIAGNOSIS — K219 Gastro-esophageal reflux disease without esophagitis: Secondary | ICD-10-CM | POA: Diagnosis not present

## 2023-06-30 ENCOUNTER — Telehealth: Payer: Self-pay

## 2023-06-30 NOTE — Progress Notes (Signed)
   06/30/2023  Patient ID: Rebecca Hartman, female   DOB: 1945-04-03, 78 y.o.   MRN: [id number]  HTA CSNP 90DS Conversion  Patient has HTA CSNP, has been filling Jardiance 10 mg for 30DS consistently at [facility]. Did not appear on any adherence quality lists but is eligible for 90DS. Talked with [facility], they lose money on dispensing this medication and the loss is less if they dispense 30DS instead of 90DS. Unable to convert to a 90DS but patient has remained adherent to this medication. Will review adherence monthly to ensure she is filling on time.   Fayette Pho, PharmD

## 2023-07-22 ENCOUNTER — Telehealth: Payer: Self-pay

## 2023-07-22 NOTE — Progress Notes (Signed)
   07/22/2023  Patient ID: Rebecca Hartman, female   DOB: 03/10/1946, 78 y.o.   MRN: [id number]  Adherence Monitoring  Not at risk of failing any adherence metrics, no longer on ACE-I/Arb, taking Jardiance  and atorvastatin . Up to date on both medications.  -Atorvastatin  20 mg - Last fill 90DS on 05/21/23, LDL 27 on 06/23/23. Does not qualify for metric yet this year, next fill due 08/19/23  -Jardiance  10 mg - Last fill 30DS on 07/17/23, A1C 4.5% on 06/23/23. Qualifies for metric this year, next fill due 08/16/23.  Plan:  No outreach needed today, next fill history review 08/21/23. Unable to convert Jardiance  to 90DS per [facility], will need to ensure she gets this monthly.   Flint Hummer, PharmD

## 2023-07-23 ENCOUNTER — Ambulatory Visit (INDEPENDENT_AMBULATORY_CARE_PROVIDER_SITE_OTHER): Admitting: Gastroenterology

## 2023-07-23 VITALS — BP 176/65 | HR 69 | Temp 98.9°F | Ht 64.0 in | Wt 128.0 lb

## 2023-07-23 DIAGNOSIS — K7460 Unspecified cirrhosis of liver: Secondary | ICD-10-CM | POA: Diagnosis not present

## 2023-07-23 DIAGNOSIS — R188 Other ascites: Secondary | ICD-10-CM | POA: Diagnosis not present

## 2023-07-23 DIAGNOSIS — D509 Iron deficiency anemia, unspecified: Secondary | ICD-10-CM

## 2023-07-23 DIAGNOSIS — R5383 Other fatigue: Secondary | ICD-10-CM | POA: Insufficient documentation

## 2023-07-23 DIAGNOSIS — R1012 Left upper quadrant pain: Secondary | ICD-10-CM | POA: Diagnosis not present

## 2023-07-23 NOTE — Patient Instructions (Addendum)
-   Check CBC, MELD labs and AFP, iron stores, TSH - Schedule CT abdomen/pelvis with IV contrast - Continue half dose of lactulose  every day - Reduce salt intake to <2 g per day - Can take Tylenol  max of 2 g per day (650 mg q8h) for pain - Avoid NSAIDs for pain - Avoid eating raw oysters/shellfish - Protein shake (Ensure or Boost) every night before going to sleep - Will discuss repeat EGD in next appointment - Continue follow up with hematology

## 2023-07-23 NOTE — Progress Notes (Unsigned)
 Rebecca Hartman, M.D. Gastroenterology & Hepatology [facility] Gastroenterology [address] [place], [place] [address]  Primary Care Physician: Omie Bickers, MD [address] [place] [address]  I will communicate my assessment and recommendations to the referring MD via EMR.  Problems: Cirrhosis complicated by ascites Left lower quadrant abdominal pain  History of Present Illness: Rebecca Hartman is a 78 y.o. female with past medical history of coronary artery disease status post stent placement on Plavix , cirrhosis possibly due to NASH c/b non bleeding EV, psoriatic arthritis, hypertension, fibromyalgia, hyperlipidemia, hypothyroidism, type 2 diabetes, osteoporosis and dementia, coming for follow-up of cirrhosis.  The patient was last seen on 06/11/23. At that time, the patient was ordered CMP showed normal renal function and adequate electrolytes.  She was started on Lasix  20 mg and spironolactone  50 mg daily.  Patient reports that she has felt pain in the left lower quadrant for the last 2 months. The pain can last for several day.She states she has noticed the abdominal wall getting very hard. She has not noticed swelling in this area. Sometimes she may have some diarrhea - she has taken lactulose  to achieve 2-3 bowel movements per day- however, she has been getting more forgetful despite taking this medication.  The patient denies having any nausea, vomiting, fever, chills, hematochezia, melena, hematemesis, abdominal distention, abdominal pain, diarrhea, jaundice, pruritus or weight loss.  She quit taking her diuretics as she "could not leave the house while on the diuretics" due to constant urination.  Patient received 2 IV iron doses recently. Patient reports feeling tired frequently.  Previous workup: CBC CMP, hepatitis A total antibody, hepatitis B surface antibody, acute hepatitis panel, AMA, ASMA, ANA, IgG, CMP and INR.  Serologies  negative, Labs showed immunity to hep A but not Hep B, advised to get Hep B vaccine with PCP  Cirrhosis related questions: Hematemesis/coffee ground emesis: No Abdominal pain: Yes Episodes of confusion/disorientation: has dementia, is forgetful Number of daily bowel movements:2-3 per day Taking diuretics?: No History of variceal bleeding: No Prior history of banding?: No Prior episodes of SBP: No Last liver imaging: 03/2023- US  Increased echogenicity of the liver with heterogeneous contour, consistent with cirrhotic change. Prior cholecystectomy with common bile duct dilatation, often seen after cholecystectomy. Last AFP: 01/2023 3.4  Alcohol use: none Last MELD 3.0 Nov 2024: 15    Last EGD: 09/18/2021 - Grade I esophageal varices. - 3 cm hiatal hernia. - Gastroesophageal flap valve classified as Hill Grade IV (no fold, wide open lumen, hiatal hernia present). - Portal hypertensive gastropathy. - Normal examined duodenum. - No specimens collected.   Recommended repeat in 2 years   Last Colonoscopy: 09/18/2021 - Congested mucosa in the ascending colon and in the cecum. Portal colopathy. - The examination was otherwise normal. - Non-bleeding internal hemorrhoids.  Past Medical History: Past Medical History:  Diagnosis Date   Anemia    Anxiety    Arthritis    Cataract    Chronic back pain    Coronary artery disease    Multivessel disease, status post DES to mid LAD 05/2020   Essential hypertension    Fibromyalgia    Hiatal hernia    History of blood transfusion    Hypercholesteremia    Hypothyroidism    Osteoporosis    Type 2 diabetes mellitus (HCC)     Past Surgical History: Past Surgical History:  Procedure Laterality Date   ABDOMINAL HYSTERECTOMY     BACK  SURGERY     C-EYE SURGERY PROCEDURE     CARPAL TUNNEL RELEASE     CATARACT EXTRACTION     CHOLECYSTECTOMY     COLONOSCOPY  08/06/06 [facility]   COLONOSCOPY WITH PROPOFOL  N/A 09/18/2021   Procedure:  COLONOSCOPY WITH PROPOFOL ;  Surgeon: Urban Garden, MD;  [facility]: [facility];  Service: Gastroenterology;  Laterality: N/A;  945   CORONARY IMAGING/OCT N/A 05/24/2020   Procedure: INTRAVASCULAR IMAGING/OCT;  Surgeon: Odie Benne, MD;  Location: [facility];  Service: Cardiovascular;  Laterality: N/A;   CORONARY PRESSURE/FFR STUDY N/A 05/24/2020   Procedure: INTRAVASCULAR PRESSURE WIRE/FFR STUDY;  Surgeon: Odie Benne, MD;  Location: [facility];  Service: Cardiovascular;  Laterality: N/A;   CORONARY STENT INTERVENTION  05/24/2020   CORONARY STENT INTERVENTION N/A 05/24/2020   Procedure: CORONARY STENT INTERVENTION;  Surgeon: Odie Benne, MD;  [facility]: [facility];  Service: Cardiovascular;  Laterality: N/A;   DENTAL SURGERY  04/2015   reconstructive surgery lower   ESOPHAGOGASTRODUODENOSCOPY  10/31/2010   Procedure: ESOPHAGOGASTRODUODENOSCOPY (EGD);  Surgeon: Ruby Corporal, MD;  Location: [facility];  Service: Endoscopy;  Laterality: N/A;   ESOPHAGOGASTRODUODENOSCOPY  03/05/2011   Procedure: ESOPHAGOGASTRODUODENOSCOPY (EGD);  Surgeon: Ruby Corporal, MD;  [facility]: [facility];  Service: Endoscopy;  Laterality: N/A;  12:00   ESOPHAGOGASTRODUODENOSCOPY  04/28/2012   Procedure: ESOPHAGOGASTRODUODENOSCOPY (EGD);  Surgeon: Ruby Corporal, MD;  [facility];  Service: Endoscopy;  Laterality: N/A;  140-moved to 10:30 Ann notified pt   ESOPHAGOGASTRODUODENOSCOPY (EGD) WITH PROPOFOL  N/A 09/18/2021   Procedure: ESOPHAGOGASTRODUODENOSCOPY (EGD) WITH PROPOFOL ;  Surgeon: Urban Garden, MD;  [facility]: [facility];  Service: Gastroenterology;  Laterality: N/A;   EYE SURGERY     LEFT HEART CATH AND CORONARY ANGIOGRAPHY N/A 05/24/2020   Procedure: LEFT HEART CATH AND CORONARY ANGIOGRAPHY;  Surgeon: Odie Benne, MD;  [facility];  Service: Cardiovascular;  Laterality: N/A;    TONSILLECTOMY     TOTAL HIP REVISION     UPPER GASTROINTESTINAL ENDOSCOPY  10/03/2010   EGD DILATN PYLORIC CHANNEL   UPPER GASTROINTESTINAL ENDOSCOPY  07/08/2010   UPPER GASTROINTESTINAL ENDOSCOPY  08/06/06   [facility]   UPPER GASTROINTESTINAL ENDOSCOPY  06/29/2008   EGD    Family History: Family History  Problem Relation Age of Onset   Diabetes Mother    Stroke Mother    Hyperlipidemia Mother    Hypertension Mother    Stroke Father    Heart disease Father    Hyperlipidemia Father    Hypertension Father    Healthy Son     Social History: Social History   Tobacco Use  Smoking Status Never  Smokeless Tobacco Never   Social History   Substance and Sexual Activity  Alcohol Use No   Alcohol/week: 0.0 standard drinks of alcohol   Social History   Substance and Sexual Activity  Drug Use No    Allergies: Allergies  Allergen Reactions   Shellfish Allergy Itching and Nausea And Vomiting   Tussigon [Hydrocodone  Bit-Homatrop Mbr]     Per patient's husband , this medication caused confusion.   Hydrocodone  Itching   Glyburide     Sudden Drop in Blood Sugars   Levofloxacin Itching   Zestril  [Lisinopril ] Other (See Comments)    SEVERE DROP IN PRESSURE   Morphine Itching and Nausea Only    Medications: Current Outpatient Medications  Medication Sig Dispense Refill   acetaminophen  (  TYLENOL ) 500 MG tablet Take 500-1,000 mg by mouth every 6 (six) hours as needed (pain).     aluminum-magnesium hydroxide 200-200 MG/5ML suspension Take by mouth every 6 (six) hours as needed for indigestion. 15-30 ml prn     ascorbic acid (VITAMIN C) 500 MG tablet Take 500 mg by mouth daily.     atorvastatin  (LIPITOR) 40 MG tablet Take 40 mg by mouth daily.     calcium  carbonate (TUMS - DOSED IN MG ELEMENTAL CALCIUM ) 500 MG chewable tablet Chew 1-2 tablets by mouth 3 (three) times daily as needed for indigestion or heartburn.     cholecalciferol (VITAMIN D3) 25 MCG (1000 UNIT) tablet Take  1,000 Units by mouth in the morning.     clopidogrel  (PLAVIX ) 75 MG tablet Take 1 tablet (75 mg total) by mouth daily with breakfast. 90 tablet 3   diltiazem  (CARDIZEM  CD) 120 MG 24 hr capsule Take 1 capsule (120 mg total) by mouth daily. (Patient taking differently: Take 120 mg by mouth every evening.) 90 capsule 3   escitalopram  (LEXAPRO ) 10 MG tablet Take 10 mg by mouth as needed.     furosemide  (LASIX ) 20 MG tablet Take 1 tablet (20 mg total) by mouth daily. 30 tablet 3   isosorbide  mononitrate (IMDUR ) 30 MG 24 hr tablet TAKE 1 TABLET BY MOUTH ONCE DAILY. 90 tablet 3   JARDIANCE  25 MG TABS tablet Take 25 mg by mouth daily.     levothyroxine  (SYNTHROID , LEVOTHROID) 50 MCG tablet Take 50 mcg by mouth daily before breakfast.     metoprolol  tartrate (LOPRESSOR ) 50 MG tablet TAKE (1) TABLET BY MOUTH TWICE DAILY. 180 tablet 0   mupirocin  cream (BACTROBAN ) 2 % Apply 1 Application topically as needed.     nitroGLYCERIN  (NITROSTAT ) 0.4 MG SL tablet PLACE 1 TAB UNDER TONGUE EVERY 5 MIN IF NEEDED FOR CHEST PAIN. MAY USE 3 TIMES.NO RELIEF CALL 911. 25 tablet 3   pantoprazole  (PROTONIX ) 40 MG tablet Take 40 mg by mouth in the morning.     Polyethyl Glycol-Propyl Glycol (SYSTANE) 0.4-0.3 % SOLN Apply 1-2 drops to eye 4 (four) times daily as needed (dry/irritated eyes.).     spironolactone  (ALDACTONE ) 50 MG tablet Take 1 tablet (50 mg total) by mouth daily. 30 tablet 3   temazepam  (RESTORIL ) 30 MG capsule Take 30 mg by mouth at bedtime.     Vitamin A 2400 MCG (8000 UT) CAPS Take 2,400 mcg by mouth in the morning.     vitamin B-12 (CYANOCOBALAMIN ) 1000 MCG tablet Take 1,000 mcg by mouth 3 (three) times a week.     FEROSUL 325 (65 Fe) MG tablet Take 325 mg by mouth daily with breakfast. Tues, Thurs, and Sat. (Patient not taking: Reported on 07/23/2023)     fluconazole (DIFLUCAN) 100 MG tablet Take 100 mg by mouth as needed. (Patient not taking: Reported on 07/23/2023)     lactulose  (CHRONULAC ) 10 GM/15ML  solution Take 30 mLs (20 g total) by mouth daily. (Patient not taking: Reported on 07/23/2023) 1892 mL 4   No current facility-administered medications for this visit.    Review of Systems: GENERAL: negative for malaise, night sweats HEENT: No changes in hearing or vision, no nose bleeds or other nasal problems. NECK: Negative for lumps, goiter, pain and significant neck swelling RESPIRATORY: Negative for cough, wheezing CARDIOVASCULAR: Negative for chest pain, leg swelling, palpitations, orthopnea GI: SEE HPI MUSCULOSKELETAL: Negative for joint pain or swelling, back pain, and muscle pain. SKIN: Negative for lesions,  rash PSYCH: Negative for sleep disturbance, mood disorder and recent psychosocial stressors. HEMATOLOGY Negative for prolonged bleeding, bruising easily, and swollen nodes. ENDOCRINE: Negative for cold or heat intolerance, polyuria, polydipsia and goiter. NEURO: negative for tremor, gait imbalance, syncope and seizures. The remainder of the review of systems is noncontributory.   Physical Exam: BP (!) 176/65   Pulse 69   Temp 98.9 F (37.2 C) (Oral)   Ht 5\' 4"  (1.626 m)   Wt 128 lb (58.1 kg)   BMI 21.97 kg/m  GENERAL: The patient is AO x3, in no acute distress. HEENT: Head is normocephalic and atraumatic. EOMI are intact. Mouth is well hydrated and without lesions. NECK: Supple. No masses LUNGS: Clear to auscultation. No presence of rhonchi/wheezing/rales. Adequate chest expansion HEART: RRR, normal s1 and s2. ABDOMEN: Mildly tender in the left upper quadrant, no guarding or rebound, there is presence of a small soft mass in the left upper quadrant. BS +. No masses. EXTREMITIES: Without any cyanosis, clubbing, rash, lesions . Has trace LE edema bilaterally.  No asterixis NEUROLOGIC: AOx3, no focal motor deficit. SKIN: no jaundice, no rashes  Imaging/Labs: as above  I personally reviewed and interpreted the available labs, imaging and endoscopic  files.  Impression and Plan: RAFAELITA FOISTER is a 78 y.o. female with past medical history of coronary artery disease status post stent placement on Plavix , cirrhosis possibly due to NASH c/b non bleeding EV, psoriatic arthritis, hypertension, fibromyalgia, hyperlipidemia, hypothyroidism, type 2 diabetes, osteoporosis and dementia, coming for follow-up of cirrhosis.  Patient has presented history of decompensated liver cirrhosis given episodes of ascites in the past.  She has used diuretics in the past but she did not tolerate their chronic use as this was making her "urinate too often".  She has not presented any evidence of recurrent ascites clinically, and in fact has lost significant amount of weight.  For now, we will continue holding diuretics and will recheck MELD labs, as well as schedule ultrasound and check AFP for [facility] screening.  She is not currently presenting any asterixis and I do not consider her memory impairment is related to hepatic encephalopathy, but instead likely related to dementia.  However, she has found lactulose  useful for constipation which she can continue for now.  Lastly, she has presented pain in her left upper quadrant along with distention.  I wonder if her symptoms are not related to hypersplenism causing discomfort.  Will evaluate this further with a CT of the abdomen and pelvis with IV contrast.  - Check CBC, MELD labs and AFP, iron stores, TSH - Schedule CT abdomen/pelvis with IV contrast - Continue half dose of lactulose  every day - Reduce salt intake to <2 g per day - Can take Tylenol  max of 2 g per day (650 mg q8h) for pain - Avoid NSAIDs for pain - Avoid eating raw oysters/shellfish - Protein shake (Ensure or Boost) every night before going to sleep - Will discuss repeat EGD in next appointment - Continue follow up with hematology  All questions were answered.      Rebecca Cress, MD Gastroenterology and Hepatology [facility]  Gastroenterology

## 2023-08-03 NOTE — Progress Notes (Unsigned)
 [facility] [address], [place] [address]   CLINIC:  Medical Oncology/Hematology  PCP:  Omie Bickers, MD [address] [place] [address] [phone]   REASON FOR VISIT:  Follow-up for anemia and thrombocytopenia  PRIOR THERAPY: None  CURRENT THERAPY: Surveillance  INTERVAL HISTORY:   Ms. Rebecca Hartman 78 y.o. female returns for routine follow-up of anemia and thrombocytopenia.  She was last seen by Sheril Dines PA-C on 06/02/2023.   Her husband reports that she has been diagnosed with early signs of dementia, and has "good days and bad days" with her cognitive deficits.  At today's visit, she reports feeling fair apart from fatigue.   She received Feraheme  x 2 in March 2025.  She reports feeling some improved energy after IV iron. She denies any hematemesis, hematochezia, melena, or hematuria.  She continues to report nosebleeds, but these have decreased in frequency and intensity since last visit.  She has previously had cauterization with ENT in [place]; has not yet reestablished care with ENT.  She admits to easy bruising but denies petechial rash.     She has some residual fatigue.   She has occasional chest pain (followed by cardiology).   She has occasional lightheadedness and poor balance.  She "feels cold all the time."   She denies any recent headaches, palpitations, pica, or syncope. She continues to follow with GI for liver cirrhosis and intermittent LUQ abdominal pain.  At last visit, she had significant fluid retention, but has lost 20 pounds in fluid weight after being placed on diuretic therapy.  No recent or recurrent infections.  She has not noticed any new lumps or bumps.   She denies any B symptoms such as fever, chills, or night sweats.  She has 50% energy and 50% appetite.    ASSESSMENT & PLAN:  1.  Thrombocytopenia and leukopenia (cirrhosis/splenomegaly), with history of drug-induced pancytopenia (resolved) - Initial  onset of pancytopenia in November 2022, which improved after methotrexate and Plaquenil  were held.  (Followed by Dr. Stefan Edge / rheumatology for psoriatic arthritis) - Additional workup revealed B12 deficiency, iron deficiency, and Hemoccult positive stool. - Normal flow cytometry and SPEP. - Follows with Dr. Vaughn Georges for HEPATIC CIRRHOSIS - US  abdomen (06/23/2023): SPLENOMEGALY (16 cm maximum) - Labs today (08/04/2023): Hgb 11.8/MCV 102.1, platelets 92, WBC 4.2/normal differential - No B symptoms or frequent infections.      - Mild pancytopenia improved after methotrexate was discontinued, but she has persistent moderate thrombocytopenia/mild leukopenia which is likely secondary to liver disease/splenomegaly and possible autoimmune thrombocytopenia versus medication effect. - PLAN: DIFFERENTIAL DIAGNOSIS and work-up for persistent thrombocytopenia includes: Thrombocytopenia/leukopenia related to LIVER DISEASE AND SPLENOMEGALY: Continue surveillance and GI follow-up Autoimmune thrombocytopenia: We will consider trial of steroids if platelets <50 MDS or other bone marrow infiltrative process: No indication for bone marrow biopsy at this time.  Would consider bone marrow biopsy if she has worsening cytopenias or development of B symptoms.  Would consider CT CAP in the presence of B symptoms due to increased risk of lymphoproliferative disorders in patients with autoimmune disease. - Repeat labs and RTC in 4 months.   2.  Anemia secondary to IDA + CKD stage II - History of GI bleed in 2012 due to stomach ulcer, required blood transfusions and iron infusions. - Hemoccult stool POSITIVE x3 in February 2023 - EGD (09/18/2021): Grade 1 esophageal varices, portal hypertensive gastropathy - Colonoscopy (09/18/2021): Congested mucosa in ascending colon and  rectum corresponding with portal colopathy - Taking ferrous sulfate  325 mg 3 times weekly - Hematology workup (04/30/2023): Vitamin  B12 deficiency has been addressed (see below).  Normal folate 6.6  Ferritin 81, iron saturation 28% Reticulocytes 2.8%.  LDH mildly elevated 274.  Bilirubin 2.6 in the setting of liver cirrhosis.  RBC morphology unremarkable. Evidence of CKD stage II, although creatinine is normal (0.50), Cystatin C is elevated at 1.43, with calculated GFR around 64 (using 2021 CKD-EPI Cystatin C-creatinine equation)  Previously checked SPEP and flow cytometry were normal. - Received Feraheme  x 2 (with PREMEDS) in March 2025  - Reports intermittent nosebleeds, worse in the winter.  She was previously seen by ENT and received intranasal cauterization last year. - No bright red blood per rectum, melena, or hematuria     - Labs today (08/04/2023): Hgb 11.8/MCV 102.1, ferritin 293, iron saturation 82%. - DIFFERENTIAL DIAGNOSIS favors anemia related to chronic disease/inflammation, functional iron deficiency, and early CKD stage II.  Macrocytosis due to liver cirrhosis. - PLAN: No further IV iron at this time.  Continue iron tablet 3 times weekly. - Labs and RTC in 4 months - Continue follow-up with GI   3.  Vitamin B12 deficiency - Labs from 05/14/2021 show normal vitamin B12 at 295, but elevated methylmalonic acid 938 - Vitamin B12 injection given on 06/04/2021 - Has been taking vitamin B12 1000 mcg daily since March  - Most recent labs (04/30/2023): Vitamin B12 1677/MMA 148 with folate 6.6.   - PLAN: Continue vitamin B12 1000 mcg, but decreased to EVERY OTHER DAY.     - Recheck vitamin B12 and folate in 6 months (August 2025)   4.  Psoriatic arthritis - Previously took Enbrel - She is not currently on any rheumatologic medications  5.  Other history - PMH: Type 2 diabetes mellitus, hypertension, CAD with stents, hypothyroidism, hyperlipidemia, fibromyalgia, psoriatic arthritis, osteoarthritis, LIVER CIRRHOSIS (Dr. 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  No tobacco, alcohol, or illicit drug use. - FAMILY: Mother had strokes.  Father had heart problems. Brother had prostate cancer.  PLAN SUMMARY:  >> Labs in 4 months = CBC/D, CMP, ferritin, iron/TIBC, B12, MMA >> OFFICE visit in 4 months (1 week after labs)     REVIEW OF SYSTEMS:   Review of Systems  Constitutional:  Positive for fatigue. Negative for appetite change, chills, diaphoresis, fever and unexpected weight change.  HENT:   Negative for lump/mass and nosebleeds.   Eyes:  Negative for eye problems.  Respiratory:  Positive for cough and shortness of breath (with exertion). Negative for hemoptysis.   Cardiovascular:  Positive for chest pain and palpitations. Negative for leg swelling.  Gastrointestinal:  Positive for abdominal pain (LUQ abdomainl pain, comes and goes, no obvious triggering or relieving factors, no association w/ food, following w/ GI), constipation and diarrhea. Negative for abdominal distention, blood in stool, nausea and vomiting.  Genitourinary:  Negative for hematuria.   Musculoskeletal:  Positive for arthralgias.  Neurological:  Positive for dizziness and numbness. Negative for headaches and light-headedness.  Hematological:  Does not bruise/bleed easily.     PHYSICAL EXAM:  ECOG PERFORMANCE STATUS: 2 - Symptomatic, <50% confined to bed  Vitals:   08/04/23 1319 08/04/23 1324  BP: (!) 156/48 (!) 158/53  Pulse: 60   Resp: 17   Temp: 97.9 F (36.6 C)   SpO2: 100%    Filed Weights   08/04/23 1319  Weight: 129 lb (58.5 kg)   Physical Exam Constitutional:      Appearance: Normal appearance.  HENT:     Head: Normocephalic and atraumatic.  Cardiovascular:     Rate and Rhythm: Normal rate and regular rhythm.     Pulses: Normal pulses.     Heart sounds: Murmur heard.  Pulmonary:     Effort: Pulmonary effort is normal.     Breath sounds: Normal breath sounds.  Neurological:     General: No focal deficit present.     Mental Status: She is  alert and oriented to person, place, and time.  Psychiatric:        Mood and Affect: Mood normal.        Behavior: Behavior normal.     PAST MEDICAL/SURGICAL HISTORY:  Past Medical History:  Diagnosis Date   Anemia    Anxiety    Arthritis    Cataract    Chronic back pain    Coronary artery disease    Multivessel disease, status post DES to mid LAD 05/2020   Essential hypertension    Fibromyalgia    Hiatal hernia    History of blood transfusion    Hypercholesteremia    Hypothyroidism    Osteoporosis    Type 2 diabetes mellitus (HCC)    Past Surgical History:  Procedure Laterality Date   ABDOMINAL HYSTERECTOMY     BACK SURGERY     C-EYE SURGERY PROCEDURE     CARPAL TUNNEL RELEASE     CATARACT EXTRACTION     CHOLECYSTECTOMY     COLONOSCOPY  08/06/06 [facility]   COLONOSCOPY WITH PROPOFOL  N/A 09/18/2021   Procedure: COLONOSCOPY WITH PROPOFOL ;  Surgeon: Urban Garden, MD;  [facility]: [facility];  Service: Gastroenterology;  Laterality: N/A;  945   CORONARY IMAGING/OCT N/A 05/24/2020   Procedure: INTRAVASCULAR IMAGING/OCT;  Surgeon: Odie Benne, MD;  Location: [facility];  Service: Cardiovascular;  Laterality: N/A;   CORONARY PRESSURE/FFR STUDY N/A 05/24/2020   Procedure: INTRAVASCULAR PRESSURE WIRE/FFR STUDY;  Surgeon: Odie Benne, MD;  Location: [facility];  Service: Cardiovascular;  Laterality: N/A;   CORONARY STENT INTERVENTION  05/24/2020   CORONARY STENT INTERVENTION N/A 05/24/2020   Procedure: CORONARY STENT INTERVENTION;  Surgeon: Odie Benne, MD;  [facility];  Service: Cardiovascular;  Laterality: N/A;   DENTAL SURGERY  04/2015   reconstructive surgery lower   ESOPHAGOGASTRODUODENOSCOPY  10/31/2010   Procedure: ESOPHAGOGASTRODUODENOSCOPY (EGD);  Surgeon: Ruby Corporal, MD;  [facility]: [facility];  Service: Endoscopy;  Laterality: N/A;   ESOPHAGOGASTRODUODENOSCOPY  03/05/2011   Procedure:  ESOPHAGOGASTRODUODENOSCOPY (EGD);  Surgeon: Ruby Corporal, MD;  [facility];  Service: Endoscopy;  Laterality: N/A;  12:00   ESOPHAGOGASTRODUODENOSCOPY  04/28/2012   Procedure: ESOPHAGOGASTRODUODENOSCOPY (EGD);  Surgeon: Ruby Corporal, MD;  [facility];  Service: Endoscopy;  Laterality: N/A;  140-moved to 10:30 Ann notified pt   ESOPHAGOGASTRODUODENOSCOPY (EGD) WITH PROPOFOL  N/A 09/18/2021   Procedure: ESOPHAGOGASTRODUODENOSCOPY (EGD) WITH PROPOFOL ;  Surgeon: Urban Garden, MD;  [facility];  Service: Gastroenterology;  Laterality: N/A;   EYE SURGERY     LEFT HEART CATH AND CORONARY ANGIOGRAPHY N/A 05/24/2020   Procedure: LEFT HEART CATH AND CORONARY ANGIOGRAPHY;  Surgeon: Odie Benne, MD;  [facility];  Service: Cardiovascular;  Laterality: N/A;   TONSILLECTOMY     TOTAL HIP REVISION     UPPER GASTROINTESTINAL ENDOSCOPY  10/03/2010   EGD DILATN PYLORIC CHANNEL   UPPER GASTROINTESTINAL ENDOSCOPY  07/08/2010   UPPER GASTROINTESTINAL ENDOSCOPY  08/06/06   FIELDS   UPPER GASTROINTESTINAL ENDOSCOPY  06/29/2008   EGD    SOCIAL HISTORY:  Social History   Socioeconomic History   Marital status: Married    Spouse name: Not on file   Number of children: Not on file   Years of education: Not on file   Highest education level: Not on file  Occupational History   Occupation: retired  Tobacco Use   Smoking status: Never   Smokeless tobacco: Never  Vaping Use   Vaping status: Never Used  Substance and Sexual Activity   Alcohol use: No    Alcohol/week: 0.0 standard drinks of alcohol   Drug use: No   Sexual activity: Yes    Birth control/protection: Post-menopausal  Other Topics Concern   Not on file  Social History Narrative   Not on file   Social Drivers of Health   Financial Resource Strain: Not on file  Food Insecurity: Not on file  Transportation Needs: Not on file  Physical Activity: Not on file   Stress: Not on file  Social Connections: Not on file  Intimate Partner Violence: Not on file    FAMILY HISTORY:  Family History  Problem Relation Age of Onset   Diabetes Mother    Stroke Mother    Hyperlipidemia Mother    Hypertension Mother    Stroke Father    Heart disease Father    Hyperlipidemia Father    Hypertension Father    Healthy Son     CURRENT MEDICATIONS:  Outpatient Encounter Medications as of 08/04/2023  Medication Sig Note   acetaminophen  (TYLENOL ) 500 MG tablet Take 500-1,000 mg by mouth every 6 (six) hours as needed (pain).    aluminum-magnesium hydroxide 200-200 MG/5ML suspension Take by mouth every 6 (six) hours as needed for indigestion. 15-30 ml prn    ascorbic acid (VITAMIN C) 500 MG tablet Take 500 mg by mouth daily.    atorvastatin  (LIPITOR) 40 MG tablet Take 40 mg by mouth daily.    calcium  carbonate (TUMS - DOSED IN MG ELEMENTAL CALCIUM ) 500 MG chewable tablet Chew 1-2 tablets by mouth 3 (three) times daily as needed for indigestion or heartburn.    cholecalciferol (VITAMIN D3) 25 MCG (1000 UNIT) tablet Take 1,000 Units by mouth in the morning.    clopidogrel  (PLAVIX ) 75 MG tablet Take 1 tablet (75 mg total) by mouth daily with breakfast.    diltiazem  (CARDIZEM  CD) 120 MG 24 hr capsule Take 1 capsule (120 mg total) by mouth daily. (Patient taking differently: Take 120 mg by mouth every evening.)    escitalopram  (LEXAPRO ) 10 MG tablet Take 10 mg by mouth as needed.    FEROSUL 325 (65 Fe) MG tablet Take 325 mg by mouth daily with breakfast. Tues, Thurs, and Sat.    fluconazole (DIFLUCAN) 100 MG tablet Take 100 mg by mouth as needed.    furosemide  (LASIX ) 20 MG tablet Take 1 tablet (20 mg total) by mouth daily. 07/23/2023: Takes as needed    isosorbide  mononitrate (IMDUR ) 30 MG 24 hr tablet TAKE 1 TABLET BY MOUTH ONCE DAILY.    JARDIANCE  25 MG TABS tablet Take 25 mg by mouth daily.    lactulose  (CHRONULAC ) 10 GM/15ML solution Take 30 mLs (20 g total) by  mouth daily.    levothyroxine  (SYNTHROID , LEVOTHROID) 50 MCG tablet Take 50 mcg by mouth daily before  breakfast.    metoprolol  tartrate (LOPRESSOR ) 50 MG tablet TAKE (1) TABLET BY MOUTH TWICE DAILY.    mupirocin  cream (BACTROBAN ) 2 % Apply 1 Application topically as needed.    nitroGLYCERIN  (NITROSTAT ) 0.4 MG SL tablet PLACE 1 TAB UNDER TONGUE EVERY 5 MIN IF NEEDED FOR CHEST PAIN. MAY USE 3 TIMES.NO RELIEF CALL 911.    pantoprazole  (PROTONIX ) 40 MG tablet Take 40 mg by mouth in the morning.    Polyethyl Glycol-Propyl Glycol (SYSTANE) 0.4-0.3 % SOLN Apply 1-2 drops to eye 4 (four) times daily as needed (dry/irritated eyes.).    potassium chloride  (KLOR-CON ) 10 MEQ tablet Take 10 mEq by mouth daily.    spironolactone  (ALDACTONE ) 50 MG tablet Take 1 tablet (50 mg total) by mouth daily.    temazepam  (RESTORIL ) 30 MG capsule Take 30 mg by mouth at bedtime.    Vitamin A 2400 MCG (8000 UT) CAPS Take 2,400 mcg by mouth in the morning.    vitamin B-12 (CYANOCOBALAMIN ) 1000 MCG tablet Take 1,000 mcg by mouth 3 (three) times a week.    No facility-administered encounter medications on file as of 08/04/2023.    ALLERGIES:  Allergies  Allergen Reactions   Shellfish Allergy Itching and Nausea And Vomiting   Tussigon [Hydrocodone  Bit-Homatrop Mbr]     Per patient's husband , this medication caused confusion.   Hydrocodone  Itching   Glyburide     Sudden Drop in Blood Sugars   Levofloxacin Itching   Zestril  [Lisinopril ] Other (See Comments)    SEVERE DROP IN PRESSURE   Morphine Itching and Nausea Only    LABORATORY DATA:  I have reviewed the labs as listed.  CBC    Component Value Date/Time   WBC 4.2 08/04/2023 1255   RBC 3.32 (L) 08/04/2023 1255   HGB 11.8 (L) 08/04/2023 1255   HGB 11.8 02/17/2023 1204   HCT 33.9 (L) 08/04/2023 1255   HCT 33.5 (L) 02/17/2023 1204   PLT 92 (L) 08/04/2023 1255   PLT 83 (LL) 02/17/2023 1204   MCV 102.1 (H) 08/04/2023 1255   MCV 99 (H) 02/17/2023 1204   MCH  35.5 (H) 08/04/2023 1255   MCHC 34.8 08/04/2023 1255   RDW 14.4 08/04/2023 1255   RDW 13.9 02/17/2023 1204   LYMPHSABS 0.8 08/04/2023 1255   LYMPHSABS 0.9 08/20/2022 1412   MONOABS 0.4 08/04/2023 1255   EOSABS 0.3 08/04/2023 1255   EOSABS 0.2 08/20/2022 1412   BASOSABS 0.0 08/04/2023 1255   BASOSABS 0.0 08/20/2022 1412      Latest Ref Rng & Units 08/04/2023   12:55 PM 06/11/2023   11:30 AM 04/30/2023   11:01 AM  CMP  Glucose 70 - 99 mg/dL 478  295  621   BUN 8 - 23 mg/dL 7  8  7    Creatinine 0.44 - 1.00 mg/dL 3.08  6.57  8.46   Sodium 135 - 145 mmol/L 132  136  134   Potassium 3.5 - 5.1 mmol/L 3.7  3.9  3.6   Chloride 98 - 111 mmol/L 105  103  107   CO2 22 - 32 mmol/L 19  19  20    Calcium  8.9 - 10.3 mg/dL 8.1  8.1  8.0   Total Protein 6.5 - 8.1 g/dL 5.1  5.5  5.8   Total Bilirubin 0.0 - 1.2 mg/dL 2.0  1.7  2.6   Alkaline Phos 38 - 126 U/L 78  111  77   AST 15 - 41 U/L 33  34  38   ALT 0 - 44 U/L 20  18  19      DIAGNOSTIC IMAGING:  I have independently reviewed the relevant imaging and discussed with the patient.   WRAP UP:  All questions were answered. The patient knows to call the clinic with any problems, questions or concerns.  Medical decision making: Moderate  Time spent on visit: I spent 20 minutes counseling the patient face to face. The total time spent in the appointment was 30 minutes and more than 50% was on counseling.  Sonnie Dusky, PA-C  08/04/23 2:34 PM

## 2023-08-04 ENCOUNTER — Inpatient Hospital Stay

## 2023-08-04 ENCOUNTER — Inpatient Hospital Stay: Attending: Hematology | Admitting: Physician Assistant

## 2023-08-04 VITALS — BP 158/53 | HR 60 | Temp 97.9°F | Resp 17 | Ht 64.0 in | Wt 129.0 lb

## 2023-08-04 DIAGNOSIS — D61818 Other pancytopenia: Secondary | ICD-10-CM | POA: Diagnosis not present

## 2023-08-04 DIAGNOSIS — D696 Thrombocytopenia, unspecified: Secondary | ICD-10-CM

## 2023-08-04 DIAGNOSIS — D72819 Decreased white blood cell count, unspecified: Secondary | ICD-10-CM | POA: Diagnosis not present

## 2023-08-04 DIAGNOSIS — E611 Iron deficiency: Secondary | ICD-10-CM | POA: Diagnosis not present

## 2023-08-04 DIAGNOSIS — D509 Iron deficiency anemia, unspecified: Secondary | ICD-10-CM | POA: Insufficient documentation

## 2023-08-04 DIAGNOSIS — D631 Anemia in chronic kidney disease: Secondary | ICD-10-CM | POA: Diagnosis not present

## 2023-08-04 DIAGNOSIS — R161 Splenomegaly, not elsewhere classified: Secondary | ICD-10-CM | POA: Insufficient documentation

## 2023-08-04 DIAGNOSIS — L4050 Arthropathic psoriasis, unspecified: Secondary | ICD-10-CM | POA: Diagnosis not present

## 2023-08-04 DIAGNOSIS — R7989 Other specified abnormal findings of blood chemistry: Secondary | ICD-10-CM | POA: Diagnosis not present

## 2023-08-04 DIAGNOSIS — E538 Deficiency of other specified B group vitamins: Secondary | ICD-10-CM | POA: Diagnosis not present

## 2023-08-04 DIAGNOSIS — N182 Chronic kidney disease, stage 2 (mild): Secondary | ICD-10-CM | POA: Insufficient documentation

## 2023-08-04 LAB — IRON AND TIBC
Iron: 159 ug/dL (ref 28–170)
Saturation Ratios: 82 % — ABNORMAL HIGH (ref 10.4–31.8)
TIBC: 194 ug/dL — ABNORMAL LOW (ref 250–450)
UIBC: 35 ug/dL

## 2023-08-04 LAB — CBC WITH DIFFERENTIAL/PLATELET
Abs Immature Granulocytes: 0.01 K/uL (ref 0.00–0.07)
Basophils Absolute: 0 K/uL (ref 0.0–0.1)
Basophils Relative: 1 %
Eosinophils Absolute: 0.3 K/uL (ref 0.0–0.5)
Eosinophils Relative: 6 %
HCT: 33.9 % — ABNORMAL LOW (ref 36.0–46.0)
Hemoglobin: 11.8 g/dL — ABNORMAL LOW (ref 12.0–15.0)
Immature Granulocytes: 0 %
Lymphocytes Relative: 18 %
Lymphs Abs: 0.8 K/uL (ref 0.7–4.0)
MCH: 35.5 pg — ABNORMAL HIGH (ref 26.0–34.0)
MCHC: 34.8 g/dL (ref 30.0–36.0)
MCV: 102.1 fL — ABNORMAL HIGH (ref 80.0–100.0)
Monocytes Absolute: 0.4 K/uL (ref 0.1–1.0)
Monocytes Relative: 10 %
Neutro Abs: 2.8 K/uL (ref 1.7–7.7)
Neutrophils Relative %: 65 %
Platelets: 92 K/uL — ABNORMAL LOW (ref 150–400)
RBC: 3.32 MIL/uL — ABNORMAL LOW (ref 3.87–5.11)
RDW: 14.4 % (ref 11.5–15.5)
WBC: 4.2 K/uL (ref 4.0–10.5)
nRBC: 0 % (ref 0.0–0.2)

## 2023-08-04 LAB — COMPREHENSIVE METABOLIC PANEL WITH GFR
ALT: 20 U/L (ref 0–44)
AST: 33 U/L (ref 15–41)
Albumin: 2.6 g/dL — ABNORMAL LOW (ref 3.5–5.0)
Alkaline Phosphatase: 78 U/L (ref 38–126)
Anion gap: 8 (ref 5–15)
BUN: 7 mg/dL — ABNORMAL LOW (ref 8–23)
CO2: 19 mmol/L — ABNORMAL LOW (ref 22–32)
Calcium: 8.1 mg/dL — ABNORMAL LOW (ref 8.9–10.3)
Chloride: 105 mmol/L (ref 98–111)
Creatinine, Ser: 0.55 mg/dL (ref 0.44–1.00)
GFR, Estimated: >60 mL/min (ref >60)
Glucose, Bld: 162 mg/dL — ABNORMAL HIGH (ref 70–99)
Potassium: 3.7 mmol/L (ref 3.5–5.1)
Sodium: 132 mmol/L — ABNORMAL LOW (ref 135–145)
Total Bilirubin: 2 mg/dL — ABNORMAL HIGH (ref 0.0–1.2)
Total Protein: 5.1 g/dL — ABNORMAL LOW (ref 6.5–8.1)

## 2023-08-04 LAB — FERRITIN: Ferritin: 293 ng/mL (ref 11–307)

## 2023-08-04 NOTE — Patient Instructions (Signed)
 [facility] at [facility] **VISIT SUMMARY & IMPORTANT INSTRUCTIONS **   You were seen today by Sheril Dines PA-C for your follow-up visit.    LOW PLATELETS & LOW WHITE BLOOD CELLS Your platelets/WBCs today are low but stable. Your low platelets/WBCs are most likely related to your underlying liver cirrhosis and enlarged spleen.   IRON DEFICIENCY ANEMIA Your blood and iron levels look much better after your IV iron! Please make an appointment with your ENT for treatment of your nosebleeds.  LABS: Return in 4 months for repeat labs  MEDICATIONS: Continue taking the following... - Iron tablet 3 days a week - Vitamin B12 tablet daily  FOLLOW-UP APPOINTMENT: Labs 4 months with office visit 1 week after  ** Thank you for trusting me with your healthcare!  I strive to provide all of my patients with quality care at each visit.  If you receive a survey for this visit, I would be so grateful to you for taking the time to provide feedback.  Thank you in advance!  ~ Rebecca Hartman                   Dr. Paulett Boros   &   Sheril Dines, PA-C   - - - - - - - - - - - - - - - - - -    Thank you for choosing [facility] at [facility] to provide your oncology and hematology care.  To afford each patient quality time with our provider, please arrive at least 15 minutes before your scheduled appointment time.   If you have a lab appointment with the Cancer Center please come in thru the Main Entrance and check in at the main information desk.  You need to re-schedule your appointment should you arrive 10 or more minutes late.  We strive to give you quality time with our providers, and arriving late affects you and other patients whose appointments are after yours.  Also, if you no show three or more times for appointments you may be dismissed from the clinic at the providers discretion.     Again, thank you for choosing [facility].   Our hope is that these requests will decrease the amount of time that you wait before being seen by our physicians.       _____________________________________________________________  Should you have questions after your visit to [facility], please contact our office at [phone] and follow the prompts.  Our office hours are 8:00 a.m. and 4:30 p.m. Monday - Friday.  Please note that voicemails left after 4:00 p.m. may not be returned until the following business day.  We are closed weekends and major holidays.  You do have access to a nurse 24-7, just call the main number to the clinic [phone] and do not press any options, hold on the line and a nurse will answer the phone.    For prescription refill requests, have your pharmacy contact our office and allow 72 hours.

## 2023-08-06 ENCOUNTER — Ambulatory Visit (HOSPITAL_COMMUNITY)
Admission: RE | Admit: 2023-08-06 | Discharge: 2023-08-06 | Disposition: A | Discharge status: Home / Self Care | Source: Ambulatory Visit | Attending: Gastroenterology | Admitting: Gastroenterology

## 2023-08-06 DIAGNOSIS — J90 Pleural effusion, not elsewhere classified: Secondary | ICD-10-CM | POA: Diagnosis not present

## 2023-08-06 DIAGNOSIS — K7460 Unspecified cirrhosis of liver: Secondary | ICD-10-CM | POA: Diagnosis not present

## 2023-08-06 DIAGNOSIS — R748 Abnormal levels of other serum enzymes: Secondary | ICD-10-CM | POA: Diagnosis not present

## 2023-08-06 DIAGNOSIS — R188 Other ascites: Secondary | ICD-10-CM | POA: Diagnosis not present

## 2023-08-07 ENCOUNTER — Encounter (INDEPENDENT_AMBULATORY_CARE_PROVIDER_SITE_OTHER): Payer: Self-pay | Admitting: *Deleted

## 2023-08-11 ENCOUNTER — Ambulatory Visit (HOSPITAL_COMMUNITY)
Admission: RE | Admit: 2023-08-11 | Discharge: 2023-08-11 | Disposition: A | Discharge status: Home / Self Care | Source: Ambulatory Visit | Attending: Gastroenterology | Admitting: Gastroenterology

## 2023-08-11 DIAGNOSIS — K7460 Unspecified cirrhosis of liver: Secondary | ICD-10-CM | POA: Insufficient documentation

## 2023-08-11 DIAGNOSIS — K766 Portal hypertension: Secondary | ICD-10-CM | POA: Diagnosis not present

## 2023-08-11 DIAGNOSIS — R188 Other ascites: Secondary | ICD-10-CM | POA: Diagnosis not present

## 2023-08-11 DIAGNOSIS — R161 Splenomegaly, not elsewhere classified: Secondary | ICD-10-CM | POA: Diagnosis not present

## 2023-08-11 DIAGNOSIS — K7469 Other cirrhosis of liver: Secondary | ICD-10-CM | POA: Diagnosis not present

## 2023-08-11 MED ORDER — IOHEXOL 300 MG/ML  SOLN
100.0000 mL | Freq: Once | INTRAMUSCULAR | Status: AC | PRN
  Administered 2023-08-11: 100 mL via INTRAVENOUS

## 2023-08-17 ENCOUNTER — Ambulatory Visit (INDEPENDENT_AMBULATORY_CARE_PROVIDER_SITE_OTHER): Payer: PPO | Admitting: Gastroenterology

## 2023-08-17 ENCOUNTER — Telehealth (INDEPENDENT_AMBULATORY_CARE_PROVIDER_SITE_OTHER): Payer: Self-pay | Admitting: Gastroenterology

## 2023-08-17 ENCOUNTER — Encounter (INDEPENDENT_AMBULATORY_CARE_PROVIDER_SITE_OTHER): Payer: Self-pay | Admitting: Gastroenterology

## 2023-08-17 VITALS — BP 151/79 | HR 58 | Temp 97.8°F | Ht 64.0 in | Wt 130.6 lb

## 2023-08-17 DIAGNOSIS — R188 Other ascites: Secondary | ICD-10-CM

## 2023-08-17 DIAGNOSIS — Z5181 Encounter for therapeutic drug level monitoring: Secondary | ICD-10-CM

## 2023-08-17 DIAGNOSIS — K7460 Unspecified cirrhosis of liver: Secondary | ICD-10-CM

## 2023-08-17 DIAGNOSIS — I8500 Esophageal varices without bleeding: Secondary | ICD-10-CM

## 2023-08-17 NOTE — Progress Notes (Addendum)
 Referring Provider: Omie Bickers, MD Primary Care Physician:  Omie Bickers, MD Primary GI Physician: Dr. Sammi Crick   Chief Complaint  Patient presents with   Follow-up    Patient here today for a follow up on cirrhosis.     HPI:   Rebecca Hartman is a 78 y.o. female with past medical history of coronary artery disease status post stent placement on Plavix , cirrhosis possibly due to NASH c/b non bleeding EV, psoriatic arthritis, hypertension, fibromyalgia, hyperlipidemia, hypothyroidism, type 2 diabetes, osteoporosis and dementia   Patient presenting today for:  Follow up of cirrhosis complicated by ascites and EVs   Last seen April, continued pain in LLQ pain x2 months. Sometimes abdomen gets hard. Having 2-3 BMs on lactulose , more forgetful at times. She quit diuretics due to continued urination. Received IV iron recently   Recommended to check CBC, MELD labs, AFP, iron stores, TSH, schedule CT of abdomen, continue half dose lactulose  daily, discuss repeat EGD at follow up visit, can continue to hold diuretics at this time  Labs were not completed CT A/P with contrast 5/13 Cirrhosis with sequelae of portal hypertension including splenomegaly and moderate ascites. Generalized anasarca with additional small right pleural effusion.   RUQ US  08/06/23 with cirrhosis, ascites, R pleural effusion  Present:  Feeling okay. States she took lasix  and spironolactone  for about 10 days and was urinating profusely so she stopped them. She denies over swelling to her belly but has some swelling to her LUQ. She is having 1-3 BMs per day. She feels stools are more solid. Pt and husband report she is confused at baseline due to dementia, has been consistent, no worsening of this recently. Usually does not know the day of the week and somewhat forgetful about events. No swelling to her legs.   No red flag symptoms. Patient denies melena, hematochezia, nausea, vomiting, diarrhea, constipation, dysphagia,  odyonophagia, early satiety or weight loss.   Previous workup: CBC CMP, hepatitis A total antibody, hepatitis B surface antibody, acute hepatitis panel, AMA, ASMA, ANA, IgG, CMP and INR.  Serologies negative, Labs showed immunity to hep A but not Hep B, advised to get Hep B vaccine with PCP   Cirrhosis related questions: Last MELD 3.0 Nov 2024: 15 Hematemesis/coffee ground emesis: No Abdominal pain: Yes Episodes of confusion/disorientation: has dementia, is forgetful Number of daily bowel movements:2-3 per day Taking diuretics?: No History of variceal bleeding: No Prior history of banding?: No Prior episodes of SBP: No Last liver imaging: May 2025, as above  Last AFP: 01/2023 3.4  Alcohol use: none  Last EGD: 09/18/2021 - Grade I esophageal varices. - 3 cm hiatal hernia. - Gastroesophageal flap valve classified as Hill Grade IV (no fold, wide open lumen, hiatal hernia present). - Portal hypertensive gastropathy. - Normal examined duodenum. - No specimens collected.   Recommended repeat in 2 years   Last Colonoscopy: 09/18/2021 - Congested mucosa in the ascending colon and in the cecum. Portal colopathy. - The examination was otherwise normal. - Non-bleeding internal hemorrhoids.   Filed Weights   08/17/23 1212  Weight: 130 lb 9.6 oz (59.2 kg)     Past Medical History:  Diagnosis Date   Anemia    Anxiety    Arthritis    Cataract    Chronic back pain    Coronary artery disease    Multivessel disease, status post DES to mid LAD 05/2020   Essential hypertension    Fibromyalgia    Hiatal hernia  History of blood transfusion    Hypercholesteremia    Hypothyroidism    Osteoporosis    Type 2 diabetes mellitus (HCC)     Past Surgical History:  Procedure Laterality Date   ABDOMINAL HYSTERECTOMY     BACK SURGERY     C-EYE SURGERY PROCEDURE     CARPAL TUNNEL RELEASE     CATARACT EXTRACTION     CHOLECYSTECTOMY     COLONOSCOPY  08/06/06 [facility]DS   COLONOSCOPY WITH  PROPOFOL  N/A 09/18/2021   Procedure: COLONOSCOPY WITH PROPOFOL ;  Surgeon: Urban Garden, MD;  [facility];  Service: Gastroenterology;  Laterality: N/A;  945   CORONARY IMAGING/OCT N/A 05/24/2020   Procedure: INTRAVASCULAR IMAGING/OCT;  Surgeon: Odie Benne, MD;  [facility]: [facility];  Service: Cardiovascular;  Laterality: N/A;   CORONARY PRESSURE/FFR STUDY N/A 05/24/2020   Procedure: INTRAVASCULAR PRESSURE WIRE/FFR STUDY;  Surgeon: Odie Benne, MD;  Location: [facility];  Service: Cardiovascular;  Laterality: N/A;   CORONARY STENT INTERVENTION  05/24/2020   CORONARY STENT INTERVENTION N/A 05/24/2020   Procedure: CORONARY STENT INTERVENTION;  Surgeon: Odie Benne, MD;  [facility]: [facility];  Service: Cardiovascular;  Laterality: N/A;   DENTAL SURGERY  04/2015   reconstructive surgery lower   ESOPHAGOGASTRODUODENOSCOPY  10/31/2010   Procedure: ESOPHAGOGASTRODUODENOSCOPY (EGD);  Surgeon: Ruby Corporal, MD;  [facility];  Service: Endoscopy;  Laterality: N/A;   ESOPHAGOGASTRODUODENOSCOPY  03/05/2011   Procedure: ESOPHAGOGASTRODUODENOSCOPY (EGD);  Surgeon: Ruby Corporal, MD;  [facility];  Service: Endoscopy;  Laterality: N/A;  12:00   ESOPHAGOGASTRODUODENOSCOPY  04/28/2012   Procedure: ESOPHAGOGASTRODUODENOSCOPY (EGD);  Surgeon: Ruby Corporal, MD;  [facility];  Service: Endoscopy;  Laterality: N/A;  140-moved to 10:30 Ann notified pt   ESOPHAGOGASTRODUODENOSCOPY (EGD) WITH PROPOFOL  N/A 09/18/2021   Procedure: ESOPHAGOGASTRODUODENOSCOPY (EGD) WITH PROPOFOL ;  Surgeon: Urban Garden, MD;  [facility];  Service: Gastroenterology;  Laterality: N/A;   EYE SURGERY     LEFT HEART CATH AND CORONARY ANGIOGRAPHY N/A 05/24/2020   Procedure: LEFT HEART CATH AND CORONARY ANGIOGRAPHY;  Surgeon: Odie Benne, MD;  [facility];  Service:  Cardiovascular;  Laterality: N/A;   TONSILLECTOMY     TOTAL HIP REVISION     UPPER GASTROINTESTINAL ENDOSCOPY  10/03/2010   EGD DILATN PYLORIC CHANNEL   UPPER GASTROINTESTINAL ENDOSCOPY  07/08/2010   UPPER GASTROINTESTINAL ENDOSCOPY  08/06/06   FIELDS   UPPER GASTROINTESTINAL ENDOSCOPY  06/29/2008   EGD    Current Outpatient Medications  Medication Sig Dispense Refill   acetaminophen  (TYLENOL ) 500 MG tablet Take 500-1,000 mg by mouth every 6 (six) hours as needed (pain).     aluminum-magnesium hydroxide 200-200 MG/5ML suspension Take by mouth every 6 (six) hours as needed for indigestion. 15-30 ml prn     ascorbic acid (VITAMIN C) 500 MG tablet Take 500 mg by mouth daily.     atorvastatin  (LIPITOR) 40 MG tablet Take 40 mg by mouth daily.     calcium  carbonate (TUMS - DOSED IN MG ELEMENTAL CALCIUM ) 500 MG chewable tablet Chew 1-2 tablets by mouth 3 (three) times daily as needed for indigestion or heartburn.     cholecalciferol (VITAMIN D3) 25 MCG (1000 UNIT) tablet Take 1,000 Units by mouth in the morning.     clopidogrel  (PLAVIX ) 75 MG tablet Take 1 tablet (75 mg total) by mouth daily with breakfast. 90 tablet 3  diltiazem  (CARDIZEM  CD) 120 MG 24 hr capsule Take 1 capsule (120 mg total) by mouth daily. (Patient taking differently: Take 120 mg by mouth every evening.) 90 capsule 3   escitalopram  (LEXAPRO ) 10 MG tablet Take 10 mg by mouth as needed.     FEROSUL 325 (65 Fe) MG tablet Take 325 mg by mouth daily with breakfast. Tues, Thurs, and Sat.     fluconazole (DIFLUCAN) 100 MG tablet Take 100 mg by mouth as needed.     furosemide  (LASIX ) 20 MG tablet Take 1 tablet (20 mg total) by mouth daily. 30 tablet 3   isosorbide  mononitrate (IMDUR ) 30 MG 24 hr tablet TAKE 1 TABLET BY MOUTH ONCE DAILY. 90 tablet 3   JARDIANCE  25 MG TABS tablet Take 25 mg by mouth daily.     lactulose  (CHRONULAC ) 10 GM/15ML solution Take 30 mLs (20 g total) by mouth daily. 1892 mL 4   levothyroxine  (SYNTHROID ,  LEVOTHROID) 50 MCG tablet Take 50 mcg by mouth daily before breakfast.     metoprolol  tartrate (LOPRESSOR ) 50 MG tablet TAKE (1) TABLET BY MOUTH TWICE DAILY. 180 tablet 0   mupirocin  cream (BACTROBAN ) 2 % Apply 1 Application topically as needed.     nitroGLYCERIN  (NITROSTAT ) 0.4 MG SL tablet PLACE 1 TAB UNDER TONGUE EVERY 5 MIN IF NEEDED FOR CHEST PAIN. MAY USE 3 TIMES.NO RELIEF CALL 911. 25 tablet 3   pantoprazole  (PROTONIX ) 40 MG tablet Take 40 mg by mouth in the morning.     Polyethyl Glycol-Propyl Glycol (SYSTANE) 0.4-0.3 % SOLN Apply 1-2 drops to eye 4 (four) times daily as needed (dry/irritated eyes.).     potassium chloride  (KLOR-CON ) 10 MEQ tablet Take 10 mEq by mouth daily.     spironolactone  (ALDACTONE ) 50 MG tablet Take 1 tablet (50 mg total) by mouth daily. 30 tablet 3   temazepam  (RESTORIL ) 30 MG capsule Take 30 mg by mouth at bedtime.     Vitamin A 2400 MCG (8000 UT) CAPS Take 2,400 mcg by mouth in the morning.     vitamin B-12 (CYANOCOBALAMIN ) 1000 MCG tablet Take 1,000 mcg by mouth 3 (three) times a week.     No current facility-administered medications for this visit.    Allergies as of 08/17/2023 - Review Complete 08/04/2023  Allergen Reaction Noted   Shellfish allergy Itching and Nausea And Vomiting 10/23/2010   Tussigon [hydrocodone  bit-homatrop mbr]  05/09/2013   Hydrocodone  Itching 08/31/2012   Glyburide  01/06/2011   Levofloxacin Itching 09/03/2010   Zestril  [lisinopril ] Other (See Comments) 10/23/2010   Morphine Itching and Nausea Only     Social History   Socioeconomic History   Marital status: Married    Spouse name: Not on file   Number of children: Not on file   Years of education: Not on file   Highest education level: Not on file  Occupational History   Occupation: retired  Tobacco Use   Smoking status: Never   Smokeless tobacco: Never  Vaping Use   Vaping status: Never Used  Substance and Sexual Activity   Alcohol use: No    Alcohol/week: 0.0  standard drinks of alcohol   Drug use: No   Sexual activity: Yes    Birth control/protection: Post-menopausal  Other Topics Concern   Not on file  Social History Narrative   Not on file   Social Drivers of Health   Financial Resource Strain: Not on file  Food Insecurity: Not on file  Transportation Needs: Not on file  Physical Activity: Not on file  Stress: Not on file  Social Connections: Not on file    Review of systems General: negative for malaise, night sweats, fever, chills, weight loss Neck: Negative for lumps, goiter, pain and significant neck swelling Resp: Negative for cough, wheezing, dyspnea at rest CV: Negative for chest pain, leg swelling, palpitations, orthopnea GI: denies melena, hematochezia, nausea, vomiting, diarrhea, constipation, dysphagia, odyonophagia, early satiety or unintentional weight loss. +RUQ discomfort  MSK: Negative for joint pain or swelling, back pain, and muscle pain. Derm: Negative for itching or rash Psych: Denies depression, anxiety, memory loss, confusion. No homicidal or suicidal ideation.  Heme: Negative for prolonged bleeding, bruising easily, and swollen nodes. Endocrine: Negative for cold or heat intolerance, polyuria, polydipsia and goiter. Neuro: negative for tremor, gait imbalance, syncope and seizures. The remainder of the review of systems is noncontributory.  Physical Exam: BP (!) 151/59 (BP Location: Left Arm, Patient Position: Sitting, Cuff Size: Normal)   Pulse 63   Temp 97.8 F (36.6 C) (Temporal)   Ht 5\' 4"  (1.626 m)   Wt 130 lb 9.6 oz (59.2 kg)   BMI 22.42 kg/m  General:   Alert and oriented. No distress noted. Pleasant and cooperative.  Head:  Normocephalic and atraumatic. Eyes:  Conjuctiva clear without scleral icterus. Mouth:  Oral mucosa pink and moist. Good dentition. No lesions. Heart: Normal rate and rhythm, s1 and s2 heart sounds present.  Lungs: Clear lung sounds in all lobes. Respirations equal and  unlabored. Abdomen:  +BS, non tender, full but soft, non taut. No rebound or guarding. No HSM or masses noted. Derm: No palmar erythema or jaundice Msk:  Symmetrical without gross deformities. Normal posture. Extremities:  non pitting edema  Neurologic:  Alert and  oriented x4 Psych:  Alert and cooperative. Normal mood and affect.  Invalid input(s): "6 MONTHS"   ASSESSMENT: JEENA ARNETT is a 78 y.o. female presenting today for follow up of cirrhosis complicated by ascites and EVs   Cirrhosis: recent MELD 3.0 was 15. Decompensated given episodes of ascites. did not tolerate diuretics previously even at low dose. On exam today she does not appear grossly fluid overloaded though some mild non pitting edema to LEs, mild fluid present in abdomen, recent imaging with pleural effusion, ascites, anasarca, concerning for fluid overload. At this time, I am recommending resuming diuretic therapy with lasix  alone at 20mg  per day to see if she can tolerate this. If she is intolerable to diuretics and fluid retention persists, will need to discuss possible TIPS.   As she did not do labs recommended at last visit, will have her do TSH, INR, AFP tumor marker and repeat CMP 1 week after she resumes lasix .   She is due for EGD for EV screening as last was in June 2023, we will get her scheduled for this once cleared from cardiac standpoint. Indications, risks and benefits of procedure discussed in detail with patient. Patient verbalized understanding and is in agreement to proceed with EGD.     PLAN:  -Update TSH, INR, AFP tumor marker  -Resume lasix  20mg  daily for now -Repeat CMP 1 week after restarting lasix  -May need to consider referral for TIPS if fluid retention persists and cannot tolerate diurietics  -Continue lactulose  20g daily -Repeat EGD for EV screening ASA III, needs cards clearance/hold plavix  -next HCC screening via [place]  due Nov 2025 - Reduce salt intake to <2 g per day - Can take Tylenol   max of 2 g per  day (650 mg q8h) for pain - Avoid NSAIDs for pain - Avoid eating raw oysters/shellfish - Ensure every night before going to sleep  All questions were answered, patient verbalized understanding and is in agreement with plan as outlined above.   Follow Up: 6 weeks   Caela Huot L. Adrien Alberta, MSN, APRN, AGNP-C Adult-Gerontology Nurse Practitioner [facility]  I have reviewed the note and agree with the APP's assessment as described in this progress note  Samantha Cress, MD Gastroenterology and Hepatology [facility]

## 2023-08-17 NOTE — Telephone Encounter (Signed)
    08/17/23  Rebecca Hartman [birth year]  What type of surgery is being performed? EGD  When is surgery scheduled? TBD  What type of clearance is required (medical or pharmacy to hold medication or both? Medical   Are there any medications that need to be held prior to surgery and how long? Clearance to hold Plavix  x 5 days prior to procedure  Name of physician performing surgery?  Dr. Samantha Cress [facility]astroenter[facility] at [facility] Phone: [phone] Fax: [phone]  Anethesia type (none, local, MAC, general)? Choice

## 2023-08-17 NOTE — Patient Instructions (Addendum)
 We will get you scheduled for repeat EGD for your esophageal varices Please continue lactulose  at current dose As discussed, imaging shows fluid build up in your abdomen/lung, we need to restart some fluid pills to help with this As you did not tolerate well last time, please take the lasix  20mg  that you have once per day We need to repeat some labs in regards to your liver and electrolyte/kidney function, please do labs 1 week after restarting lasix    Follow up 6 weeks  It was a pleasure to see you today. I want to create trusting relationships with patients and provide genuine, compassionate, and quality care. I truly value your feedback! please be on the lookout for a survey regarding your visit with me today. I appreciate your input about our visit and your time in completing this!    Myrth Dahan L. Jonh Mcqueary, MSN, APRN, AGNP-C Adult-Gerontology Nurse Practitioner [facility]astroenter[facility] at [facility]

## 2023-08-18 ENCOUNTER — Telehealth (INDEPENDENT_AMBULATORY_CARE_PROVIDER_SITE_OTHER): Payer: Self-pay | Admitting: Gastroenterology

## 2023-08-18 NOTE — Telephone Encounter (Signed)
 I sent a secure chat to LPN Rozann Cornell, if procedure to be done before 10/2023. If so the pt will need a sooner appt in office for preop clearance.

## 2023-08-18 NOTE — Telephone Encounter (Signed)
 Tried calling the patient vm is full.

## 2023-08-18 NOTE — Telephone Encounter (Signed)
 Patient's husband came to front desk saying patient was seen yesterday and the pharmacy doesn't have the prescription of where her lasix  was being a lower dose. She uses [facility]. [phone]

## 2023-08-18 NOTE — Telephone Encounter (Signed)
 Chelsea, please see the note below.

## 2023-08-18 NOTE — Telephone Encounter (Signed)
 I tried both #'s that are on file for the pt but both vm are full and cannot leave message to call back and schedule sooner appt in office for preop clearance.   I did confirm with Gayle Kava, NP with the GI office that they would like to to get procedure done in June or July. 1st thing I can find is in the [place] office 10/13/23 with Lasalle Pointer, NP. Pt is generally followed in the [place] office. I have also let the GI office know that both #'s on file vm are full.

## 2023-08-19 NOTE — Telephone Encounter (Signed)
   Name: Rebecca Hartman  DOB: [birth year]  MRN: [id number]  Primary Cardiologist: Teddie Favre, MD  Chart reviewed as part of pre-operative protocol coverage. Because of [name] past medical history and time since last visit, she will require a follow-up in-office visit in order to better assess preoperative cardiovascular risk.  Please reach out to requesting office let them know the patient will require an in office visit for medical clearance.  Patient does have scheduled visit on 11/18/2023 where medical clearance can be given at that time.  However this is 3 months from now so if procedure needs to be completed prior to 11/18/2023 patient will need an office visit before then.   Pre-op covering staff: - Please schedule appointment and call patient to inform them. If patient already had an upcoming appointment within acceptable timeframe, please add "pre-op clearance" to the appointment notes so provider is aware. - Please contact requesting surgeon's office via preferred method (i.e, phone, fax) to inform them of need for appointment prior to surgery.  She may hold Plavix  for 5 days prior to procedure. Please resume Plavix  as soon as possible postprocedure, at the discretion of the surgeon.    Ava Boatman, NP  08/19/2023, 12:06 PM

## 2023-08-19 NOTE — Telephone Encounter (Signed)
 I tried calling the patient again, no answer, got vm, mail box is full unable to reach patient.

## 2023-08-19 NOTE — Telephone Encounter (Signed)
 I spoke with the patient husband Andree Kayser and made him aware the patient should have lasix  20 mg on hand. Patient spouse says his son manages his and his wife's medication and he will check with him and see if the patient needs a refill on Lasix , and he will let us  know if they need this sent in to the pharmacy.

## 2023-08-19 NOTE — Telephone Encounter (Signed)
 Pt has been scheduled sooner appt as needed for preop clearance. S/w GI office yesterday Gayle Kava, NP and Rozann Cornell, LPN. I did state pt has appt 11/18/23 with DR. Londa Rival, was the GI office trying to get procedure done sooner than 10/2023. NP states yes, if they could June or July. I did tell NP and LPN 1st thing we have 10/13/23 in the [place] office, though pt is generally seen in our [place] office. Pt did take the [place] appt 10/13/23 with Lasalle Pointer, NP for preop clearance. We did not cancel appt 11/18/23 with Dr. Londa Rival as this can be decided at appt 10/13/23. Pt then says not sure if her insurance will approve for her to go the [place] office. She tells me that they said she be seen in [place] and [place]. I advised the pt to be sure to check with hr insurance. If she has to change the 10/13/23 app then to call back and change appt or if she just wants to wait until 11/18/23 appt.    I assured the pt that I will update the GI office as well.

## 2023-08-25 ENCOUNTER — Telehealth: Payer: Self-pay

## 2023-08-25 NOTE — Progress Notes (Signed)
   08/25/2023  Patient ID: Rebecca Hartman, female   DOB: Sep 24, 1945, 78 y.o.   MRN: [id number]  Adherence Monitoring:  Up to date on meds, will review again in 1 week to confirm atorvastatin  fill. Documentation in [facility].   Flint Hummer, PharmD

## 2023-08-27 NOTE — Telephone Encounter (Signed)
 Thanks Sheryle Donning, if the patient prefers to be seen in [place], we can wait until August for her to see Dr. Londa Rival at the [place] office. Thanks, please keep us  posted.

## 2023-08-27 NOTE — Telephone Encounter (Signed)
 Thanks

## 2023-08-27 NOTE — Telephone Encounter (Signed)
 Thank you for your notes Dr. Sammi Crick, pt is agreeable to be seen sooner in the [place] office.

## 2023-08-28 DIAGNOSIS — Z79899 Other long term (current) drug therapy: Secondary | ICD-10-CM | POA: Diagnosis not present

## 2023-08-28 DIAGNOSIS — Z5181 Encounter for therapeutic drug level monitoring: Secondary | ICD-10-CM | POA: Diagnosis not present

## 2023-08-28 DIAGNOSIS — K7460 Unspecified cirrhosis of liver: Secondary | ICD-10-CM | POA: Diagnosis not present

## 2023-08-29 LAB — COMPREHENSIVE METABOLIC PANEL WITH GFR
ALT: 18 IU/L (ref 0–32)
AST: 36 IU/L (ref 0–40)
Albumin: 2.9 g/dL — ABNORMAL LOW (ref 3.8–4.8)
Alkaline Phosphatase: 100 IU/L (ref 44–121)
BUN/Creatinine Ratio: 11 — ABNORMAL LOW (ref 12–28)
BUN: 7 mg/dL — ABNORMAL LOW (ref 8–27)
Bilirubin Total: 1.5 mg/dL — ABNORMAL HIGH (ref 0.0–1.2)
CO2: 19 mmol/L — ABNORMAL LOW (ref 20–29)
Calcium: 8 mg/dL — ABNORMAL LOW (ref 8.7–10.3)
Chloride: 108 mmol/L — ABNORMAL HIGH (ref 96–106)
Creatinine, Ser: 0.62 mg/dL (ref 0.57–1.00)
Globulin, Total: 2.3 g/dL (ref 1.5–4.5)
Glucose: 106 mg/dL — ABNORMAL HIGH (ref 70–99)
Potassium: 3.7 mmol/L (ref 3.5–5.2)
Sodium: 139 mmol/L (ref 134–144)
Total Protein: 5.2 g/dL — ABNORMAL LOW (ref 6.0–8.5)
eGFR: 92 mL/min/{1.73_m2} (ref >59)

## 2023-08-29 LAB — PROTIME-INR
INR: 1.4 — ABNORMAL HIGH (ref 0.9–1.2)
Prothrombin Time: 15.1 s — ABNORMAL HIGH (ref 9.1–12.0)

## 2023-08-29 LAB — AFP TUMOR MARKER: AFP, Serum, Tumor Marker: 2.9 ng/mL (ref 0.0–9.2)

## 2023-08-29 LAB — TSH: TSH: 2.15 u[IU]/mL (ref 0.450–4.500)

## 2023-08-31 ENCOUNTER — Ambulatory Visit (INDEPENDENT_AMBULATORY_CARE_PROVIDER_SITE_OTHER): Payer: Self-pay | Admitting: Gastroenterology

## 2023-09-03 ENCOUNTER — Encounter: Payer: Self-pay | Admitting: Podiatry

## 2023-09-03 ENCOUNTER — Ambulatory Visit (INDEPENDENT_AMBULATORY_CARE_PROVIDER_SITE_OTHER): Admitting: Podiatry

## 2023-09-03 DIAGNOSIS — M79675 Pain in left toe(s): Secondary | ICD-10-CM

## 2023-09-03 DIAGNOSIS — E118 Type 2 diabetes mellitus with unspecified complications: Secondary | ICD-10-CM | POA: Diagnosis not present

## 2023-09-03 DIAGNOSIS — M79674 Pain in right toe(s): Secondary | ICD-10-CM

## 2023-09-03 DIAGNOSIS — B351 Tinea unguium: Secondary | ICD-10-CM | POA: Diagnosis not present

## 2023-09-03 NOTE — Progress Notes (Addendum)
 This patient presents to the office with chief complaint of long thick nails and diabetic feet.   This patient says there are long thick painful nails.  These nails are painful walking and wearing shoes.    Patient is unable to  self treat his own nails . This patient presents  to the office today for treatment of the  long nails and a foot evaluation due to history of  diabetes.  She has coagulation defect due to plavix .  General Appearance  Alert, conversant and in no acute stress.  Vascular  Dorsalis pedis and posterior tibial  pulses are palpable  bilaterally.  Capillary return is within normal limits  bilaterally. Temperature is within normal limits  bilaterally.  Neurologic  Senn-Weinstein monofilament wire test diminished  bilaterally. Muscle power within normal limits bilaterally.  Nails Thick disfigured discolored nails with subungual debris  from hallux to fifth toes bilaterally. No evidence of bacterial infection or drainage bilaterally.  Orthopedic  No limitations of motion of motion feet .  No crepitus or effusions noted.  No bony pathology or digital deformities noted.  HAV  B/L.  Skin  normotropic skin with no porokeratosis noted bilaterally.  No signs of infections or ulcers noted.     Onychomycosis  Diabetes with no foot complications  Debride nails x 10.  A diabetic foot exam was performed and there is no evidence of any vascular  pathology.  LOPS diminished.  RTC 3 months.   Ruffin Cotton DPM

## 2023-09-21 ENCOUNTER — Telehealth: Payer: Self-pay

## 2023-09-21 NOTE — Telephone Encounter (Signed)
 Up to date on meds, next review in July

## 2023-10-01 ENCOUNTER — Ambulatory Visit (INDEPENDENT_AMBULATORY_CARE_PROVIDER_SITE_OTHER): Admitting: Gastroenterology

## 2023-10-13 ENCOUNTER — Encounter: Payer: Self-pay | Admitting: Nurse Practitioner

## 2023-10-13 ENCOUNTER — Ambulatory Visit: Attending: Nurse Practitioner | Admitting: Nurse Practitioner

## 2023-10-13 ENCOUNTER — Other Ambulatory Visit: Payer: Self-pay | Admitting: Nurse Practitioner

## 2023-10-13 ENCOUNTER — Ambulatory Visit: Attending: Nurse Practitioner

## 2023-10-13 ENCOUNTER — Telehealth: Payer: Self-pay | Admitting: Internal Medicine

## 2023-10-13 VITALS — BP 140/70 | HR 63 | Ht 62.0 in | Wt 125.4 lb

## 2023-10-13 DIAGNOSIS — Z01810 Encounter for preprocedural cardiovascular examination: Secondary | ICD-10-CM | POA: Diagnosis not present

## 2023-10-13 DIAGNOSIS — I2510 Atherosclerotic heart disease of native coronary artery without angina pectoris: Secondary | ICD-10-CM

## 2023-10-13 DIAGNOSIS — R5383 Other fatigue: Secondary | ICD-10-CM

## 2023-10-13 DIAGNOSIS — I10 Essential (primary) hypertension: Secondary | ICD-10-CM

## 2023-10-13 DIAGNOSIS — I351 Nonrheumatic aortic (valve) insufficiency: Secondary | ICD-10-CM

## 2023-10-13 DIAGNOSIS — R011 Cardiac murmur, unspecified: Secondary | ICD-10-CM | POA: Diagnosis not present

## 2023-10-13 DIAGNOSIS — R5382 Chronic fatigue, unspecified: Secondary | ICD-10-CM

## 2023-10-13 NOTE — Telephone Encounter (Signed)
 Checking percert on the following    LONG TERM MONITOR XT (3-14 DAYS)

## 2023-10-13 NOTE — Patient Instructions (Addendum)
 Medication Instructions:  Your physician recommends that you continue on your current medications as directed. Please refer to the Current Medication list given to you today.  Labwork: In 1 week at [facility]   Testing/Procedures: Your physician has recommended that you wear a Zio monitor.   This monitor is a medical device that records the heart's electrical activity. Doctors most often use these monitors to diagnose arrhythmias. Arrhythmias are problems with the speed or rhythm of the heartbeat. The monitor is a small device applied to your chest. You can wear one while you do your normal daily activities. While wearing this monitor if you have any symptoms to push the button and record what you felt. Once you have worn this monitor for the period of time provider prescribed (for 7 days), you will return the monitor device in the postage paid box. Once it is returned they will download the data collected and provide us  with a report which the provider will then review and we will call you with those results. Important tips:  Avoid showering during the first 24 hours of wearing the monitor. Avoid excessive sweating to help maximize wear time. Do not submerge the device, no hot tubs, and no swimming pools. Keep any lotions or oils away from the patch. After 24 hours you may shower with the patch on. Take brief showers with your back facing the shower head.  Do not remove patch once it has been placed because that will interrupt data and decrease adhesive wear time. Push the button when you have any symptoms and write down what you were feeling. Once you have completed wearing your monitor, remove and place into box which has postage paid and place in your outgoing mailbox.  If for some reason you have misplaced your box then call our office and we can provide another box and/or mail it off for you. Your physician has requested that you have an echocardiogram. Echocardiography is a painless  test that uses sound waves to create images of your heart. It provides your doctor with information about the size and shape of your heart and how well your heart's chambers and valves are working. This procedure takes approximately one hour. There are no restrictions for this procedure. Please do NOT wear cologne, perfume, aftershave, or lotions (deodorant is allowed). Please arrive 15 minutes prior to your appointment time.  Please note: We ask at that you not bring children with you during ultrasound (echo/ vascular) testing. Due to room size and safety concerns, children are not allowed in the ultrasound rooms during exams. Our front office staff cannot provide observation of children in our lobby area while testing is being conducted. An adult accompanying a patient to their appointment will only be allowed in the ultrasound room at the discretion of the ultrasound technician under special circumstances. We apologize for any inconvenience.  Follow-Up: Your physician recommends that you schedule a follow-up appointment in: 6 weeks   Any Other Special Instructions Will Be Listed Below (If Applicable).  If you need a refill on your cardiac medications before your next appointment, please call your pharmacy.

## 2023-10-13 NOTE — Progress Notes (Unsigned)
 Cardiology Office Note   Date:  10/13/2023 ID:  Rebecca Hartman, DOB 03-22-1946, MRN [id number] PCP: [name], MD  [facility] Providers Cardiologist:  Jayson Sierras, MD     History of Present Illness Rebecca Hartman is a 78 y.o. female with a PMH of CAD, s./p DES to mLAD in 2022, murmur, T2DM, hypercholesterolemia, hypothyroidism, osteoporosis, fibromyalgia, hypertension, chronic fatigue, chronic back pain, anxiety, who presents today for preoperative cardiovascular risk assessment.  Last seen by Dr. Sierras on April 10, 2022. Was overall doing well at the time.   Today she presents for pre-operative cardiovascular risk assessment. She is pending EGD. She states she has not been doing very good per her report.  Says she has no energy and feels very tired and lacks the desire to do activities she used to do/enjoy in the past.  For example, her spouse says she no longer enjoys traveling.  Also does not participate in church activities like she used to in the past. Denies any chest pain, shortness of breath, palpitations, syncope, presyncope, dizziness, orthopnea, PND, swelling or significant weight changes, acute bleeding, or claudication.  Husband says she used to see [facility] Barefoot and receive iron transfusions and felt better with this.  ROS: Negative.  See HPI.  Studies Reviewed EKG Interpretation Date/Time:  Tuesday October 13 2023 13:37:51 EDT Ventricular Rate:  63 PR Interval:  180 QRS Duration:  90 QT Interval:  470 QTC Calculation: 480 R Axis:   68  Text Interpretation: Unusual P axis, possible ectopic atrial rhythm When compared with ECG of 11-Sep-2021 10:40, Ectopic atrial rhythm has replaced Sinus rhythm Questionable change in QRS axis T wave inversion no longer evident in Inferior leads Nonspecific T wave abnormality no longer evident in Anterior leads T wave amplitude has decreased in Lateral leads Confirmed by Miriam Norris [phone]) on 10/13/2023 1:58:42 PM    EKG:  EKG Interpretation Date/Time:  Tuesday October 13 2023 13:37:51 EDT Ventricular Rate:  63 PR Interval:  180 QRS Duration:  90 QT Interval:  470 QTC Calculation: 480 R Axis:   68  Text Interpretation: Unusual P axis, possible ectopic atrial rhythm When compared with ECG of 11-Sep-2021 10:40, Ectopic atrial rhythm has replaced Sinus rhythm Questionable change in QRS axis T wave inversion no longer evident in Inferior leads Nonspecific T wave abnormality no longer evident in Anterior leads T wave amplitude has decreased in Lateral leads Confirmed by Miriam Norris [phone]) on 10/13/2023 1:58:42 PM   Echo 03/2022:  1. Left ventricular ejection fraction, by estimation, is 60 to 65%. The  left ventricle has normal function. The left ventricle has no regional  wall motion abnormalities. There is mild left ventricular hypertrophy.  Left ventricular diastolic parameters  are consistent with Grade I diastolic dysfunction (impaired relaxation).  Elevated left atrial pressure.   2. Right ventricular systolic function is normal. The right ventricular  size is normal. There is normal pulmonary artery systolic pressure.   3. Left atrial size was severely dilated.   4. The mitral valve is abnormal. Mild mitral valve regurgitation. No  evidence of mitral stenosis.   5. The tricuspid valve is abnormal.   6. The aortic valve is tricuspid. There is mild calcification of the  aortic valve. There is mild thickening of the aortic valve. Aortic valve  regurgitation is moderate. No aortic stenosis is present.  LHC 05/2020:  Mid RCA lesion is 60% stenosed. Ost Cx to Prox Cx lesion is 65% stenosed. Dist Cx lesion  is 60% stenosed. Mid LAD lesion is 90% stenosed. A drug-eluting stent was successfully placed using a STENT RESOLUTE ONYX 3.0X18. Post intervention, there is a 0% residual stenosis.   1. Severe stenosis mid LAD 2. Successful OCT guided PCI/DES x 1 mid LAD 3. Moderate, heavily calcified ostial  Circumflex stenosis.  4. Moderate mid RCA stenosis   Recommendations: Will continue DAPT with ASA and Plavix  for at least six months. Will change Pravachol to Lipitor. Continue beta blocker. Medical management of moderate ostial Circumflex and moderate mid RCA disease.    Lexiscan  01/2014:  IMPRESSION: 1. Study consistent with mid to apical anteroseptal ischemia as detailed above.   2. Normal left ventricular wall motion.   3. Left ventricular ejection fraction 72%   4. Intermediate-risk stress test findings*.  Physical Exam VS:  BP (!) 140/70   Pulse 63   Ht 5' 2 (1.575 m)   Wt 125 lb 6.4 oz (56.9 kg)   SpO2 98%   BMI 22.94 kg/m        Wt Readings from Last 3 Encounters:  10/13/23 125 lb 6.4 oz (56.9 kg)  08/17/23 130 lb 9.6 oz (59.2 kg)  08/04/23 129 lb (58.5 kg)    GEN: Well nourished, well developed in no acute distress NECK: No JVD; No carotid bruits CARDIAC: S1/S2, RRR, Grade 2/6 murmur, no rubs, no gallops RESPIRATORY:  Clear to auscultation without rales, wheezing or rhonchi  ABDOMEN: Soft, non-tender, non-distended EXTREMITIES:  No edema; No deformity   ASSESSMENT AND PLAN  Pre-operative cardiovascular risk assessment Cardiac workup as noted below. If workup is WNL, will proceed with clearance at next office visit. Pt verbalizes understanding and is agreeable to this plan.   CAD Stable with no anginal symptoms. No indication for ischemic evaluation. Continue current medication regimen. Heart healthy diet and regular cardiovascular exercise encouraged. Care and ED precautions dicussed. No medication changes at this time.   HTN BP borderline elevated. Discussed to monitor BP at home at least 2 hours after medications and sitting for 5-10 minutes. Discussed to notify office if SBP consistently > 140. Due to symptoms, no medication changes at this time. Heart healthy diet and regular cardiovascular exercise encouraged.   Chronic fatigue Etiology  multifactorial. Will arrange monitor for further evaluation to make sure there are no significant arrhythmias causing her symptoms and arrange Echo - see below. Will also obtain CBC, B12, folate, and iron. Continue to follow with PCP.   AR, murmur Echocardiogram in January 2024 revealed normal LVEF with moderate AR.  Will update echocardiogram at this time-see above.  Dispo: Care and ED precautions discussed.  Follow-up with MD/APP in 6 weeks or sooner for any changes.  Signed, Almarie Crate, NP

## 2023-10-23 DIAGNOSIS — R011 Cardiac murmur, unspecified: Secondary | ICD-10-CM | POA: Diagnosis not present

## 2023-10-26 ENCOUNTER — Other Ambulatory Visit: Payer: Self-pay | Admitting: Cardiology

## 2023-10-26 DIAGNOSIS — R011 Cardiac murmur, unspecified: Secondary | ICD-10-CM

## 2023-10-27 ENCOUNTER — Ambulatory Visit

## 2023-11-18 ENCOUNTER — Ambulatory Visit: Admitting: Cardiology

## 2023-11-18 ENCOUNTER — Ambulatory Visit: Attending: Cardiology

## 2023-11-18 ENCOUNTER — Other Ambulatory Visit

## 2023-11-18 DIAGNOSIS — R011 Cardiac murmur, unspecified: Secondary | ICD-10-CM | POA: Diagnosis not present

## 2023-11-19 ENCOUNTER — Ambulatory Visit: Admitting: Cardiology

## 2023-11-19 LAB — ECHOCARDIOGRAM COMPLETE
AR max vel: 2.62 cm2
AV Area VTI: 2.59 cm2
AV Area mean vel: 2.23 cm2
AV Mean grad: 8 mmHg
AV Peak grad: 14.3 mmHg
AV Vena cont: 0.6 cm
Ao pk vel: 1.89 m/s
Area-P 1/2: 3.16 cm2
Calc EF: 66 %
P 1/2 time: 586 ms
S' Lateral: 3.3 cm
Single Plane A2C EF: 68.8 %
Single Plane A4C EF: 63.5 %

## 2023-11-26 ENCOUNTER — Encounter: Payer: Self-pay | Admitting: Nurse Practitioner

## 2023-11-26 ENCOUNTER — Ambulatory Visit: Attending: Nurse Practitioner | Admitting: Nurse Practitioner

## 2023-11-26 VITALS — BP 150/81 | HR 62 | Ht 63.0 in | Wt 124.0 lb

## 2023-11-26 DIAGNOSIS — I351 Nonrheumatic aortic (valve) insufficiency: Secondary | ICD-10-CM

## 2023-11-26 DIAGNOSIS — Z01810 Encounter for preprocedural cardiovascular examination: Secondary | ICD-10-CM | POA: Diagnosis not present

## 2023-11-26 DIAGNOSIS — I2510 Atherosclerotic heart disease of native coronary artery without angina pectoris: Secondary | ICD-10-CM

## 2023-11-26 DIAGNOSIS — R011 Cardiac murmur, unspecified: Secondary | ICD-10-CM | POA: Diagnosis not present

## 2023-11-26 DIAGNOSIS — R5382 Chronic fatigue, unspecified: Secondary | ICD-10-CM | POA: Diagnosis not present

## 2023-11-26 DIAGNOSIS — I10 Essential (primary) hypertension: Secondary | ICD-10-CM | POA: Diagnosis not present

## 2023-11-26 DIAGNOSIS — R040 Epistaxis: Secondary | ICD-10-CM | POA: Diagnosis not present

## 2023-11-26 NOTE — Progress Notes (Addendum)
 Cardiology Office Note   Date:  11/26/2023 ID:  Rebecca, Hartman May 24, 1945, MRN [id number] PCP: [name], MD  [facility] Providers Cardiologist:  Jayson Sierras, MD     History of Present Illness Rebecca Hartman is a 78 y.o. female with a PMH of CAD, s./p DES to mLAD in 2022, murmur, T2DM, hypercholesterolemia, hypothyroidism, osteoporosis, fibromyalgia, hypertension, chronic fatigue, chronic back pain, anxiety, who presents today for preoperative cardiovascular risk assessment.  Last seen by Dr. Sierras on April 10, 2022. Was overall doing well at the time.   10/13/2023 - Today she presents for pre-operative cardiovascular risk assessment. She is pending EGD. She states she has not been doing very good per her report.  Says she has no energy and feels very tired and lacks the desire to do activities she used to do/enjoy in the past.  For example, her spouse says she no longer enjoys traveling.  Also does not participate in church activities like she used to in the past. Denies any chest pain, shortness of breath, palpitations, syncope, presyncope, dizziness, orthopnea, PND, swelling or significant weight changes, acute bleeding, or claudication.  Husband says she used to see [facility] Barefoot and receive iron transfusions and felt better with this.   11/26/2023 - Here for follow-up with her husband. Tells me she notices frequent nosebleeds, wonders if it is related to the weather. Overall doing well from a cardiac standpoint. Denies any chest pain, shortness of breath, palpitations, syncope, presyncope, dizziness, orthopnea, PND, swelling or significant weight changes, acute bleeding, or claudication.   ROS: Negative.  See HPI.  Studies Reviewed     EKG: EKG is not ordered today.   Echo 10/2023:  1. Left ventricular ejection fraction, by estimation, is 60 to 65%. The  left ventricle has normal function. The left ventricle has no regional  wall motion abnormalities.  There is mild left ventricular hypertrophy.  Left ventricular diastolic parameters  are consistent with Grade II diastolic dysfunction (pseudonormalization).  Elevated left atrial pressure.   2. Right ventricular systolic function is normal. The right ventricular  size is normal. There is mildly elevated pulmonary artery systolic  pressure.   3. Left atrial size was moderately dilated.   4. The mitral valve is abnormal. Mild to moderate mitral valve  regurgitation. No evidence of mitral stenosis.   5. The tricuspid valve is abnormal.   6. The aortic valve is tricuspid. Aortic valve regurgitation is moderate.  No aortic stenosis is present.   7. The inferior vena cava is normal in size with greater than 50%  respiratory variability, suggesting right atrial pressure of 3 mmHg.   Comparison(s): A prior study was performed on 04/17/2022. EF 60-65%. Mild  MR. Moderate AI.   Cardiac monitor 09/2023:  ZIO monitor reviewed.  6 days, 16 hours analyzed.   Predominant rhythm is sinus with heart rate ranging from 46 bpm up to 74 bpm and average heart rate 61 bpm. There were rare PACs including atrial couplets and triplets representing less than 1% total beats. There were rare PVCs including ventricular couplets representing less than 1% total beats. Eight brief episodes of PSVT were noted, the longest of which was 10 beats.  No sustained arrhythmias. No pauses or high degree heart block.  Echo 03/2022:  1. Left ventricular ejection fraction, by estimation, is 60 to 65%. The  left ventricle has normal function. The left ventricle has no regional  wall motion abnormalities. There is mild left ventricular hypertrophy.  Left ventricular diastolic parameters  are consistent with Grade I diastolic dysfunction (impaired relaxation).  Elevated left atrial pressure.   2. Right ventricular systolic function is normal. The right ventricular  size is normal. There is normal pulmonary artery systolic  pressure.   3. Left atrial size was severely dilated.   4. The mitral valve is abnormal. Mild mitral valve regurgitation. No  evidence of mitral stenosis.   5. The tricuspid valve is abnormal.   6. The aortic valve is tricuspid. There is mild calcification of the  aortic valve. There is mild thickening of the aortic valve. Aortic valve  regurgitation is moderate. No aortic stenosis is present.  LHC 05/2020:  Mid RCA lesion is 60% stenosed. Ost Cx to Prox Cx lesion is 65% stenosed. Dist Cx lesion is 60% stenosed. Mid LAD lesion is 90% stenosed. A drug-eluting stent was successfully placed using a STENT RESOLUTE ONYX 3.0X18. Post intervention, there is a 0% residual stenosis.   1. Severe stenosis mid LAD 2. Successful OCT guided PCI/DES x 1 mid LAD 3. Moderate, heavily calcified ostial Circumflex stenosis.  4. Moderate mid RCA stenosis   Recommendations: Will continue DAPT with ASA and Plavix  for at least six months. Will change Pravachol to Lipitor. Continue beta blocker. Medical management of moderate ostial Circumflex and moderate mid RCA disease.    Lexiscan  01/2014:  IMPRESSION: 1. Study consistent with mid to apical anteroseptal ischemia as detailed above.   2. Normal left ventricular wall motion.   3. Left ventricular ejection fraction 72%   4. Intermediate-risk stress test findings*.  Physical Exam VS:  BP (!) 150/81   Pulse 62   Ht 5' 3 (1.6 m)   Wt 124 lb (56.2 kg)   SpO2 97%   BMI 21.97 kg/m        Wt Readings from Last 3 Encounters:  11/26/23 124 lb (56.2 kg)  10/13/23 125 lb 6.4 oz (56.9 kg)  08/17/23 130 lb 9.6 oz (59.2 kg)    GEN: Well nourished, well developed in no acute distress NECK: No JVD; No carotid bruits CARDIAC: S1/S2, RRR, Grade 2/6 murmur, no rubs, no gallops RESPIRATORY:  Clear to auscultation without rales, wheezing or rhonchi  ABDOMEN: Soft, non-tender, non-distended EXTREMITIES:  No edema; No deformity   ASSESSMENT AND  PLAN  Pre-operative cardiovascular risk assessment [name]'s perioperative risk of a major cardiac event is 0.4% according to the Revised Cardiac Risk Index (RCRI).  Therefore, she is at low risk for perioperative complications.   Her functional capacity is fair at 4.64 METs according to the Duke Activity Status Index (DASI). Recommendations: According to ACC/AHA guidelines, no further cardiovascular testing needed.  The patient may proceed to surgery at acceptable risk.   Antiplatelet and/or Anticoagulation Recommendations: Stopping Plavix  today and she is not on any other antiplatelet or anticoagulation medication that needs to be held prior to procedure. Will send recs to requesting party.   CAD Stable with no anginal symptoms. No indication for ischemic evaluation. Continue current medication regimen. Heart healthy diet and regular cardiovascular exercise encouraged. Care and ED precautions dicussed. No medication changes at this time. Stopping Plavix  d/t frequent nose bleeds and since it has been over 6 months since [facility].   HTN BP elevated. Most likely d/t [facility]. Discussed to monitor BP at home at least 2 hours after medications and sitting for 5-10 minutes. Discussed to notify office if SBP consistently > 140. Due to symptoms, no medication changes at this time. Heart healthy diet  and regular cardiovascular exercise encouraged.   Chronic fatigue, frequent nosebleeds Etiology multifactorial. Will stop Plavix . Will also obtain CBC, B12, folate, and iron. If labs are stable, will start Aspirin  81 mg daily. Continue to follow with PCP.  AR, murmur Recent Echo revealed normal LVEF with moderate AR.  Plan to update Echo in 1-2 years or sooner if clinically indicated.   I spent a total duration of 30 minutes reviewing prior notes, reviewing outside records including  labs, face-to-face counseling of medical condition, pathophysiology, evaluation, management, and documenting the findings in the  note.   Dispo: Care and ED precautions discussed.  Follow-up with MD/APP in 6 months or sooner for any changes.  Signed, Almarie Crate, NP

## 2023-11-26 NOTE — Addendum Note (Signed)
 Addended by: Howard Bunte on: 11/26/2023 02:59 PM   Modules accepted: Orders

## 2023-11-26 NOTE — Patient Instructions (Addendum)
 Medication Instructions:  Your physician has recommended you make the following change in your medication:  Please stop Plavix    Labwork: Within the next week at [facility]   Testing/Procedures: None   Follow-Up: Your physician recommends that you schedule a follow-up appointment in: 6 months   Any Other Special Instructions Will Be Listed Below (If Applicable).  If you need a refill on your cardiac medications before your next appointment, please call your pharmacy.

## 2023-11-26 NOTE — Addendum Note (Signed)
 Addended by: Suhaan Perleberg on: 11/26/2023 03:00 PM   Modules accepted: Orders

## 2023-11-27 DIAGNOSIS — R5382 Chronic fatigue, unspecified: Secondary | ICD-10-CM | POA: Diagnosis not present

## 2023-11-28 LAB — B12 AND FOLATE PANEL
Folate: 14.6 ng/mL (ref >3.0)
Vitamin B-12: 1980 pg/mL — ABNORMAL HIGH (ref 232–1245)

## 2023-11-28 LAB — CBC
Hematocrit: 32.4 % — ABNORMAL LOW (ref 34.0–46.6)
Hemoglobin: 11.5 g/dL (ref 11.1–15.9)
MCH: 34.2 pg — ABNORMAL HIGH (ref 26.6–33.0)
MCHC: 35.5 g/dL (ref 31.5–35.7)
MCV: 96 fL (ref 79–97)
Platelets: 86 x10E3/uL — CL (ref 150–450)
RBC: 3.36 x10E6/uL — ABNORMAL LOW (ref 3.77–5.28)
RDW: 13.8 % (ref 11.7–15.4)
WBC: 6.3 x10E3/uL (ref 3.4–10.8)

## 2023-11-28 LAB — IRON: Iron: 153 ug/dL — ABNORMAL HIGH (ref 27–139)

## 2023-12-01 ENCOUNTER — Ambulatory Visit: Payer: Self-pay | Admitting: Nurse Practitioner

## 2023-12-01 DIAGNOSIS — D696 Thrombocytopenia, unspecified: Secondary | ICD-10-CM

## 2023-12-01 NOTE — Telephone Encounter (Signed)
 See cardiology OV 8/28 notes for clearance

## 2023-12-02 NOTE — Telephone Encounter (Signed)
 Called pt to get her scheduled, advised by family member that pt is still in the bed asleep. Will have pt give office a call when she gets up

## 2023-12-02 NOTE — Telephone Encounter (Signed)
 Spoke with patient, scheduled egd for 12/15/2023 at 9:15am. Patient aware to hold iron and jardiance , also aware instructions are coming through the mail.

## 2023-12-03 ENCOUNTER — Ambulatory Visit: Admitting: Podiatry

## 2023-12-03 NOTE — Telephone Encounter (Signed)
 Called patient to give pre-op appointment, no answer. Letter mailed.

## 2023-12-08 ENCOUNTER — Inpatient Hospital Stay: Attending: Hematology

## 2023-12-08 DIAGNOSIS — E538 Deficiency of other specified B group vitamins: Secondary | ICD-10-CM | POA: Diagnosis not present

## 2023-12-08 DIAGNOSIS — N182 Chronic kidney disease, stage 2 (mild): Secondary | ICD-10-CM | POA: Insufficient documentation

## 2023-12-08 DIAGNOSIS — E611 Iron deficiency: Secondary | ICD-10-CM

## 2023-12-08 DIAGNOSIS — R7402 Elevation of levels of lactic acid dehydrogenase (LDH): Secondary | ICD-10-CM | POA: Diagnosis not present

## 2023-12-08 DIAGNOSIS — Z8042 Family history of malignant neoplasm of prostate: Secondary | ICD-10-CM | POA: Insufficient documentation

## 2023-12-08 DIAGNOSIS — D72819 Decreased white blood cell count, unspecified: Secondary | ICD-10-CM | POA: Diagnosis not present

## 2023-12-08 DIAGNOSIS — K7460 Unspecified cirrhosis of liver: Secondary | ICD-10-CM | POA: Insufficient documentation

## 2023-12-08 DIAGNOSIS — D631 Anemia in chronic kidney disease: Secondary | ICD-10-CM | POA: Diagnosis not present

## 2023-12-08 DIAGNOSIS — L4050 Arthropathic psoriasis, unspecified: Secondary | ICD-10-CM | POA: Insufficient documentation

## 2023-12-08 DIAGNOSIS — D61818 Other pancytopenia: Secondary | ICD-10-CM

## 2023-12-08 DIAGNOSIS — R7989 Other specified abnormal findings of blood chemistry: Secondary | ICD-10-CM | POA: Insufficient documentation

## 2023-12-08 DIAGNOSIS — D509 Iron deficiency anemia, unspecified: Secondary | ICD-10-CM | POA: Insufficient documentation

## 2023-12-08 DIAGNOSIS — D696 Thrombocytopenia, unspecified: Secondary | ICD-10-CM | POA: Diagnosis not present

## 2023-12-08 DIAGNOSIS — R040 Epistaxis: Secondary | ICD-10-CM | POA: Diagnosis not present

## 2023-12-08 LAB — IRON AND TIBC
Iron: 126 ug/dL (ref 28–170)
Saturation Ratios: 62 % — ABNORMAL HIGH (ref 10.4–31.8)
TIBC: 202 ug/dL — ABNORMAL LOW (ref 250–450)
UIBC: 76 ug/dL

## 2023-12-08 LAB — CBC WITH DIFFERENTIAL/PLATELET
Abs Immature Granulocytes: 0.02 K/uL (ref 0.00–0.07)
Basophils Absolute: 0.1 K/uL (ref 0.0–0.1)
Basophils Relative: 1 %
Eosinophils Absolute: 0.3 K/uL (ref 0.0–0.5)
Eosinophils Relative: 4 %
HCT: 37.9 % (ref 36.0–46.0)
Hemoglobin: 13 g/dL (ref 12.0–15.0)
Immature Granulocytes: 0 %
Lymphocytes Relative: 14 %
Lymphs Abs: 1 K/uL (ref 0.7–4.0)
MCH: 34.7 pg — ABNORMAL HIGH (ref 26.0–34.0)
MCHC: 34.3 g/dL (ref 30.0–36.0)
MCV: 101.1 fL — ABNORMAL HIGH (ref 80.0–100.0)
Monocytes Absolute: 0.6 K/uL (ref 0.1–1.0)
Monocytes Relative: 8 %
Neutro Abs: 5.5 K/uL (ref 1.7–7.7)
Neutrophils Relative %: 73 %
Platelets: 100 K/uL — ABNORMAL LOW (ref 150–400)
RBC: 3.75 MIL/uL — ABNORMAL LOW (ref 3.87–5.11)
RDW: 16.6 % — ABNORMAL HIGH (ref 11.5–15.5)
WBC: 7.6 K/uL (ref 4.0–10.5)
nRBC: 0 % (ref 0.0–0.2)

## 2023-12-08 LAB — COMPREHENSIVE METABOLIC PANEL WITH GFR
ALT: 13 U/L (ref 0–44)
AST: 28 U/L (ref 15–41)
Albumin: 2.6 g/dL — ABNORMAL LOW (ref 3.5–5.0)
Alkaline Phosphatase: 86 U/L (ref 38–126)
Anion gap: 7 (ref 5–15)
BUN: 7 mg/dL — ABNORMAL LOW (ref 8–23)
CO2: 20 mmol/L — ABNORMAL LOW (ref 22–32)
Calcium: 8.3 mg/dL — ABNORMAL LOW (ref 8.9–10.3)
Chloride: 112 mmol/L — ABNORMAL HIGH (ref 98–111)
Creatinine, Ser: 0.69 mg/dL (ref 0.44–1.00)
GFR, Estimated: >60 mL/min (ref >60)
Glucose, Bld: 158 mg/dL — ABNORMAL HIGH (ref 70–99)
Potassium: 3 mmol/L — ABNORMAL LOW (ref 3.5–5.1)
Sodium: 139 mmol/L (ref 135–145)
Total Bilirubin: 2.3 mg/dL — ABNORMAL HIGH (ref 0.0–1.2)
Total Protein: 5.8 g/dL — ABNORMAL LOW (ref 6.5–8.1)

## 2023-12-08 LAB — FERRITIN: Ferritin: 165 ng/mL (ref 11–307)

## 2023-12-08 LAB — VITAMIN B12: Vitamin B-12: 1181 pg/mL — ABNORMAL HIGH (ref 180–914)

## 2023-12-10 ENCOUNTER — Encounter (HOSPITAL_COMMUNITY)
Admission: RE | Admit: 2023-12-10 | Discharge: 2023-12-10 | Disposition: A | Discharge status: Home / Self Care | Source: Ambulatory Visit | Attending: Gastroenterology | Admitting: Gastroenterology

## 2023-12-13 LAB — METHYLMALONIC ACID, SERUM: Methylmalonic Acid, Quantitative: 203 nmol/L (ref 0–378)

## 2023-12-15 ENCOUNTER — Encounter (HOSPITAL_COMMUNITY)
Admission: RE | Disposition: A | Discharge status: Home / Self Care | Payer: Self-pay | Source: Home / Self Care | Attending: Gastroenterology

## 2023-12-15 ENCOUNTER — Encounter (HOSPITAL_COMMUNITY): Payer: Self-pay | Admitting: Gastroenterology

## 2023-12-15 ENCOUNTER — Ambulatory Visit (HOSPITAL_COMMUNITY)
Admission: RE | Admit: 2023-12-15 | Discharge: 2023-12-15 | Disposition: A | Discharge status: Home / Self Care | Attending: Gastroenterology | Admitting: Gastroenterology

## 2023-12-15 ENCOUNTER — Ambulatory Visit: Admitting: Physician Assistant

## 2023-12-15 ENCOUNTER — Ambulatory Visit (HOSPITAL_BASED_OUTPATIENT_CLINIC_OR_DEPARTMENT_OTHER): Admitting: Anesthesiology

## 2023-12-15 ENCOUNTER — Ambulatory Visit (HOSPITAL_COMMUNITY): Admitting: Anesthesiology

## 2023-12-15 DIAGNOSIS — E039 Hypothyroidism, unspecified: Secondary | ICD-10-CM | POA: Insufficient documentation

## 2023-12-15 DIAGNOSIS — I2510 Atherosclerotic heart disease of native coronary artery without angina pectoris: Secondary | ICD-10-CM | POA: Diagnosis not present

## 2023-12-15 DIAGNOSIS — K449 Diaphragmatic hernia without obstruction or gangrene: Secondary | ICD-10-CM

## 2023-12-15 DIAGNOSIS — K7581 Nonalcoholic steatohepatitis (NASH): Secondary | ICD-10-CM | POA: Insufficient documentation

## 2023-12-15 DIAGNOSIS — I8500 Esophageal varices without bleeding: Secondary | ICD-10-CM | POA: Insufficient documentation

## 2023-12-15 DIAGNOSIS — I8510 Secondary esophageal varices without bleeding: Secondary | ICD-10-CM | POA: Diagnosis present

## 2023-12-15 DIAGNOSIS — I10 Essential (primary) hypertension: Secondary | ICD-10-CM

## 2023-12-15 DIAGNOSIS — F419 Anxiety disorder, unspecified: Secondary | ICD-10-CM | POA: Diagnosis not present

## 2023-12-15 DIAGNOSIS — E7800 Pure hypercholesterolemia, unspecified: Secondary | ICD-10-CM | POA: Insufficient documentation

## 2023-12-15 DIAGNOSIS — F0390 Unspecified dementia without behavioral disturbance: Secondary | ICD-10-CM | POA: Insufficient documentation

## 2023-12-15 DIAGNOSIS — M797 Fibromyalgia: Secondary | ICD-10-CM | POA: Diagnosis not present

## 2023-12-15 DIAGNOSIS — M810 Age-related osteoporosis without current pathological fracture: Secondary | ICD-10-CM | POA: Insufficient documentation

## 2023-12-15 DIAGNOSIS — L4050 Arthropathic psoriasis, unspecified: Secondary | ICD-10-CM | POA: Insufficient documentation

## 2023-12-15 DIAGNOSIS — K3189 Other diseases of stomach and duodenum: Secondary | ICD-10-CM

## 2023-12-15 DIAGNOSIS — E119 Type 2 diabetes mellitus without complications: Secondary | ICD-10-CM | POA: Insufficient documentation

## 2023-12-15 DIAGNOSIS — Z7902 Long term (current) use of antithrombotics/antiplatelets: Secondary | ICD-10-CM | POA: Diagnosis not present

## 2023-12-15 DIAGNOSIS — Z7984 Long term (current) use of oral hypoglycemic drugs: Secondary | ICD-10-CM | POA: Diagnosis not present

## 2023-12-15 DIAGNOSIS — Z79890 Hormone replacement therapy (postmenopausal): Secondary | ICD-10-CM | POA: Insufficient documentation

## 2023-12-15 HISTORY — PX: ESOPHAGOGASTRODUODENOSCOPY: SHX5428

## 2023-12-15 LAB — GLUCOSE, CAPILLARY: Glucose-Capillary: 76 mg/dL (ref 70–99)

## 2023-12-15 SURGERY — EGD (ESOPHAGOGASTRODUODENOSCOPY)
Anesthesia: Monitor Anesthesia Care

## 2023-12-15 MED ORDER — PROPOFOL 10 MG/ML IV BOLUS
INTRAVENOUS | Status: AC
  Filled 2023-12-15: qty 20

## 2023-12-15 MED ORDER — LACTATED RINGERS IV SOLN: INTRAVENOUS | Status: DC

## 2023-12-15 MED ORDER — PROPOFOL 10 MG/ML IV BOLUS
INTRAVENOUS | Status: DC | PRN
Start: 2023-12-15 — End: 2023-12-15
  Administered 2023-12-15: 100 mg via INTRAVENOUS

## 2023-12-15 MED ORDER — GLYCOPYRROLATE PF 0.2 MG/ML IJ SOSY
PREFILLED_SYRINGE | INTRAMUSCULAR | Status: AC
  Filled 2023-12-15: qty 1

## 2023-12-15 MED ORDER — GLYCOPYRROLATE 0.2 MG/ML IJ SOLN
INTRAMUSCULAR | Status: DC | PRN
Start: 2023-12-15 — End: 2023-12-15
  Administered 2023-12-15: .1 mg via INTRAVENOUS

## 2023-12-15 NOTE — Transfer of Care (Signed)
 Immediate Anesthesia Transfer of Care Note  Patient: Rebecca Hartman  Procedure(s) Performed: EGD (ESOPHAGOGASTRODUODENOSCOPY)  Patient Location: PACU  Anesthesia Type:MAC  Level of Consciousness: drowsy  Airway & Oxygen Therapy: Patient Spontanous Breathing  Post-op Assessment: Report given to RN, Post -op Vital signs reviewed and stable, and Patient moving all extremities  Post vital signs: Reviewed and stable  Last Vitals:  Vitals Value Taken Time  BP    Temp    Pulse    Resp    SpO2      Last Pain:  Vitals:   12/15/23 1028  PainSc: 0-No pain         Complications: No notable events documented.

## 2023-12-15 NOTE — Op Note (Signed)
 [facility] Patient Name: Rebecca Hartman Procedure Date: 12/15/2023 10:11 AM MRN: [id number] Date of Birth: 1945/10/13 Attending MD: Toribio Fortune , , [id number] CSN: [id number] Age: 78 Admit Type: Outpatient Procedure:                Upper GI endoscopy Indications:              Follow-up of esophageal varices Providers:                Toribio Fortune, Crystal Page, Daphne Mulch                            Technician, Technician Referring MD:              Medicines:                Monitored Anesthesia Care Complications:            No immediate complications. Estimated Blood Loss:     Estimated blood loss: none. Procedure:                Pre-Anesthesia Assessment:                           - Prior to the procedure, a History and Physical                            was performed, and patient medications, allergies                            and sensitivities were reviewed. The patient's                            tolerance of previous anesthesia was reviewed.                           - The risks and benefits of the procedure and the                            sedation options and risks were discussed with the                            patient. All questions were answered and informed                            consent was obtained.                           - ASA Grade Assessment: III - A patient with severe                            systemic disease.                           After obtaining informed consent, the endoscope was                            passed under direct vision. Throughout the  procedure, the patient's blood pressure, pulse, and                            oxygen saturations were monitored continuously. The                            [id number] ([name] was introduced through the                            mouth, and advanced to the second part of duodenum.                            The upper GI endoscopy was accomplished  without                            difficulty. The patient tolerated the procedure                            well. Scope In: 10:32:55 AM Scope Out: 10:35:45 AM Total Procedure Duration: 0 hours 2 minutes 50 seconds  Findings:      Grade I varices were found in the lower third of the esophagus.      A 5 cm hiatal hernia was present.      Striped mildly erythematous mucosa without bleeding was found in the       gastric antrum.      The examined duodenum was normal. Impression:               - Grade I esophageal varices.                           - 5 cm hiatal hernia.                           - Erythematous mucosa in the antrum.                           - Normal examined duodenum.                           - No specimens collected. Moderate Sedation:      Per Anesthesia Care Recommendation:           - Discharge patient to home (ambulatory).                           - Resume previous diet.                           - Continue present medications.                           - Repeat upper endoscopy in 2 years for                            surveillance. Procedure Code(s):        --- Professional ---  56764, Esophagogastroduodenoscopy, flexible,                            transoral; diagnostic, including collection of                            specimen(s) by brushing or washing, when performed                            (separate procedure) Diagnosis Code(s):        --- Professional ---                           I85.00, Esophageal varices without bleeding                           K44.9, Diaphragmatic hernia without obstruction or                            gangrene                           K31.89, Other diseases of stomach and duodenum CPT copyright 2022 American [employer] Association. All rights reserved. The codes documented in this report are preliminary and upon coder review may  be revised to meet current compliance requirements. Toribio Fortune,  MD Toribio Fortune,  12/15/2023 10:41:48 AM This report has been signed electronically. Number of Addenda: 0

## 2023-12-15 NOTE — Discharge Instructions (Signed)
 You are being discharged to home.  Resume your previous diet.  Continue your present medications.  Your physician has recommended a repeat upper endoscopy in two years for surveillance.

## 2023-12-15 NOTE — H&P (Signed)
 Rebecca Hartman is an 78 y.o. female.   Chief Complaint: screening esophageal varices HPI: Rebecca Hartman is a 78 y.o. female with past medical history of coronary artery disease status post stent placement on Plavix , cirrhosis possibly due to NASH c/b non bleeding EV, psoriatic arthritis, hypertension, fibromyalgia, hyperlipidemia, hypothyroidism, type 2 diabetes, osteoporosis and dementia, coming for variceal esophageal screening.  The patient denies having any nausea, vomiting, fever, chills, hematochezia, melena, hematemesis, abdominal distention, abdominal pain, diarrhea, jaundice, pruritus or weight loss.  Past Medical History:  Diagnosis Date   Anemia    Anxiety    Arthritis    Cataract    Chronic back pain    Coronary artery disease    Multivessel disease, status post DES to mid LAD 05/2020   Essential hypertension    Fibromyalgia    Hiatal hernia    History of blood transfusion    Hypercholesteremia    Hypothyroidism    Osteoporosis    Type 2 diabetes mellitus (HCC)     Past Surgical History:  Procedure Laterality Date   ABDOMINAL HYSTERECTOMY     BACK SURGERY     C-EYE SURGERY PROCEDURE     CARPAL TUNNEL RELEASE     CATARACT EXTRACTION     CHOLECYSTECTOMY     COLONOSCOPY  08/06/06 [facility]   COLONOSCOPY WITH PROPOFOL  N/A 09/18/2021   Procedure: COLONOSCOPY WITH PROPOFOL ;  Surgeon: Eartha Angelia Sieving, MD;  Location: [facility];  Service: Gastroenterology;  Laterality: N/A;  945   CORONARY IMAGING/OCT N/A 05/24/2020   Procedure: INTRAVASCULAR IMAGING/OCT;  Surgeon: Verlin Lonni BIRCH, MD;  Location: [facility];  Service: Cardiovascular;  Laterality: N/A;   CORONARY PRESSURE/FFR STUDY N/A 05/24/2020   Procedure: INTRAVASCULAR PRESSURE WIRE/FFR STUDY;  Surgeon: Verlin Lonni BIRCH, MD;  Location: [facility];  Service: Cardiovascular;  Laterality: N/A;   CORONARY STENT INTERVENTION  05/24/2020   CORONARY STENT INTERVENTION N/A 05/24/2020    Procedure: CORONARY STENT INTERVENTION;  Surgeon: Verlin Lonni BIRCH, MD;  Location: [facility];  Service: Cardiovascular;  Laterality: N/A;   DENTAL SURGERY  04/2015   reconstructive surgery lower   ESOPHAGOGASTRODUODENOSCOPY  10/31/2010   Procedure: ESOPHAGOGASTRODUODENOSCOPY (EGD);  Surgeon: Claudis RAYMOND Rivet, MD;  Location: [facility];  Service: Endoscopy;  Laterality: N/A;   ESOPHAGOGASTRODUODENOSCOPY  03/05/2011   Procedure: ESOPHAGOGASTRODUODENOSCOPY (EGD);  Surgeon: Claudis RAYMOND Rivet, MD;  Location: [facility];  Service: Endoscopy;  Laterality: N/A;  12:00   ESOPHAGOGASTRODUODENOSCOPY  04/28/2012   Procedure: ESOPHAGOGASTRODUODENOSCOPY (EGD);  Surgeon: Claudis RAYMOND Rivet, MD;  Location: [facility];  Service: Endoscopy;  Laterality: N/A;  140-moved to 10:30 Ann notified pt   ESOPHAGOGASTRODUODENOSCOPY (EGD) WITH PROPOFOL  N/A 09/18/2021   Procedure: ESOPHAGOGASTRODUODENOSCOPY (EGD) WITH PROPOFOL ;  Surgeon: Eartha Angelia Sieving, MD;  Location: [facility];  Service: Gastroenterology;  Laterality: N/A;   EYE SURGERY     LEFT HEART CATH AND CORONARY ANGIOGRAPHY N/A 05/24/2020   Procedure: LEFT HEART CATH AND CORONARY ANGIOGRAPHY;  Surgeon: Verlin Lonni BIRCH, MD;  Location: [facility];  Service: Cardiovascular;  Laterality: N/A;   TONSILLECTOMY     TOTAL HIP REVISION     UPPER GASTROINTESTINAL ENDOSCOPY  10/03/2010   EGD DILATN PYLORIC CHANNEL   UPPER GASTROINTESTINAL ENDOSCOPY  07/08/2010   UPPER GASTROINTESTINAL ENDOSCOPY  08/06/06   [facility]   UPPER GASTROINTESTINAL ENDOSCOPY  06/29/2008   EGD    Family History  Problem Relation Age of Onset  Diabetes Mother    Stroke Mother    Hyperlipidemia Mother    Hypertension Mother    Stroke Father    Heart disease Father    Hyperlipidemia Father    Hypertension Father    Healthy Son    Social History:  reports that she has never smoked. She has never used smokeless tobacco. She reports that she does not  drink alcohol and does not use drugs.  Allergies:  Allergies  Allergen Reactions   Shellfish Allergy Itching and Nausea And Vomiting   Tussigon [Hydrocodone  Bit-Homatrop Mbr]     Per patient's husband , this medication caused confusion.   Hydrocodone  Itching   Glyburide     Sudden Drop in Blood Sugars   Levofloxacin Itching   Zestril  [Lisinopril ] Other (See Comments)    SEVERE DROP IN PRESSURE   Morphine Itching and Nausea Only    Medications Prior to Admission  Medication Sig Dispense Refill   aluminum-magnesium hydroxide 200-200 MG/5ML suspension Take by mouth every 6 (six) hours as needed for indigestion. 15-30 ml prn     ascorbic acid (VITAMIN C) 500 MG tablet Take 500 mg by mouth daily.     atorvastatin  (LIPITOR) 40 MG tablet Take 40 mg by mouth daily.     calcium  carbonate (TUMS - DOSED IN MG ELEMENTAL CALCIUM ) 500 MG chewable tablet Chew 1-2 tablets by mouth 3 (three) times daily as needed for indigestion or heartburn.     cholecalciferol (VITAMIN D3) 25 MCG (1000 UNIT) tablet Take 1,000 Units by mouth in the morning.     diltiazem  (CARDIZEM  CD) 120 MG 24 hr capsule Take 1 capsule (120 mg total) by mouth daily. (Patient taking differently: Take 120 mg by mouth every evening.) 90 capsule 3   escitalopram  (LEXAPRO ) 10 MG tablet Take 10 mg by mouth as needed.     FEROSUL 325 (65 Fe) MG tablet Take 325 mg by mouth daily with breakfast. Tues, Thurs, and Sat.     fluconazole (DIFLUCAN) 100 MG tablet Take 100 mg by mouth as needed.     furosemide  (LASIX ) 20 MG tablet Take 1 tablet (20 mg total) by mouth daily. 30 tablet 3   isosorbide  mononitrate (IMDUR ) 30 MG 24 hr tablet TAKE 1 TABLET BY MOUTH ONCE DAILY. 90 tablet 4   lactulose  (CHRONULAC ) 10 GM/15ML solution Take 30 mLs (20 g total) by mouth daily. 1892 mL 4   levothyroxine  (SYNTHROID , LEVOTHROID) 50 MCG tablet Take 50 mcg by mouth daily before breakfast.     metoprolol  tartrate (LOPRESSOR ) 50 MG tablet TAKE (1) TABLET BY MOUTH  TWICE DAILY. 180 tablet 0   mupirocin  cream (BACTROBAN ) 2 % Apply 1 Application topically as needed.     pantoprazole  (PROTONIX ) 40 MG tablet Take 40 mg by mouth in the morning.     Polyethyl Glycol-Propyl Glycol (SYSTANE) 0.4-0.3 % SOLN Apply 1-2 drops to eye 4 (four) times daily as needed (dry/irritated eyes.).     potassium chloride  (KLOR-CON ) 10 MEQ tablet Take 10 mEq by mouth daily.     temazepam  (RESTORIL ) 30 MG capsule Take 30 mg by mouth at bedtime.     Vitamin A 2400 MCG (8000 UT) CAPS Take 2,400 mcg by mouth in the morning.     vitamin B-12 (CYANOCOBALAMIN ) 1000 MCG tablet Take 1,000 mcg by mouth 3 (three) times a week.     acetaminophen  (TYLENOL ) 500 MG tablet Take 500-1,000 mg by mouth every 6 (six) hours as needed (pain).  JARDIANCE  25 MG TABS tablet Take 25 mg by mouth daily.     nitroGLYCERIN  (NITROSTAT ) 0.4 MG SL tablet PLACE 1 TAB UNDER TONGUE EVERY 5 MIN IF NEEDED FOR CHEST PAIN. MAY USE 3 TIMES.NO RELIEF CALL 911. 25 tablet 3    Results for orders placed or performed during the hospital encounter of 12/15/23 (from the past 48 hours)  Glucose, capillary     Status: None   Collection Time: 12/15/23  9:08 AM  Result Value Ref Range   Glucose-Capillary 76 70 - 99 mg/dL    Comment: Glucose reference range applies only to samples taken after fasting for at least 8 hours.   No results found.  Review of Systems  All other systems reviewed and are negative.   Blood pressure (!) 188/71, temperature 97.6 F (36.4 C), resp. rate 16, SpO2 98%. Physical Exam  GENERAL: The patient is AO x3, in no acute distress. HEENT: Head is normocephalic and atraumatic. EOMI are intact. Mouth is well hydrated and without lesions. NECK: Supple. No masses LUNGS: Clear to auscultation. No presence of rhonchi/wheezing/rales. Adequate chest expansion HEART: RRR, normal s1 and s2. ABDOMEN: Soft, nontender, no guarding, no peritoneal signs, and nondistended. BS +. No masses. EXTREMITIES: Without  any cyanosis, clubbing, rash, lesions or edema. NEUROLOGIC: AOx3, no focal motor deficit. SKIN: no jaundice, no rashes  Assessment/Plan Rebecca Hartman is a 78 y.o. female with past medical history of coronary artery disease status post stent placement on Plavix , cirrhosis possibly due to NASH c/b non bleeding EV, psoriatic arthritis, hypertension, fibromyalgia, hyperlipidemia, hypothyroidism, type 2 diabetes, osteoporosis and dementia, coming for variceal esophageal screening.  Will proceed with EGD.  Toribio Eartha Flavors, MD 12/15/2023, 10:11 AM

## 2023-12-15 NOTE — Anesthesia Preprocedure Evaluation (Signed)
 Anesthesia Evaluation  Patient identified by MRN, date of birth, ID band Patient awake    Reviewed: Allergy & Precautions, H&P , NPO status , Patient's Chart, lab work & pertinent test results, reviewed documented beta blocker date and time   Airway Mallampati: II  TM Distance: >3 FB Neck ROM: full    Dental no notable dental hx.    Pulmonary neg pulmonary ROS   Pulmonary exam normal breath sounds clear to auscultation       Cardiovascular Exercise Tolerance: Good hypertension, + angina  + CAD   Rhythm:regular Rate:Normal     Neuro/Psych   Anxiety      Neuromuscular disease  negative psych ROS   GI/Hepatic Neg liver ROS, hiatal hernia,,,  Endo/Other  diabetesHypothyroidism    Renal/GU negative Renal ROS  negative genitourinary   Musculoskeletal   Abdominal   Peds  Hematology  (+) Blood dyscrasia, anemia   Anesthesia Other Findings   Reproductive/Obstetrics negative OB ROS                              Anesthesia Physical Anesthesia Plan  ASA: 3  Anesthesia Plan: General   Post-op Pain Management:    Induction:   PONV Risk Score and Plan: Propofol  infusion  Airway Management Planned:   Additional Equipment:   Intra-op Plan:   Post-operative Plan:   Informed Consent: I have reviewed the patients History and Physical, chart, labs and discussed the procedure including the risks, benefits and alternatives for the proposed anesthesia with the patient or authorized representative who has indicated his/her understanding and acceptance.     Dental Advisory Given  Plan Discussed with: CRNA  Anesthesia Plan Comments:         Anesthesia Quick Evaluation

## 2023-12-15 NOTE — Anesthesia Postprocedure Evaluation (Signed)
 Anesthesia Post Note  Patient: Rebecca Hartman  Procedure(s) Performed: EGD (ESOPHAGOGASTRODUODENOSCOPY)  Patient location during evaluation: Phase II Anesthesia Type: MAC Level of consciousness: awake Pain management: pain level controlled Vital Signs Assessment: post-procedure vital signs reviewed and stable Respiratory status: spontaneous breathing and respiratory function stable Cardiovascular status: blood pressure returned to baseline and stable Postop Assessment: no headache and no apparent nausea or vomiting Anesthetic complications: no Comments: Late entry   No notable events documented.   Last Vitals:  Vitals:   12/15/23 0929 12/15/23 1044  BP: (!) 188/71 (!) 117/44  Pulse:  (!) 51  Resp: 16 12  Temp: 36.4 C 37.1 C  SpO2: 98% 94%    Last Pain:  Vitals:   12/15/23 1044  TempSrc: Oral  PainSc: 0-No pain                 Yvonna JINNY Bosworth

## 2023-12-15 NOTE — Progress Notes (Unsigned)
 [name] [address], [place] [address]   CLINIC:  Medical Oncology/Hematology  PCP:  Rebecca Norleen PEDLAR, MD [address] [place] [address] [phone]    *** RESUME PREP BELOW ***    REASON FOR VISIT:  Follow-up for anemia and thrombocytopenia  PRIOR THERAPY: None  CURRENT THERAPY: Surveillance  INTERVAL HISTORY:   Rebecca Hartman 78 y.o. female returns for routine follow-up of anemia and thrombocytopenia.  She was last seen by [name] PA-C on 06/02/2023.   Her husband reports that she has been diagnosed with early signs of dementia, and has good days and bad days with her cognitive deficits.  At today's visit, she reports feeling fair apart from fatigue.   She received Feraheme  x 2 in March 2025.  She reports feeling some improved energy after IV iron. She denies any hematemesis, hematochezia, melena, or hematuria.  She continues to report nosebleeds, but these have decreased in frequency and intensity since last visit.  She has previously had cauterization with ENT in [place]; has not yet reestablished care with ENT.  She admits to easy bruising but denies petechial rash.     She has some residual fatigue.   She has occasional chest pain (followed by cardiology).   She has occasional lightheadedness and poor balance.  She feels cold all the time.   She denies any recent headaches, palpitations, pica, or syncope. She continues to follow with GI for liver cirrhosis and intermittent LUQ abdominal pain.  At last visit, she had significant fluid retention, but has lost 20 pounds in fluid weight after being placed on diuretic therapy.  No recent or recurrent infections.  She has not noticed any new lumps or bumps.   She denies any B symptoms such as fever, chills, or night sweats.  She has 50% energy and 50% appetite.    ASSESSMENT & PLAN:  1.  Thrombocytopenia and leukopenia (cirrhosis/splenomegaly), with history of drug-induced  pancytopenia (resolved) - Initial onset of pancytopenia in November 2022, which improved after methotrexate and Plaquenil  were held.  (Followed by [name] / rheumatology for psoriatic arthritis) - Additional workup revealed B12 deficiency, iron deficiency, and Hemoccult positive stool. - Normal flow cytometry and SPEP. - Follows with Dr. [name] for HEPATIC CIRRHOSIS - US  abdomen (06/23/2023): SPLENOMEGALY (16 cm maximum) - Labs today (08/04/2023): Hgb 11.8/MCV 102.1, platelets 92, WBC 4.2/normal differential - No B symptoms or frequent infections.      - Mild pancytopenia improved after methotrexate was discontinued, but she has persistent moderate thrombocytopenia/mild leukopenia which is likely secondary to liver disease/splenomegaly and possible autoimmune thrombocytopenia versus medication effect. - PLAN: DIFFERENTIAL DIAGNOSIS and work-up for persistent thrombocytopenia includes: Thrombocytopenia/leukopenia related to LIVER DISEASE AND SPLENOMEGALY: Continue surveillance and GI follow-up Autoimmune thrombocytopenia: We will consider trial of steroids if platelets <50 MDS or other bone marrow infiltrative process: No indication for bone marrow biopsy at this time.  Would consider bone marrow biopsy if she has worsening cytopenias or development of B symptoms.  Would consider CT CAP in the presence of B symptoms due to increased risk of lymphoproliferative disorders in patients with autoimmune disease. - Repeat labs and RTC in 4 months.   2.  Anemia secondary to IDA + CKD stage II - History of GI bleed in 2012 due to stomach ulcer, required blood transfusions and iron infusions. - Hemoccult stool POSITIVE x3 in February 2023 - EGD (09/18/2021): Grade 1 esophageal varices, portal hypertensive gastropathy -  Colonoscopy (09/18/2021): Congested mucosa in ascending colon and rectum corresponding with portal colopathy - Taking ferrous sulfate  325 mg 3 times weekly -  Hematology workup (04/30/2023): Vitamin B12 deficiency has been addressed (see below).  Normal folate 6.6  Ferritin 81, iron saturation 28% Reticulocytes 2.8%.  LDH mildly elevated 274.  Bilirubin 2.6 in the setting of liver cirrhosis.  RBC morphology unremarkable. Evidence of CKD stage II, although creatinine is normal (0.50), Cystatin C is elevated at 1.43, with calculated GFR around 64 (using 2021 CKD-EPI Cystatin C-creatinine equation)  Previously checked SPEP and flow cytometry were normal. - Received Feraheme  x 2 (with PREMEDS) in March 2025  - Reports intermittent nosebleeds, worse in the winter.  She was previously seen by ENT and received intranasal cauterization last year. - No bright red blood per rectum, melena, or hematuria     - Labs today (08/04/2023): Hgb 11.8/MCV 102.1, ferritin 293, iron saturation 82%. - DIFFERENTIAL DIAGNOSIS favors anemia related to chronic disease/inflammation, functional iron deficiency, and early CKD stage II.  Macrocytosis due to liver cirrhosis. - PLAN: No further IV iron at this time.  Continue iron tablet 3 times weekly. - Labs and RTC in 4 months - Continue follow-up with GI   3.  Vitamin B12 deficiency - Labs from 05/14/2021 show normal vitamin B12 at 295, but elevated methylmalonic acid 938 - Vitamin B12 injection given on 06/04/2021 - Has been taking vitamin B12 1000 mcg daily since March  - Most recent labs (04/30/2023): Vitamin B12 1677/MMA 148 with folate 6.6.   - PLAN: Continue vitamin B12 1000 mcg, but decreased to EVERY OTHER DAY.     - Recheck vitamin B12 and folate in 6 months (August 2025)   4.  Psoriatic arthritis - Previously took Enbrel - She is not currently on any rheumatologic medications  5.  Other history - PMH: Type 2 diabetes mellitus, hypertension, CAD with stents, hypothyroidism, hyperlipidemia, fibromyalgia, psoriatic arthritis, osteoarthritis, LIVER CIRRHOSIS (Dr. 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  No tobacco, alcohol, or illicit drug use. - FAMILY: Mother had strokes.  Father had heart problems. Brother had prostate cancer.  PLAN SUMMARY:  >> Labs in 4 months = CBC/D, CMP, ferritin, iron/TIBC, B12, MMA >> OFFICE visit in 4 months (1 week after labs)     REVIEW OF SYSTEMS:   Review of Systems  Constitutional:  Positive for fatigue. Negative for appetite change, chills, diaphoresis, fever and unexpected weight change.  HENT:   Negative for lump/mass and nosebleeds.   Eyes:  Negative for eye problems.  Respiratory:  Positive for cough and shortness of breath (with exertion). Negative for hemoptysis.   Cardiovascular:  Positive for chest pain and palpitations. Negative for leg swelling.  Gastrointestinal:  Positive for abdominal pain (LUQ abdomainl pain, comes and goes, no obvious triggering or relieving factors, no association w/ food, following w/ GI), constipation and diarrhea. Negative for abdominal distention, blood in stool, nausea and vomiting.  Genitourinary:  Negative for hematuria.   Musculoskeletal:  Positive for arthralgias.  Neurological:  Positive for dizziness and numbness. Negative for headaches and light-headedness.  Hematological:  Does not bruise/bleed easily.     PHYSICAL EXAM:  ECOG PERFORMANCE STATUS: 2 - Symptomatic, <50% confined to bed  There were no vitals filed for this visit.  There were no vitals filed for this visit.  Physical Exam Constitutional:      Appearance: Normal appearance.  HENT:  Head: Normocephalic and atraumatic.  Cardiovascular:     Rate and Rhythm: Normal rate and regular rhythm.     Pulses: Normal pulses.     Heart sounds: Murmur heard.  Pulmonary:     Effort: Pulmonary effort is normal.     Breath sounds: Normal breath sounds.  Neurological:     General: No focal deficit present.     Mental Status: She is alert and oriented to person, place, and time.  Psychiatric:         Mood and Affect: Mood normal.        Behavior: Behavior normal.     PAST MEDICAL/SURGICAL HISTORY:  Past Medical History:  Diagnosis Date   Anemia    Anxiety    Arthritis    Cataract    Chronic back pain    Coronary artery disease    Multivessel disease, status post DES to mid LAD 05/2020   Essential hypertension    Fibromyalgia    Hiatal hernia    History of blood transfusion    Hypercholesteremia    Hypothyroidism    Osteoporosis    Type 2 diabetes mellitus (HCC)    Past Surgical History:  Procedure Laterality Date   ABDOMINAL HYSTERECTOMY     BACK SURGERY     C-EYE SURGERY PROCEDURE     CARPAL TUNNEL RELEASE     CATARACT EXTRACTION     CHOLECYSTECTOMY     COLONOSCOPY  08/06/06 FIELDS   COLONOSCOPY WITH PROPOFOL  N/A 09/18/2021   Procedure: COLONOSCOPY WITH PROPOFOL ;  Surgeon: Eartha Angelia Sieving, MD;  [facility];  Service: Gastroenterology;  Laterality: N/A;  945   CORONARY IMAGING/OCT N/A 05/24/2020   Procedure: INTRAVASCULAR IMAGING/OCT;  Surgeon: Verlin Lonni BIRCH, MD;  [facility];  Service: Cardiovascular;  Laterality: N/A;   CORONARY PRESSURE/FFR STUDY N/A 05/24/2020   Procedure: INTRAVASCULAR PRESSURE WIRE/FFR STUDY;  Surgeon: Verlin Lonni BIRCH, MD;  [facility];  Service: Cardiovascular;  Laterality: N/A;   CORONARY STENT INTERVENTION  05/24/2020   CORONARY STENT INTERVENTION N/A 05/24/2020   Procedure: CORONARY STENT INTERVENTION;  Surgeon: Verlin Lonni BIRCH, MD;  [facility];  Service: Cardiovascular;  Laterality: N/A;   DENTAL SURGERY  04/2015   reconstructive surgery lower   ESOPHAGOGASTRODUODENOSCOPY  10/31/2010   Procedure: ESOPHAGOGASTRODUODENOSCOPY (EGD);  Surgeon: Claudis RAYMOND Rivet, MD;  [facility]: [facility];  Service: Endoscopy;  Laterality: N/A;   ESOPHAGOGASTRODUODENOSCOPY  03/05/2011   Procedure: ESOPHAGOGASTRODUODENOSCOPY (EGD);  Surgeon: Claudis RAYMOND Rivet, MD;   [facility];  Service: Endoscopy;  Laterality: N/A;  12:00   ESOPHAGOGASTRODUODENOSCOPY  04/28/2012   Procedure: ESOPHAGOGASTRODUODENOSCOPY (EGD);  Surgeon: Claudis RAYMOND Rivet, MD;  [facility];  Service: Endoscopy;  Laterality: N/A;  140-moved to 10:30 Ann notified pt   ESOPHAGOGASTRODUODENOSCOPY (EGD) WITH PROPOFOL  N/A 09/18/2021   Procedure: ESOPHAGOGASTRODUODENOSCOPY (EGD) WITH PROPOFOL ;  Surgeon: Eartha Angelia Sieving, MD;  [facility];  Service: Gastroenterology;  Laterality: N/A;   EYE SURGERY     LEFT HEART CATH AND CORONARY ANGIOGRAPHY N/A 05/24/2020   Procedure: LEFT HEART CATH AND CORONARY ANGIOGRAPHY;  Surgeon: Verlin Lonni BIRCH, MD;  [facility];  Service: Cardiovascular;  Laterality: N/A;   TONSILLECTOMY     TOTAL HIP REVISION     UPPER GASTROINTESTINAL ENDOSCOPY  10/03/2010   EGD DILATN PYLORIC CHANNEL   UPPER GASTROINTESTINAL ENDOSCOPY  07/08/2010   UPPER GASTROINTESTINAL ENDOSCOPY  08/06/06  FIELDS   UPPER GASTROINTESTINAL ENDOSCOPY  06/29/2008   EGD    SOCIAL HISTORY:  Social History   Socioeconomic History   Marital status: Married    Spouse name: Not on file   Number of children: Not on file   Years of education: Not on file   Highest education level: Not on file  Occupational History   Occupation: retired  Tobacco Use   Smoking status: Never   Smokeless tobacco: Never  Vaping Use   Vaping status: Never Used  Substance and Sexual Activity   Alcohol use: No    Alcohol/week: 0.0 standard drinks of alcohol   Drug use: No   Sexual activity: Yes    Birth control/protection: Post-menopausal  Other Topics Concern   Not on file  Social History Narrative   Not on file   Social Drivers of Health   Financial Resource Strain: Not on file  Food Insecurity: Not on file  Transportation Needs: Not on file  Physical Activity: Not on file  Stress: Not on file  Social Connections: Not on file  Intimate  Partner Violence: Not on file    FAMILY HISTORY:  Family History  Problem Relation Age of Onset   Diabetes Mother    Stroke Mother    Hyperlipidemia Mother    Hypertension Mother    Stroke Father    Heart disease Father    Hyperlipidemia Father    Hypertension Father    Healthy Son     CURRENT MEDICATIONS:  Outpatient Encounter Medications as of 12/17/2023  Medication Sig Note   acetaminophen  (TYLENOL ) 500 MG tablet Take 500-1,000 mg by mouth every 6 (six) hours as needed (pain).    aluminum-magnesium hydroxide 200-200 MG/5ML suspension Take by mouth every 6 (six) hours as needed for indigestion. 15-30 ml prn    ascorbic acid (VITAMIN C) 500 MG tablet Take 500 mg by mouth daily.    atorvastatin  (LIPITOR) 40 MG tablet Take 40 mg by mouth daily.    calcium  carbonate (TUMS - DOSED IN MG ELEMENTAL CALCIUM ) 500 MG chewable tablet Chew 1-2 tablets by mouth 3 (three) times daily as needed for indigestion or heartburn.    cholecalciferol (VITAMIN D3) 25 MCG (1000 UNIT) tablet Take 1,000 Units by mouth in the morning.    diltiazem  (CARDIZEM  CD) 120 MG 24 hr capsule Take 1 capsule (120 mg total) by mouth daily. (Patient taking differently: Take 120 mg by mouth every evening.)    escitalopram  (LEXAPRO ) 10 MG tablet Take 10 mg by mouth as needed.    FEROSUL 325 (65 Fe) MG tablet Take 325 mg by mouth daily with breakfast. Tues, Thurs, and Sat.    fluconazole (DIFLUCAN) 100 MG tablet Take 100 mg by mouth as needed.    furosemide  (LASIX ) 20 MG tablet Take 1 tablet (20 mg total) by mouth daily. 07/23/2023: Takes as needed    isosorbide  mononitrate (IMDUR ) 30 MG 24 hr tablet TAKE 1 TABLET BY MOUTH ONCE DAILY.    JARDIANCE  25 MG TABS tablet Take 25 mg by mouth daily.    lactulose  (CHRONULAC ) 10 GM/15ML solution Take 30 mLs (20 g total) by mouth daily. 08/17/2023: Takes rarely.   levothyroxine  (SYNTHROID , LEVOTHROID) 50 MCG tablet Take 50 mcg by mouth daily before breakfast.    metoprolol  tartrate  (LOPRESSOR ) 50 MG tablet TAKE (1) TABLET BY MOUTH TWICE DAILY.    mupirocin  cream (BACTROBAN ) 2 % Apply 1 Application topically as needed.    nitroGLYCERIN  (NITROSTAT ) 0.4 MG SL tablet  PLACE 1 TAB UNDER TONGUE EVERY 5 MIN IF NEEDED FOR CHEST PAIN. MAY USE 3 TIMES.NO RELIEF CALL 911.    pantoprazole  (PROTONIX ) 40 MG tablet Take 40 mg by mouth in the morning.    Polyethyl Glycol-Propyl Glycol (SYSTANE) 0.4-0.3 % SOLN Apply 1-2 drops to eye 4 (four) times daily as needed (dry/irritated eyes.).    potassium chloride  (KLOR-CON ) 10 MEQ tablet Take 10 mEq by mouth daily.    temazepam  (RESTORIL ) 30 MG capsule Take 30 mg by mouth at bedtime.    Vitamin A 2400 MCG (8000 UT) CAPS Take 2,400 mcg by mouth in the morning.    vitamin B-12 (CYANOCOBALAMIN ) 1000 MCG tablet Take 1,000 mcg by mouth 3 (three) times a week.    No facility-administered encounter medications on file as of 12/17/2023.    ALLERGIES:  Allergies  Allergen Reactions   Shellfish Allergy Itching and Nausea And Vomiting   Tussigon [Hydrocodone  Bit-Homatrop Mbr]     Per patient's husband , this medication caused confusion.   Hydrocodone  Itching   Glyburide     Sudden Drop in Blood Sugars   Levofloxacin Itching   Zestril  [Lisinopril ] Other (See Comments)    SEVERE DROP IN PRESSURE   Morphine Itching and Nausea Only    LABORATORY DATA:  I have reviewed the labs as listed.  CBC    Component Value Date/Time   WBC 7.6 12/08/2023 1330   RBC 3.75 (L) 12/08/2023 1330   HGB 13.0 12/08/2023 1330   HGB 11.5 11/27/2023 1146   HCT 37.9 12/08/2023 1330   HCT 32.4 (L) 11/27/2023 1146   PLT 100 (L) 12/08/2023 1330   PLT 86 (LL) 11/27/2023 1146   MCV 101.1 (H) 12/08/2023 1330   MCV 96 11/27/2023 1146   MCH 34.7 (H) 12/08/2023 1330   MCHC 34.3 12/08/2023 1330   RDW 16.6 (H) 12/08/2023 1330   RDW 13.8 11/27/2023 1146   LYMPHSABS 1.0 12/08/2023 1330   LYMPHSABS 0.9 08/20/2022 1412   MONOABS 0.6 12/08/2023 1330   EOSABS 0.3 12/08/2023  1330   EOSABS 0.2 08/20/2022 1412   BASOSABS 0.1 12/08/2023 1330   BASOSABS 0.0 08/20/2022 1412      Latest Ref Rng & Units 12/08/2023    1:30 PM 08/28/2023    2:03 PM 08/04/2023   12:55 PM  CMP  Glucose 70 - 99 mg/dL 841  893  837   BUN 8 - 23 mg/dL 7  7  7    Creatinine 0.44 - 1.00 mg/dL 9.30  9.37  9.44   Sodium 135 - 145 mmol/L 139  139  132   Potassium 3.5 - 5.1 mmol/L 3.0  3.7  3.7   Chloride 98 - 111 mmol/L 112  108  105   CO2 22 - 32 mmol/L 20  19  19    Calcium  8.9 - 10.3 mg/dL 8.3  8.0  8.1   Total Protein 6.5 - 8.1 g/dL 5.8  5.2  5.1   Total Bilirubin 0.0 - 1.2 mg/dL 2.3  1.5  2.0   Alkaline Phos 38 - 126 U/L 86  100  78   AST 15 - 41 U/L 28  36  33   ALT 0 - 44 U/L 13  18  20      DIAGNOSTIC IMAGING:  I have independently reviewed the relevant imaging and discussed with the patient.   WRAP UP:  All questions were answered. The patient knows to call the clinic with any problems, questions or concerns.  Medical  decision making: Moderate  Time spent on visit: I spent 20 minutes counseling the patient face to face. The total time spent in the appointment was 30 minutes and more than 50% was on counseling.  Pleasant CHRISTELLA Barefoot, PA-C  12/15/23 10:05 PM

## 2023-12-17 ENCOUNTER — Inpatient Hospital Stay (HOSPITAL_BASED_OUTPATIENT_CLINIC_OR_DEPARTMENT_OTHER): Admitting: Physician Assistant

## 2023-12-17 ENCOUNTER — Encounter (HOSPITAL_COMMUNITY): Payer: Self-pay | Admitting: Gastroenterology

## 2023-12-17 VITALS — BP 136/74 | HR 57 | Temp 98.2°F | Resp 18

## 2023-12-17 DIAGNOSIS — E538 Deficiency of other specified B group vitamins: Secondary | ICD-10-CM | POA: Diagnosis not present

## 2023-12-17 DIAGNOSIS — E611 Iron deficiency: Secondary | ICD-10-CM | POA: Diagnosis not present

## 2023-12-17 DIAGNOSIS — D61818 Other pancytopenia: Secondary | ICD-10-CM | POA: Diagnosis not present

## 2023-12-17 DIAGNOSIS — D696 Thrombocytopenia, unspecified: Secondary | ICD-10-CM

## 2023-12-17 NOTE — Patient Instructions (Signed)
 [facility] at [facility] **VISIT SUMMARY & IMPORTANT INSTRUCTIONS **   You were seen today by [name] PA-C for your follow-up visit.    LOW PLATELETS & LOW WHITE BLOOD CELLS Your platelets today are low but stable.  Your white blood cells have improved and are currently normal. Your low platelets/WBCs are most likely related to your underlying liver cirrhosis and enlarged spleen.   IRON DEFICIENCY ANEMIA Your blood and iron levels look great! Please make an appointment with your ENT for treatment of your nosebleeds.  LABS: Return in 6 months for repeat labs  MEDICATIONS: Continue taking the following... - Iron tablet 3 days a week - Vitamin B12 tablet daily  FOLLOW-UP APPOINTMENT: Labs 6 months with office visit 1 week after  ** Thank you for trusting me with your healthcare!  I strive to provide all of my patients with quality care at each visit.  If you receive a survey for this visit, I would be so grateful to you for taking the time to provide feedback.  Thank you in advance!  ~ Winry Egnew                                        Dr. Mickiel Davonna [name], PA-C    Delon Hope, NP   - - - - - - - - - - - - - - - - - -      Thank you for choosing [facility] at [facility] to provide your oncology and hematology care.  To afford each patient quality time with our provider, please arrive at least 15 minutes before your scheduled appointment time.   If you have a lab appointment with the Cancer Center please come in thru the Main Entrance and check in at the main information desk.  You need to re-schedule your appointment should you arrive 10 or more minutes late.  We strive to give you quality time with our providers, and arriving late affects you and other patients whose appointments are after yours.  Also, if you no show three or more times for appointments you may be dismissed from the clinic at the  providers discretion.     Again, thank you for choosing [facility].  Our hope is that these requests will decrease the amount of time that you wait before being seen by our physicians.       _____________________________________________________________  Should you have questions after your visit to [facility], please contact our office at [phone] and follow the prompts.  Our office hours are 8:00 a.m. and 4:30 p.m. Monday - Friday.  Please note that voicemails left after 4:00 p.m. may not be returned until the following business day.  We are closed weekends and major holidays.  You do have access to a nurse 24-7, just call the main number to the clinic [phone] and do not press any options, hold on the line and a nurse will answer the phone.    For prescription refill requests, have your pharmacy contact our office and allow 72 hours.

## 2023-12-22 ENCOUNTER — Encounter: Payer: Self-pay | Admitting: Podiatry

## 2023-12-22 ENCOUNTER — Ambulatory Visit (INDEPENDENT_AMBULATORY_CARE_PROVIDER_SITE_OTHER): Admitting: Podiatry

## 2023-12-22 DIAGNOSIS — M79675 Pain in left toe(s): Secondary | ICD-10-CM

## 2023-12-22 DIAGNOSIS — B351 Tinea unguium: Secondary | ICD-10-CM | POA: Diagnosis not present

## 2023-12-22 DIAGNOSIS — E118 Type 2 diabetes mellitus with unspecified complications: Secondary | ICD-10-CM | POA: Diagnosis not present

## 2023-12-22 DIAGNOSIS — M79674 Pain in right toe(s): Secondary | ICD-10-CM

## 2023-12-22 NOTE — Progress Notes (Signed)
 This patient presents to the office with chief complaint of long thick nails and diabetic feet.   This patient says there are long thick painful nails.  These nails are painful walking and wearing shoes.    Patient is unable to  self treat his own nails . This patient presents  to the office today for treatment of the  long nails and a foot evaluation due to history of  diabetes.  She has coagulation defect due to plavix .  General Appearance  Alert, conversant and in no acute stress.  Vascular  Dorsalis pedis and posterior tibial  pulses are palpable  bilaterally.  Capillary return is within normal limits  bilaterally. Temperature is within normal limits  bilaterally.  Neurologic  Senn-Weinstein monofilament wire test diminished  bilaterally. Muscle power within normal limits bilaterally.  Nails Thick disfigured discolored nails with subungual debris  from hallux to fifth toes bilaterally. No evidence of bacterial infection or drainage bilaterally.  Orthopedic  No limitations of motion of motion feet .  No crepitus or effusions noted.  No bony pathology or digital deformities noted.  HAV  B/L.  Skin  normotropic skin with no porokeratosis noted bilaterally.  No signs of infections or ulcers noted.     Onychomycosis  Diabetes with no foot complications  Debride nails x 10.   RTC 3 months.   Cordella Bold DPM

## 2023-12-23 DIAGNOSIS — E1140 Type 2 diabetes mellitus with diabetic neuropathy, unspecified: Secondary | ICD-10-CM | POA: Diagnosis not present

## 2023-12-23 DIAGNOSIS — E039 Hypothyroidism, unspecified: Secondary | ICD-10-CM | POA: Diagnosis not present

## 2023-12-23 DIAGNOSIS — E782 Mixed hyperlipidemia: Secondary | ICD-10-CM | POA: Diagnosis not present

## 2023-12-23 DIAGNOSIS — E559 Vitamin D deficiency, unspecified: Secondary | ICD-10-CM | POA: Diagnosis not present

## 2023-12-29 DIAGNOSIS — Z23 Encounter for immunization: Secondary | ICD-10-CM | POA: Diagnosis not present

## 2023-12-29 DIAGNOSIS — L4050 Arthropathic psoriasis, unspecified: Secondary | ICD-10-CM | POA: Diagnosis not present

## 2023-12-29 DIAGNOSIS — M810 Age-related osteoporosis without current pathological fracture: Secondary | ICD-10-CM | POA: Diagnosis not present

## 2023-12-29 DIAGNOSIS — E039 Hypothyroidism, unspecified: Secondary | ICD-10-CM | POA: Diagnosis not present

## 2023-12-29 DIAGNOSIS — I2510 Atherosclerotic heart disease of native coronary artery without angina pectoris: Secondary | ICD-10-CM | POA: Diagnosis not present

## 2023-12-29 DIAGNOSIS — Z0000 Encounter for general adult medical examination without abnormal findings: Secondary | ICD-10-CM | POA: Diagnosis not present

## 2023-12-29 DIAGNOSIS — E782 Mixed hyperlipidemia: Secondary | ICD-10-CM | POA: Diagnosis not present

## 2023-12-29 DIAGNOSIS — E559 Vitamin D deficiency, unspecified: Secondary | ICD-10-CM | POA: Diagnosis not present

## 2023-12-29 DIAGNOSIS — E876 Hypokalemia: Secondary | ICD-10-CM | POA: Diagnosis not present

## 2023-12-29 DIAGNOSIS — D509 Iron deficiency anemia, unspecified: Secondary | ICD-10-CM | POA: Diagnosis not present

## 2023-12-29 DIAGNOSIS — E1140 Type 2 diabetes mellitus with diabetic neuropathy, unspecified: Secondary | ICD-10-CM | POA: Diagnosis not present

## 2024-01-13 ENCOUNTER — Encounter (INDEPENDENT_AMBULATORY_CARE_PROVIDER_SITE_OTHER): Payer: Self-pay | Admitting: Gastroenterology

## 2024-01-14 ENCOUNTER — Encounter (INDEPENDENT_AMBULATORY_CARE_PROVIDER_SITE_OTHER): Payer: Self-pay | Admitting: *Deleted

## 2024-03-02 ENCOUNTER — Encounter: Payer: Self-pay | Admitting: Podiatry

## 2024-03-02 ENCOUNTER — Ambulatory Visit: Admitting: Podiatry

## 2024-03-02 DIAGNOSIS — B351 Tinea unguium: Secondary | ICD-10-CM

## 2024-03-02 DIAGNOSIS — M79675 Pain in left toe(s): Secondary | ICD-10-CM | POA: Diagnosis not present

## 2024-03-02 DIAGNOSIS — M79674 Pain in right toe(s): Secondary | ICD-10-CM

## 2024-03-02 DIAGNOSIS — E118 Type 2 diabetes mellitus with unspecified complications: Secondary | ICD-10-CM

## 2024-03-02 NOTE — Progress Notes (Signed)
 This patient presents to the office with chief complaint of long thick nails and diabetic feet.   This patient says there are long thick painful nails.  These nails are painful walking and wearing shoes.    Patient is unable to  self treat his own nails . This patient presents  to the office today for treatment of the  long nails and a foot evaluation due to history of  diabetes.  She has coagulation defect due to plavix .  General Appearance  Alert, conversant and in no acute stress.  Vascular  Dorsalis pedis and posterior tibial  pulses are palpable  bilaterally.  Capillary return is within normal limits  bilaterally. Temperature is within normal limits  bilaterally.  Neurologic  Senn-Weinstein monofilament wire test diminished  bilaterally. Muscle power within normal limits bilaterally.  Nails Thick disfigured discolored nails with subungual debris  from hallux to fifth toes bilaterally. No evidence of bacterial infection or drainage bilaterally.  Orthopedic  No limitations of motion of motion feet .  No crepitus or effusions noted.  No bony pathology or digital deformities noted.  HAV  B/L.  Skin  normotropic skin with no porokeratosis noted bilaterally.  No signs of infections or ulcers noted.     Onychomycosis  Diabetes with no foot complications  Debride nails x 10.   RTC 3 months.   Cordella Bold DPM

## 2024-03-04 DIAGNOSIS — I10 Essential (primary) hypertension: Secondary | ICD-10-CM | POA: Diagnosis not present

## 2024-03-04 DIAGNOSIS — K219 Gastro-esophageal reflux disease without esophagitis: Secondary | ICD-10-CM | POA: Diagnosis not present

## 2024-03-04 DIAGNOSIS — L4050 Arthropathic psoriasis, unspecified: Secondary | ICD-10-CM | POA: Diagnosis not present

## 2024-03-04 DIAGNOSIS — M79641 Pain in right hand: Secondary | ICD-10-CM | POA: Diagnosis not present

## 2024-03-04 DIAGNOSIS — R778 Other specified abnormalities of plasma proteins: Secondary | ICD-10-CM | POA: Diagnosis not present

## 2024-03-04 DIAGNOSIS — M6281 Muscle weakness (generalized): Secondary | ICD-10-CM | POA: Diagnosis not present

## 2024-03-04 DIAGNOSIS — K7460 Unspecified cirrhosis of liver: Secondary | ICD-10-CM | POA: Diagnosis not present

## 2024-03-04 DIAGNOSIS — E1140 Type 2 diabetes mellitus with diabetic neuropathy, unspecified: Secondary | ICD-10-CM | POA: Diagnosis not present

## 2024-03-04 DIAGNOSIS — D509 Iron deficiency anemia, unspecified: Secondary | ICD-10-CM | POA: Diagnosis not present

## 2024-03-04 DIAGNOSIS — E43 Unspecified severe protein-calorie malnutrition: Secondary | ICD-10-CM | POA: Diagnosis not present

## 2024-03-04 DIAGNOSIS — G4700 Insomnia, unspecified: Secondary | ICD-10-CM | POA: Diagnosis not present

## 2024-03-04 DIAGNOSIS — F411 Generalized anxiety disorder: Secondary | ICD-10-CM | POA: Diagnosis not present

## 2024-05-11 ENCOUNTER — Ambulatory Visit: Admitting: Podiatry

## 2024-06-14 ENCOUNTER — Inpatient Hospital Stay

## 2024-06-21 ENCOUNTER — Ambulatory Visit: Admitting: Physician Assistant
